# Patient Record
Sex: Male | Born: 1945
Health system: Southern US, Community
[De-identification: ages and names within clinical notes are randomized; demographics above are authoritative.]

## PROBLEM LIST (undated history)

## (undated) DIAGNOSIS — Z8619 Personal history of other infectious and parasitic diseases: Secondary | ICD-10-CM

## (undated) DIAGNOSIS — F429 Obsessive-compulsive disorder, unspecified: Secondary | ICD-10-CM

## (undated) DIAGNOSIS — Z972 Presence of dental prosthetic device (complete) (partial): Secondary | ICD-10-CM

## (undated) DIAGNOSIS — R410 Disorientation, unspecified: Secondary | ICD-10-CM

## (undated) DIAGNOSIS — F329 Major depressive disorder, single episode, unspecified: Secondary | ICD-10-CM

## (undated) DIAGNOSIS — G2581 Restless legs syndrome: Secondary | ICD-10-CM

## (undated) DIAGNOSIS — M1990 Unspecified osteoarthritis, unspecified site: Secondary | ICD-10-CM

## (undated) DIAGNOSIS — F32A Depression, unspecified: Secondary | ICD-10-CM

## (undated) DIAGNOSIS — Z8719 Personal history of other diseases of the digestive system: Secondary | ICD-10-CM

## (undated) DIAGNOSIS — R413 Other amnesia: Secondary | ICD-10-CM

## (undated) DIAGNOSIS — M5126 Other intervertebral disc displacement, lumbar region: Secondary | ICD-10-CM

## (undated) DIAGNOSIS — K219 Gastro-esophageal reflux disease without esophagitis: Secondary | ICD-10-CM

## (undated) DIAGNOSIS — G629 Polyneuropathy, unspecified: Secondary | ICD-10-CM

## (undated) DIAGNOSIS — Z974 Presence of external hearing-aid: Secondary | ICD-10-CM

## (undated) DIAGNOSIS — E785 Hyperlipidemia, unspecified: Secondary | ICD-10-CM

## (undated) DIAGNOSIS — F319 Bipolar disorder, unspecified: Secondary | ICD-10-CM

## (undated) DIAGNOSIS — F29 Unspecified psychosis not due to a substance or known physiological condition: Secondary | ICD-10-CM

## (undated) DIAGNOSIS — J439 Emphysema, unspecified: Secondary | ICD-10-CM

## (undated) HISTORY — PX: ESOPHAGOGASTRODUODENOSCOPY: SHX1529

## (undated) HISTORY — DX: Personal history of other infectious and parasitic diseases: Z86.19

## (undated) HISTORY — PX: OTHER SURGICAL HISTORY: SHX169

## (undated) HISTORY — DX: Gastro-esophageal reflux disease without esophagitis: K21.9

## (undated) HISTORY — DX: Obsessive-compulsive disorder, unspecified: F42.9

## (undated) HISTORY — PX: BACK SURGERY: SHX140

## (undated) HISTORY — DX: Disorientation, unspecified: R41.0

## (undated) HISTORY — DX: Restless legs syndrome: G25.81

## (undated) HISTORY — DX: Unspecified psychosis not due to a substance or known physiological condition: F29

## (undated) HISTORY — DX: Other amnesia: R41.3

## (undated) HISTORY — DX: Polyneuropathy, unspecified: G62.9

## (undated) HISTORY — PX: COLONOSCOPY: SHX174

## (undated) HISTORY — DX: Emphysema, unspecified: J43.9

---
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
## 2000-01-10 DIAGNOSIS — A601 Herpesviral infection of perianal skin and rectum: Secondary | ICD-10-CM | POA: Insufficient documentation

## 2001-08-01 ENCOUNTER — Encounter: Payer: Self-pay | Admitting: Family Medicine

## 2001-08-01 ENCOUNTER — Encounter: Admission: RE | Admit: 2001-08-01 | Discharge: 2001-08-01 | Payer: Self-pay | Admitting: Family Medicine

## 2001-09-13 ENCOUNTER — Ambulatory Visit (HOSPITAL_BASED_OUTPATIENT_CLINIC_OR_DEPARTMENT_OTHER): Admission: RE | Admit: 2001-09-13 | Discharge: 2001-09-13 | Payer: Self-pay | Admitting: Family Medicine

## 2003-01-10 DIAGNOSIS — E785 Hyperlipidemia, unspecified: Secondary | ICD-10-CM | POA: Insufficient documentation

## 2003-01-10 DIAGNOSIS — G2581 Restless legs syndrome: Secondary | ICD-10-CM

## 2003-01-10 HISTORY — DX: Restless legs syndrome: G25.81

## 2003-01-10 HISTORY — PX: LUNG SURGERY: SHX703

## 2003-01-26 ENCOUNTER — Encounter: Admission: RE | Admit: 2003-01-26 | Discharge: 2003-01-26 | Payer: Self-pay | Admitting: Family Medicine

## 2003-01-29 ENCOUNTER — Encounter: Admission: RE | Admit: 2003-01-29 | Discharge: 2003-01-29 | Payer: Self-pay | Admitting: Family Medicine

## 2003-02-06 ENCOUNTER — Ambulatory Visit (HOSPITAL_COMMUNITY): Admission: RE | Admit: 2003-02-06 | Discharge: 2003-02-06 | Payer: Self-pay | Admitting: Internal Medicine

## 2003-02-18 ENCOUNTER — Inpatient Hospital Stay (HOSPITAL_COMMUNITY): Admission: RE | Admit: 2003-02-18 | Discharge: 2003-02-22 | Payer: Self-pay | Admitting: Thoracic Surgery

## 2003-02-18 ENCOUNTER — Encounter (INDEPENDENT_AMBULATORY_CARE_PROVIDER_SITE_OTHER): Payer: Self-pay | Admitting: Specialist

## 2003-03-03 ENCOUNTER — Encounter: Admission: RE | Admit: 2003-03-03 | Discharge: 2003-03-03 | Payer: Self-pay | Admitting: Thoracic Surgery

## 2003-03-24 ENCOUNTER — Encounter: Admission: RE | Admit: 2003-03-24 | Discharge: 2003-03-24 | Payer: Self-pay | Admitting: Thoracic Surgery

## 2003-07-23 ENCOUNTER — Encounter: Admission: RE | Admit: 2003-07-23 | Discharge: 2003-07-23 | Payer: Self-pay | Admitting: Thoracic Surgery

## 2005-09-23 ENCOUNTER — Emergency Department: Payer: Self-pay | Admitting: Unknown Physician Specialty

## 2006-06-14 ENCOUNTER — Inpatient Hospital Stay (HOSPITAL_COMMUNITY): Admission: EM | Admit: 2006-06-14 | Discharge: 2006-06-16 | Payer: Self-pay | Admitting: Emergency Medicine

## 2006-06-15 ENCOUNTER — Encounter (INDEPENDENT_AMBULATORY_CARE_PROVIDER_SITE_OTHER): Payer: Self-pay | Admitting: Neurology

## 2006-06-15 ENCOUNTER — Ambulatory Visit: Payer: Self-pay | Admitting: Vascular Surgery

## 2006-07-05 ENCOUNTER — Ambulatory Visit: Payer: Self-pay | Admitting: Psychiatry

## 2006-07-16 DIAGNOSIS — Z8249 Family history of ischemic heart disease and other diseases of the circulatory system: Secondary | ICD-10-CM | POA: Insufficient documentation

## 2007-12-11 ENCOUNTER — Inpatient Hospital Stay: Payer: Self-pay | Admitting: Psychiatry

## 2008-05-16 ENCOUNTER — Emergency Department (HOSPITAL_COMMUNITY): Admission: EM | Admit: 2008-05-16 | Discharge: 2008-05-17 | Payer: Self-pay | Admitting: Emergency Medicine

## 2008-05-26 ENCOUNTER — Ambulatory Visit: Payer: Self-pay | Admitting: Family Medicine

## 2008-06-24 ENCOUNTER — Encounter (INDEPENDENT_AMBULATORY_CARE_PROVIDER_SITE_OTHER): Payer: Self-pay | Admitting: Internal Medicine

## 2008-06-24 ENCOUNTER — Ambulatory Visit: Payer: Self-pay | Admitting: Internal Medicine

## 2008-06-24 ENCOUNTER — Inpatient Hospital Stay (HOSPITAL_COMMUNITY): Admission: EM | Admit: 2008-06-24 | Discharge: 2008-07-01 | Payer: Self-pay | Admitting: Emergency Medicine

## 2008-07-01 ENCOUNTER — Encounter (INDEPENDENT_AMBULATORY_CARE_PROVIDER_SITE_OTHER): Payer: Self-pay | Admitting: Cardiovascular Disease

## 2008-07-14 DIAGNOSIS — F3012 Manic episode without psychotic symptoms, moderate: Secondary | ICD-10-CM | POA: Insufficient documentation

## 2008-07-19 DIAGNOSIS — I6990 Unspecified sequelae of unspecified cerebrovascular disease: Secondary | ICD-10-CM | POA: Insufficient documentation

## 2009-06-08 DIAGNOSIS — N400 Enlarged prostate without lower urinary tract symptoms: Secondary | ICD-10-CM

## 2009-06-08 HISTORY — DX: Benign prostatic hyperplasia without lower urinary tract symptoms: N40.0

## 2010-04-18 LAB — LIPID PANEL
Cholesterol: 191 mg/dL (ref 0–200)
HDL: 57 mg/dL (ref >39)
LDL Cholesterol: 125 mg/dL — ABNORMAL HIGH (ref 0–99)
Total CHOL/HDL Ratio: 3.4 RATIO
Triglycerides: 45 mg/dL (ref <150)
VLDL: 9 mg/dL (ref 0–40)

## 2010-04-18 LAB — DIFFERENTIAL
Basophils Absolute: 0 10*3/uL (ref 0.0–0.1)
Basophils Relative: 1 % (ref 0–1)
Eosinophils Absolute: 0.1 10*3/uL (ref 0.0–0.7)
Eosinophils Relative: 1 % (ref 0–5)
Lymphocytes Relative: 13 % (ref 12–46)
Lymphs Abs: 1.4 10*3/uL (ref 0.7–4.0)
Monocytes Absolute: 0.8 10*3/uL (ref 0.1–1.0)
Monocytes Relative: 8 % (ref 3–12)
Neutro Abs: 8 10*3/uL — ABNORMAL HIGH (ref 1.7–7.7)
Neutrophils Relative %: 78 % — ABNORMAL HIGH (ref 43–77)

## 2010-04-18 LAB — URINALYSIS, ROUTINE W REFLEX MICROSCOPIC
Glucose, UA: 100 mg/dL — AB
Hgb urine dipstick: NEGATIVE
Ketones, ur: 15 mg/dL — AB
Leukocytes, UA: NEGATIVE
Nitrite: NEGATIVE
Protein, ur: 30 mg/dL — AB
Specific Gravity, Urine: 1.027 (ref 1.005–1.030)
Urobilinogen, UA: 1 mg/dL (ref 0.0–1.0)
pH: 5.5 (ref 5.0–8.0)

## 2010-04-18 LAB — CK TOTAL AND CKMB (NOT AT ARMC)
CK, MB: 14.1 ng/mL — ABNORMAL HIGH (ref 0.3–4.0)
CK, MB: 3 ng/mL (ref 0.3–4.0)
Relative Index: 1.2 (ref 0.0–2.5)
Relative Index: 1.5 (ref 0.0–2.5)
Total CK: 1140 U/L — ABNORMAL HIGH (ref 7–232)
Total CK: 198 U/L (ref 7–232)

## 2010-04-18 LAB — HOMOCYSTEINE: Homocysteine: 12.9 umol/L (ref 4.0–15.4)

## 2010-04-18 LAB — CBC
HCT: 36.9 % — ABNORMAL LOW (ref 39.0–52.0)
HCT: 40.4 % (ref 39.0–52.0)
HCT: 41.1 % (ref 39.0–52.0)
HCT: 41.3 % (ref 39.0–52.0)
HCT: 41.6 % (ref 39.0–52.0)
Hemoglobin: 12.8 g/dL — ABNORMAL LOW (ref 13.0–17.0)
Hemoglobin: 13.9 g/dL (ref 13.0–17.0)
Hemoglobin: 14 g/dL (ref 13.0–17.0)
Hemoglobin: 14.2 g/dL (ref 13.0–17.0)
Hemoglobin: 14.1 g/dL (ref 13.0–17.0)
MCHC: 34.1 g/dL (ref 30.0–36.0)
MCHC: 34.3 g/dL (ref 30.0–36.0)
MCHC: 34.5 g/dL (ref 30.0–36.0)
MCHC: 34.6 g/dL (ref 30.0–36.0)
MCHC: 34 g/dL (ref 30.0–36.0)
MCV: 95.9 fL (ref 78.0–100.0)
MCV: 96.4 fL (ref 78.0–100.0)
MCV: 96.4 fL (ref 78.0–100.0)
MCV: 96.8 fL (ref 78.0–100.0)
MCV: 96.2 fL (ref 78.0–100.0)
Platelets: 122 10*3/uL — ABNORMAL LOW (ref 150–400)
Platelets: 155 10*3/uL (ref 150–400)
Platelets: 167 10*3/uL (ref 150–400)
Platelets: 173 10*3/uL (ref 150–400)
Platelets: 187 10*3/uL (ref 150–400)
RBC: 3.83 MIL/uL — ABNORMAL LOW (ref 4.22–5.81)
RBC: 4.17 MIL/uL — ABNORMAL LOW (ref 4.22–5.81)
RBC: 4.26 MIL/uL (ref 4.22–5.81)
RBC: 4.3 MIL/uL (ref 4.22–5.81)
RBC: 4.32 MIL/uL (ref 4.22–5.81)
RDW: 13.2 % (ref 11.5–15.5)
RDW: 13.3 % (ref 11.5–15.5)
RDW: 13.3 % (ref 11.5–15.5)
RDW: 13.5 % (ref 11.5–15.5)
RDW: 13.3 % (ref 11.5–15.5)
WBC: 10.6 10*3/uL — ABNORMAL HIGH (ref 4.0–10.5)
WBC: 7.2 10*3/uL (ref 4.0–10.5)
WBC: 8.2 10*3/uL (ref 4.0–10.5)
WBC: 9.4 10*3/uL (ref 4.0–10.5)
WBC: 10.3 10*3/uL (ref 4.0–10.5)

## 2010-04-18 LAB — TROPONIN I: Troponin I: 0.01 ng/mL (ref 0.00–0.06)

## 2010-04-18 LAB — BLOOD GAS, ARTERIAL
Acid-Base Excess: 0 mmol/L (ref 0.0–2.0)
Bicarbonate: 25.1 mEq/L — ABNORMAL HIGH (ref 20.0–24.0)
O2 Content: 2 L/min
O2 Saturation: 97.9 %
Patient temperature: 98.6
TCO2: 26.6 mmol/L (ref 0–100)
pCO2 arterial: 48.2 mmHg — ABNORMAL HIGH (ref 35.0–45.0)
pH, Arterial: 7.337 — ABNORMAL LOW (ref 7.350–7.450)
pO2, Arterial: 110 mmHg — ABNORMAL HIGH (ref 80.0–100.0)

## 2010-04-18 LAB — COMPREHENSIVE METABOLIC PANEL
ALT: 15 U/L (ref 0–53)
ALT: 17 U/L (ref 0–53)
ALT: 24 U/L (ref 0–53)
AST: 20 U/L (ref 0–37)
AST: 25 U/L (ref 0–37)
AST: 47 U/L — ABNORMAL HIGH (ref 0–37)
Albumin: 3.4 g/dL — ABNORMAL LOW (ref 3.5–5.2)
Albumin: 3.5 g/dL (ref 3.5–5.2)
Albumin: 3.8 g/dL (ref 3.5–5.2)
Alkaline Phosphatase: 68 U/L (ref 39–117)
Alkaline Phosphatase: 74 U/L (ref 39–117)
Alkaline Phosphatase: 85 U/L (ref 39–117)
BUN: 12 mg/dL (ref 6–23)
BUN: 18 mg/dL (ref 6–23)
BUN: 13 mg/dL (ref 6–23)
CO2: 23 mEq/L (ref 19–32)
CO2: 25 mEq/L (ref 19–32)
CO2: 28 mEq/L (ref 19–32)
Calcium: 9 mg/dL (ref 8.4–10.5)
Calcium: 9 mg/dL (ref 8.4–10.5)
Calcium: 9.2 mg/dL (ref 8.4–10.5)
Chloride: 111 mEq/L (ref 96–112)
Chloride: 112 mEq/L (ref 96–112)
Chloride: 106 mEq/L (ref 96–112)
Creatinine, Ser: 1.12 mg/dL (ref 0.4–1.5)
Creatinine, Ser: 1.17 mg/dL (ref 0.4–1.5)
Creatinine, Ser: 1.14 mg/dL (ref 0.4–1.5)
GFR calc Af Amer: >60 mL/min (ref >60)
GFR calc Af Amer: >60 mL/min (ref >60)
GFR calc Af Amer: >60 mL/min (ref >60)
GFR calc non Af Amer: >60 mL/min (ref >60)
GFR calc non Af Amer: >60 mL/min (ref >60)
GFR calc non Af Amer: >60 mL/min (ref >60)
Glucose, Bld: 76 mg/dL (ref 70–99)
Glucose, Bld: 85 mg/dL (ref 70–99)
Glucose, Bld: 105 mg/dL — ABNORMAL HIGH (ref 70–99)
Potassium: 3.8 mEq/L (ref 3.5–5.1)
Potassium: 4 mEq/L (ref 3.5–5.1)
Potassium: 3.6 mEq/L (ref 3.5–5.1)
Sodium: 146 mEq/L — ABNORMAL HIGH (ref 135–145)
Sodium: 146 mEq/L — ABNORMAL HIGH (ref 135–145)
Sodium: 141 mEq/L (ref 135–145)
Total Bilirubin: 0.8 mg/dL (ref 0.3–1.2)
Total Bilirubin: 1.3 mg/dL — ABNORMAL HIGH (ref 0.3–1.2)
Total Bilirubin: 0.5 mg/dL (ref 0.3–1.2)
Total Protein: 5.9 g/dL — ABNORMAL LOW (ref 6.0–8.3)
Total Protein: 6.3 g/dL (ref 6.0–8.3)
Total Protein: 6.8 g/dL (ref 6.0–8.3)

## 2010-04-18 LAB — BASIC METABOLIC PANEL
BUN: 12 mg/dL (ref 6–23)
BUN: 13 mg/dL (ref 6–23)
CO2: 27 mEq/L (ref 19–32)
CO2: 30 mEq/L (ref 19–32)
Calcium: 8.7 mg/dL (ref 8.4–10.5)
Calcium: 9.2 mg/dL (ref 8.4–10.5)
Chloride: 109 mEq/L (ref 96–112)
Chloride: 109 mEq/L (ref 96–112)
Creatinine, Ser: 0.83 mg/dL (ref 0.4–1.5)
Creatinine, Ser: 0.96 mg/dL (ref 0.4–1.5)
GFR calc Af Amer: >60 mL/min (ref >60)
GFR calc Af Amer: >60 mL/min (ref >60)
GFR calc non Af Amer: >60 mL/min (ref >60)
GFR calc non Af Amer: >60 mL/min (ref >60)
Glucose, Bld: 112 mg/dL — ABNORMAL HIGH (ref 70–99)
Glucose, Bld: 87 mg/dL (ref 70–99)
Potassium: 3.6 mEq/L (ref 3.5–5.1)
Potassium: 3.7 mEq/L (ref 3.5–5.1)
Sodium: 143 mEq/L (ref 135–145)
Sodium: 144 mEq/L (ref 135–145)

## 2010-04-18 LAB — C-REACTIVE PROTEIN
CRP: 0.6 mg/dL — ABNORMAL HIGH (ref <0.6)
CRP: 0.7 mg/dL — ABNORMAL HIGH (ref <0.6)

## 2010-04-18 LAB — DRUG SCREEN PANEL (SERUM)
Amphetamine Scrn: NEGATIVE
Barbiturate Scrn: NEGATIVE
Benzodiazepine Scrn: NEGATIVE
Cannabinoid, Blood: NEGATIVE
Cocaine (Metabolite): NEGATIVE
Methadone (Dolophine), Serum: NEGATIVE
Opiates, Blood: NEGATIVE
Phencyclidine, Serum: NEGATIVE
Propoxyphene,Serum: NEGATIVE

## 2010-04-18 LAB — CARDIAC PANEL(CRET KIN+CKTOT+MB+TROPI)
CK, MB: 4.8 ng/mL — ABNORMAL HIGH (ref 0.3–4.0)
CK, MB: 8.2 ng/mL — ABNORMAL HIGH (ref 0.3–4.0)
Relative Index: 1 (ref 0.0–2.5)
Relative Index: 1.2 (ref 0.0–2.5)
Total CK: 484 U/L — ABNORMAL HIGH (ref 7–232)
Total CK: 692 U/L — ABNORMAL HIGH (ref 7–232)
Troponin I: 0.01 ng/mL (ref 0.00–0.06)
Troponin I: 0.03 ng/mL (ref 0.00–0.06)

## 2010-04-18 LAB — TSH: TSH: 2.033 u[IU]/mL (ref 0.350–4.500)

## 2010-04-18 LAB — VITAMIN B12: Vitamin B-12: 309 pg/mL (ref 211–911)

## 2010-04-18 LAB — ANA: Anti Nuclear Antibody(ANA): NEGATIVE

## 2010-04-18 LAB — RPR: RPR Ser Ql: NONREACTIVE

## 2010-04-18 LAB — MYOGLOBIN, SERUM: Myoglobin: 85 ng/mL (ref <111)

## 2010-04-18 LAB — METHYLMALONIC ACID, SERUM: Methylmalonic Acid, Quantitative: 133

## 2010-04-18 LAB — URINE MICROSCOPIC-ADD ON

## 2010-04-18 LAB — FOLATE: Folate: 14.4 ng/mL

## 2010-04-18 LAB — SEDIMENTATION RATE: Sed Rate: 16 mm/hr (ref 0–16)

## 2010-04-18 LAB — AMMONIA: Ammonia: 13 umol/L (ref 11–35)

## 2010-04-19 LAB — GLUCOSE, CAPILLARY: Glucose-Capillary: 105 mg/dL — ABNORMAL HIGH (ref 70–99)

## 2010-04-19 LAB — URINALYSIS, ROUTINE W REFLEX MICROSCOPIC
Bilirubin Urine: NEGATIVE
Glucose, UA: NEGATIVE mg/dL
Hgb urine dipstick: NEGATIVE
Ketones, ur: NEGATIVE mg/dL
Nitrite: NEGATIVE
Protein, ur: NEGATIVE mg/dL
Specific Gravity, Urine: 1.024 (ref 1.005–1.030)
Urobilinogen, UA: 1 mg/dL (ref 0.0–1.0)
pH: 5.5 (ref 5.0–8.0)

## 2010-04-19 LAB — COMPREHENSIVE METABOLIC PANEL
ALT: 19 U/L (ref 0–53)
AST: 24 U/L (ref 0–37)
Albumin: 4 g/dL (ref 3.5–5.2)
Alkaline Phosphatase: 88 U/L (ref 39–117)
BUN: 13 mg/dL (ref 6–23)
CO2: 25 mEq/L (ref 19–32)
Calcium: 9 mg/dL (ref 8.4–10.5)
Chloride: 108 mEq/L (ref 96–112)
Creatinine, Ser: 1.01 mg/dL (ref 0.4–1.5)
GFR calc Af Amer: >60 mL/min (ref >60)
GFR calc non Af Amer: >60 mL/min (ref >60)
Glucose, Bld: 108 mg/dL — ABNORMAL HIGH (ref 70–99)
Potassium: 4.1 mEq/L (ref 3.5–5.1)
Sodium: 139 mEq/L (ref 135–145)
Total Bilirubin: 0.5 mg/dL (ref 0.3–1.2)
Total Protein: 7.2 g/dL (ref 6.0–8.3)

## 2010-04-19 LAB — DIFFERENTIAL
Basophils Absolute: 0.1 10*3/uL (ref 0.0–0.1)
Basophils Relative: 1 % (ref 0–1)
Eosinophils Absolute: 0.1 10*3/uL (ref 0.0–0.7)
Eosinophils Relative: 1 % (ref 0–5)
Lymphocytes Relative: 16 % (ref 12–46)
Lymphs Abs: 1.6 10*3/uL (ref 0.7–4.0)
Monocytes Absolute: 0.6 10*3/uL (ref 0.1–1.0)
Monocytes Relative: 6 % (ref 3–12)
Neutro Abs: 7.5 10*3/uL (ref 1.7–7.7)
Neutrophils Relative %: 76 % (ref 43–77)

## 2010-04-19 LAB — CBC
HCT: 40.4 % (ref 39.0–52.0)
Hemoglobin: 14.1 g/dL (ref 13.0–17.0)
MCHC: 34.9 g/dL (ref 30.0–36.0)
MCV: 94.1 fL (ref 78.0–100.0)
Platelets: 168 10*3/uL (ref 150–400)
RBC: 4.29 MIL/uL (ref 4.22–5.81)
RDW: 13 % (ref 11.5–15.5)
WBC: 9.9 10*3/uL (ref 4.0–10.5)

## 2010-04-19 LAB — POCT CARDIAC MARKERS
CKMB, poc: 2.6 ng/mL (ref 1.0–8.0)
Myoglobin, poc: 113 ng/mL (ref 12–200)
Troponin i, poc: <0.05 ng/mL (ref 0.00–0.09)

## 2010-05-24 NOTE — Consult Note (Signed)
NAMECRISPIN, [name] NO.:  [id number]   MEDICAL RECORD NO.:  [id number]          PATIENT TYPE:  INP   LOCATION:  1330                         FACILITY:  [facility]   PHYSICIAN:  Marlan Palau, M.D.  DATE OF BIRTH:  24-May-1945   DATE OF CONSULTATION:  DATE OF DISCHARGE:                                 CONSULTATION   HISTORY OF PRESENT ILLNESS:  [name] is a 65 year old left-handed  white male, born [birth year], with a history of restless leg syndrome,  mild peripheral neuropathy.  This patient has been on Requip, of unknown  dose.  The patient apparently was off the medication for several weeks  and just recently re-started it in generic form.  This patient claims he  only took 1 tablet, again milligram strength is unknown.  The patient  apparently began having some problems with agitation, confusion,  apparently was yelling at people, difficult to calm down, wandering.  The patient was admitted to was sent to the emergency room for  clearance, and the patient was admitted for further evaluation.  CT scan  of the head was relatively unremarkable, and MRI scan of the brain was  done showing some mild-to-moderate small-vessel ischemic changes.  No  acute changes were seen.  Neurology was asked see this patient for  further evaluation.  Psychiatry has been called.  The patient has been  treated Haldol with good improvement and taken off Requip.   PAST MEDICAL HISTORY:  1. History of recent psychosis, confusion.  2. History of emphysema.  3. Hyperlipidemia.  4. Restless leg syndrome.  5. Questionable mild peripheral neuropathy.   MEDICATIONS:  At this time include:  1. Ativan if needed.  2. Haldol 5 mg IV q.6 h. if needed.   ALLERGIES:  THE PATIENT HAS NO KNOWN ALLERGIES.   SOCIAL HISTORY:  Smokes a pack of cigarettes a day.  Does not drink  alcohol.   SOCIAL HISTORY:  This patient is divorced, has no children, works as a  [employer].  The patient  lives alone, currently.  The patient lives in  the [place] area.   FAMILY MEDICAL HISTORY:  Mother died with an MI.  Father died with  cancer and pneumonia.  The patient has three sisters who are alive well.  No family history of diabetes is noted.   REVIEW OF SYSTEMS:  Notable for no recent fevers, chills.  The patient  denies headache, neck pain, shortness of breath, chest pain, abdominal  pain, troubles controlling the bowels or bladder.  The patient reports  no new focal numbness or weakness on the face, arms or legs, gait  disturbance, dizziness, blackout episodes.   PHYSICAL EXAMINATION:  VITALS:  Blood pressure is 110/66, heart rate 89,  respiratory rate 16, temperature afebrile.  GENERAL:  This patient is a fairly well-developed white male who is  alert, cooperative at time of the examination.  HEENT:  Head is atraumatic.  Eyes:  Pupils are equal, round and react to  light.  Disks soft, flat bilaterally.  NECK:  Supple.  No carotid bruits noted.  RESPIRATORY:  Examination is clear.  CARDIOVASCULAR:  Examination reveals a regular rate and rhythm.  No  obvious murmurs or rubs noted.  EXTREMITIES:  Without significant edema.  NEUROLOGIC:  Cranial nerves as above.  Facial symmetry is present.  Patient has good sensation of face to pinprick, soft touch bilaterally.  Has good strength, facial muscle, muscles of the head turning, shoulder  shrug bilaterally.  Speech is well enunciated and not aphasic.  Motor  testing shows 5/5 strength in all fours.  Good symmetric motor tone is  noted throughout.  Sensory testing is intact to pinprick, soft touch,  vibratory sensation throughout.  The patient has good finger-nose-  finger, heel-to-shin.  GAIT:  Normal tandem, gait normal.  Romberg negative.  No drift is seen.  Deep tendon reflexes are symmetric, normal toes, neutral to downgoing  bilaterally.  The patient recalls 1 to 3 words at 5 minutes, is fully  oriented to  person, place, date.  The patient is able to form serial  sevens and he can spell the word world backwards.   LABORATORY VALUES:  Notable for a white count of 11.2, hemoglobin of  15.5, hematocrit 44.9, MCV of 91.9, platelets of 227, sodium 142,  potassium 4.3, chloride of 107, CO2 29, glucose of 93, BUN of 17,  creatinine 1.06, total bili 0.7, direct bili of 0.1, indirect 0.6,  phosphatase of 81, SGOT of 20, SGPT of 19, total protein 7.2, albumin of  4.1, calcium 10.3, TSH of 2.1.  Urine drug screen was unremarkable.  Urinalysis revealed a specific gravity of 1.024, pH of 5.5.  RPR was  nonreactive.  B12 level again was normal.  Alcohol level less than five.   MRI scan and the CT scan of the head is as above.   IMPRESSION:  1. History of restless leg syndrome.  2. Psychosis.  3. Small vessel disease by MRI scan of the brain.   The patient has what appears to be chronic non-specific small vessel  changes by MRI scan of the brain.  I have no indication at this is any  way related to his current behavior.  Certainly, the patient had been  off Requip for a period of time and restarted at a relatively high dose,  acute confusional state could have ensued.  Need to pursue a bit further  workup to rule out seizure-type events, any evidence of vasculitis.   PLAN:  1. Check blood work for sed rate and a rheumatoid factor.  2. EEG study.  3. Carotid Doppler study.  The patient should probably be on aspirin      therapy.  Will follow the patient's clinical course while in-house.      Thank you very much.      Marlan Palau, M.D.  Electronically Signed     CKW/MEDQ  D:  06/15/2006  T:  06/15/2006  Job:  [id number]   cc:   L. Lupe Carney, M.D.  Fax: [phone]   [facility]  [address] [place]  [address]

## 2010-05-24 NOTE — H&P (Signed)
NAMEIZEKIEL, FLEGEL NO.:  [id number]   MEDICAL RECORD NO.:  [id number]           PATIENT TYPE:   LOCATION:                                 FACILITY:   PHYSICIAN:  Hollice Espy, M.D.DATE OF BIRTH:  October 26, 1945   DATE OF ADMISSION:  06/14/2006  DATE OF DISCHARGE:                              HISTORY & PHYSICAL   PCP:  Dr. Asencion Gowda of [facility].   CHIEF COMPLAINT:  Confusion.   HISTORY OF PRESENT ILLNESS:  The patient is a 65 year old white male  with a past medical history which includes hyperlipidemia and emphysema,  who also has a history of peripheral neuropathy, and was started several  months ago on if not longer on Requip.  Please note that the history is  obtained by the family, the patient's sisters, and the patient is unable  to give me any kind of history.  Reportedly he was started on Requip.  He has been noncompliant with medication after running out from his  prescriptions, but his legs can cause such severe pain that apparently  at some point he had started to retake it in the last 1-2 days.  The  family was concerned that with such severe leg pain he may have taken  possibly more than the prescribed dosing.  The patient started acting  quite hostile and aggressive, which they said was very different for  him.  Although there is no reported history of psychiatry issues, the  family tells me that he has in the past been quite hyper.  I do not know  if this translates into him being manic or not; however, in the last 2  days he has had a difference, a change in his behavior where he is much  more aggressive and hostile, yelling at people and acting out.  They  became concerned, and wanted to get help for the patient.  They were  unable to contact his PCP today, and they contacted his pharmacist to  ask if restarting this medication of Requip may be a contributing  factor, may be a cause for hallucinations, which the pharmacist  said  that there is a possibility of this.  The patient at some point then  walked away from his family's house, and was later found on the street  by the police.  The family then at that point signed commitment papers.  He was brought in to the emergency room for medical clearance, and the  ER attending was concerned about the possibility that this was maybe  more of a medical cause rather than a psychiatry cause, and asked [facility]  hospitalists to further evaluate the patient.  The patient received  Ativan in the emergency room, and is currently sleeping comfortably.  He  is stable, but he is unable to give me any kind of history.  Again this  history is all obtained from his family.   The patient's past medical history includes:  1. A history of emphysema.  2. Hyperlipidemia.  3. Restless leg syndrome.   MEDICATIONS:  He is on Requip.  I  do not know if he is on anything else.   HE HAS NO KNOWN DRUG ALLERGIES.   SOCIAL HISTORY:  No reported drug or heavy alcohol use.  He does  reportedly at times smoke cigarettes.   Family history is noncontributory.   PHYSICAL EXAMINATION:  PATIENT VITALS:  On admission, temperature 98.5,  heart rate initially 117, since then it has settled down to 68, blood  pressure initially 139/104, since then it has settled down to 100/72,  respirations 24, O2 saturation 96% on room air.  GENERAL:  The patient is sleeping.  I am unable to get any kind of  orientation status on him.  HEENT:  Normocephalic and atraumatic.  His mucous membranes are slightly  dry.  He has no carotid bruits.  HEART:  Regular rate and rhythm.  S1 and S2.  A 2/6 systolic ejection  murmur.  LUNGS:  Decreased breath sounds throughout.  ABDOMEN:  Soft, nontender and nondistended.  Positive bowel sounds.  EXTREMITIES:  Show no clubbing, cyanosis or edema.  NEUROLOGICAL/MUSCULOSKELETAL:  I am unable to do much of a neurological  or musculoskeletal examination on him secondary to his  sedation and  compliance.   LABORATORIES:  Sodium 142, potassium 5, chloride 112, bicarbonate 29,  BUN 19, creatinine 1.4, glucose 131.  White count 11.2, H&H 15.5 and 45,  MCV is 92, platelet count 227, no shift.  Urine drug shows negative.  Alcohol level is less than 5.  UA is clear.  LFTs are unremarkable.  I  have ordered an RPR, B12, folate and TSH, all of which are pending.  A  CT scan of the head is unremarkable as well.   ASSESSMENT AND PLAN:  1. Altered mental status.  Given the acuity of the symptoms and no      signs of infection, drug use, it is a strong possibility that this      may be medication related, especially when reviewing the literature      Requip can cause hallucinations.  We plan to observe the patient      with a sitter, as-needed Haldol.  I have asked psychiatry for a      formal evaluation on their part.  I am also waiting for outstanding      laboratories, including TSH, RPR and B12 and folate to return back.      If these are all negative, then possibly again this could be      medicine related.  2. A history of emphysema.  This is stable.      Hollice Espy, M.D.  Electronically Signed     SKK/MEDQ  D:  06/14/2006  T:  06/14/2006  Job:  [id number]   cc:   Antonietta Breach, M.D.   Elsworth Soho, M.D.  Fax: [phone]

## 2010-05-24 NOTE — Group Therapy Note (Signed)
[name], [name] NO.:  [id number]   MEDICAL RECORD NO.:  [id number]          PATIENT TYPE:  INP   LOCATION:  3019                         FACILITY:  [facility]   PHYSICIAN:  Lonia Blood, M.D.DATE OF BIRTH:  Mar 13, 1945                                 PROGRESS NOTE   ACTIVE DIAGNOSES:  1. Delirium/fluctuating mental status.      a.     No clear anatomical or physiologic etiology.      b.     Metabolic evaluation unrevealing with the exception to       borderline B12 - MMA pending.      c.     MRI and MRA unrevealing for acute lesions that would explain       the patient's symptoms.      d.     Psychiatry following.      e.     Appears to have responded well to schedule low dose Ativan       and Seroquel at bedtime.  2. Small vessel type punctate brainstem cerebrovascular accidents x2.      a.     Incidental finding on MRI/MRA.      b.     No clinical symptoms associated with.      c.     Aspirin plus Zocor therapy initiated.  3. Right internal carotid artery origin possible ruptured plaques.      a.     No evidence of stenosis via carotid Dopplers.      b.     Not deemed to be an appropriate candidate for       anticoagulation due to significant risk of bleeding.  4. Hyperlipidemia - medical treatment started with normal liver      function tests.  5. Anxiety disorder - scheduled Ativan initiated.  6. Tobacco abuse - I counseled to discontinue.  7. Hypertension - presently controlled.  8. Borderline B12 - MMA pending.   Discharge medications - to be determined at time of discharge.   PROCEDURES:  1. EEG June 26, 2008 - normal EEG.  2. MRI and MRA of the brain in June 24, 2008 - two punctate foci of      acute or subacute infarction in the brainstem.  Chronic small      vessel type changes within hemispheric white matter progressive      since 2008.  Normal intracranial MR angiography.  3. CT scan of the head June 23, 2008 - no acute intracranial  abnormality.  Chronic small vessel white matter disease.  4. Modified barium swallow June 26 1008 - cleared for regular diet.   CONSULTATIONS:  1. Dr. Rico Junker with psychiatry.  2. [facility].   HOSPITAL COURSE:  [name] is a very pleasant 65 year old gentleman who was  admitted to the acute unit on June 24, 2008 with acute aphasia.  Of  note, the patient has had previous episodes in the past of significant  altered mental status.  At this time, however, the patient was alert and  oriented but was displaying classic significant aphasia.  CT scan of the  head was accomplished and failed to reveal any evidence of an acute CVA.  There was concern, however, that the patient's symptoms were consistent  with an acute CVA.  As a result, a full CVA evaluation was carried out  to include MRI/MRA, carotid Dopplers, and transthoracic echocardiogram.  The transthoracic echocardiogram was unrevealing.  It did confirm normal  systolic function.  There were no appreciable wall motion abnormalities.  MRI and MRA revealed punctate small vessel type acute versus subacute  brainstem CVAs but these were not within an area which clinically could  explain the patient's symptoms.  As a result, there was no evidence that  a stroke had caused the patient's symptoms of.  At the present time, we  are currently investigating further potential etiologies.  Psychiatry  has been involved.  There is concern of a possible underlying  psychiatric disorder.  Metabolic evaluation has been unrevealing with  exception of borderline B12 level.  Pending MMA assessment, B12 has been  provided empirically.  We will continue to follow the patient during his  hospital stay and anticipate that he will be cleared for discharge when  his mental status has improved and he is proven to be safe to ambulate.      Lonia Blood, M.D.  Electronically Signed     JTM/MEDQ  D:  06/29/2008  T:   06/29/2008  Job:  [id number]

## 2010-05-24 NOTE — Procedures (Signed)
EEG NUMBER:  [id number].   HISTORY:  This is a 65 year old patient with a history of gait ataxia,  aphasia, altered mental status.  The patient is being evaluated for the  above events.  This is a portable EEG recording.  No skull defects are  noted.   EEG CLASSIFICATION:  Normal weight.   DESCRIPTION OF THE RECORDING:  Background rhythm of this recording  consists of a fairly well-modulated medium amplitude alpha rhythm of 10  Hz that is reactive to eye open and closure.  As the record progresses,  the patient appears to remain in the waking state.  Photic stimulation  and hyperventilation were not performed.  At no time during the  recording, there appear to be evidence of spike, spike wave discharges,  or evidence of focal slowing.  EKG monitor shows no evidence of cardiac  rhythm abnormalities with a heart rate of 60.   IMPRESSION:  This is a normal EEG recording in the waking state.  No  evidence of ictal or interictal discharges were seen.      Marlan Palau, M.D.  Electronically Signed     EAV:WUJW  D:  06/26/2008 15:36:41  T:  06/27/2008 07:46:12  Job #:  [id number]

## 2010-05-24 NOTE — H&P (Signed)
NAMEKMARION, RAWL NO.:  [id number]   MEDICAL RECORD NO.:  [id number]          PATIENT TYPE:  INP   LOCATION:  6742                         FACILITY:  [facility]   PHYSICIAN:  Della Goo, M.D. DATE OF BIRTH:  04/24/1945   DATE OF ADMISSION:  06/24/2008  DATE OF DISCHARGE:                              HISTORY & PHYSICAL   PRIMARY CARE PHYSICIAN:  Unassigned.   CHIEF COMPLAINT:  Difficulty speaking.   HISTORY OF PRESENT ILLNESS:  This is a 65 year old male who was brought  to the emergency department by his family secondary to complaints of  difficulty speaking over the past 3 days.  The patient is unable to give  a history secondary to symptoms that have been described as hesitancy  and nonsensical speech.  The patient's sister is at the bedside and  gives the history and reports that 3 days ago the patient was noticed to  have difficulty speaking and to be speaking in a very nonsensical  pattern.  She also reports that he had almost stopped speaking  completely.  She reports these symptoms worsened over the past 24 hours,  and the patient has also become more lethargic.  The patient nods and  denies having any headache, chest pain or shortness of breath.  He is  able to follow simple commands.   The patient had had two previous episodes of altered mental status over  the past 2 years and these episodes resolved.  The patient also  reportedly has had a recent MRI within the past 2 weeks and they were to  see a neurologist with the [facility] on Monday,  June 21.  EKG performed and this result revealed a normal sinus rhythm  with multiple PVCs.   PAST MEDICAL HISTORY:  1. Hyperlipidemia.  2. Restless leg syndrome.  3. Anxiety.   PAST SURGICAL HISTORY:  History of a right lung resection with benign  findings in 2005.   MEDICATIONS:  Xanax 1 mg tablets.  The patient takes one half tablet  p.o. b.i.d. and one whole tablet q.h.s.   ALLERGIES:  NO KNOWN DRUG ALLERGIES.   SOCIAL HISTORY:  The patient is a smoker, smokes a half-a-pack of  cigarettes daily for many years.  He is an occasional drinker, and he  denies any history of illicit drug usage.   FAMILY HISTORY:  Positive for coronary artery disease in his mother,  positive for hypertension in his mother and one sister, positive for  diabetes in his sister and in two maternal aunts.  Positive for cancer  in his father who had lung cancer and was a smoker.   REVIEW OF SYSTEMS:  Pertinents are mentioned above.  All other organ  systems are negative.  The patient's family denied that he has had any  difficulty in ambulating or any evidence of weakness in any of his  limbs.  However, they do report that he has had some difficulty walking  with an unsteady gait.   PHYSICAL EXAMINATION FINDINGS:  GENERAL:  This is a 65 year old thin,  well-developed male in discomfort but no acute  distress.  VITAL SIGNS: Temperature 97.4, blood pressure 167/94, heart rate 91,  respirations 20, O2 sats 95-97%.  HEENT:  Examination normocephalic, atraumatic.  There is no scleral  icterus.  Pupils are equally round reactive to light.  Extraocular  movements are intact.  Funduscopic benign.  Nares are patent  bilaterally.  Oropharynx is clear.  There is no tongue deviation on  examination.  NECK:  Supple full range of motion.  No thyromegaly, adenopathy, jugular  venous distention.  CARDIOVASCULAR:  Regular rate and rhythm with occasional ectopic beats  heard.  LUNGS:  Clear to auscultation bilaterally.  ABDOMEN:  Positive bowel sounds, soft, nontender, nondistended.  EXTREMITIES:  Without cyanosis, clubbing or edema.  NEUROLOGIC:  The patient is alert and oriented times one, unable to  assess fully his orientation to place or time secondary to his  difficulty with his speech.  His speech is nonsensical and mildly  dysarthric.  He also has hesitancy.  It is apparent the patient is   highly frustrated with his speech.  There is no pronator drifting of his  upper extremities.  The patient is able to move all fours extremities.  Babinski sign is equivocal in both feet.   LABORATORY STUDIES:  White blood cell count 10.3, hemoglobin 14.1,  hematocrit 41.6, platelets 187, neutrophils 78% lymphocytes 13%.  Sodium  141, potassium 2.6, chloride 106, carbon dioxide 28, BUN 13, creatinine  1.14, glucose 105, albumin 3.8, AST 47, ALT 24.  Urinalysis was a urine  glucose of 100.  CT scan of the head performed, negative for any acute  intracranial abnormality, chronic small vessel white matter disease  changes are seen.   ASSESSMENT:  A 65 year old male being admitted with:  1. Expressive aphasia.  2. Ataxia.  3. Altered mental status.  4. Hypertension.   PLAN:  The patient will be admitted to telemetry area and a CVA workup  will be started.  The patient will be sent for an MRI/MRA study of the  brain, also a carotid ultrasound study will be ordered and 2-D echo with  Doppler study.  The patient will be placed on neurologic checks, and a  neurologic consultation will be requested.  The patient is n.p.o. for  now, and a speech swallowing evaluation will be ordered.  Further workup  will ensue pending results of the patient's clinical course and results  of his studies.  The patient will also be placed on DVT and GI  prophylaxis.      Della Goo, M.D.  Electronically Signed     HJ/MEDQ  D:  06/24/2008  T:  06/24/2008  Job:  [id number]

## 2010-05-24 NOTE — Discharge Summary (Signed)
[name], [name]              ACCOUNT NO.:  [id number]   MEDICAL RECORD NO.:  [id number]          PATIENT TYPE:  INP   LOCATION:  3019                         FACILITY:  [facility]   PHYSICIAN:  Beckey Rutter, MD  DATE OF BIRTH:  [birth year]   DATE OF ADMISSION:  06/23/2008  DATE OF DISCHARGE:  07/01/2008                               DISCHARGE SUMMARY   ADDENDUM   PRIMARY CARE PHYSICIAN:  Unassigned.   Please amend this discharge summary to the previously dictated discharge  summary as a progress note on June 29, 2008, by Dr. Jetty Duhamel.   ACTIVE DIAGNOSIS AND DISCHARGE DIAGNOSIS:  Please refer to the  previously dictated discharge summary for discharge diagnosis which is  dictated as active diagnosis.   DISCHARGE MEDICATIONS:  1. Aspirin 325 mg.  2. Ativan 0.5 mg p.o. q.8 h.  3. Lopressor 100 mg p.o. b.i.d.  4. Seroquel 50 mg p.o. at bedtime.  5. Zocor 40 mg p.o. at night.   The patient had TEE today with the result negative.  He is stable for  discharge today.  Thank you very much.      Beckey Rutter, MD  Electronically Signed     EME/MEDQ  D:  07/01/2008  T:  07/02/2008  Job:  [id number]

## 2010-05-24 NOTE — Consult Note (Signed)
NAMEARISTOTELIS, VILARDI NO.:  [id number]   MEDICAL RECORD NO.:  [id number]          PATIENT TYPE:  INP   LOCATION:  1330                         FACILITY:  [facility]   PHYSICIAN:  Antonietta Breach, M.D.  DATE OF BIRTH:  Jul 03, 1945   DATE OF CONSULTATION:  06/14/2006  DATE OF DISCHARGE:                                 CONSULTATION   REQUESTING PHYSICIAN:  Corinna L. Lendell Caprice, M.D.   REASON FOR CONSULTATION:  Psychosis.   HISTORY OF PRESENT ILLNESS:  Mr. Desjuan Stearns is a 65 year old male  admitted to the [facility] on June 14, 2006 due to confusion  and agitation.   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  While on the trip, he did not sleep much.  He drank a  significant amount of caffeine.  He also states that he was quite hot  and likely had some dehydration.  He also was taking Requip for his  restless legs syndrome.   The family was concerned that he might have taken more Requip than is  prescribed.  The patient does not recall taking more than just one  tablet.  The family called and stated that of the bottle that was  recently filled, there was only one Requip tablet left.   After returning from his truck-driving trip, he was behaving in an  uncharacteristic manner.  He was highly angry and hostile.  He did not  hurt anyone, but he was verbally very insulting, which was quite out of  his character.  He also trashed his TV and was yelling at people, which  again is not in his character.   At one point, he walked away from his house.  The family contacted the  police due to the change in his mental status.  They took out commitment  papers.  The patient was brought into the emergency room.   Since arriving to [facility], the patient was sedated in the  emergency room.  After arriving to the general medical ward, he has  recovered his orientation as well as his ability to store memory.  He  has no thoughts of harming others or himself.  He does not display any  delusions or hallucinations; however, he did become very angry and  verbally offensive when his family came to visit him.  He told them that  he did not want them in his life trying to control him.  At least one of  them left in tears.   The patient has described to the undersigned how he has been concerned  for some time that his family does not approve of him being attached to  a certain woman.  He has been annoyed for some time that his family tell  him what to do and do not approve of his social activity.  He has felt a  certain obligation about taking care of his niece, who is disabled.  He  had promised his parents that he would help take care of her.  He  describes a  conflict in his mind that has been going on for some time  regarding feeling guilty about not being able to provide care for his  niece to the level that is required if he becomes involved with a  girlfriend.   As mentioned, when the family has not been present today, the patient  has normal behavior and interests.  He enjoys discussing his horses and  constructive goals for the future.   PAST PSYCHIATRIC HISTORY:  The patient denies any history of mood  symptoms.  He denies major depression.  He denies any history of  hallucinations or delusions.  He also denies having been treated with  psychotropic medication other than the Requip for restless legs  syndrome.   FAMILY PSYCHIATRIC HISTORY:  None known.   SOCIAL HISTORY:  Mr. Trapani is divorced.  His occupation, truck Hospital doctor.  Education:  Post high school.  He denies any alcohol use.  He also  denies any illegal drug use.  He does state that both his smoking and  caffeine intake do increase when he goes on truck driving trips.   The patient has one sibling, a sister, who lives in the area.  He has no  children.  He normally smokes about a pack and a half of cigarettes a  day.   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  COPD.  Hyperlipidemia.  Delirium.  Restless legs syndrome.   MEDICATIONS:  The MAR is reviewed.  The patient is on Haldol 5 mg IV  q.6h. p.r.n.  He did receive 5 mg of Haldol this morning around 8 a.m.   Folic acid within normal limits.  B12 within normal limits.  TSH within  normal limits.  RPR nonreactive.  Hepatic function panel completely  normal.  Urine drug screen, no abnormalities.  Urinalysis is  unremarkable.  Basic metabolic panel was unremarkable except for a  mildly elevated glucose at 131.  Alcohol was negative.  WBC was slightly  elevated at 11.2, hemoglobin 15.5, platelet count 227.   Head CT without contrast showed nonspecific white matter lucencies in  the frontal lobes, most likely small vessel ischemic disease.   REVIEW OF SYSTEMS:  CONSTITUTIONAL:  Afebrile.  HEAD:  No trauma.  EYES:  No visual changes.  EARS:  No hearing impairment.  NOSE:  No rhinorrhea.  MOUTH/THROAT:  No sore throat.  NEUROLOGIC:  As above.  PSYCHIATRIC:  As  above.  CARDIOVASCULAR:  No chest pain, palpitations, or edema.  RESPIRATORY:  No coughing or wheezing.  GASTROINTESTINAL:  No nausea,  vomiting, diarrhea.  GENITOURINARY:  No dysuria.  SKIN:  Unremarkable.  MUSCULOSKELETAL:  No deformities.  ENDOCRINE/METABOLIC:  Unremarkable.  HEMATOLOGIC/LYMPHATIC:  Unremarkable.   PHYSICAL EXAMINATION:  VITAL SIGNS:  Temperature afebrile.  Pulse 78,  respirations 20, blood pressure 105/80, O2 saturation on room air 99%.  MENTAL STATUS:  Mr. Pinzon is an alert, middle-aged male sitting up in  his hospital bed in no apparent distress.  He is oriented to the year,  the month, the day of the month, day of the week, place, and person.  Memory is intact to immediate, recent, and remote.  He states that he did not have a blackout for any of the abnormal behavioral periods.  His  fund of knowledge and  intelligence are within normal limits.  His speech  involves normal rate and prosody.  Concentration is within normal  limits.  Thought process is logical, coherent, goal-directed.  No  looseness of association.  Thought content:  No thoughts of harming  himself.  No thoughts of harming others. No delusions.  No  hallucinations.  Insight is partial in that he recognizes the  possibility of the various factors and circumstances of Requip,  insomnia, caffeine, and possibly some element of over-heating and  dehydration could have contributed to his mental status change.  His  judgment is grossly intact at this time.  He is cooperative with the  interview.   ASSESSMENT:   AXIS I:  293.00.  Delirium, not otherwise specified, improved.   AXIS II:  Deferred.   AXIS III:  See general medical problems.   AXIS IV:  General medical primary support group.   AXIS V:  1.   Mr. Toro is showing great improvement.   Given the patient's acute change in behavior and the above acute factors  that are correlated with his change, it does appear that the CT scan  findings are unlikely to be involved etiologically.  However, the fact  that the findings are identified in the frontal lobes along with the  fact that the patient has shown periodic disinhibition, it is possible  that there is a frontal lobe process that has been slowly developing and  could be a predisposition for the patient losing his inhibitions.   Therefore, one might consider a cranial MRI for a better soft tissue  exam of his frontal lobes.   RECOMMENDATIONS:  1. See the above discussion.  2. If Mr. Schnitzer continues to display normal behavior, cognition, and      lack of destructive thoughts, threats, as well as lack of      hallucinationss, delusions, memory dysfunction, or disorientation,      he will not require any psychiatric followup and will not be      committable.  He has agreed to a further day of observation,  given      that delirium can be cyclic in nature.  3. Given that his psychosis has resolved, concur with not using a      standing antipsychotic unless the delirium symptoms return, then      would start a standing dose of Haldol 1 mg t.i.d., checking the QTC      to make sure that it is less than 500 ms.      Antonietta Breach, M.D.  Electronically Signed     JW/MEDQ  D:  06/14/2006  T:  06/14/2006  Job:  [id number]

## 2010-05-24 NOTE — Consult Note (Signed)
NAMETYQUEZ, HOLLIBAUGH NO.:  [id number]   MEDICAL RECORD NO.:  [id number]          PATIENT TYPE:  INP   LOCATION:  3019                         FACILITY:  [facility]   PHYSICIAN:  Melvyn Novas, M.D.  DATE OF BIRTH:  09/28/1945   DATE OF CONSULTATION:  06/25/2008  DATE OF DISCHARGE:                                 CONSULTATION   CONSULT IS REQUESTED BY:  Linus Orn, RN, BSN, CNRN   This 65 year old male patient was admitted yesterday in the early  morning hours on June 16 with acute mental status changes, which have  turned out to be at least 4 days present on further questioning.  Yesterday, he was still able to communicate with the ER physician,  followed simple commands, and also he perseverated he had less speech  difficulties.  This morning, he is no longer able to follow commands, and he is aphasic  or so dysarthric that there is literally no verbal output present any  more.  The patient's sisters were contacting Cumby Sink Mintmeier on the floor as she  rounded, and requested a Neurology consultation.   They have not contacted the attending physician upon admission, Dr.  Glade Lloyd.  Rita contacted Dr. Glade Lloyd, who then gave the green light to go  ahead with a what she called stroke consultation.  So, this gentleman presents now without any verbal output with eye-  opening difficulties.  Initially, he had complained about headaches, but  he can now verbalize no longer any review of systems and symptoms.  He  has been seen by his PCP in [place] twice before for mental status  changes that supposingly resolved by themselves.  These were described by the his sisters as episodes of spending money  that he did not have, at one time he trashed or brougth his house into  severe disarray, and he had sleep and appetite changes.  His primary  care physician had arranged for this patient to see a psychiatrist in  consultation in [name].  It is unclear to me if he  actually saw the  psychiatrist, and the patient's next of kin are 2 sisters, who were very  adamant that the psychiatry is not the place their brother needs care  from.    It appears that a followup appointment was not made with the  psychiatrist and that the Neurology appointment for Monday with Dr.  Anne Hahn had yesterday been cancelled and they informed that [facility] office of  his admission.  I told them that since it is still 5 days to his appointment, we should  keep the appointment.  He can still cancel it tomorrow afternoon if  necessary, but I would like for this patient to have a followup  appointment with Neurology within the next 14 days.   The patient presents with a past medical history of mental status  changes x2 that appeared to be manic episodes, GERD, anxiety, has a  history of restless legs syndrome.  He is a smoker and has  hyperlipidemia.   The only medication he was supposingly on was Xanax.  There was no  aspirin and no antidepressant or antipsychotic on the list of  medications, but this may not be a complete list.   Again, the patient's sisters are rather agitated and angry, but they are  not very helpful in obtaining information.  They did explain to Kenton Sink  that the behavior changes concerning were happening at the first time,  almost a year ago and then a second time around Christmas.  These were  times when he spent extraordinary amount of money and trashed his house.  He had pressured speech or spoke in a weird way during these episodes.  Again, this may be a symptom of psychosis.  This may be a manic  exacerbation.  Here, the patient is resting in bed.   His eyes tightly closed.  His jaw is clenched.  He keeps his arms flexed  at the elbow.  He is in an embryonal position.  His overall muscle tone is increased  and I think woefully so.  He does not open his mouth initially when requested, but slowly during  the exam, we were able to go a little step further,  and he finally kept  his eyes open after they were passively opened for him.   VITAL SIGNS:  97.9 degrees Fahrenheit, respiratory rate 20 a minute,  blood pressure 118/67, he is 98% on room air saturated.  LUNGS:  Clear to auscultation.  HEART:  I hear a regular heartbeat  ABDOMEN:  Soft.  EXTREMITIES:  He has no peripheral edema, clubbing, cyanosis, or  bruising.  He has brisk deep tendon reflexes, but no clonus and he has downgoing  toes to plantar stimulation.  The carotid arteries do not show a bruit.  He is very sluggish.  NEUROLOGIC:  Closes spontaneously, his eyes not, but follows simple  motor commands such as providing grip strength when I place to fingers  in his palms and these are equal grip strength.  He spreads his fingers  to command, always closed eyes.  He was able to stick out the tongue  finally, which appeared to be tremulous, but not fasciculating, and he  was not able to open his jaw very much.  Overall, he seems to have trouble to tolerate the ophthalmoscopic exam.  He turned his eyes upwards, closed his eyelids.  His pupils were of  equal size and seemed to be sluggishly reacting to light.  His left eye  deviated upwards when I tried to perform an ophthalmoscopic exam and he  kept his right eye open after this, but his left eyes closed again.  Again, there is no verbal output.  He did not appropriately when asked  about his place of residence and given several options and also when  asked about his name given several options.  Cranial nerve examination  shows no facial asymmetry.  The nasolabial fold seems to be symmetric.  There is no spontaneous eyelid opening and it seems that the patient  clams his left eye shut.  Again, there is an increased tone over the  jaw.  Motor examination shows overall an elevated tone without  fasciculations.  No tremor.  Deep tendon reflexes were brisk, but no  clonus is noted, downgoing toes to plantar stimulation.  He seems  to  respond to primary modalities as much as we can tell in an nonverbal  patient.  He does not nod to questions about left and right and may not  be able to differentiate this.  Gait has to be deferred.  The patient is currently receiving IV fluids, but there is no medication  hanging.  I looked through E-chart and found that he had an MRI 14 days ago in  [place] arranged by primary care physician that remained unnamed in  his chart.    There is no copy available.    The sisters believes that this was a negative study.  Then, here yesterday, another MRI was performed showing two, very small,  small recent brainstem infarctions.  On the carotid Doppler study, the  right ICA has a high-grade plaque - but the degree of flow restriction  needs to be specified.  I also looked through his laboratory results.  His metabolic panel shows  borderline hypernatremia 146 mEq, potassium of 3.8, chloride 111, CO2 is  25, glucose 85, BUN is 12, and creatinine is 1.1.  GOT is 25, GPT is 17,  total albumin is low at 3.4, calcium is 9 within normal range, and GFR  is 60 or higher.  CBC without differential shows a white blood cell  count of 9.4, H and H of 13.9/14.4 and platelet count of 167.  Ammonia  was 13 in normal range.  Blood gases show a CO2 retention at the time of  last night.  I do not know when the blood gas and for what reason it was  obtained, pO2 was 110 and elevated.  Bicarbonate was 25 elevated, CO2  was 48 mmHg elevated, and blood gas was 7.3.  Cardiac panel; creatinine  kinase was 484 and is high, CK-MB is 4.8, but troponin is negative.  This could be a result of injuries or falls at home.  Also, I cannot  find evidence of peripheral bruising.   I have also entertained the possibility that this patient may have  responded to a neuroleptic medication.  The initial creatinine kinase performed at [facility] on June  16 was on 692, CK-MB 8.2, relative index was still 1.2  and normal, and  TSH is 2.0.   I would like to add that this patient presented to the [facility] in May 2010 to the ER and was evaluated by cardiac markers for  possible chest pain.  He presented to the [facility] ER on June 15 at 8  p.m. and was transferred at midnight in the night from 15-16 to [facility].  Again, a Neurology consult had never been called.  It  was [employer], who recruited me today.   I will add for this gentleman that he should indeed have his psychiatric  records find out and that the need to follow up with Psychiatry if this  is a manifestation of a psychosis.  In spite of the abnormalities found  on the MRI, I do think that the 2 very small brain stem strokes are not  related to his mainly cortical manifestations and mental status changes,  so I do believe that there is a second disorder present.  Since his 2-D echo was normal, but he have brainstem small strokes, and  a possible mobile clot in the right carotid artery, I will still ask for  a TEE, which would be performed tomorrow if the patient is n.p.o. after  midnight.  I will order a hypercoagulability panel and vasculitis panel today.  I urged the patient's family to keep the Monday appointment with Dr.  Anne Hahn.  Should he be still in the hospital over the weekend, we will  have our colleague, Dr.  Roseanne Reno inform the office and cancel it.   The patient who did in the past not have a high fall risk, may benefit  from being changed to Coumadin to bridge over for the resolution of the  possible mobile plaque.This is based on preliminary reports and needs to  be clarified as to flow degree and stenosis degree.  Again, the TEE is  ordered for tomorrow.       Melvyn Novas, M.D.  Electronically Signed     CD/MEDQ  D:  06/25/2008  T:  06/26/2008  Job:  [id number]   cc:   Marlan Palau, M.D.

## 2010-05-24 NOTE — Consult Note (Signed)
[name], [name] NO.:  [id number]   MEDICAL RECORD NO.:  [id number]          PATIENT TYPE:  INP   LOCATION:  3019                         FACILITY:  [facility]   PHYSICIAN:  Antonietta Breach, M.D.  DATE OF BIRTH:  March 19, 1945   DATE OF CONSULTATION:  06/26/2008  DATE OF DISCHARGE:                                 CONSULTATION   REASON FOR CONSULTATION:  Psychosis, agitation.   REQUESTING PHYSICIAN:  [facility].   HISTORY OF PRESENT ILLNESS:  Mr. [name] is a 65 year old male  admitted to the [facility] on June 23, 2008 for ataxia and aphasia.   [name] began to experience abnormal mental status symptoms  approximately 4 days prior to admission.  He has had perseveration as  well as confusion.  He was not able to follow commands and was  stuttering.  He also was having difficulty with memory.   By the day of the undersigned's examination Mr. Rise has intact  reasoning.  Also his memory function has returned.  He is oriented to  all spheres and can easily recognize his sister.  He describes  constructive future goals and interests.  He is also able to write and  follow normal commands.  He is not having any hallucinations or  delusions.  He has no thoughts of harming himself or others.  He still  does have occasional word-finding difficulty and occasional stuttering.   PAST PSYCHIATRIC HISTORY:  [name] has exhibited periods in the past  that have lasted a number of days involving increased energy, decreased  sleep, spending excessive money, driving excessively as well as violence  to inanimate objects.   The neurologist noted that in his history he has been seen by his  primary care physician with mental status change episodes that resolved  on their own.  She also obtained history from the sisters describing  episodes of spending money that he did not have and an episode of  trashing his house into disarray.  His sister  emphasizes that this  current episode was very different from previous episodes.   The past medical record is reviewed and in 2008, [name] experienced  some agitation with confusion in June of 2008.  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  He noted he had been  drinking a lot of caffeine and had been taking Requip for restless legs.  He had been very angry and hostile.  He had destroyed his TV and was  uncharacteristically yelling at people.  His family had to take out  commitment papers for him at that time in 2008.  While in the hospital  [name] mental status abnormalities resolved.   FAMILY PSYCHIATRIC HISTORY:  None known.   SOCIAL HISTORY:  Naval architect.  Education:  Some college.  He does not  use alcohol or illegal drugs.  His sister Danella Sensing is looking in on him in  the hospital.  Marital status:  Divorced.  He lives by himself.   PAST MEDICAL HISTORY:  1. Chronic obstructive pulmonary  disease.  2. Restless legs syndrome.  3. History of benign lung tumor removed.   ALLERGIES:  No known drug allergies.   MEDICATIONS:  His MAR is reviewed.   LABORATORY DATA:  B12, ESR, ANA, ammonia, SGOT, SGPT, RPR, folic acid,  TSH:  All unremarkable.  His MRI with and without contrast did show two punctate acute or  subacute infarcts in the brainstem.  Neurology is consulting.  Sodium 146, BUN 18, creatinine 1.17, WBC 8.2, hemoglobin 14, platelet  count 155.  EKG:  QTC 510 milliseconds.   REVIEW OF SYSTEMS:  CONSTITUTIONAL, HEAD, EYES, EARS, NOSE, THROAT,  MOUTH, NEUROLOGIC, PSYCHIATRIC, CARDIOVASCULAR, RESPIRATORY,  GASTROINTESTINAL, GENITOURINARY, SKIN, MUSCULOSKELETAL, HEMATOLOGIC,  LYMPHATIC, ENDOCRINE, METABOLIC:  All unremarkable.   EXAMINATION:  VITAL SIGNS:  Temperature 98.9, pulse 54, respiratory rate  16, blood pressure 138/70, O2 saturation on 2 liters 92%.  GENERAL APPEARANCE:  [name] is a middle-aged male sitting up in his   hospital bed with no abnormal involuntary movements.   MENTAL STATUS EXAM:  [name] is alert.  His eye contact is good.  His attention span is mildly decreased.  His affect is mildly flat at  baseline but with a broad and appropriate range.  His mood is within  normal limits.  His concentration is mildly decreased.  He is oriented  completely to the year, month, day of the month, day of the week, place  and person.  Memory testing:  3/3 words immediate, 1/3 words on recall.  His fund of knowledge and intelligence are within normal limits.  His  speech involves the slight stuttering mentioned above.  Thought process  is logical, coherent, goal-directed.  No looseness of associations.  He  does occasionally have difficulty with word finding.  Thought content:  No thoughts of harming himself or others.  No delusions or  hallucinations.  He is able to write his name fully.  His insight is  intact.  He does realize that he has been having some mental status  abnormalities.  His judgment is intact.   ASSESSMENT:  293.00  Delirium not otherwise specified, resolving  (although by definition delirium can wax and wane).  Rule out 293.83 Mood disorder not otherwise specified.  Axis II:  Deferred.  Axis III:  See past medical history.  Axis IV:  General medical.  Axis V:  55.   [name] is not at risk to harm himself or others.  He agrees to call  emergency services immediately for any thoughts of harming himself,  thoughts of harming others or distress.   The undersigned provided ego supportive psychotherapy and education.   [name] wanted to involve members of his family in the education  process in order to facilitate support, additional history gathering and  facilitate education.  He signed a release of information for Brigit  Cheryll Cockayne and [place].   RECOMMENDATIONS:  1. At this point would not prescribe psychotropic medication other      than Ativan 1-3 mg  p.o. intramuscular or intravenous every 6 hours      p.r.n. agitation with caution regarding sedation or ataxia.  2. If he does develop hallucinations, delusions or thought      disorganization, would recheck an electrocardiogram QTC.  If      between 450 and 500 milliseconds would utilize Zyprexa 5 mg p.o. or      intramuscular daily.  3. Concur with thorough organic workup.  Mr. Memmott history and  recent presentation are not typical for classic bipolar disorder of      either the one or two type.  Also it is noted that this episode      appears to be an organic delirium whereas previous episodes have      not involved this degree of memory dysfunction and disorientation.  4. Recommendation regarding discharge planning:  Would have the social      worker set Mr. Kari up with a psychiatrist during the first week      of discharge.  Would also have him followed closely by Neurology.  He may require a preventive mood stabilizer such as Depakote if his  organic workup is negative, given that he has presented with multiple  episodes of elevated mood in the past.      Antonietta Breach, M.D.  Electronically Signed     JW/MEDQ  D:  06/29/2008  T:  06/29/2008  Job:  [id number]

## 2010-05-24 NOTE — Procedures (Signed)
EEG NUMBER:  [id number]   HISTORY:  This is a 65 year old patient with a history of changes in  behavior and mental status.  The patient is being evaluated for the  altered mental status.  This is a portable EEG study.  No skull defects  are noted.  Medications include Haldol.   EEG CLASSIFICATION:  Normal awake and asleep.   DESCRIPTION OF RECORDING:  The background rhythm of this recording  consists of a fairly well-modulated, medium-amplitude alpha rhythm of 9  Hz that is reactive to eye opening and closure.  As record progresses,  the patient appears to be in the waking state initially, but enters  stage II sleep with rudimentary sleep spindles seen and vertex sharp  wave activity.  The patient spends much of the recording the sleeping  state.  Photic stimulation and hyperventilation were not performed.  At  no time during the recording does there appear to be evidence of spikes  or spike wave discharges or evidence of focal slowing.  EKG monitor  shows no evidence of cardiac rhythm abnormalities with a heart rate of  66.   IMPRESSION:  This is a normal EEG recording in the awake and sleeping  state.  No evidence of ictal or interictal discharges were seen.      Marlan Palau, M.D.  Electronically Signed     OZH:YQMV  D:  06/15/2006 16:53:45  T:  06/16/2006 05:37:44  Job #:  [id number]

## 2010-05-27 NOTE — Op Note (Signed)
NAMEKADARRIUS, YANKE                        ACCOUNT NO.:  [id number]   MEDICAL RECORD NO.:  [id number]                   PATIENT TYPE:  INP   LOCATION:  2899                                 FACILITY:  [facility]   PHYSICIAN:  Ines Bloomer, M.D.              DATE OF BIRTH:  14-Jul-1945   DATE OF PROCEDURE:  02/18/2003  DATE OF DISCHARGE:                                 OPERATIVE REPORT   PREOPERATIVE DIAGNOSIS:  Left upper lobe lesion.   POSTOPERATIVE DIAGNOSIS:  Benign tumor, left upper lobe.   OPERATION PERFORMED:  Left VATS, mini thoracotomy, wedge resection of left  upper lobe lesion with node dissection.   SURGEON:  Ines Bloomer, M.D.   ASSISTANT:  Rowe Clack, P.A.-C.   ANESTHESIA:  General.   INDICATIONS FOR PROCEDURE:  This 65 year old smoker developed a new lesion  of the left upper lobe that was spiculated in nature that was not present a  year ago and was thought to be a possible cancer.  He was brought to the  operating room for resection of this.  He had emphysema, so a needle biopsy  has not been recommended by radiology.   DESCRIPTION OF PROCEDURE:  After general anesthesia, percutaneous insertion  of all monitoring lines, he was turned to the left lateral thoracotomy  position and was prepped and draped in the usual sterile manner.  Two trocar  sites were made in the anterior and posterior axillary line at the 7th  intercostal space.  Two trocars were inserted.  There were multiple  adhesions that were seen of the lower lobe as well as the upper lobe.  Because of this I could not really do a good wedge resection of the lesion  without doing a small thoracotomy.  A posterolateral thoracotomy was made  and carried down with electrocautery through the subcutaneous tissue.  The  latissimus was only partially divided.  The serratus was reflected anterior.  The fifth intercostal space was entered.  Two Tuffiers were placed at right  angles.  The adhesions of  the left lower lobe to the chest wall were taken  down with electrocautery and there were so dense adhesions that a piece of  the left lower lobe lung had to be wedge resected with an EZ-45 stapler.  After this had been removed, there was still bleeding from the chest wall  where the adhesions were and that had to be oversewn with 0 Vicryl in a  figure-of-eight fashion.  Then Tuffier retractors were placed at right  angles and the lesion was stuck to the chest wall and that was taken down  with electrocautery in order to free up the lung.  We also had to take down  multiple adhesions from the apex.  After the lung was freed up, the lesion  was grasped with a __________ lung clamp and resected with three  applications of the EZ-45 stapler and  sent for frozen section.  While they  were doing frozen section, it was decided to do a node dissection.  The  inferior pulmonary ligament was taken down and dissected out and several 9L  nodes were sent for pathologic examination. There were several large nodes  around the bronchus and both 11R, 11L and 10L nodes were dissected free from  the bronchus and up around the pulmonary artery and dissecting superiorly in  the aortopulmonary window, several 5 nodes were dissected free and in the  anterior mediastinal dissection around the superior pulmonary veins and  several more 10L nodes were dissected free.  Frozen section came back that  this was benign lesion, some type of abscess.  It was cultured and the chest  was then closed with two chest tubes placed to the trocar sites and tied in  place with 0 silk.  The chest was  closed with three pericostals.  The On-Q catheters were placed underneath  the pericostals.  The muscle layer was closed with #1 Vicryl.  Subcutaneous  tissue with 2-0 Vicryl and 3-0 Vicryl as a subcuticular stitch.  The patient  was returned to recovery room in stable condition.                                               Ines Bloomer, M.D.    DPB/MEDQ  D:  02/18/2003  T:  02/18/2003  Job:  [id number]   cc:   Joni Fears D. Young, M.D.  [address] [place]  [place] [address]  Fax: [phone]

## 2010-05-27 NOTE — Discharge Summary (Signed)
[name], [name]                        ACCOUNT NO.:  [id number]   MEDICAL RECORD NO.:  [id number]                   PATIENT TYPE:  INP   LOCATION:  3312                                 FACILITY:  [facility]   PHYSICIAN:  Ines Bloomer, M.D.              DATE OF BIRTH:  Apr 29, 1945   DATE OF ADMISSION:  02/18/2003  DATE OF DISCHARGE:  02/22/2003                                 DISCHARGE SUMMARY   ADMISSION DIAGNOSIS:  Left lung mass.   PAST MEDICAL HISTORY:  1. Hyperlipidemia.  2. Emphysema.   PAST SURGICAL HISTORY:  Whole mouth dental extraction in the past.   ALLERGIES:  No known drug allergies.   DISCHARGE DIAGNOSES:  Left upper lobe lesion status post left video-assisted  thoracoscopic surgery, mini thoracotomy, and wedge resection of left upper  lobe lesion with node dissection.   BRIEF HISTORY:  The patient is a 65 year old white male who has been  followed by Dr. Sherrie Mustache for history of emphysema for the past two years.  The  patient recently presented in followup and was complaining of shortness of  breath with exertion.  A chest x-ray showed a left apical nodule.  A CT scan  was currently obtained and confirmed a 14 x 12 mm spiculated lesion in the  left upper lobe without significant adenopathy.  The patient was then  referred to Dr. Edwyna Shell for further evaluation.  He states he has continued  to have some dyspnea on exertion but denies cough, fever, chills, weight  loss, or hemoptysis.  The patient's PFTs showed an FVC of 4.55 and FEV1 of  3.26.  It was Dr. Scheryl Darter opinion that the patient should undergo a left  video-assisted thoracoscopic surgery with resection of left upper lobe  lesion.   HOSPITAL COURSE:  The patient was admitted and taken to the OR on February 18, 2003, for a left video-assisted thoracoscopic surgery, mini thoracotomy,  wedge resection of left upper lobe lesion, and node dissection.  The patient  tolerated the procedure well and was  hemodynamically stable immediately  postoperatively.  The patient was extubated without problem and woke up from  anesthesia neurologically intact.  Frozen sample sent during procedure  revealed no evidence of malignancy.  The patient was transferred to the PACU  in stable condition.   On postoperative day #1, the patient was doing well and was alert.  The  patient was ambulating,and the anterior chest tube was discontinued without  problem.  Pathology has been benign.  The patient's posterior chest tube was  discontinued on postoperative day #2 without complication.   The patient's postoperative course has been as expected.  The patient is  stable condition at this time. As long as the patient remains so, should be  ready for discharge in the morning of February 22, 2003, postoperative day  #4.   LABORATORY DATA:  CBC on February 20, 2003: White count 8.7,  hemoglobin  12.6, hematocrit 36.3, platelets 194.  BMP on February 20, 2003: Sodium 133,  potassium 4.4, BUN 8, creatinine 1.0, glucose 116.   CONDITION ON DISCHARGE:  Improved.   DISCHARGE MEDICATIONS:  1. The patient is to resume his home medication fo Pravachol.  The dosage     was never documented; therefore, he will resume the home dose taken prior     to admission.  2. Wellbutrin 150 mg p.o. daily.  3. Tylox 1 to 2 p.o. q.4-6h. p.r.n. pain.   ACTIVITY:  No driving, no strenuous activity.  The patient is to continue  daily breathing and walking exercises.   DIET:  Low-salt, low-fat, low-cholesterol.   WOUND CARE:  The patient may shower daily and clean incisions with soap and  water.  It the incision becomes red, swollen, drain, or if patient develops  fever greater than 101 degrees F, he is to call CVTS office.   FOLLOW UP:  Appointment with Dr. Edwyna Shell in office; call and schedule that  appointment which should be approximately one week after discharge.  The  patient will need to go to [facility] one  hour before  appointment with Dr. Edwyna Shell to have chest x-ray taken which he will bring  with him to the appointment with Dr. Edwyna Shell.      Pecola Leisure, [place]                      Ines Bloomer, M.D.    AY/MEDQ  D:  02/21/2003  T:  02/21/2003  Job:  [id number]   cc:   Joni Fears D. Young, M.D.  [address] [place]  [place] [address]  Fax: [phone]

## 2010-06-14 ENCOUNTER — Ambulatory Visit: Payer: Self-pay | Admitting: Family Medicine

## 2010-06-14 DIAGNOSIS — J449 Chronic obstructive pulmonary disease, unspecified: Secondary | ICD-10-CM | POA: Insufficient documentation

## 2010-08-09 ENCOUNTER — Ambulatory Visit: Payer: Self-pay | Admitting: Gastroenterology

## 2010-08-09 LAB — HM COLONOSCOPY

## 2010-08-10 LAB — PATHOLOGY REPORT

## 2010-10-27 LAB — RENAL FUNCTION PANEL
Albumin: 3.5
BUN: 9
CO2: 25
Calcium: 8.7
Chloride: 104
Creatinine, Ser: 0.89
GFR calc Af Amer: >60
GFR calc non Af Amer: >60
Glucose, Bld: 93
Phosphorus: 4.2
Potassium: 4
Sodium: 138

## 2010-10-27 LAB — CBC
HCT: 44.9
Hemoglobin: 15.5
MCHC: 34.5
MCV: 91.9
Platelets: 227
RBC: 4.88
RDW: 12.9
WBC: 11.2 — ABNORMAL HIGH

## 2010-10-27 LAB — URINALYSIS, ROUTINE W REFLEX MICROSCOPIC
Bilirubin Urine: NEGATIVE
Glucose, UA: NEGATIVE
Hgb urine dipstick: NEGATIVE
Ketones, ur: NEGATIVE
Nitrite: NEGATIVE
Protein, ur: NEGATIVE
Specific Gravity, Urine: 1.024
Urobilinogen, UA: 0.2
pH: 5.5

## 2010-10-27 LAB — SEDIMENTATION RATE: Sed Rate: 11

## 2010-10-27 LAB — BASIC METABOLIC PANEL
BUN: 17
BUN: 19
CO2: 29
CO2: 29
Calcium: 10.3
Calcium: 8.9
Chloride: 107
Chloride: 112
Creatinine, Ser: 1.06
Creatinine, Ser: 1.39
GFR calc Af Amer: >60
GFR calc Af Amer: >60
GFR calc non Af Amer: 52 — ABNORMAL LOW
GFR calc non Af Amer: >60
Glucose, Bld: 131 — ABNORMAL HIGH
Glucose, Bld: 93
Potassium: 4.3
Potassium: 5
Sodium: 142
Sodium: 143

## 2010-10-27 LAB — RAPID URINE DRUG SCREEN, HOSP PERFORMED
Amphetamines: NOT DETECTED
Barbiturates: NOT DETECTED
Benzodiazepines: NOT DETECTED
Cocaine: NOT DETECTED
Opiates: NOT DETECTED
Tetrahydrocannabinol: NOT DETECTED

## 2010-10-27 LAB — DIFFERENTIAL
Basophils Absolute: 0
Basophils Relative: 0
Eosinophils Absolute: 0.2
Eosinophils Relative: 2
Lymphocytes Relative: 18
Lymphs Abs: 2
Monocytes Absolute: 0.8 — ABNORMAL HIGH
Monocytes Relative: 7
Neutro Abs: 8.2 — ABNORMAL HIGH
Neutrophils Relative %: 74

## 2010-10-27 LAB — HEPATIC FUNCTION PANEL
ALT: 19
AST: 20
Albumin: 4.1
Alkaline Phosphatase: 81
Bilirubin, Direct: 0.1
Indirect Bilirubin: 0.6
Total Bilirubin: 0.7
Total Protein: 7.2

## 2010-10-27 LAB — TSH: TSH: 2.113

## 2010-10-27 LAB — ANA: Anti Nuclear Antibody(ANA): NEGATIVE

## 2010-10-27 LAB — ETHANOL: Alcohol, Ethyl (B): <5

## 2010-10-27 LAB — RPR: RPR Ser Ql: NONREACTIVE

## 2010-10-27 LAB — VITAMIN B12: Vitamin B-12: 433 (ref 211–911)

## 2010-10-27 LAB — FOLATE RBC: RBC Folate: 497

## 2012-03-01 DIAGNOSIS — F0280 Dementia in other diseases classified elsewhere without behavioral disturbance: Secondary | ICD-10-CM | POA: Insufficient documentation

## 2012-03-01 DIAGNOSIS — G309 Alzheimer's disease, unspecified: Secondary | ICD-10-CM | POA: Insufficient documentation

## 2012-03-01 DIAGNOSIS — G2581 Restless legs syndrome: Secondary | ICD-10-CM | POA: Insufficient documentation

## 2012-03-01 DIAGNOSIS — I679 Cerebrovascular disease, unspecified: Secondary | ICD-10-CM | POA: Insufficient documentation

## 2012-03-01 DIAGNOSIS — Z5181 Encounter for therapeutic drug level monitoring: Secondary | ICD-10-CM | POA: Insufficient documentation

## 2012-03-01 DIAGNOSIS — G9341 Metabolic encephalopathy: Secondary | ICD-10-CM | POA: Insufficient documentation

## 2012-03-01 DIAGNOSIS — G609 Hereditary and idiopathic neuropathy, unspecified: Secondary | ICD-10-CM | POA: Insufficient documentation

## 2012-03-01 DIAGNOSIS — F29 Unspecified psychosis not due to a substance or known physiological condition: Secondary | ICD-10-CM

## 2012-03-01 HISTORY — DX: Unspecified psychosis not due to a substance or known physiological condition: F29

## 2012-07-10 ENCOUNTER — Other Ambulatory Visit: Payer: Self-pay

## 2012-07-10 MED ORDER — DIVALPROEX SODIUM ER 500 MG PO TB24: 1000.0000 mg | ORAL_TABLET | Freq: Every evening | ORAL | Status: DC

## 2012-07-20 ENCOUNTER — Emergency Department: Payer: Self-pay | Admitting: Emergency Medicine

## 2012-07-20 LAB — COMPREHENSIVE METABOLIC PANEL
Albumin: 4.5 g/dL (ref 3.4–5.0)
Alkaline Phosphatase: 87 U/L (ref 50–136)
Anion Gap: 10 (ref 7–16)
BUN: 24 mg/dL — ABNORMAL HIGH (ref 7–18)
Bilirubin,Total: 0.4 mg/dL (ref 0.2–1.0)
Calcium, Total: 10.1 mg/dL (ref 8.5–10.1)
Chloride: 109 mmol/L — ABNORMAL HIGH (ref 98–107)
Co2: 23 mmol/L (ref 21–32)
Creatinine: 1.45 mg/dL — ABNORMAL HIGH (ref 0.60–1.30)
EGFR (African American): 58 — ABNORMAL LOW
EGFR (Non-African Amer.): 50 — ABNORMAL LOW
Glucose: 118 mg/dL — ABNORMAL HIGH (ref 65–99)
Osmolality: 288 (ref 275–301)
Potassium: 3.8 mmol/L (ref 3.5–5.1)
SGOT(AST): 29 U/L (ref 15–37)
SGPT (ALT): 25 U/L (ref 12–78)
Sodium: 142 mmol/L (ref 136–145)
Total Protein: 8.4 g/dL — ABNORMAL HIGH (ref 6.4–8.2)

## 2012-07-20 LAB — URINALYSIS, COMPLETE
Bilirubin,UR: NEGATIVE
Glucose,UR: NEGATIVE mg/dL (ref 0–75)
Hyaline Cast: 14
Ketone: NEGATIVE
Nitrite: NEGATIVE
Ph: 5 (ref 4.5–8.0)
Protein: 30
RBC,UR: 4 /HPF (ref 0–5)
Specific Gravity: 1.024 (ref 1.003–1.030)
Squamous Epithelial: <1
WBC UR: 11 /HPF (ref 0–5)

## 2012-07-20 LAB — CBC
HCT: 40.8 % (ref 40.0–52.0)
HGB: 14.2 g/dL (ref 13.0–18.0)
MCH: 33.5 pg (ref 26.0–34.0)
MCHC: 34.7 g/dL (ref 32.0–36.0)
MCV: 97 fL (ref 80–100)
Platelet: 209 10*3/uL (ref 150–440)
RBC: 4.23 10*6/uL — ABNORMAL LOW (ref 4.40–5.90)
RDW: 13.8 % (ref 11.5–14.5)
WBC: 9.3 10*3/uL (ref 3.8–10.6)

## 2012-07-20 LAB — DRUG SCREEN, URINE

## 2012-07-20 LAB — ETHANOL
Ethanol %: <0.003 % (ref 0.000–0.080)
Ethanol: <3 mg/dL

## 2012-07-20 LAB — TSH: Thyroid Stimulating Horm: 5.07 u[IU]/mL — ABNORMAL HIGH

## 2012-07-31 ENCOUNTER — Encounter: Payer: Self-pay | Admitting: Neurology

## 2012-07-31 ENCOUNTER — Ambulatory Visit (INDEPENDENT_AMBULATORY_CARE_PROVIDER_SITE_OTHER): Payer: Medicare Other | Admitting: Neurology

## 2012-07-31 ENCOUNTER — Other Ambulatory Visit: Payer: Self-pay | Admitting: Neurology

## 2012-07-31 VITALS — BP 107/65 | HR 65 | Ht 70.0 in | Wt 170.0 lb

## 2012-07-31 DIAGNOSIS — F29 Unspecified psychosis not due to a substance or known physiological condition: Secondary | ICD-10-CM

## 2012-07-31 DIAGNOSIS — F319 Bipolar disorder, unspecified: Secondary | ICD-10-CM

## 2012-07-31 DIAGNOSIS — Z5181 Encounter for therapeutic drug level monitoring: Secondary | ICD-10-CM

## 2012-07-31 DIAGNOSIS — E785 Hyperlipidemia, unspecified: Secondary | ICD-10-CM

## 2012-07-31 DIAGNOSIS — G9341 Metabolic encephalopathy: Secondary | ICD-10-CM

## 2012-07-31 DIAGNOSIS — I679 Cerebrovascular disease, unspecified: Secondary | ICD-10-CM

## 2012-07-31 DIAGNOSIS — G609 Hereditary and idiopathic neuropathy, unspecified: Secondary | ICD-10-CM

## 2012-07-31 DIAGNOSIS — R413 Other amnesia: Secondary | ICD-10-CM

## 2012-07-31 DIAGNOSIS — J438 Other emphysema: Secondary | ICD-10-CM

## 2012-07-31 DIAGNOSIS — F0280 Dementia in other diseases classified elsewhere without behavioral disturbance: Secondary | ICD-10-CM

## 2012-07-31 DIAGNOSIS — G2581 Restless legs syndrome: Secondary | ICD-10-CM

## 2012-07-31 HISTORY — DX: Other amnesia: R41.3

## 2012-07-31 NOTE — Progress Notes (Signed)
Reason for visit: Memory disturbance  [name] is an 67 y.o. male  History of present illness:  [name] is a 67 year old left-handed white male with a history of a bipolar disorder and mild memory issues. The patient recently was in the hospital at [facility] with an episode of mania. The patient began spending money unusually, and he was not sleeping well. The patient indicates that he felt the problem coming on over one to 2 weeks prior to the hospitalization. During the hospitalization, his medications were not altered. The patient is still on Depakote taking 1000 mg daily. The patient takes Zoloft for features of OCD. The patient has lorazepam at nighttime and he will take alprazolam if needed. The patient otherwise has not had any other new medical problems since last seen. The patient is back to or near his baseline.  Past Medical History  Diagnosis Date  . Episodic confusion     Possible bipolar disorder  . GERD (gastroesophageal reflux disease)   . Peripheral neuropathy   . RLS (restless legs syndrome)   . Dyslipidemia   . Cerebrovascular disease   . Emphysema   . Patent foramen ovale   . History of anxiety   . OCD (obsessive compulsive disorder)   . Memory deficit 07/31/2012  . Bipolar disorder with severe mania     Past Surgical History  Procedure Laterality Date  . None      Family History  Problem Relation Age of Onset  . Heart disease Mother   . Cancer Father   . Heart disease Father   . Diabetes Sister   . Seizures Paternal Grandfather   . Heart disease Sister     Social history:  reports that he has been smoking.  He does not have any smokeless tobacco history on file. He reports that he does not drink alcohol or use illicit drugs.  Allergies: No Known Allergies  Medications:  Current Outpatient Prescriptions on File Prior to Visit  Medication Sig Dispense Refill  . divalproex (DEPAKOTE ER) 500 MG 24 hr tablet Take 2 tablets (1,000 mg total) by  mouth every evening.  180 tablet  1   No current facility-administered medications on file prior to visit.    ROS:  Out of a complete 14 system review of symptoms, the patient complains only of the following symptoms, and all other reviewed systems are negative.  Hearing loss Shortness of breath, snoring Achy muscles Confusion, headache Anxiety, insomnia, change in appetite Restless legs  Blood pressure 107/65, pulse 65, height 5\' 10"  (1.778 m), weight 170 lb (77.111 kg).  Physical Exam  General: The patient is alert and cooperative at the time of the examination.  Skin: No significant peripheral edema is noted.   Neurologic Exam  Cranial nerves: Facial symmetry is present. Speech is normal, no aphasia or dysarthria is noted. Extraocular movements are full. Visual fields are full.  Motor: The patient has good strength in all 4 extremities.  Coordination: The patient has good finger-nose-finger and heel-to-shin bilaterally.  Gait and station: The patient has a normal gait. Tandem gait is slightly unsteady. Romberg is negative. No drift is seen.  Reflexes: Deep tendon reflexes are symmetric.   Assessment/Plan:  1. Mild memory disturbance  2. Bipolar disorder  3. Obsessive-compulsive disorder  The patient is not currently being followed through psychiatry. Given the recent episode, I will make a referral at this time to ensure that his treatment regimen is adequate for him. The patient has had  several episodes of mania in the past, this most recent event was most severe. The patient never had severe depressive episodes. The memory disturbance will be followed over time, the patient will be seen again in 6 months. Blood work will be done today.  Kevin Palau MD 07/31/2012 7:45 PM  [facility] [address] [place], [place] [address]  Phone [phone] Fax [phone]

## 2012-08-01 ENCOUNTER — Telehealth: Payer: Self-pay | Admitting: Neurology

## 2012-08-01 LAB — TSH: TSH: 3.25 u[IU]/mL (ref 0.450–4.500)

## 2012-08-01 LAB — COMPREHENSIVE METABOLIC PANEL
ALT: 10 IU/L (ref 0–44)
AST: 14 IU/L (ref 0–40)
Albumin/Globulin Ratio: 2 (ref 1.1–2.5)
Albumin: 4.3 g/dL (ref 3.6–4.8)
Alkaline Phosphatase: 55 IU/L (ref 39–117)
BUN/Creatinine Ratio: 16 (ref 10–22)
BUN: 15 mg/dL (ref 8–27)
CO2: 23 mmol/L (ref 18–29)
Calcium: 9 mg/dL (ref 8.6–10.2)
Chloride: 102 mmol/L (ref 97–108)
Creatinine, Ser: 0.96 mg/dL (ref 0.76–1.27)
GFR calc Af Amer: 94 mL/min/{1.73_m2} (ref >59)
GFR calc non Af Amer: 81 mL/min/{1.73_m2} (ref >59)
Globulin, Total: 2.1 g/dL (ref 1.5–4.5)
Glucose: 79 mg/dL (ref 65–99)
Potassium: 4.4 mmol/L (ref 3.5–5.2)
Sodium: 142 mmol/L (ref 134–144)
Total Bilirubin: 0.3 mg/dL (ref 0.0–1.2)
Total Protein: 6.4 g/dL (ref 6.0–8.5)

## 2012-08-01 LAB — CBC WITH DIFFERENTIAL
Basophils Absolute: 0 10*3/uL (ref 0.0–0.2)
Basos: 1 % (ref 0–3)
Eos: 4 % (ref 0–5)
Eosinophils Absolute: 0.2 10*3/uL (ref 0.0–0.4)
HCT: 36.4 % — ABNORMAL LOW (ref 37.5–51.0)
Hemoglobin: 12.5 g/dL — ABNORMAL LOW (ref 12.6–17.7)
Immature Grans (Abs): 0 10*3/uL (ref 0.0–0.1)
Immature Granulocytes: 0 % (ref 0–2)
Lymphocytes Absolute: 1.6 10*3/uL (ref 0.7–3.1)
Lymphs: 29 % (ref 14–46)
MCH: 32.7 pg (ref 26.6–33.0)
MCHC: 34.3 g/dL (ref 31.5–35.7)
MCV: 95 fL (ref 79–97)
Monocytes Absolute: 0.4 10*3/uL (ref 0.1–0.9)
Monocytes: 8 % (ref 4–12)
Neutrophils Absolute: 3.1 10*3/uL (ref 1.4–7.0)
Neutrophils Relative %: 58 % (ref 40–74)
Platelets: 183 10*3/uL (ref 150–379)
RBC: 3.82 x10E6/uL — ABNORMAL LOW (ref 4.14–5.80)
RDW: 14.1 % (ref 12.3–15.4)
WBC: 5.3 10*3/uL (ref 3.4–10.8)

## 2012-08-01 LAB — VALPROIC ACID LEVEL: Valproic Acid Lvl: 65 ug/mL (ref 50–100)

## 2012-08-01 NOTE — Telephone Encounter (Signed)
I called patient. The blood work was unremarkable, Depakote level was 65. We have room to increase the medication, but the patient indicates that he likely stopped the medication before the mania episode developed.

## 2012-08-05 ENCOUNTER — Other Ambulatory Visit: Payer: Self-pay

## 2012-08-05 MED ORDER — SERTRALINE HCL 100 MG PO TABS: 100.0000 mg | ORAL_TABLET | Freq: Every day | ORAL | Status: DC

## 2012-08-05 NOTE — Telephone Encounter (Signed)
We have been prescribing this med per records in [facility].

## 2012-08-06 ENCOUNTER — Telehealth: Payer: Self-pay

## 2012-08-06 NOTE — Telephone Encounter (Signed)
I called patient and I left a message. We have not given a prescription for lorazepam before. They are to contact the doctor on the bottle of the prescription for a refill.

## 2012-08-06 NOTE — Telephone Encounter (Signed)
[facility] sent a fax requesting a refill on Ativan.  By viewing chart in [facility] and [facility], it does not appear we have prescribed this medication since 2012.  Would you like to refill?  Please advise.  Thank you.

## 2012-12-09 ENCOUNTER — Encounter (INDEPENDENT_AMBULATORY_CARE_PROVIDER_SITE_OTHER): Payer: Self-pay

## 2012-12-09 ENCOUNTER — Encounter: Payer: Self-pay | Admitting: Nurse Practitioner

## 2012-12-09 ENCOUNTER — Ambulatory Visit (INDEPENDENT_AMBULATORY_CARE_PROVIDER_SITE_OTHER): Payer: Medicare Other | Admitting: Nurse Practitioner

## 2012-12-09 VITALS — BP 124/72 | HR 64 | Ht 70.0 in | Wt 181.0 lb

## 2012-12-09 DIAGNOSIS — F319 Bipolar disorder, unspecified: Secondary | ICD-10-CM

## 2012-12-09 DIAGNOSIS — R413 Other amnesia: Secondary | ICD-10-CM

## 2012-12-09 MED ORDER — DIVALPROEX SODIUM ER 500 MG PO TB24: 1000.0000 mg | ORAL_TABLET | Freq: Every evening | ORAL | Status: DC

## 2012-12-09 MED ORDER — SERTRALINE HCL 100 MG PO TABS: 100.0000 mg | ORAL_TABLET | Freq: Every day | ORAL | Status: DC

## 2012-12-09 NOTE — Patient Instructions (Signed)
Will continue Depakote, refills for 3 months with a refill F/U in 6 months

## 2012-12-09 NOTE — Progress Notes (Signed)
[facility]  PATIENT: [name] DOB: 02-Nov-1945   REASON FOR VISIT: mild memory issues and bipolar disorder    HISTORY OF PRESENT ILLNESS: Mr. Devera, 67 year old white male returns for followup with his 2 sisters. He was last seen by Dr. Anne Hahn 07/31/2012 after a recent hospital admissions to [facility]  for an episode of mania. He also has a history of bipolar disorder and obsessive-compulsive disorder. He is currently on Depakote and Zoloft tolerating the medications without side effects. Depakote level in July was 67. He gets his routine labs done with Dr. Jamse Belfast. He has not had further episodes of mania he feels like his condition is stable, the sisters agree. Dr. Anne Hahn progress note from last visit that he will be set up with psychiatry but the sisters are adamant that he does not need psychiatry.MRI of the brain in the past shows small vessel white matter ischemic disease.  HISTORY: white male with a history of a bipolar disorder and mild memory issues. The patient recently was in the hospital at [facility] with an episode of mania. The patient began spending money unusually, and he was not sleeping well. The patient indicates that he felt the problem coming on over one to 2 weeks prior to the hospitalization. During the hospitalization, his medications were not altered. The patient is still on Depakote taking 1000 mg daily. The patient takes Zoloft for features of OCD. The patient has lorazepam at nighttime and he will take alprazolam if needed. The patient otherwise has not had any other new medical problems since last seen.   REVIEW OF SYSTEMS: Full 14 system review of systems performed and notable only for: all negative Constitutional: [name]  Cardiovascular: [name]  Ear/Nose/Throat: [name]  Skin: [name]  Eyes: [name]  Respiratory: [name]  Gastroitestinal: [name]  Hematology/Lymphatic: [name]  Endocrine: [name] Musculoskeletal:[name]  Allergy/Immunology: [name]  Neurological:  [name] Psychiatric: [name]   ALLERGIES: No Known Allergies  HOME MEDICATIONS: Outpatient Prescriptions Prior to Visit  Medication Sig Dispense Refill  . ALPRAZolam (XANAX) 0.25 MG tablet Take 0.25 mg by mouth 3 (three) times daily as needed.       [name] aspirin 325 MG tablet Take 325 mg by mouth daily.      . Coenzyme Q10 (CO Q-10) 300 MG CAPS Take 300 mg by mouth daily.      . divalproex (DEPAKOTE ER) 500 MG 24 hr tablet Take 2 tablets (1,000 mg total) by mouth every evening.  180 tablet  1  . LORazepam (ATIVAN) 0.5 MG tablet Take 0.5 mg by mouth daily.      . Omega-3 Fatty Acids (OMEGA-3 FISH OIL) 1200 MG CAPS Take 1,200 mg by mouth daily.      . sertraline (ZOLOFT) 100 MG tablet Take 1 tablet (100 mg total) by mouth daily.  90 tablet  1  . simvastatin (ZOCOR) 40 MG tablet Take 40 mg by mouth daily.       No facility-administered medications prior to visit.    PAST MEDICAL HISTORY: Past Medical History  Diagnosis Date  . Episodic confusion     Possible bipolar disorder  . GERD (gastroesophageal reflux disease)   . Peripheral neuropathy   . RLS (restless legs syndrome)   . Dyslipidemia   . Cerebrovascular disease   . Emphysema   . Patent foramen ovale   . History of anxiety   . OCD (obsessive compulsive disorder)   . Memory deficit 07/31/2012  . Bipolar disorder with severe mania  PAST SURGICAL HISTORY: Past Surgical History  Procedure Laterality Date  . None      FAMILY HISTORY: Family History  Problem Relation Age of Onset  . Heart disease Mother   . Cancer Father   . Heart disease Father   . Diabetes Sister   . Seizures Paternal Grandfather   . Heart disease Sister     SOCIAL HISTORY: History   Social History  . Marital Status: Single    Spouse Name: [name]    Number of Children: 0  . Years of Education: 11   Occupational History  . Retired    Social History Main Topics  . Smoking status: Current Every Day Smoker -- 1.00 packs/day  . Smokeless tobacco:  Never Used  . Alcohol Use: No  . Drug Use: No  . Sexual Activity: Not on file   Other Topics Concern  . Not on file   Social History Narrative   Patient lives at home with wife.    Patient is retired.    Patient has no children.    Patient has 11th grade education.      PHYSICAL EXAM  Filed Vitals:   12/09/12 1049  BP: 124/72  Pulse: 64  Height: 5\' 10"  (1.778 m)  Weight: 181 lb (82.101 kg)   Body mass index is 25.97 kg/(m^2).  Generalized: Well developed, in no acute distress  Head: normocephalic and atraumatic,. Oropharynx benign  Neck: Supple, no carotid bruits  Cardiac: Regular rate rhythm, no murmur  Musculoskeletal: No deformity   Neurological examination   Mentation: Alert oriented to time, place, history taking. Follows all commands speech and language fluent  Cranial nerve II-XII: Pupils were equal round reactive to light extraocular movements were full, visual field were full on confrontational test. Facial sensation and strength were normal. hearing was intact to finger rubbing bilaterally. Uvula tongue midline. head turning and shoulder shrug and were normal and symmetric.Tongue protrusion into cheek strength was normal. Motor: normal bulk and tone, full strength in the BUE, BLE, fine finger movements normal, no pronator drift. No focal weakness Coordination: finger-nose-finger, heel-to-shin bilaterally, no dysmetria Reflexes: Brachioradialis 2/2, biceps 2/2, triceps 2/2, patellar 2/2, Achilles 2/2, plantar responses were flexor bilaterally. Gait and Station: Rising up from seated position without assistance, normal stance, moderate stride, good arm swing, smooth turning, able to perform tiptoe, and heel walking without difficulty. Tandem gait is steady  DIAGNOSTIC DATA (LABS, IMAGING, TESTING) - I reviewed patient records, labs, notes, testing and imaging myself where available.  Lab Results  Component Value Date   WBC 5.3 07/31/2012   HGB 12.5* 07/31/2012     HCT 36.4* 07/31/2012   MCV 95 07/31/2012   PLT 183 07/31/2012      Component Value Date/Time   NA 142 07/31/2012 1047   NA 144 06/29/2008 0503   K 4.4 07/31/2012 1047   CL 102 07/31/2012 1047   CO2 23 07/31/2012 1047   GLUCOSE 79 07/31/2012 1047   GLUCOSE 87 06/29/2008 0503   BUN 15 07/31/2012 1047   BUN 12 06/29/2008 0503   CREATININE 0.96 07/31/2012 1047   CALCIUM 9.0 07/31/2012 1047   PROT 6.4 07/31/2012 1047   PROT 6.3 06/26/2008 0605   ALBUMIN 3.5 06/26/2008 0605   AST 14 07/31/2012 1047   ALT 10 07/31/2012 1047   ALKPHOS 55 07/31/2012 1047   BILITOT 0.3 07/31/2012 1047   GFRNONAA 81 07/31/2012 1047   GFRAA 94 07/31/2012 1047    Lab Results  Component Value  Date   TSH 3.250 07/31/2012    ASSESSMENT AND PLAN  67 y.o. year old male  has a past medical history of Episodic confusion;  Dyslipidemia; Cerebrovascular disease; Emphysema; History of anxiety; OCD (obsessive compulsive disorder); Memory deficit (07/31/2012); and Bipolar disorder with severe mania. here in followup. He is currently on Zoloft and Depakote without further episodes of mania, memory is stable.  Will continue Depakote, and Zoloft refills for 3 months with a refill F/U in 6 months Nilda Riggs, [facility], [facility], APRN  [facility] [address] [place], [place] [address] [phone]

## 2012-12-09 NOTE — Progress Notes (Signed)
I have read the note, and I agree with the clinical assessment and plan.  Uri Turnbough KEITH   

## 2013-03-11 ENCOUNTER — Ambulatory Visit: Payer: Self-pay | Admitting: Family Medicine

## 2013-06-09 ENCOUNTER — Ambulatory Visit: Payer: Medicare Other | Admitting: Nurse Practitioner

## 2013-06-09 ENCOUNTER — Telehealth: Payer: Self-pay | Admitting: Nurse Practitioner

## 2013-06-09 NOTE — Telephone Encounter (Signed)
No show for scheduled appointment

## 2013-07-03 ENCOUNTER — Encounter (INDEPENDENT_AMBULATORY_CARE_PROVIDER_SITE_OTHER): Payer: Self-pay

## 2013-07-03 ENCOUNTER — Encounter: Payer: Self-pay | Admitting: Nurse Practitioner

## 2013-07-03 ENCOUNTER — Ambulatory Visit (INDEPENDENT_AMBULATORY_CARE_PROVIDER_SITE_OTHER): Payer: Medicare Other | Admitting: Nurse Practitioner

## 2013-07-03 VITALS — BP 107/76 | HR 75 | Ht 70.0 in | Wt 171.0 lb

## 2013-07-03 DIAGNOSIS — R413 Other amnesia: Secondary | ICD-10-CM

## 2013-07-03 DIAGNOSIS — F319 Bipolar disorder, unspecified: Secondary | ICD-10-CM

## 2013-07-03 MED ORDER — SERTRALINE HCL 100 MG PO TABS: 150.0000 mg | ORAL_TABLET | Freq: Every day | ORAL | Status: DC

## 2013-07-03 MED ORDER — DIVALPROEX SODIUM ER 500 MG PO TB24: 1000.0000 mg | ORAL_TABLET | Freq: Every evening | ORAL | Status: DC

## 2013-07-03 NOTE — Progress Notes (Signed)
[facility]  PATIENT: [name] DOB: 05/13/1945   REASON FOR VISIT: Followup for memory issues and bipolar disorder   HISTORY OF PRESENT ILLNESS:Mr. Waas, 68 year old white male returns for followup with his 2 sisters. He was last seen 12/09/12.  He was evaluated by Dr. Willis07/23/2014 after a hospital admissions to [facility] for an episode of mania. He also has a history of bipolar disorder and obsessive-compulsive disorder. He is currently on Depakote and Zoloft tolerating the medications without side effects. He gets his routine labs done with Dr. Juanetta Beets. He has not had further episodes of mania he feels like his condition is stable, his sisters think there have been two episodes since last seen that lasted a few days.  Dr. Jannifer Franklin wanted him  set up with psychiatry but the sisters are adamant that he does not need psychiatry.MRI of the brain in the past shows small vessel white matter ischemic disease.  HISTORY: white male with a history of a bipolar disorder and mild memory issues. The patient recently was in the hospital at [facility] with an episode of mania. The patient began spending money unusually, and he was not sleeping well. The patient indicates that he felt the problem coming on over one to 2 weeks prior to the hospitalization. During the hospitalization, his medications were not altered. The patient is still on Depakote taking 1000 mg daily. The patient takes Zoloft for features of OCD. The patient has lorazepam at nighttime and he will take alprazolam if needed. The patient otherwise has not had any other new medical problems since last seen.    REVIEW OF SYSTEMS: Full 14 system review of systems performed and notable only for those listed, all others are neg:  Constitutional: [name]  Cardiovascular: [name]  Ear/Nose/Throat: [name]  Skin: [name]  Eyes: [name]  Respiratory: [name]  Gastroitestinal: [name]  Hematology/Lymphatic: [name]  Endocrine:  [name] Musculoskeletal:[name]  Allergy/Immunology: [name]  Neurological: [name] Psychiatric: [name] Sleep : Restless leg  ALLERGIES: No Known Allergies  HOME MEDICATIONS: Outpatient Prescriptions Prior to Visit  Medication Sig Dispense Refill  . ALPRAZolam (XANAX) 0.25 MG tablet Take 0.25 mg by mouth 2 (two) times daily. Dr. Caryn Section      . aspirin 325 MG tablet Take 325 mg by mouth daily.      . Coenzyme Q10 (CO Q-10) 300 MG CAPS Take 300 mg by mouth daily.      . divalproex (DEPAKOTE ER) 500 MG 24 hr tablet Take 2 tablets (1,000 mg total) by mouth every evening.  180 tablet  1  . LORazepam (ATIVAN) 0.5 MG tablet Take 0.5 mg by mouth daily. Dr. Caryn Section      . Omega-3 Fatty Acids (OMEGA-3 FISH OIL) 1200 MG CAPS Take 1,200 mg by mouth daily.      . sertraline (ZOLOFT) 100 MG tablet Take 1 tablet (100 mg total) by mouth daily.  90 tablet  1  . simvastatin (ZOCOR) 40 MG tablet Take 40 mg by mouth daily.       No facility-administered medications prior to visit.    PAST MEDICAL HISTORY: Past Medical History  Diagnosis Date  . Episodic confusion     Possible bipolar disorder  . GERD (gastroesophageal reflux disease)   . Peripheral neuropathy   . RLS (restless legs syndrome)   . Dyslipidemia   . Cerebrovascular disease   . Emphysema   . Patent foramen ovale   . History of anxiety   . OCD (obsessive compulsive disorder)   .  Memory deficit 07/31/2012  . Bipolar disorder with severe mania     PAST SURGICAL HISTORY: Past Surgical History  Procedure Laterality Date  . None      FAMILY HISTORY: Family History  Problem Relation Age of Onset  . Heart disease Mother   . Cancer Father   . Heart disease Father   . Diabetes Sister   . Seizures Paternal Grandfather   . Heart disease Sister     SOCIAL HISTORY: History   Social History  . Marital Status: Single    Spouse Name: [name]    Number of Children: 0  . Years of Education: 11   Occupational History  . Retired    Social History  Main Topics  . Smoking status: Current Every Day Smoker -- 1.00 packs/day  . Smokeless tobacco: Never Used  . Alcohol Use: No  . Drug Use: No  . Sexual Activity: Not on file   Other Topics Concern  . Not on file   Social History Narrative   Patient lives at home with wife.    Patient is retired.    Patient has no children.    Patient has 11th grade education.      PHYSICAL EXAM  Filed Vitals:   07/03/13 1522  BP: 107/76  Pulse: 75  Height: 5\' 10"  (1.778 m)  Weight: 171 lb (77.565 kg)   Body mass index is 24.54 kg/(m^2). Generalized: Well developed, in no acute distress  Head: normocephalic and atraumatic,. Oropharynx benign  Neck: Supple, no carotid bruits  Cardiac: Regular rate rhythm, no murmur  Musculoskeletal: No deformity  Neurological examination  Mentation: Alert oriented to time, place, history taking. MMSE 29/30. AFT 16. Follows all commands speech and language fluent  Cranial nerve II-XII: Pupils were equal round reactive to light extraocular movements were full, visual field were full on confrontational test. Facial sensation and strength were normal. hearing was intact to finger rubbing bilaterally. Uvula tongue midline. head turning and shoulder shrug and were normal and symmetric.Tongue protrusion into cheek strength was normal.  Motor: normal bulk and tone, full strength in the BUE, BLE, fine finger movements normal, no pronator drift. No focal weakness  Coordination: finger-nose-finger, heel-to-shin bilaterally, no dysmetria  Reflexes: Brachioradialis 2/2, biceps 2/2, triceps 2/2, patellar 2/2, Achilles 2/2, plantar responses were flexor bilaterally.  Gait and Station: Rising up from seated position without assistance, normal stance, moderate stride, good arm swing, smooth turning, able to perform tiptoe, and heel walking without difficulty. Tandem gait is steady  DIAGNOSTIC DATA (LABS, IMAGING, TESTING) - I reviewed patient records, labs, notes, testing and  imaging myself where available.  Lab Results  Component Value Date   WBC 5.3 07/31/2012   HGB 12.5* 07/31/2012   HCT 36.4* 07/31/2012   MCV 95 07/31/2012   PLT 183 07/31/2012      Component Value Date/Time   NA 142 07/31/2012 1047   NA 144 06/29/2008 0503   K 4.4 07/31/2012 1047   CL 102 07/31/2012 1047   CO2 23 07/31/2012 1047   GLUCOSE 79 07/31/2012 1047   GLUCOSE 87 06/29/2008 0503   BUN 15 07/31/2012 1047   BUN 12 06/29/2008 0503   CREATININE 0.96 07/31/2012 1047   CALCIUM 9.0 07/31/2012 1047   PROT 6.4 07/31/2012 1047   PROT 6.3 06/26/2008 0605   ALBUMIN 3.5 06/26/2008 0605   AST 14 07/31/2012 1047   ALT 10 07/31/2012 1047   ALKPHOS 55 07/31/2012 1047   BILITOT 0.3 07/31/2012 1047  GFRNONAA 81 07/31/2012 1047   GFRAA 94 07/31/2012 1047     Lab Results  Component Value Date   TSH 3.250 07/31/2012      ASSESSMENT AND PLAN  68 y.o. year old male  has a past medical history of Episodic confusion;  RLS (restless legs syndrome); Dyslipidemia; Cerebrovascular disease; Emphysema; Patent foramen ovale; History of anxiety; OCD (obsessive compulsive disorder); Memory deficit (07/31/2012); and Bipolar disorder with severe mania. here to followup.  Increase Zoloft 150 mg daily, 1 and 1/2 tabs, will refill Memory score is stable Continue Depakote at current dose, will refill Followup in 6 months, next visit  with Dr. Jerline Pain, [facility], [facility], [facility] [address] [place], [place] [address] [phone]

## 2013-07-03 NOTE — Progress Notes (Signed)
I have read the note, and I agree with the clinical assessment and plan.  WILLIS,CHARLES KEITH   

## 2013-07-03 NOTE — Patient Instructions (Signed)
Increase Zoloft 150 mg daily, 1 and 1/2 tabs, will refill Memory score is stable Continue Depakote at current dose, will refill Followup in 6 months, next visit  with Dr. Jannifer Franklin

## 2013-07-14 LAB — CBC AND DIFFERENTIAL
HCT: 40 % — AB (ref 41–53)
Hemoglobin: 14.2 g/dL (ref 13.5–17.5)
Platelets: 218 10*3/uL (ref 150–399)
WBC: 9.5 10^3/mL

## 2013-07-14 LAB — BASIC METABOLIC PANEL
BUN: 18 mg/dL (ref 4–21)
Creatinine: 1.2 mg/dL (ref <=1.3)
Glucose: 102 mg/dL
Potassium: 4.7 mmol/L (ref 3.4–5.3)
Sodium: 143 mmol/L (ref 137–147)

## 2013-07-14 LAB — HEPATIC FUNCTION PANEL
ALT: 22 U/L (ref 10–40)
AST: 30 U/L (ref 14–40)

## 2013-07-14 LAB — PSA: PSA: 2.4

## 2013-07-14 LAB — LIPID PANEL
Cholesterol: 154 mg/dL (ref 0–200)
HDL: 78 mg/dL — AB (ref 35–70)
LDL Cholesterol: 60 mg/dL
Triglycerides: 78 mg/dL (ref 40–160)

## 2013-07-14 LAB — TSH: TSH: 3.5 u[IU]/mL (ref <=5.90)

## 2013-08-25 ENCOUNTER — Ambulatory Visit: Payer: Self-pay | Admitting: Neurology

## 2013-08-28 ENCOUNTER — Telehealth: Payer: Self-pay | Admitting: Nurse Practitioner

## 2013-08-28 MED ORDER — SERTRALINE HCL 100 MG PO TABS: 150.0000 mg | ORAL_TABLET | Freq: Every day | ORAL | Status: DC

## 2013-08-28 NOTE — Telephone Encounter (Signed)
Rx has been sent.  I called the patient back.  He is aware.

## 2013-08-28 NOTE — Telephone Encounter (Signed)
Patient requesting at least 10 to 12 pill of sertraline (ZOLOFT) 100 MG tablet, until his come through mail order.  Please call Rx in to [place] [facility], [place], [place].  Please call and advise.

## 2013-11-26 ENCOUNTER — Encounter: Payer: Self-pay | Admitting: Neurology

## 2013-12-02 ENCOUNTER — Encounter: Payer: Self-pay | Admitting: Neurology

## 2014-01-06 ENCOUNTER — Ambulatory Visit: Payer: Medicare Other | Admitting: Neurology

## 2014-01-13 ENCOUNTER — Other Ambulatory Visit: Payer: Self-pay

## 2014-01-13 MED ORDER — DIVALPROEX SODIUM ER 500 MG PO TB24: 1000.0000 mg | ORAL_TABLET | Freq: Every evening | ORAL | Status: DC

## 2014-01-13 MED ORDER — SERTRALINE HCL 100 MG PO TABS: 150.0000 mg | ORAL_TABLET | Freq: Every day | ORAL | Status: DC

## 2014-01-26 ENCOUNTER — Other Ambulatory Visit: Payer: Self-pay | Admitting: Neurology

## 2014-05-01 NOTE — Consult Note (Signed)
PATIENT NAME:  [name], [name] MR#:  [id number] DATE OF BIRTH:  [birth year]  DATE OF CONSULTATION:  07/22/2012  REFERRING PHYSICIAN:   CONSULTING PHYSICIAN:  Idalia Needle. Chauncy Passy, MD  HISTORY OF PRESENT ILLNESS:  [name] was seen in the ED on 07/21/2012 by Dr. Franchot Mimes. She recommended that he stay in the ED and be evaluated on 07/23/2002 for a discharge back home.  I have seen [name] and he reports that is the reason he is here "too many people trying to get in my business." He says that he has been here for 3 days and the people here have "been great", but that he is now ready to go back home.   His family is also ready for him to go back home. His sister reports that she lives right across the street from him and helps him with his medicines and other things and her husband has had surgery was in the hospital for several days, so she did not have as much interaction with [name] and that he likely "miss some meds."  She reports that he depends on her and that it "sort of spooked him when I was not there." She goes on to report that Dr. Jannifer Franklin is his neurologist in [place] and sees them regularly and prescribes his medications, which he usually takes religiously with her help.   On interview, he is, pleasant and cooperative and appropriate. There is no agitation or irritability. He is a thoughtful and easy to sit with and talk with. He does not endorse any depression, psychosis, AH or VH or delusions or psychosis. He says that he is ready to go home and his sister is ready to come pick him up.   PLAN:   1.  [name] has been taking his medicines while here and he will continue the same at home  2.   [name] will be discharged on to his sister.  ____________________________ Idalia Needle. Chauncy Passy, MD fcg:cc D: 07/22/2012 16:09:00 ET T: 07/22/2012 16:48:39 ET JOB#: [id number]  cc: Idalia Needle. Chauncy Passy, MD, <Dictator> Ladoris Gene MD ELECTRONICALLY SIGNED 07/24/2012 14:35

## 2014-07-08 DIAGNOSIS — F419 Anxiety disorder, unspecified: Secondary | ICD-10-CM | POA: Insufficient documentation

## 2014-07-08 DIAGNOSIS — R0609 Other forms of dyspnea: Secondary | ICD-10-CM | POA: Insufficient documentation

## 2014-07-08 DIAGNOSIS — R5383 Other fatigue: Secondary | ICD-10-CM | POA: Insufficient documentation

## 2014-07-08 DIAGNOSIS — R0600 Dyspnea, unspecified: Secondary | ICD-10-CM | POA: Insufficient documentation

## 2014-07-16 ENCOUNTER — Encounter: Payer: Self-pay | Admitting: Family Medicine

## 2014-07-16 ENCOUNTER — Ambulatory Visit (INDEPENDENT_AMBULATORY_CARE_PROVIDER_SITE_OTHER): Payer: PPO | Admitting: Family Medicine

## 2014-07-16 VITALS — BP 120/68 | HR 68 | Temp 98.0°F | Resp 16 | Ht 70.0 in | Wt 189.0 lb

## 2014-07-16 DIAGNOSIS — F419 Anxiety disorder, unspecified: Secondary | ICD-10-CM | POA: Diagnosis not present

## 2014-07-16 DIAGNOSIS — E7800 Pure hypercholesterolemia, unspecified: Secondary | ICD-10-CM

## 2014-07-16 DIAGNOSIS — J438 Other emphysema: Secondary | ICD-10-CM

## 2014-07-16 DIAGNOSIS — Z0000 Encounter for general adult medical examination without abnormal findings: Secondary | ICD-10-CM | POA: Diagnosis not present

## 2014-07-16 DIAGNOSIS — Z125 Encounter for screening for malignant neoplasm of prostate: Secondary | ICD-10-CM | POA: Diagnosis not present

## 2014-07-16 MED ORDER — SIMVASTATIN 40 MG PO TABS: 40.0000 mg | ORAL_TABLET | Freq: Every day | ORAL | Status: DC

## 2014-07-16 NOTE — Progress Notes (Signed)
Patient: [name] Male    DOB: February 01, 1945   69 y.o.   MRN: [id number] Visit Date: 07/16/2014  Today's Provider: Lelon Huh, MD   Chief Complaint  Patient presents with  . Annual Exam  . Hyperlipidemia  . Anxiety   Subjective:    HPI    Annual wellness visit [name] is a 69 y.o. male who presents today for his Subsequent Annual Wellness Visit. He feels well. He reports exercising rarely. He reports he is sleeping fairly well.    Follow-up for COPD  Last seen for this 07/04/2012; patient advised smoking cessation. Follow-up for Anxiety 07/14/2013; no changes were made.    Lipid/Cholesterol, Follow-up:   Last seen for this1 years ago.  Management changes since that visit include none. . Last Lipid Panel:    Component Value Date/Time   CHOL 154 07/14/2013   TRIG 78 07/14/2013   HDL 78* 07/14/2013   CHOLHDL 3.4 06/24/2008 0457   VLDL 9 06/24/2008 0457   LDLCALC 60 07/14/2013    Risk factors for vascular disease include cerebral vascular disease  He reports good compliance with treatment. He is not having side effects.  Current symptoms include none and have been unchanged. Weight trend: stable Current exercise: yard work  [employer] from Last 3 Encounters:  07/16/14 189 lb (85.73 kg)  07/14/13 161 lb (73.029 kg)  07/03/13 171 lb (77.565 kg)    -------------------------------------------------------------------  History   Social History  . Marital Status: Single    Spouse Name: [name]  . Number of Children: 0  . Years of Education: 11   Occupational History  . Retired    Social History Main Topics  . Smoking status: Current Every Day Smoker -- 1.00 packs/day  . Smokeless tobacco: Never Used  . Alcohol Use: No  . Drug Use: No  . Sexual Activity: Not on file   Other Topics Concern  . Not on file   Social History Narrative   Patient lives at home with wife.    Patient is retired.    Patient has no children.    Patient  has 11th grade education.     Patient Active Problem List   Diagnosis Date Noted  . Anxiety 07/08/2014  . Breathlessness on exertion 07/08/2014  . Fatigue 07/08/2014  . Memory deficit 07/31/2012  . Alzheimer's disease 03/01/2012  . Bipolar disorder, unspecified 03/01/2012  . Cerebrovascular disease, unspecified 03/01/2012  . Metabolic encephalopathy 40/98/1191  . Unspecified psychosis 03/01/2012  . Other emphysema 03/01/2012  . Other and unspecified hyperlipidemia 03/01/2012  . Restless legs syndrome (RLS) 03/01/2012  . Unspecified hereditary and idiopathic peripheral neuropathy 03/01/2012  . CAFL (chronic airflow limitation) 06/14/2010  . Benign fibroma of prostate 06/08/2009  . Ostium secundum type atrial septal defect 11/17/2008  . Late effects of cerebrovascular disease 07/19/2008  . Bipolar I disorder, single manic episode, moderate 07/14/2008  . 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 Fam hx-ischem heart disease 07/16/2006  . Hypercholesterolemia without hypertriglyceridemia 01/10/2003  . Restless leg 01/10/2003  . Herpesviral infection of perianal skin and rectum 01/10/2000     Past Surgical History  Procedure Laterality Date  . Liver excision      for non-cancerous mass    His family history includes Cancer in his father; Diabetes in his sister; Heart disease in his father, mother, and sister; Seizures in his paternal grandfather.     Previous Medications   ALPRAZOLAM (XANAX) 0.25  MG TABLET    Take 0.25 mg by mouth 2 (two) times daily. Dr. Caryn Section   ASPIRIN 325 MG TABLET    Take 325 mg by mouth daily.   COENZYME Q10 (CO Q-10) 300 MG CAPS    Take 300 mg by mouth daily.   DIVALPROEX (DEPAKOTE ER) 500 MG 24 HR TABLET    Take 2 tablets (1,000 mg total) by mouth every evening.   LORAZEPAM (ATIVAN) 0.5 MG TABLET    Take 0.5 mg by mouth daily. Dr. Katrine Coho FATTY ACIDS (OMEGA-3 FISH OIL) 1200 MG CAPS    Take 1,200 mg by mouth daily.   SERTRALINE  (ZOLOFT) 100 MG TABLET    TAKE 1 AND 1/2 BY MOUTH DAILY -N. MARTIN     Review of Systems  Constitutional: Negative.   HENT: Positive for congestion and hearing loss. Negative for dental problem, drooling, ear discharge, ear pain, facial swelling, mouth sores, nosebleeds, postnasal drip, rhinorrhea, sinus pressure, sneezing, sore throat, tinnitus, trouble swallowing and voice change.   Eyes: Negative.   Cardiovascular: Negative for chest pain, palpitations and leg swelling.  Gastrointestinal: Negative.   Endocrine: Negative.   Genitourinary: Positive for difficulty urinating.  Musculoskeletal: Negative.   Skin: Negative.   Allergic/Immunologic: Negative.   Neurological: Negative.   Hematological: Negative.   Psychiatric/Behavioral: Negative.     History  Substance Use Topics  . Smoking status: Current Every Day Smoker -- 1.00 packs/day  . Smokeless tobacco: Never Used  . Alcohol Use: No    Patient Care Team: Birdie Sons, MD as PCP - General (Family Medicine) Objective:   BP 120/68 mmHg  Pulse 68  Temp(Src) 98 F (36.7 C) (Oral)  Resp 16  Ht '5\' 10"'$  (1.778 m)  Wt 189 lb (85.73 kg)  BMI 27.12 kg/m2  SpO2 97%  Physical Exam   General Appearance:    Alert, cooperative, no distress, appears stated age  Head:    Normocephalic, without obvious abnormality, atraumatic  Eyes:    PERRL, conjunctiva/corneas clear, EOM's intact, fundi    benign, both eyes       Ears:    Normal TM's and external ear canals, both ears  Nose:   Nares normal, septum midline, mucosa normal, no drainage   or sinus tenderness  Throat:   Lips, mucosa, and tongue normal; teeth and gums normal  Neck:   Supple, symmetrical, trachea midline, no adenopathy;       thyroid:  No enlargement/tenderness/nodules; no carotid   bruit or JVD  Back:     Symmetric, no curvature, ROM normal, no CVA tenderness  Lungs:     Clear to auscultation bilaterally, respirations unlabored  Chest wall:    No tenderness or  deformity  Heart:    Regular rate and rhythm, S1 and S2 normal, no murmur, rub   or gallop  Abdomen:     Soft, non-tender, bowel sounds active all four quadrants,    no masses, no organomegaly  Genitalia:    deferred  Rectal:    deferred  Extremities:   Extremities normal, atraumatic, no cyanosis or edema  Pulses:   2+ and symmetric all extremities  Skin:   Skin color, texture, turgor normal, no rashes or lesions  Lymph nodes:   Cervical, supraclavicular, and axillary nodes normal  Neurologic:   CNII-XII intact. Normal strength, sensation and reflexes      throughout      Activities of Daily Living In your present state of health, do  you have any difficulty performing the following activities: 07/16/2014  Hearing? Y  Vision? N  Difficulty concentrating or making decisions? N  Walking or climbing stairs? Y  Dressing or bathing? N  Doing errands, shopping? N    Fall Risk Assessment Fall Risk  07/16/2014  Falls in the past year? No     Depression Screen PHQ 2/9 Scores 07/16/2014  PHQ - 2 Score 1   Cognitive Testing - 6-CIT  Correct? Score   What year is it? yes 0 0 or 4  What month is it? yes 0 0 or 3  Memorize:    [name],  42,  [place],      What time is it? (within 1 hour) yes 0 0 or 3  Count backwards from 20 yes 0 0, 2, or 4  Name the months of the year yes 0 0, 2, or 4  Repeat name & address above yes 4 0, 2, 4, 6, 8, or 10       TOTAL SCORE  4/28   Interpretation:  Normal  Normal (0-7) Abnormal (8-28)   Audit-C Alcohol Use Screening  Question Answer Points  How often do you have alcoholic drink? never 0  How many drinks do you typically consume in a day? never 0  How oftey will you drink 6 or more in a total? never 0  Total Score:  0   A score of 3 or more in women, and 4 or more in men indicates increased risk for alcohol abuse, EXCEPT if all of the points are from question 1.     Assessment & Plan:        Annual Wellness  Visit  Reviewed patient's Family Medical History Reviewed and updated list of patient's medical providers Assessment of cognitive impairment was done Assessed patient's functional ability Established a written schedule for health screening [place] Completed and Reviewed  Exercise Activities and Dietary recommendations Goals    None      Immunization History  Administered Date(s) Administered  . Pneumococcal Conjugate-13 07/14/2013  . Pneumococcal Polysaccharide-23 07/05/2011  . Td 01/09/1993  . Tdap 06/04/2008  . Zoster 06/08/2009    Health Maintenance  Topic Date Due  . INFLUENZA VACCINE  08/10/2014  . TETANUS/TDAP  06/05/2018  . COLONOSCOPY  08/08/2020  . ZOSTAVAX  Completed  . PNA vac Low Risk Adult  Completed        Discussed health benefits of physical activity, and encouraged him to engage in regular exercise appropriate for his age and condition.    ------------------------------------------------------------------------------------------------------------   1. Annual physical exam   2. Prostate cancer screening  - PSA  3. Other emphysema Stable.  - Spirometry with Graph  4. Anxiety Continue current medications.    5. Hypercholesterolemia  - Lipid panel - Comprehensive metabolic panel - TSH

## 2014-07-17 ENCOUNTER — Encounter: Payer: Self-pay | Admitting: Family Medicine

## 2014-07-17 DIAGNOSIS — E038 Other specified hypothyroidism: Secondary | ICD-10-CM | POA: Insufficient documentation

## 2014-07-17 DIAGNOSIS — E039 Hypothyroidism, unspecified: Secondary | ICD-10-CM | POA: Insufficient documentation

## 2014-07-17 LAB — LIPID PANEL
Chol/HDL Ratio: 2.5 ratio units (ref 0.0–5.0)
Cholesterol, Total: 161 mg/dL (ref 100–199)
HDL: 64 mg/dL (ref >39)
LDL Calculated: 77 mg/dL (ref 0–99)
Triglycerides: 98 mg/dL (ref 0–149)
VLDL Cholesterol Cal: 20 mg/dL (ref 5–40)

## 2014-07-17 LAB — COMPREHENSIVE METABOLIC PANEL
ALT: 21 IU/L (ref 0–44)
AST: 25 IU/L (ref 0–40)
Albumin/Globulin Ratio: 1.7 (ref 1.1–2.5)
Albumin: 4.3 g/dL (ref 3.6–4.8)
Alkaline Phosphatase: 60 IU/L (ref 39–117)
BUN/Creatinine Ratio: 12 (ref 10–22)
BUN: 12 mg/dL (ref 8–27)
Bilirubin Total: 0.3 mg/dL (ref 0.0–1.2)
CO2: 24 mmol/L (ref 18–29)
Calcium: 9.6 mg/dL (ref 8.6–10.2)
Chloride: 101 mmol/L (ref 97–108)
Creatinine, Ser: 0.98 mg/dL (ref 0.76–1.27)
GFR calc Af Amer: 91 mL/min/{1.73_m2} (ref >59)
GFR calc non Af Amer: 79 mL/min/{1.73_m2} (ref >59)
Globulin, Total: 2.5 g/dL (ref 1.5–4.5)
Glucose: 88 mg/dL (ref 65–99)
Potassium: 4.6 mmol/L (ref 3.5–5.2)
Sodium: 142 mmol/L (ref 134–144)
Total Protein: 6.8 g/dL (ref 6.0–8.5)

## 2014-07-17 LAB — PSA: Prostate Specific Ag, Serum: 1.7 ng/mL (ref 0.0–4.0)

## 2014-07-17 LAB — TSH: TSH: 7.23 u[IU]/mL — ABNORMAL HIGH (ref 0.450–4.500)

## 2015-03-22 DIAGNOSIS — F310 Bipolar disorder, current episode hypomanic: Secondary | ICD-10-CM | POA: Diagnosis not present

## 2015-05-10 ENCOUNTER — Other Ambulatory Visit: Payer: Self-pay | Admitting: Family Medicine

## 2015-06-11 DIAGNOSIS — F310 Bipolar disorder, current episode hypomanic: Secondary | ICD-10-CM | POA: Diagnosis not present

## 2015-07-19 ENCOUNTER — Encounter: Payer: Self-pay | Admitting: Family Medicine

## 2015-07-19 ENCOUNTER — Ambulatory Visit (INDEPENDENT_AMBULATORY_CARE_PROVIDER_SITE_OTHER): Payer: PPO | Admitting: Family Medicine

## 2015-07-19 ENCOUNTER — Other Ambulatory Visit: Payer: Self-pay | Admitting: Family Medicine

## 2015-07-19 VITALS — BP 116/68 | HR 64 | Temp 97.9°F | Resp 16 | Ht 70.0 in | Wt 178.0 lb

## 2015-07-19 DIAGNOSIS — F17210 Nicotine dependence, cigarettes, uncomplicated: Secondary | ICD-10-CM | POA: Diagnosis not present

## 2015-07-19 DIAGNOSIS — Z125 Encounter for screening for malignant neoplasm of prostate: Secondary | ICD-10-CM | POA: Diagnosis not present

## 2015-07-19 DIAGNOSIS — E7800 Pure hypercholesterolemia, unspecified: Secondary | ICD-10-CM | POA: Diagnosis not present

## 2015-07-19 DIAGNOSIS — Z0000 Encounter for general adult medical examination without abnormal findings: Secondary | ICD-10-CM

## 2015-07-19 DIAGNOSIS — I6990 Unspecified sequelae of unspecified cerebrovascular disease: Secondary | ICD-10-CM

## 2015-07-19 DIAGNOSIS — F3012 Manic episode without psychotic symptoms, moderate: Secondary | ICD-10-CM | POA: Diagnosis not present

## 2015-07-19 DIAGNOSIS — E039 Hypothyroidism, unspecified: Secondary | ICD-10-CM

## 2015-07-19 DIAGNOSIS — J449 Chronic obstructive pulmonary disease, unspecified: Secondary | ICD-10-CM

## 2015-07-19 DIAGNOSIS — E038 Other specified hypothyroidism: Secondary | ICD-10-CM | POA: Diagnosis not present

## 2015-07-19 NOTE — Progress Notes (Signed)
Patient: [name], Male    DOB: July 10, 1945, 70 y.o.   MRN: [id number] Visit Date: 07/19/2015  Today's Provider: Lelon Huh, MD   Chief Complaint  Patient presents with  . Annual Exam   Subjective:    Annual physical   [name] is a 70 y.o. male. He feels well. He reports exercising some. He reports he is sleeping well. Continues to smoke now a little under 1/2 ppd. Had previously smoked over a ppd since he was teenager.   ----------------------------------------------------------- Wt Readings from Last 3 Encounters:  07/19/15 178 lb (80.74 kg)  07/16/14 189 lb (85.73 kg)  07/14/13 161 lb (73.029 kg)   Follow up Hyperlipidemia Lab Results  Component Value Date   CHOL 161 07/16/2014   HDL 64 07/16/2014   LDLCALC 77 07/16/2014   TRIG 98 07/16/2014   CHOLHDL 2.5 07/16/2014   Is taking simvastatin regularly and tolerating without adverse effects.    Follow up BPAP. Is doing well on current psychotropics managed by Dr. Nicolasa Ducking. No adverse effects of medications.    Review of Systems  Constitutional: Positive for appetite change (Pt reports a loss in appetite for about 3 months.  ).  HENT: Negative.   Eyes: Negative.   Respiratory: Negative.   Cardiovascular: Negative.   Gastrointestinal: Negative.   Endocrine: Negative.   Genitourinary: Negative.   Musculoskeletal: Negative.   Skin: Negative.   Allergic/Immunologic: Negative.   Neurological: Negative.   Hematological: Negative.   Psychiatric/Behavioral: Negative.     Social History   Social History  . Marital Status: Single    Spouse Name: [name]  . Number of Children: 0  . Years of Education: 11   Occupational History  . Retired    Social History Main Topics  . Smoking status: Current Every Day Smoker -- 1.00 packs/day  . Smokeless tobacco: Never Used  . Alcohol Use: No  . Drug Use: No  . Sexual Activity: Not on file   Other Topics Concern  . Not on file   Social History  Narrative   Patient lives at home with wife.    Patient is retired.    Patient has no children.    Patient has 11th grade education.     Past Medical History  Diagnosis Date  . Episodic confusion     Possible bipolar disorder  . GERD (gastroesophageal reflux disease)   . Peripheral neuropathy ([place])   . RLS (restless legs syndrome)   . Dyslipidemia   . Cerebrovascular disease   . Emphysema   . Patent foramen ovale   . History of anxiety   . OCD (obsessive compulsive disorder)   . Memory deficit 07/31/2012  . Bipolar disorder with severe mania ([place])   . Pure hypercholesterolemia 01/10/2003  . Anxiety   . Restless leg syndrome 01/10/2003  . BPH (benign prostatic hyperplasia) 06/08/2009  . Dyspnea on exertion   . Fatigue   . History of chicken pox      Patient Active Problem List   Diagnosis Date Noted  . Subclinical hypothyroidism 07/17/2014  . Anxiety 07/08/2014  . Dyspnea on exertion 07/08/2014  . Memory deficit 07/31/2012  . Bipolar disorder, unspecified ([place]) 03/01/2012  . Cerebrovascular disease, unspecified 03/01/2012  . Unspecified psychosis 03/01/2012  . Restless legs syndrome (RLS) 03/01/2012  . Unspecified hereditary and idiopathic peripheral neuropathy 03/01/2012  . COPD (chronic obstructive pulmonary disease) ([place]) 06/14/2010  . BPH (benign prostatic hyperplasia) 06/08/2009  . Ostium  secundum type atrial septal defect 11/17/2008  . Late effects of cerebrovascular disease 07/19/2008  . Bipolar I disorder, single manic episode, moderate ([place]) 07/14/2008  . Fam hx-ischem heart disease 07/16/2006  . Hypercholesterolemia 01/10/2003  . Herpesviral infection of perianal skin and rectum 01/10/2000    Past Surgical History  Procedure Laterality Date  . Liver excision      for non-cancerous mass    His family history includes Cancer in his father; Diabetes in his sister; Heart disease in his father, mother, and sister; Seizures in his paternal grandfather.     Current Meds  Medication Sig  . aspirin 325 MG tablet Take 325 mg by mouth daily.  . Coenzyme Q10 (CO Q-10) 300 MG CAPS Take 300 mg by mouth daily.  . divalproex (DEPAKOTE ER) 500 MG 24 hr tablet Take 2 tablets (1,000 mg total) by mouth every evening.  . sertraline (ZOLOFT) 100 MG tablet TAKE 1 AND 1/2 BY MOUTH DAILY -N. MARTIN  . simvastatin (ZOCOR) 40 MG tablet TAKE 1 TABLET (40 MG TOTAL) BY MOUTH DAILY.  . ziprasidone (GEODON) 40 MG capsule TK 2 CS PO D WITH DINNER    Patient Care Team: Birdie Sons, MD as PCP - General (Family Medicine) Kathrynn Ducking, MD as Consulting Physician (Neurology) Chauncey Mann, MD as Referring Physician (Psychiatry) Vladimir Crofts, MD (Neurology)    Objective:   Vitals: BP 116/68 mmHg  Pulse 64  Temp(Src) 97.9 F (36.6 C) (Oral)  Resp 16  Ht '5\' 10"'$  (1.778 m)  Wt 178 lb (80.74 kg)  BMI 25.54 kg/m2  Physical Exam  Activities of Daily Living In your present state of health, do you have any difficulty performing the following activities: 07/19/2015  Hearing? Y  Vision? N  Difficulty concentrating or making decisions? N  Walking or climbing stairs? Y  Dressing or bathing? N  Doing errands, shopping? N    Fall Risk Assessment Fall Risk  07/19/2015 07/16/2014  Falls in the past year? No No     Depression Screen PHQ 2/9 Scores 07/19/2015 07/16/2014  PHQ - 2 Score 0 1    Cognitive Testing - 6-CIT  Correct? Score   What year is it? Yes 0 0 or 4  What month is it? yes 0 0 or 3  Memorize:    [name],  42,  [address]  [place],      What time is it? (within 1 hour) yes 0 0 or 3  Count backwards from 20 yes 0 0, 2, or 4  Name the months of the year yes 0 0, 2, or 4  Repeat name & address above yes 0 0, 2, 4, 6, 8, or 10       TOTAL SCORE  0/28   Interpretation:  Normal  Normal (0-7) Abnormal (8-28)       Assessment & Plan:     Annual physical Reviewed patient's Family Medical History Reviewed and updated list of patient's  medical providers Assessment of cognitive impairment was done Assessed patient's functional ability Established a written schedule for health screening [place] Completed and Reviewed  Exercise Activities and Dietary recommendations Goals    None      Immunization History  Administered Date(s) Administered  . Pneumococcal Conjugate-13 07/14/2013  . Pneumococcal Polysaccharide-23 07/05/2011  . Tdap 06/04/2008  . Zoster 06/08/2009    Health Maintenance  Topic Date Due  . Hepatitis C Screening  1945-09-12  . INFLUENZA VACCINE  08/10/2015  . TETANUS/TDAP  06/05/2018  . COLONOSCOPY  08/08/2020  . ZOSTAVAX  Completed  . PNA vac Low Risk Adult  Completed      Discussed health benefits of physical activity, and encouraged him to engage in regular exercise appropriate for his age and condition.    ------------------------------------------------------------------------------------------------------------  1. Annual physical exam Generally doing well.   2. Chronic obstructive pulmonary disease, unspecified COPD type ([place]) Currently asymptomatic - EKG 12-Lead  3. Hypercholesterolemia He is tolerating simvastatin well with no adverse effects.   - Lipid panel - Hepatic function panel - CBC - EKG 12-Lead  4. Late effects of cerebrovascular disease  - EKG 12-Lead  5. Subclinical hypothyroidism  - T4 AND TSH  6. Prostate cancer screening  - PSA  7. Bipolar I disorder, single manic episode, moderate ([facility]) Well controlled managed by Dr. Nicolasa Ducking.  - Renal function panel - EKG 12-Lead  8. Smoking greater than 30 pack years Counseled on benefits of smoking cessation. Consider underlying psychiatric conditions He is not candidate for Chantix, and probably not good candidate for Zyban.  He is agreeable to starting annual LDCT screening.  - CT CHEST LUNG CANCER SCREENING LOW DOSE WO CONTRAST; Future   Lelon Huh, MD  [facility]

## 2015-07-20 LAB — CBC
Hematocrit: 40.4 % (ref 37.5–51.0)
Hemoglobin: 13.7 g/dL (ref 12.6–17.7)
MCH: 32.6 pg (ref 26.6–33.0)
MCHC: 33.9 g/dL (ref 31.5–35.7)
MCV: 96 fL (ref 79–97)
Platelets: 177 10*3/uL (ref 150–379)
RBC: 4.2 x10E6/uL (ref 4.14–5.80)
RDW: 14.3 % (ref 12.3–15.4)
WBC: 6 10*3/uL (ref 3.4–10.8)

## 2015-07-20 LAB — HEPATIC FUNCTION PANEL
ALT: 11 IU/L (ref 0–44)
AST: 17 IU/L (ref 0–40)
Alkaline Phosphatase: 58 IU/L (ref 39–117)
Bilirubin Total: 0.3 mg/dL (ref 0.0–1.2)
Bilirubin, Direct: 0.1 mg/dL (ref 0.00–0.40)
Total Protein: 6.7 g/dL (ref 6.0–8.5)

## 2015-07-20 LAB — RENAL FUNCTION PANEL
Albumin: 4.1 g/dL (ref 3.6–4.8)
BUN/Creatinine Ratio: 11 (ref 10–24)
BUN: 11 mg/dL (ref 8–27)
CO2: 25 mmol/L (ref 18–29)
Calcium: 9.4 mg/dL (ref 8.6–10.2)
Chloride: 100 mmol/L (ref 96–106)
Creatinine, Ser: 1.03 mg/dL (ref 0.76–1.27)
GFR calc Af Amer: 85 mL/min/{1.73_m2} (ref >59)
GFR calc non Af Amer: 74 mL/min/{1.73_m2} (ref >59)
Glucose: 81 mg/dL (ref 65–99)
Phosphorus: 3.9 mg/dL (ref 2.5–4.5)
Potassium: 5.2 mmol/L (ref 3.5–5.2)
Sodium: 141 mmol/L (ref 134–144)

## 2015-07-20 LAB — T4 AND TSH
T4, Total: 5.5 ug/dL (ref 4.5–12.0)
TSH: 3.92 u[IU]/mL (ref 0.450–4.500)

## 2015-07-20 LAB — LIPID PANEL
Chol/HDL Ratio: 2.2 ratio units (ref 0.0–5.0)
Cholesterol, Total: 146 mg/dL (ref 100–199)
HDL: 66 mg/dL (ref >39)
LDL Calculated: 63 mg/dL (ref 0–99)
Triglycerides: 86 mg/dL (ref 0–149)
VLDL Cholesterol Cal: 17 mg/dL (ref 5–40)

## 2015-07-20 LAB — PSA: Prostate Specific Ag, Serum: 1.7 ng/mL (ref 0.0–4.0)

## 2015-07-21 DIAGNOSIS — F310 Bipolar disorder, current episode hypomanic: Secondary | ICD-10-CM | POA: Diagnosis not present

## 2015-07-23 ENCOUNTER — Telehealth: Payer: Self-pay

## 2015-07-23 NOTE — Telephone Encounter (Signed)
-----   Message from Birdie Sons, MD sent at 07/23/2015 11:00 AM EDT ----- Labs are all very good. Continue current medications.  Check labs yearly.

## 2015-07-23 NOTE — Telephone Encounter (Signed)
Pt advised.   Thanks,   -Cecilia Nishikawa  

## 2015-08-02 ENCOUNTER — Telehealth: Payer: Self-pay | Admitting: *Deleted

## 2015-08-02 NOTE — Telephone Encounter (Signed)
Received referral for initial lung cancer screening scan. Contacted patient and obtained smoking history,(current, 50 pack year) as well as answering questions related to screening process. Patient denies signs of lung cancer such as weight loss or hemoptysis. Patient denies comorbidity that would prevent curative treatment if lung cancer were found. Patient is tentatively scheduled for shared decision making visit and CT scan on Aug. 1st at 1:30pm, pending insurance approval from business office.

## 2015-08-10 ENCOUNTER — Telehealth: Payer: Self-pay

## 2015-08-10 ENCOUNTER — Ambulatory Visit
Admission: RE | Admit: 2015-08-10 | Discharge: 2015-08-10 | Disposition: A | Discharge status: Home / Self Care | Payer: PPO | Source: Ambulatory Visit | Attending: Oncology | Admitting: Oncology

## 2015-08-10 ENCOUNTER — Other Ambulatory Visit: Payer: Self-pay | Admitting: *Deleted

## 2015-08-10 ENCOUNTER — Inpatient Hospital Stay: Payer: PPO | Attending: Oncology | Admitting: Oncology

## 2015-08-10 ENCOUNTER — Encounter: Payer: Self-pay | Admitting: Oncology

## 2015-08-10 DIAGNOSIS — J439 Emphysema, unspecified: Secondary | ICD-10-CM | POA: Diagnosis not present

## 2015-08-10 DIAGNOSIS — Z87891 Personal history of nicotine dependence: Secondary | ICD-10-CM | POA: Diagnosis not present

## 2015-08-10 DIAGNOSIS — Z122 Encounter for screening for malignant neoplasm of respiratory organs: Secondary | ICD-10-CM | POA: Diagnosis not present

## 2015-08-10 DIAGNOSIS — I2510 Atherosclerotic heart disease of native coronary artery without angina pectoris: Secondary | ICD-10-CM | POA: Diagnosis not present

## 2015-08-10 DIAGNOSIS — K449 Diaphragmatic hernia without obstruction or gangrene: Secondary | ICD-10-CM | POA: Diagnosis not present

## 2015-08-10 NOTE — Telephone Encounter (Signed)
Erroneous telephone encounter.

## 2015-08-11 ENCOUNTER — Encounter: Payer: Self-pay | Admitting: *Deleted

## 2015-08-11 NOTE — Progress Notes (Unsigned)
Patient had lung cancer screening scan 08/10/15 with suspicious lung rads 4B finding. Message sent to PCP with plan to present at conference tomorrow and proceed with recommendation from thoracic group if agreeable with PCP and patient. Will follow.

## 2015-08-13 ENCOUNTER — Telehealth: Payer: Self-pay | Admitting: *Deleted

## 2015-08-13 ENCOUNTER — Other Ambulatory Visit: Payer: Self-pay | Admitting: *Deleted

## 2015-08-13 DIAGNOSIS — R918 Other nonspecific abnormal finding of lung field: Secondary | ICD-10-CM

## 2015-08-13 NOTE — Telephone Encounter (Signed)
Notified patient of LDCT lung cancer screening results with recommendation from multidisiplinary thoracic conference for PET scan and PFT's. Also notified of incidental finding noted below. Patient verbalizes understanding.   IMPRESSION: 1. Lung-RADS Category 4B, suspicious. Additional imaging evaluation such as PET-CT or consultation with pulmonary medicine or thoracic surgery recommended. 2. Emphysema. 3. Coronary artery atherosclerosis. 4. Small to moderate hiatal hernia. These results will be called to the ordering clinician or representative by the Radiologist Assistant, and communication documented in the PACS or zVision Dashboard.

## 2015-08-13 NOTE — Progress Notes (Addendum)
In accordance with CMS guidelines, patient has met eligibility criteria including age, absence of signs or symptoms of lung cancer.  Social History  Substance Use Topics  . Smoking status: Current Every Day Smoker    Packs/day: 1.00    Years: 50.00    Types: Cigarettes  . Smokeless tobacco: Never Used  . Alcohol use Yes     Comment: Occasional     A shared decision-making session was conducted prior to the performance of CT scan. This includes one or more decision aids, includes benefits and harms of screening, follow-up diagnostic testing, over-diagnosis, false positive rate, and total radiation exposure.  Counseling on the importance of adherence to annual lung cancer LDCT screening, impact of co-morbidities, and ability or willingness to undergo diagnosis and treatment is imperative for compliance of the program.  Counseling on the importance of continued smoking cessation for former smokers; the importance of smoking cessation for current smokers, and information about tobacco cessation interventions have been given to patient including [place] Quit [facility] and 1800 quit [place] programs.  Written order for lung cancer screening with LDCT has been given to the patient and any and all questions have been answered to the best of my abilities.   Yearly follow up will be coordinated by Shawn Perkins, Thoracic Navigator.     

## 2015-08-17 ENCOUNTER — Ambulatory Visit: Payer: PPO | Attending: Oncology

## 2015-08-17 DIAGNOSIS — J449 Chronic obstructive pulmonary disease, unspecified: Secondary | ICD-10-CM | POA: Diagnosis not present

## 2015-08-17 DIAGNOSIS — R918 Other nonspecific abnormal finding of lung field: Secondary | ICD-10-CM

## 2015-08-18 ENCOUNTER — Ambulatory Visit
Admission: RE | Admit: 2015-08-18 | Discharge: 2015-08-18 | Disposition: A | Discharge status: Home / Self Care | Payer: PPO | Source: Ambulatory Visit | Attending: Oncology | Admitting: Oncology

## 2015-08-18 DIAGNOSIS — I2510 Atherosclerotic heart disease of native coronary artery without angina pectoris: Secondary | ICD-10-CM | POA: Diagnosis not present

## 2015-08-18 DIAGNOSIS — R918 Other nonspecific abnormal finding of lung field: Secondary | ICD-10-CM | POA: Diagnosis not present

## 2015-08-18 DIAGNOSIS — R911 Solitary pulmonary nodule: Secondary | ICD-10-CM | POA: Diagnosis not present

## 2015-08-18 DIAGNOSIS — K449 Diaphragmatic hernia without obstruction or gangrene: Secondary | ICD-10-CM | POA: Diagnosis not present

## 2015-08-18 DIAGNOSIS — I771 Stricture of artery: Secondary | ICD-10-CM | POA: Insufficient documentation

## 2015-08-18 DIAGNOSIS — I700 Atherosclerosis of aorta: Secondary | ICD-10-CM | POA: Insufficient documentation

## 2015-08-18 LAB — GLUCOSE, CAPILLARY: Glucose-Capillary: 88 mg/dL (ref 65–99)

## 2015-08-18 MED ORDER — FLUDEOXYGLUCOSE F - 18 (FDG) INJECTION
12.6000 | Freq: Once | INTRAVENOUS | Status: AC | PRN
  Administered 2015-08-18: 12.6 via INTRAVENOUS

## 2015-08-19 ENCOUNTER — Other Ambulatory Visit: Payer: Self-pay | Admitting: *Deleted

## 2015-08-20 ENCOUNTER — Telehealth: Payer: Self-pay | Admitting: *Deleted

## 2015-08-20 LAB — BLOOD GAS, ARTERIAL
Acid-Base Excess: 1.1 mmol/L (ref 0.0–3.0)
Allens test (pass/fail): POSITIVE — AB
Bicarbonate: 25.1 mEq/L (ref 21.0–28.0)
FIO2: 21
O2 Saturation: 97.3 %
Patient temperature: 37
pCO2 arterial: 37 mmHg (ref 32.0–48.0)
pH, Arterial: 7.44 (ref 7.350–7.450)
pO2, Arterial: 90 mmHg (ref 83.0–108.0)

## 2015-08-20 NOTE — Telephone Encounter (Signed)
Reviewed PET and PFT results with patient. Reviewed recent discussions with Dr. [name] and Thoracic Conference recommendations. Gave appointment to patient to see Dr. [name] on Tuesday 08/24/15 at 10:15am with 9:45am arrival. Patient verbalizes understanding and agreement with plan of care.

## 2015-08-24 ENCOUNTER — Ambulatory Visit
Admission: RE | Admit: 2015-08-24 | Discharge: 2015-08-24 | Disposition: A | Discharge status: Home / Self Care | Payer: PPO | Source: Ambulatory Visit | Attending: Cardiothoracic Surgery | Admitting: Cardiothoracic Surgery

## 2015-08-24 ENCOUNTER — Encounter: Payer: Self-pay | Admitting: Cardiothoracic Surgery

## 2015-08-24 ENCOUNTER — Ambulatory Visit (INDEPENDENT_AMBULATORY_CARE_PROVIDER_SITE_OTHER): Payer: PPO | Admitting: Cardiothoracic Surgery

## 2015-08-24 VITALS — BP 116/76 | HR 66 | Temp 97.8°F | Resp 20 | Ht 71.0 in | Wt 176.0 lb

## 2015-08-24 DIAGNOSIS — R918 Other nonspecific abnormal finding of lung field: Secondary | ICD-10-CM | POA: Diagnosis not present

## 2015-08-24 DIAGNOSIS — R911 Solitary pulmonary nodule: Secondary | ICD-10-CM

## 2015-08-24 NOTE — Progress Notes (Signed)
Patient ID: [name], male   DOB: Dec 15, 1945, 70 y.o.   MRN: [id number]  Chief Complaint  Patient presents with  . New Patient (Initial Visit)    Left Upper Lobe Nodule    Referred By Dr. Grayland Ormond Reason for Referral left upper lobe mass  HPI Location, Quality, Duration, Severity, Timing, Context, Modifying Factors, Associated Signs and Symptoms.  [name] is a 70 y.o. male.  He was in his usual state of health when his family physician obtained a low-dose CT scan for lung cancer screening. This revealed a possible mass in the left upper lobe and he was subsequently referred to Dr. Grayland Ormond. Dr. Grayland Ormond reviewed the patient's history and obtained a PET scan and pulmonary function studies. PET scan showed uptake in the left upper lobe area. This was thought to be consistent with a stage I carcinoma the lung. The patient is a lifelong smoker having smoked at least one pack cigarettes per day for the last 50 years. He states he does not get short of breath unless he has exerted himself to the extreme. However he is relatively sedentary in his lifestyle. He is a retired Administrator. He denied any fevers, chills, weight loss, hemoptysis, wheezing. Of note is that in 2005 he underwent a left thoracotomy by Dr. Jearld Fenton at [facility]. The patient believes that the pathology showed a fatty tumor. The current abnormality is in the central portion of the prior surgical resection site.   Past Medical History:  Diagnosis Date  . Anxiety   . Bipolar disorder with severe mania ([place])   . BPH (benign prostatic hyperplasia) 06/08/2009  . Cerebrovascular disease   . Dyslipidemia   . Dyspnea on exertion   . Emphysema   . Episodic confusion    Possible bipolar disorder  . Fatigue   . GERD (gastroesophageal reflux disease)   . History of anxiety   . History of chicken pox   . Memory deficit 07/31/2012  . OCD (obsessive compulsive disorder)   . Patent foramen ovale   . Peripheral neuropathy  ([place])   . Pure hypercholesterolemia 01/10/2003  . Restless leg syndrome 01/10/2003  . RLS (restless legs syndrome)     Past Surgical History:  Procedure Laterality Date  . liver excision     for non-cancerous mass  . LUNG SURGERY  2005   Lipoma Removal    Family History  Problem Relation Age of Onset  . Heart disease Mother   . Cancer Father   . Heart disease Father   . Diabetes Sister   . Heart disease Sister   . Seizures Paternal Grandfather     Social History Social History  Substance Use Topics  . Smoking status: Current Every Day Smoker    Packs/day: 1.00    Years: 50.00    Types: Cigarettes  . Smokeless tobacco: Never Used  . Alcohol use Yes     Comment: Occasional    Allergies  Allergen Reactions  . Ropinirole Hcl Other (See Comments)    Confusion, agiitation, disorientation, Hallucinations    Current Outpatient Prescriptions  Medication Sig Dispense Refill  . aspirin 325 MG tablet Take 325 mg by mouth daily.    . Coenzyme Q10 (CO Q-10) 300 MG CAPS Take 300 mg by mouth daily.    . divalproex (DEPAKOTE ER) 500 MG 24 hr tablet Take 2 tablets (1,000 mg total) by mouth every evening. 180 tablet 0  . Omega-3 Fatty Acids (OMEGA-3 FISH OIL) 1200 MG CAPS  Take 1,200 mg by mouth daily. Reported on 07/19/2015    . sertraline (ZOLOFT) 100 MG tablet TAKE 1 AND 1/2 BY MOUTH DAILY -N. MARTIN 135 tablet 0  . simvastatin (ZOCOR) 40 MG tablet TAKE 1 TABLET BY MOUTH EVERY DAY 90 tablet 4  . ziprasidone (GEODON) 40 MG capsule TK 2 CS PO D WITH DINNER  1   No current facility-administered medications for this visit.       Review of Systems A complete review of systems was asked and was negative except for the following positive findingsFrequent cough  Blood pressure 116/76, pulse 66, temperature 97.8 F (36.6 C), temperature source Oral, resp. rate 20, height '5\' 11"'$  (1.803 m), weight 176 lb (79.8 kg), SpO2 98 %.  Physical Exam CONSTITUTIONAL:  Pleasant,  well-developed, well-nourished, and in no acute distress. EYES: Pupils equal and reactive to light, Sclera non-icteric EARS, NOSE, MOUTH AND THROAT:  The oropharynx was clear.  Dentures present.  Oral mucosa pink and moist. LYMPH NODES:  Lymph nodes in the neck and axillae were normal RESPIRATORY:  Lungs were clear.  Normal respiratory effort without pathologic use of accessory muscles of respiration CARDIOVASCULAR: Heart was regular without murmurs.  There were no carotid bruits. GI: The abdomen was soft, nontender, and nondistended. There were no palpable masses. There was no hepatosplenomegaly. There were normal bowel sounds in all quadrants. GU:  Rectal deferred.   MUSCULOSKELETAL:  Normal muscle strength and tone.  No clubbing or cyanosis.   SKIN:  There were no pathologic skin lesions.  There were no nodules on palpation. NEUROLOGIC:  Sensation is normal.  Cranial nerves are grossly intact. PSYCH:  Oriented to person, place and time.  Mood and affect are normal.  Data Reviewed I have reviewed the CT scans and PET scans  I have personally reviewed the patient's imaging, laboratory findings and medical records.    Assessment    I have independently reviewed the patient's CT scan and PET scan. Of also reviewed the CT scan from 2005. There was a left upper lobe mass present which on subsequent plain films shows that it has been resected. There are no intervening chest x-rays or CT scans of the chest.    Plan    There is a left upper lobe mass which is suspicious for a bronchogenic carcinoma. However this is in the resection bed of a prior benign tumor of the lung. Although possible, it appears unlikely that a tumor would occur at the staple line given it's been almost 12 years since a benign surgical resection. Therefore I do believe that would be helpful to obtain a CT-guided needle biopsy. I've asked him to stop taking his aspirin and fish oil. We will obtain a CT-guided needle biopsy.  We will obtain the records from [facility]. I will discuss his care once we have the results of the biopsy and prior operative reports.       [name], MD 08/24/2015, 11:15 AM

## 2015-08-24 NOTE — Patient Instructions (Addendum)
Please stop your Aspirin and Fish Oil today. We will speak about resuming this after surgery.  We will obtain the records from your previous surgery.  We will send you to the [facility] for a Chest X-ray today. You will check-in at the Registration desk.  Directions to Medical Mall: When leaving our office, go right. Go all of the way down to the very end of the hallway. You will have a purple wall in front of you. You will now have a tunnel to the hospital on your left hand side. Go through this tunnel and the elevators will be on your left. Go down to the 1st floor and take a slight left. The very first desk on the right hand side is the registration desk.  We will also set-up your CT guided biopsy. I will call you with the details of this appointment.  We recommend that you suggest QUIT SMOKING! I have enclosed a list of tips to help with this. If you are in need of gum, patches, etc. Please call your Primary Care Physician.    Smoking Cessation, Tips for Success If you are ready to quit smoking, congratulations! You have chosen to help yourself be healthier. Cigarettes bring nicotine, tar, carbon monoxide, and other irritants into your body. Your lungs, heart, and blood vessels will be able to work better without these poisons. There are many different ways to quit smoking. Nicotine gum, nicotine patches, a nicotine inhaler, or nicotine nasal spray can help with physical craving. Hypnosis, support groups, and medicines help break the habit of smoking. WHAT THINGS CAN I DO TO MAKE QUITTING EASIER?  Here are some tips to help you quit for good:  Pick a date when you will quit smoking completely. Tell all of your friends and family about your plan to quit on that date.  Do not try to slowly cut down on the number of cigarettes you are smoking. Pick a quit date and quit smoking completely starting on that day.  Throw away all cigarettes.   Clean and remove all ashtrays from your home,  work, and car.  On a card, write down your reasons for quitting. Carry the card with you and read it when you get the urge to smoke.  Cleanse your body of nicotine. Drink enough water and fluids to keep your urine clear or pale yellow. Do this after quitting to flush the nicotine from your body.  Learn to predict your moods. Do not let a bad situation be your excuse to have a cigarette. Some situations in your life might tempt you into wanting a cigarette.  Never have "just one" cigarette. It leads to wanting another and another. Remind yourself of your decision to quit.  Change habits associated with smoking. If you smoked while driving or when feeling stressed, try other activities to replace smoking. Stand up when drinking your coffee. Brush your teeth after eating. Sit in a different chair when you read the paper. Avoid alcohol while trying to quit, and try to drink fewer caffeinated beverages. Alcohol and caffeine may urge you to smoke.  Avoid foods and drinks that can trigger a desire to smoke, such as sugary or spicy foods and alcohol.  Ask people who smoke not to smoke around you.  Have something planned to do right after eating or having a cup of coffee. For example, plan to take a walk or exercise.  Try a relaxation exercise to calm you down and decrease your stress. Remember, you  may be tense and nervous for the first 2 weeks after you quit, but this will pass.  Find new activities to keep your hands busy. Play with a pen, coin, or rubber band. Doodle or draw things on paper.  Brush your teeth right after eating. This will help cut down on the craving for the taste of tobacco after meals. You can also try mouthwash.   Use oral substitutes in place of cigarettes. Try using lemon drops, carrots, cinnamon sticks, or chewing gum. Keep them handy so they are available when you have the urge to smoke.  When you have the urge to smoke, try deep breathing.  Designate your home as a  nonsmoking area.  If you are a heavy smoker, ask your health care provider about a prescription for nicotine chewing gum. It can ease your withdrawal from nicotine.  Reward yourself. Set aside the cigarette money you save and buy yourself something nice.  Look for support from others. Join a support group or smoking cessation program. Ask someone at home or at work to help you with your plan to quit smoking.  Always ask yourself, "Do I need this cigarette or is this just a reflex?" Tell yourself, "Today, I choose not to smoke," or "I do not want to smoke." You are reminding yourself of your decision to quit.  Do not replace cigarette smoking with electronic cigarettes (commonly called e-cigarettes). The safety of e-cigarettes is unknown, and some may contain harmful chemicals.  If you relapse, do not give up! Plan ahead and think about what you will do the next time you get the urge to smoke. HOW WILL I FEEL WHEN I QUIT SMOKING? You may have symptoms of withdrawal because your body is used to nicotine (the addictive substance in cigarettes). You may crave cigarettes, be irritable, feel very hungry, cough often, get headaches, or have difficulty concentrating. The withdrawal symptoms are only temporary. They are strongest when you first quit but will go away within 10-14 days. When withdrawal symptoms occur, stay in control. Think about your reasons for quitting. Remind yourself that these are signs that your body is healing and getting used to being without cigarettes. Remember that withdrawal symptoms are easier to treat than the major diseases that smoking can cause.  Even after the withdrawal is over, expect periodic urges to smoke. However, these cravings are generally short lived and will go away whether you smoke or not. Do not smoke! WHAT RESOURCES ARE AVAILABLE TO HELP ME QUIT SMOKING? Your health care provider can direct you to community resources or hospitals for support, which may  include:  Group support.  Education.  Hypnosis.  Therapy.   This information is not intended to replace advice given to you by your health care provider. Make sure you discuss any questions you have with your health care provider.   Document Released: 09/24/2003 Document Revised: 01/16/2014 Document Reviewed: 06/13/2012 Elsevier Interactive Patient Education [employer].

## 2015-08-26 DIAGNOSIS — R918 Other nonspecific abnormal finding of lung field: Secondary | ICD-10-CM

## 2015-08-30 ENCOUNTER — Telehealth: Payer: Self-pay

## 2015-08-30 NOTE — Telephone Encounter (Signed)
Call made to Specialty scheduling to schedule patient for CT guided lung biopsy. No answer. Awaiting phone call to schedule.  Patient has stopped taking his ASA and Fish Oil on 08/25/15.

## 2015-08-30 NOTE — Telephone Encounter (Signed)
[name] returned my phone call at this time. She states that this is under review by a radiologist at this time and as soon as he is agreeable to do biopsy, they will contact patient for scheduling.

## 2015-09-01 NOTE — Telephone Encounter (Signed)
Contacted [facility] [employer]) in the AM and asked if patient's CT Guided Lung Biopsy was scheduled. She stated that Dr. Kathlene Cote (Radiologist) was still reviewing patient's chart. She stated to please give her some time and hopefully this would be scheduled later on in the day. She stated that she would contact patient as soon as she gets the approval from Dr. Kathlene Cote. I just looked into patient's chart and saw that this was finally scheduled for this Friday 09/03/2015.  I then contacted patient to schedule a follow up appointment for him to come in and see Dr. Genevive Bi on Friday 09/10/2015 at 9:00 AM so they could go over results.

## 2015-09-02 ENCOUNTER — Other Ambulatory Visit: Payer: Self-pay | Admitting: Radiology

## 2015-09-03 ENCOUNTER — Ambulatory Visit (HOSPITAL_COMMUNITY)
Admission: RE | Admit: 2015-09-03 | Discharge: 2015-09-03 | Disposition: A | Discharge status: Home / Self Care | Payer: PPO | Source: Ambulatory Visit | Attending: Cardiothoracic Surgery | Admitting: Cardiothoracic Surgery

## 2015-09-03 ENCOUNTER — Ambulatory Visit (HOSPITAL_COMMUNITY)
Admission: RE | Admit: 2015-09-03 | Discharge: 2015-09-03 | Disposition: A | Discharge status: Home / Self Care | Payer: PPO | Source: Ambulatory Visit | Attending: Interventional Radiology | Admitting: Interventional Radiology

## 2015-09-03 ENCOUNTER — Encounter (HOSPITAL_COMMUNITY): Payer: Self-pay

## 2015-09-03 DIAGNOSIS — J850 Gangrene and necrosis of lung: Secondary | ICD-10-CM | POA: Diagnosis not present

## 2015-09-03 DIAGNOSIS — J939 Pneumothorax, unspecified: Secondary | ICD-10-CM | POA: Insufficient documentation

## 2015-09-03 DIAGNOSIS — Z9889 Other specified postprocedural states: Secondary | ICD-10-CM | POA: Insufficient documentation

## 2015-09-03 DIAGNOSIS — J95811 Postprocedural pneumothorax: Secondary | ICD-10-CM

## 2015-09-03 DIAGNOSIS — R911 Solitary pulmonary nodule: Secondary | ICD-10-CM | POA: Diagnosis not present

## 2015-09-03 DIAGNOSIS — R918 Other nonspecific abnormal finding of lung field: Secondary | ICD-10-CM | POA: Diagnosis not present

## 2015-09-03 LAB — CBC
HCT: 40.8 % (ref 39.0–52.0)
Hemoglobin: 13.4 g/dL (ref 13.0–17.0)
MCH: 32.6 pg (ref 26.0–34.0)
MCHC: 32.8 g/dL (ref 30.0–36.0)
MCV: 99.3 fL (ref 78.0–100.0)
Platelets: 144 10*3/uL — ABNORMAL LOW (ref 150–400)
RBC: 4.11 MIL/uL — ABNORMAL LOW (ref 4.22–5.81)
RDW: 13.2 % (ref 11.5–15.5)
WBC: 7.7 10*3/uL (ref 4.0–10.5)

## 2015-09-03 LAB — PROTIME-INR
INR: 1.06
Prothrombin Time: 13.8 seconds (ref 11.4–15.2)

## 2015-09-03 LAB — APTT: aPTT: 28 seconds (ref 24–36)

## 2015-09-03 MED ORDER — FENTANYL CITRATE (PF) 100 MCG/2ML IJ SOLN
INTRAMUSCULAR | Status: AC
  Filled 2015-09-03: qty 2

## 2015-09-03 MED ORDER — SODIUM CHLORIDE 0.9 % IV SOLN: INTRAVENOUS | Status: DC

## 2015-09-03 MED ORDER — FENTANYL CITRATE (PF) 100 MCG/2ML IJ SOLN
INTRAMUSCULAR | Status: AC | PRN
  Administered 2015-09-03: 50 ug via INTRAVENOUS

## 2015-09-03 MED ORDER — MIDAZOLAM HCL 2 MG/2ML IJ SOLN
INTRAMUSCULAR | Status: AC | PRN
  Administered 2015-09-03: 1 mg via INTRAVENOUS

## 2015-09-03 MED ORDER — LIDOCAINE HCL 1 % IJ SOLN
INTRAMUSCULAR | Status: AC
  Filled 2015-09-03: qty 20

## 2015-09-03 MED ORDER — SODIUM CHLORIDE 0.9 % IV SOLN
INTRAVENOUS | Status: AC | PRN
  Administered 2015-09-03: 10 mL/h via INTRAVENOUS

## 2015-09-03 MED ORDER — MIDAZOLAM HCL 2 MG/2ML IJ SOLN
INTRAMUSCULAR | Status: AC
  Filled 2015-09-03: qty 2

## 2015-09-03 NOTE — Sedation Documentation (Signed)
Patient is resting comfortably. 

## 2015-09-03 NOTE — Progress Notes (Signed)
Patient ID: [name], male   DOB: 07/02/1945, 70 y.o.   MRN: [id number]   Pt doing well post procedure CXR: "Possible small pneumothorax at the left costophrenic angle" Will get followup erect PA chest in 1 hr to assess.  If stable, may still be ok for d/c today.

## 2015-09-03 NOTE — Procedures (Signed)
CT LEFT lung core bx 18g x3 to surg path Sm ptx, asx.   No blood loss. See complete dictation in [facility].

## 2015-09-03 NOTE — Discharge Instructions (Signed)

## 2015-09-03 NOTE — Sedation Documentation (Addendum)
Patient denies pain and is resting comfortably. Dr Vernard Gambles spoke to family and they are now at his side. On stretcher with left side down as per Dr Vernard Gambles

## 2015-09-03 NOTE — H&P (Signed)
Chief Complaint: Patient was seen in consultation today for left lung mass biopsy at the request of [name],Kevin  Referring Physician(s): [name],Kevin  Supervising Physician: Arne Cleveland  Patient Status: Outpatient  History of Present Illness: [name] is a 70 y.o. male   ++smoker Pt was seen for annual PE - MD suggested CT screening for Ca Revealed Left lung mass PET 08/18/15: IMPRESSION: 1. Hypermetabolic spiculated left upper lobe lung nodule, most consistent with primary bronchogenic carcinoma. No thoracic nodal or extrathoracic hypermetabolic metastasis. Presuming non-small-cell histology, this is most consistent with T1bN0M0 or stage IA. 2. Coronary artery atherosclerosis. Aortic atherosclerosis. Significant left carotid atherosclerosis with tortuosity and possible ectasia. 3. Moderate hiatal hernia.  Hx Left thorocotomy 2005 - Dr Arlyce Dice-  FINAL DIAGNOSIS MICROSCOPIC EXAMINATION AND DIAGNOSIS 1. LEFT LUNG, UPPER LOBE, WEDGE BIOPSY: - ABSCESS. - NO TUMOR IDENTIFIED. CHRONIC INFLAMMATION AND FIBROSIS  New lesion in same area Request for biopsy per MD   Past Medical History:  Diagnosis Date  . Anxiety   . Bipolar disorder with severe mania ([place])   . BPH (benign prostatic hyperplasia) 06/08/2009  . Cerebrovascular disease   . Dyslipidemia   . Dyspnea on exertion   . Emphysema   . Episodic confusion    Possible bipolar disorder  . Fatigue   . GERD (gastroesophageal reflux disease)   . History of anxiety   . History of chicken pox   . Memory deficit 07/31/2012  . OCD (obsessive compulsive disorder)   . Patent foramen ovale   . Peripheral neuropathy ([place])   . Pure hypercholesterolemia 01/10/2003  . Restless leg syndrome 01/10/2003  . RLS (restless legs syndrome)     Past Surgical History:  Procedure Laterality Date  . liver excision     for non-cancerous mass  . LUNG SURGERY  2005   Lipoma Removal    Allergies: Ropinirole  hcl  Medications: Prior to Admission medications   Medication Sig Start Date End Date Taking? Authorizing Provider  aspirin 325 MG tablet Take 325 mg by mouth daily.   Yes Historical Provider, MD  Coenzyme Q10 (CO Q-10) 300 MG CAPS Take 300 mg by mouth daily.   Yes Historical Provider, MD  divalproex (DEPAKOTE ER) 500 MG 24 hr tablet Take 2 tablets (1,000 mg total) by mouth every evening. 01/13/14  Yes Kathrynn Ducking, MD  simvastatin (ZOCOR) 40 MG tablet TAKE 1 TABLET BY MOUTH EVERY DAY 07/19/15  Yes Birdie Sons, MD  ziprasidone (GEODON) 40 MG capsule Take 40 mg by mouth at bedtime.   Yes Historical Provider, MD     Family History  Problem Relation Age of Onset  . Heart disease Mother   . Cancer Father   . Heart disease Father   . Diabetes Sister   . Heart disease Sister   . Seizures Paternal Grandfather     Social History   Social History  . Marital status: Single    Spouse name: [name]  . Number of children: 0  . Years of education: 23   Occupational History  . Retired    Social History Main Topics  . Smoking status: Current Every Day Smoker    Packs/day: 1.00    Years: 50.00    Types: Cigarettes  . Smokeless tobacco: Never Used  . Alcohol use Yes     Comment: Occasional  . Drug use: No  . Sexual activity: Not Asked   Other Topics Concern  . None   Social History Narrative  Patient lives at home with wife.    Patient is retired.    Patient has no children.    Patient has 11th grade education.      Review of Systems: A 12 point ROS discussed and pertinent positives are indicated in the HPI above.  All other systems are negative.  Review of Systems  Constitutional: Negative for activity change, appetite change, fatigue and fever.  Respiratory: Positive for cough. Negative for shortness of breath.   Cardiovascular: Negative for chest pain.  Neurological: Negative for weakness.  Psychiatric/Behavioral: Negative for behavioral problems and confusion.     Vital Signs: BP 137/79   Pulse (!) 59   Temp 97.9 F (36.6 C) (Oral)   Resp 16   Ht '5\' 11"'$  (1.803 m)   Wt 175 lb (79.4 kg)   SpO2 100%   BMI 24.41 kg/m   Physical Exam  Constitutional: He is oriented to person, place, and time. He appears well-nourished.  Cardiovascular: Normal rate and regular rhythm.   Pulmonary/Chest: Effort normal. He has wheezes.  Abdominal: Soft. Bowel sounds are normal.  Musculoskeletal: Normal range of motion.  Neurological: He is alert and oriented to person, place, and time.  Skin: Skin is warm and dry.  Psychiatric: He has a normal mood and affect. His behavior is normal. Judgment and thought content normal.  Nursing note and vitals reviewed.   Mallampati Score:  MD Evaluation Airway: WNL Heart: WNL Abdomen: WNL Chest/ Lungs: WNL ASA  Classification: 2 Mallampati/Airway Score: Two  Imaging: Dg Chest 2 View  Result Date: 08/24/2015 CLINICAL DATA:  History of left lung nodule, followup EXAM: CHEST  2 VIEW COMPARISON:  PET-CT of 08/18/2015, CT chest of 08/10/2015, and chest x-ray of 06/14/2010 FINDINGS: The irregular nodular opacity noted by CT chest in the anterior left upper lobe is poorly seen by chest x-ray with vague opacity noted in the mid left upper mid lung field at the prior surgical site. No enlarging lung nodule is seen with certainty by chest x-ray. Left hilar surgical clips are present. The right lung is clear. No pleural effusion is noted. Mediastinal and hilar contours are within normal limits in the heart is normal in size. No bony abnormality is seen. IMPRESSION: Vague opacity remains in the left upper lobe which could represent the nodular opacity noted by CT chest at the prior surgical site. No definite enlarging lung lesion is seen. Recommend continued followup CT chest to assess for interval change in size of this hypermetabolic lesion by PET-CT. Electronically Signed   By: Ivar Drape M.D.   On: 08/24/2015 12:16   Nm Pet  Image Initial (pi) Skull Base To Thigh  Result Date: 08/18/2015 CLINICAL DATA:  Initial treatment strategy for left upper lobe pulmonary nodule on screening CT. EXAM: NUCLEAR MEDICINE PET SKULL BASE TO THIGH TECHNIQUE: 12.6 mCi F-18 FDG was injected intravenously. Full-ring PET imaging was performed from the skull base to thigh after the radiotracer. CT data was obtained and used for attenuation correction and anatomic localization. FASTING BLOOD GLUCOSE:  Value: 88 mg/dl COMPARISON:  Chest CT 08/10/2015. Abdominal pelvic stone study of 09/24/2005. FINDINGS: NECK No areas of abnormal hypermetabolism. CHEST Spiculated left upper lobe pulmonary nodule is hypermetabolic. This measures 1.9 x 2.0 cm and a S.U.V. max of 6.8 on image 93/ series 3. No thoracic nodal hypermetabolism. ABDOMEN/PELVIS Left adrenal hypermetabolism is without CT correlate and favored be physiologic. This measures a S.U.V. max of 3.4. SKELETON No abnormal marrow activity. CT IMAGES PERFORMED  FOR ATTENUATION CORRECTION Marked left carotid atherosclerosis with tortuosity and possible ectasia. Example image 39/series 3. Moderate right carotid atherosclerosis. No cervical adenopathy. Chest findings deferred to recent diagnostic CT. Multivessel coronary artery atherosclerosis. Moderate hiatal hernia. Centrilobular emphysema. Abdominal aortic and branch vessel atherosclerosis. No adrenal mass. Mild prostatomegaly. IMPRESSION: 1. Hypermetabolic spiculated left upper lobe lung nodule, most consistent with primary bronchogenic carcinoma. No thoracic nodal or extrathoracic hypermetabolic metastasis. Presuming non-small-cell histology, this is most consistent with T1bN0M0 or stage IA. 2. Coronary artery atherosclerosis. Aortic atherosclerosis. Significant left carotid atherosclerosis with tortuosity and possible ectasia. 3. Moderate hiatal hernia. Electronically Signed   By: Abigail Miyamoto M.D.   On: 08/18/2015 13:40   Ct Chest Lung Cancer Screening Low  Dose Wo Contrast  Result Date: 08/10/2015 CLINICAL DATA:  70 year old male with 50 pack-year history of smoking. Lung cancer screening. EXAM: CT CHEST WITHOUT CONTRAST LOW-DOSE FOR LUNG CANCER SCREENING TECHNIQUE: Multidetector CT imaging of the chest was performed following the standard protocol without IV contrast. COMPARISON:  Standard CT scan from 01/29/2003. FINDINGS: Cardiovascular: Heart size normal. Coronary artery calcification is noted. No pericardial effusion. No thoracic aortic aneurysm. Mediastinum/Nodes: No mediastinal lymphadenopathy. No evidence for gross hilar lymphadenopathy although assessment is limited by the lack of intravenous contrast on today's study. Surgical clips are noted in the left hilar region. Small to moderate hiatal hernia. Esophagus otherwise unremarkable. Lungs/Pleura: Lung windows show emphysema with bronchial wall thickening. Volume loss in the left upper lobe is compatible with previous surgery. On today's exam, there is irregular left upper lobe nodule with central dystrophic calcification in spiculation. Volume derived mean diameter is 1.7 cm in there is evidence of tethering to the lateral pleura. Other scattered smaller bilateral pulmonary nodules are evident. Upper abdomen: Unremarkable. Musculoskeletal: Bone windows reveal no worrisome lytic or sclerotic osseous lesions. IMPRESSION: 1. Lung-RADS Category 4B, suspicious. Additional imaging evaluation such as PET-CT or consultation with pulmonary medicine or thoracic surgery recommended. 2. Emphysema. 3. Coronary artery atherosclerosis. 4. Small to moderate hiatal hernia. These results will be called to the ordering clinician or representative by the Radiologist Assistant, and communication documented in the PACS or zVision Dashboard. Electronically Signed   By: Misty Stanley M.D.   On: 08/10/2015 15:32    Labs:  CBC:  Recent Labs  07/19/15 1019 09/03/15 0615  WBC 6.0 7.7  HGB  --  13.4  HCT 40.4 40.8  PLT  177 144*    COAGS:  Recent Labs  09/03/15 0615  INR 1.06  APTT 28    BMP:  Recent Labs  07/19/15 1019  NA 141  K 5.2  CL 100  CO2 25  GLUCOSE 81  BUN 11  CALCIUM 9.4  CREATININE 1.03  GFRNONAA 74  GFRAA 85    LIVER FUNCTION TESTS:  Recent Labs  07/19/15 1019  BILITOT 0.3  AST 17  ALT 11  ALKPHOS 58  PROT 6.7  ALBUMIN 4.1    TUMOR MARKERS: No results for input(s): AFPTM, CEA, CA199, CHROMGRNA in the last 8760 hours.  Assessment and Plan:  + smoker CT as per MD for screening revealed LUL mass +PET Now for biopsy per Dr [name] Risks and Benefits discussed with the patient including, but not limited to bleeding, hemoptysis, respiratory failure requiring intubation, infection, pneumothorax requiring chest tube placement, stroke from air embolism or even death. All of the patient's questions were answered, patient is agreeable to proceed. Consent signed and in chart.  Thank you for this interesting consult.  I greatly enjoyed meeting GURPREET MIKHAIL and look forward to participating in their care.  A copy of this report was sent to the requesting provider on this date.  Electronically Signed: Monia Sabal A 09/03/2015, 7:31 AM   I spent a total of  30 Minutes   in face to face in clinical consultation, greater than 50% of which was counseling/coordinating care for Left lung mass bx

## 2015-09-07 ENCOUNTER — Telehealth: Payer: Self-pay

## 2015-09-07 NOTE — Telephone Encounter (Signed)
Colletta Maryland from [facility] called stating that the patient was recommended to go to the ED on Friday 09/03/2015 and the patient declined since his catheter is malfunctioning. Colletta Maryland stated that a nurse was sent to see visit the patient on Saturday 09/04/2015 and was not able to drain his catheter. Today Colletta Maryland tried to drain his catheter and was not able to. She then contacted his Pulmonologist and was told that patient needed to be seen by Dr. Genevive Bi since it's a catheter malfunction. I went ahead and told Colletta Maryland that I agreed on seeing the patient. She then stated that she would tell his son to call us and schedule his appointment. Patient's son called and we will be seeing him on Friday 09/10/2015.

## 2015-09-07 NOTE — Telephone Encounter (Signed)
Error

## 2015-09-08 ENCOUNTER — Telehealth: Payer: Self-pay | Admitting: Cardiothoracic Surgery

## 2015-09-08 NOTE — Telephone Encounter (Signed)
Appointment has been made for patient with Pulmonology for 09/14/15 @ 10:45am with Dr Stevenson Clinch at the [facility] in the [place]. Patient will need to be advised at his appointment  With Dr Genevive Bi on 09/10/15 of this appointment after patient has been advised of Bx results.

## 2015-09-10 ENCOUNTER — Ambulatory Visit (INDEPENDENT_AMBULATORY_CARE_PROVIDER_SITE_OTHER): Payer: PPO | Admitting: Cardiothoracic Surgery

## 2015-09-10 ENCOUNTER — Encounter: Payer: Self-pay | Admitting: Cardiothoracic Surgery

## 2015-09-10 VITALS — BP 116/80 | HR 54 | Temp 98.7°F | Wt 174.0 lb

## 2015-09-10 DIAGNOSIS — R918 Other nonspecific abnormal finding of lung field: Secondary | ICD-10-CM

## 2015-09-10 NOTE — Progress Notes (Signed)
[name] Inpatient Post-Op Note  Patient ID: [name], male   DOB: 1945/07/14, 70 y.o.   MRN: [id number]  HISTORY: This gentleman underwent a left upper lobe biopsy last week. He's done well since that time. He denies any fevers or chills. Denies any shortness of breath. He tolerated the biopsy very well without any complicating features.   Vitals:   09/10/15 0908  BP: 116/80  Pulse: (!) 54  Temp: 98.7 F (37.1 C)     EXAM:    Resp: Lungs are clear bilaterally.  No respiratory distress, normal effort. Heart:  Regular without murmurs Abd:  Abdomen is soft, non distended and non tender. No masses are palpable.  There is no rebound and no guarding.  Neurological: Alert and oriented to person, place, and time. Coordination normal.  Skin: Skin is warm and dry. No rash noted. No diaphoretic. No erythema. No pallor.  Psychiatric: Normal mood and affect. Normal behavior. Judgment and thought content normal.    ASSESSMENT: I have independently reviewed the CT scan from the biopsy. The lesion was successfully biopsied and the diagnosis was consistent with a abscess. There is no evidence of tumor.   PLAN:   I have discussed with the patient the results of the biopsy. We also obtain the results of the previous thoracotomy specimen and this also revealed an infectious etiology without evidence of tumor in that location. I explained to the patient and his family that he will be followed up by Dr. Edwin Dada in our pulmonary medicine office. I did not see any indication for surgical intervention at this time. We did not make a follow-up appointment for him but would be happy to see him should the need arise.    [name], MD

## 2015-09-14 ENCOUNTER — Ambulatory Visit (INDEPENDENT_AMBULATORY_CARE_PROVIDER_SITE_OTHER): Payer: PPO | Admitting: Internal Medicine

## 2015-09-14 ENCOUNTER — Encounter: Payer: Self-pay | Admitting: Internal Medicine

## 2015-09-14 VITALS — BP 110/60 | HR 79 | Ht 71.0 in | Wt 174.0 lb

## 2015-09-14 DIAGNOSIS — Z720 Tobacco use: Secondary | ICD-10-CM | POA: Diagnosis not present

## 2015-09-14 DIAGNOSIS — J852 Abscess of lung without pneumonia: Secondary | ICD-10-CM | POA: Diagnosis not present

## 2015-09-14 DIAGNOSIS — J449 Chronic obstructive pulmonary disease, unspecified: Secondary | ICD-10-CM

## 2015-09-14 DIAGNOSIS — F17210 Nicotine dependence, cigarettes, uncomplicated: Secondary | ICD-10-CM

## 2015-09-14 DIAGNOSIS — J984 Other disorders of lung: Secondary | ICD-10-CM | POA: Diagnosis not present

## 2015-09-14 MED ORDER — AMOXICILLIN-POT CLAVULANATE 500-125 MG PO TABS: 1.0000 | ORAL_TABLET | Freq: Two times a day (BID) | ORAL | 0 refills | Status: DC

## 2015-09-14 MED ORDER — NICOTINE 21-14-7 MG/24HR TD KIT: PACK | TRANSDERMAL | 0 refills | Status: DC

## 2015-09-14 NOTE — Progress Notes (Signed)
[facility] Consultation    Date: 09/14/2015  MRN# [id number] [name] 1945/03/02  Referring Physician: Dr. Marta Lamas PMD - Dr. Werner Lean [name] is a 70 y.o. old male seen in consultation for LUL lung abscess  CC:  Chief Complaint  Patient presents with  . Advice Only    ref by Genevive Bi: cough prod at times;     HPI:  Patient is a pleasant 70 year old male past medical history of CVD, HLD, dyspnea on exertion, emphysema, smoking, COPD, seen in consultation for left upper lobe lesion, status post biopsy consistent with lung abscess. Review of chart shows the patient had his annual physical with his PMD in early July, giving his smoking habits and history of COPD, a low dose CT scan was done that showed a possible left upper lobe lesion. This was followed by PET/CT, and a subsequent CT-guided biopsy of the lesion on August 20 50,017. After CT-guided biopsy follow-up with surgery, who stated that the results of the surgical biopsy showed findings consistent with a lung abscess and no oncologic process. Patient was sent to pulmonary for further management. Had a previous lung abscess in 2005, LUL, had a partially LUL resection Smoke 1ppd x 55 years, cut back to 0.5ppd 1 weeks ago. Willing to try nicotine patches Has a morning cough, productive white, smokers cough No significant sob, doe Had pfts done already    PMHX:   Past Medical History:  Diagnosis Date  . Anxiety   . Bipolar disorder with severe mania ([place])   . BPH (benign prostatic hyperplasia) 06/08/2009  . Cerebrovascular disease   . Dyslipidemia   . Dyspnea on exertion   . Emphysema   . Episodic confusion    Possible bipolar disorder  . Fatigue   . GERD (gastroesophageal reflux disease)   . History of anxiety   . History of chicken pox   . Memory deficit 07/31/2012  . OCD (obsessive compulsive disorder)   . Patent foramen ovale   . Peripheral neuropathy ([place])   . Pure  hypercholesterolemia 01/10/2003  . Restless leg syndrome 01/10/2003  . RLS (restless legs syndrome)    Surgical Hx:  Past Surgical History:  Procedure Laterality Date  . liver excision     for non-cancerous mass  . LUNG SURGERY  2005   Lipoma Removal   Family Hx:  Family History  Problem Relation Age of Onset  . Heart disease Mother   . Cancer Father   . Heart disease Father   . Diabetes Sister   . Heart disease Sister   . Seizures Paternal Grandfather    Social Hx:   Social History  Substance Use Topics  . Smoking status: Current Every Day Smoker    Packs/day: 0.25    Years: 50.00    Types: Cigarettes  . Smokeless tobacco: Never Used  . Alcohol use Yes     Comment: Occasional    Meds:  Current Outpatient Prescriptions:  .  aspirin 325 MG tablet, Take 325 mg by mouth daily., Disp: , Rfl:  .  Coenzyme Q10 (CO Q-10) 300 MG CAPS, Take 300 mg by mouth daily., Disp: , Rfl:  .  divalproex (DEPAKOTE ER) 500 MG 24 hr tablet, Take 2 tablets (1,000 mg total) by mouth every evening., Disp: 180 tablet, Rfl: 0 .  simvastatin (ZOCOR) 40 MG tablet, TAKE 1 TABLET BY MOUTH EVERY DAY, Disp: 90 tablet, Rfl: 4 .  ziprasidone (GEODON) 40 MG capsule, Take 40 mg by  mouth at bedtime., Disp: , Rfl:    Allergies:  Ropinirole hcl  Review of Systems  Constitutional: Negative for chills, fever and weight loss.  Eyes: Negative for blurred vision and double vision.  Respiratory: Positive for cough and sputum production. Negative for hemoptysis, shortness of breath and wheezing.   Cardiovascular: Negative for chest pain, palpitations and orthopnea.  Gastrointestinal: Negative for heartburn, nausea and vomiting.  Genitourinary: Negative for dysuria.  Musculoskeletal: Negative for myalgias.  Skin: Negative for rash.  Neurological: Negative for dizziness and headaches.  Endo/Heme/Allergies: Does not bruise/bleed easily.  Psychiatric/Behavioral: Negative for depression.     Physical  Examination:   VS: BP 110/60 (BP Location: Left Arm, Cuff Size: Normal)   Pulse 79   Ht '5\' 11"'$  (1.803 m)   Wt 174 lb (78.9 kg)   SpO2 98%   BMI 24.27 kg/m   General Appearance: No distress  Neuro:without focal findings, mental status, speech normal, alert and oriented, cranial nerves 2-12 intact, reflexes normal and symmetric, sensation grossly normal  HEENT: PERRLA, EOM intact, no ptosis, no other lesions noticed; Mallampati 2 Pulmonary: normal breath sounds., diaphragmatic excursion normal.No wheezing, No rales;   Sputum Production:   CardiovascularNormal S1,S2.  No m/r/g.  Abdominal aorta pulsation normal.    Abdomen: Benign, Soft, non-tender, No masses, hepatosplenomegaly, No lymphadenopathy Renal:  No costovertebral tenderness  GU:  No performed at this time. Endoc: No evident thyromegaly, no signs of acromegaly or Cushing features Skin:   warm, no rashes, no ecchymosis  Extremities: normal, no cyanosis, clubbing, no edema, warm with normal capillary refill. Other findings:   Labs results:   Rad results: (The following images and results were reviewed by Dr. Stevenson Clinch on 09/14/2015). PET CT 08/18/15 EXAM: NUCLEAR MEDICINE PET SKULL BASE TO THIGH  TECHNIQUE: 12.6 mCi F-18 FDG was injected intravenously. Full-ring PET imaging was performed from the skull base to thigh after the radiotracer. CT data was obtained and used for attenuation correction and anatomic localization.  FASTING BLOOD GLUCOSE:  Value: 88 mg/dl  COMPARISON:  Chest CT 08/10/2015. Abdominal pelvic stone study of 09/24/2005.  FINDINGS: NECK  No areas of abnormal hypermetabolism.  CHEST  Spiculated left upper lobe pulmonary nodule is hypermetabolic. This measures 1.9 x 2.0 cm and a S.U.V. max of 6.8 on image 93/ series 3. No thoracic nodal hypermetabolism.  ABDOMEN/PELVIS  Left adrenal hypermetabolism is without CT correlate and favored be physiologic. This measures a S.U.V. max of  3.4.  SKELETON  No abnormal marrow activity.  CT IMAGES PERFORMED FOR ATTENUATION CORRECTION  Marked left carotid atherosclerosis with tortuosity and possible ectasia. Example image 39/series 3. Moderate right carotid atherosclerosis. No cervical adenopathy.  Chest findings deferred to recent diagnostic CT. Multivessel coronary artery atherosclerosis. Moderate hiatal hernia. Centrilobular emphysema. Abdominal aortic and branch vessel atherosclerosis. No adrenal mass. Mild prostatomegaly.  IMPRESSION: 1. Hypermetabolic spiculated left upper lobe lung nodule, most consistent with primary bronchogenic carcinoma. No thoracic nodal or extrathoracic hypermetabolic metastasis. Presuming non-small-cell histology, this is most consistent with T1bN0M0 or stage IA. 2. Coronary artery atherosclerosis. Aortic atherosclerosis. Significant left carotid atherosclerosis with tortuosity and possible ectasia. 3. Moderate hiatal hernia.    Other:  CT Guided lung Biopsy on 09/03/15 - Prelim> lung abscess.    Assessment and Plan:70 yo smoker with LUL lung abscess, COPD, seen in consultation for lung abscess Cigarette smoker Tobacco Cessation - Counseling regarding benefits of smoking cessation strategies was provided for more than 3 min. - Educated that at this time smoking-  cessation represents the single most important step that patient can take to enhance the length and quality of live. - Educated patient regarding alternatives of behavior interventions, pharmacotherapy including NRT and non-nicotine therapy such, and combinations of both. - Patient at this time will try nicotine - Nicotine Patch Kit Rx given: 27mg x 2 weeks, 167m x 2 weeks, 7 mcg x 4 weeks.  Please alternate site of patch daily, ie. Right shoulder today, left should tomorrow, etc.    Abscess of upper lobe of [place] lung without pneumonia [place] biopsy proven This is his second lung abscess in his  lifetime. No recent infections or pneumonia.   CT guided biopsy performed, however, no micro was sent on this specimen. Will treat with Augmentin 500/125 1 tab BID x 2 weeks  Plan: - smoking cessation - Augment BID x 2 wks - repeat CT chest w/o contrast in 3 months.   COPD (chronic obstructive pulmonary disease) Clinically with COPD. [facility] - 57m24mprior to follow up visit - tobacco cessation  Pulmonary lesion, left 1.5cm pulmonary lesion in the LUL. Patient is still at risk for lung cancer, especially given the smoking history.  Treat lung abscess  Plan - augmentin for lung abscess - tobacco cessation - repeat CT Chest w/o contrast in 3 months   Updated Medication List Outpatient Encounter Prescriptions as of 09/14/2015  Medication Sig  . aspirin 325 MG tablet Take 325 mg by mouth daily.  . Coenzyme Q10 (CO Q-10) 300 MG CAPS Take 300 mg by mouth daily.  . divalproex (DEPAKOTE ER) 500 MG 24 hr tablet Take 2 tablets (1,000 mg total) by mouth every evening.  . simvastatin (ZOCOR) 40 MG tablet TAKE 1 TABLET BY MOUTH EVERY DAY  . ziprasidone (GEODON) 40 MG capsule Take 40 mg by mouth at bedtime.   No facility-administered encounter medications on file as of 09/14/2015.     Orders for this visit: No orders of the defined types were placed in this encounter.    Thank  you for the consultation and for allowing [facility] to assist in the care of your patient. Our recommendations are noted above.  Please contact us [place] we can be of further service.   VisVilinda BoehringerD  Pulmonary and Critical Care Office Number: [phone]: This note was prepared with Dragon dictation along with smaller phrase technology. Any transcriptional errors that result from this process are unintentional.

## 2015-09-14 NOTE — Assessment & Plan Note (Signed)
Clinically with COPD. Obtain PFT results from [facility] - follow up PFTs - 56mt prior to follow up visit - tobacco cessation

## 2015-09-14 NOTE — Assessment & Plan Note (Signed)
Tobacco Cessation - Counseling regarding benefits of smoking cessation strategies was provided for more than 3 min. - Educated that at this time smoking- cessation represents the single most important step that patient can take to enhance the length and quality of live. - Educated patient regarding alternatives of behavior interventions, pharmacotherapy including NRT and non-nicotine therapy such, and combinations of both. - Patient at this time will try nicotine - Nicotine Patch Kit Rx given: 108mg x 2 weeks, 157m x 2 weeks, 7 mcg x 4 weeks.  Please alternate site of patch daily, ie. Right shoulder today, left should tomorrow, etc.

## 2015-09-14 NOTE — Assessment & Plan Note (Addendum)
1.5cm pulmonary lesion in the LUL. Patient is still at risk for lung cancer, especially given the smoking history.  Treat lung abscess  Plan - augmentin for lung abscess - tobacco cessation - repeat CT Chest w/o contrast in 3 months

## 2015-09-14 NOTE — Assessment & Plan Note (Addendum)
LUL lung abscess - biopsy proven This is his second lung abscess in his lifetime. No recent infections or pneumonia.   CT guided biopsy performed, however, no micro was sent on this specimen. Will treat with Augmentin 500/125 1 tab BID x 2 weeks  Plan: - smoking cessation - Augment BID x 2 wks - repeat CT chest w/o contrast in 3 months.

## 2015-09-14 NOTE — Patient Instructions (Addendum)
Follow up with Dr. Stevenson Clinch in:4 weeks - we will get your PFTs from the Hospital  - 6MWT prior to follow up visit.  - cut back and eventually stop smoking - CXR 2 view prior to follow up visit.  - CT chest w/o contrast in 67month - augmentin 500/125 -1 tab PO BID x 2 weeks - Nicotine Patch Kit Rx : 279m x 2 weeks, 1438mx 2 weeks, 7 mcg x 4 weeks.  Please alternate site of patch daily, ie. Right shoulder today, left should tomorrow, etc.

## 2015-09-16 ENCOUNTER — Ambulatory Visit (INDEPENDENT_AMBULATORY_CARE_PROVIDER_SITE_OTHER): Payer: PPO | Admitting: *Deleted

## 2015-09-16 DIAGNOSIS — J449 Chronic obstructive pulmonary disease, unspecified: Secondary | ICD-10-CM | POA: Diagnosis not present

## 2015-09-16 NOTE — Progress Notes (Signed)
SMW performed today. 

## 2015-10-11 ENCOUNTER — Encounter: Payer: Self-pay | Admitting: Internal Medicine

## 2015-10-11 ENCOUNTER — Ambulatory Visit
Admission: RE | Admit: 2015-10-11 | Discharge: 2015-10-11 | Disposition: A | Discharge status: Home / Self Care | Payer: PPO | Source: Ambulatory Visit | Attending: Internal Medicine | Admitting: Internal Medicine

## 2015-10-11 ENCOUNTER — Ambulatory Visit (INDEPENDENT_AMBULATORY_CARE_PROVIDER_SITE_OTHER): Payer: PPO | Admitting: Internal Medicine

## 2015-10-11 VITALS — BP 130/74 | HR 63 | Ht 70.0 in | Wt 178.0 lb

## 2015-10-11 DIAGNOSIS — J449 Chronic obstructive pulmonary disease, unspecified: Secondary | ICD-10-CM

## 2015-10-11 DIAGNOSIS — F17210 Nicotine dependence, cigarettes, uncomplicated: Secondary | ICD-10-CM

## 2015-10-11 DIAGNOSIS — J852 Abscess of lung without pneumonia: Secondary | ICD-10-CM

## 2015-10-11 DIAGNOSIS — J984 Other disorders of lung: Secondary | ICD-10-CM

## 2015-10-11 DIAGNOSIS — K449 Diaphragmatic hernia without obstruction or gangrene: Secondary | ICD-10-CM | POA: Insufficient documentation

## 2015-10-11 DIAGNOSIS — R911 Solitary pulmonary nodule: Secondary | ICD-10-CM | POA: Diagnosis not present

## 2015-10-11 DIAGNOSIS — R918 Other nonspecific abnormal finding of lung field: Secondary | ICD-10-CM | POA: Diagnosis not present

## 2015-10-11 MED ORDER — FLUTICASONE FUROATE-VILANTEROL 100-25 MCG/INH IN AEPB: 1.0000 | INHALATION_SPRAY | Freq: Every day | RESPIRATORY_TRACT | 0 refills | Status: AC

## 2015-10-11 MED ORDER — FLUTICASONE FUROATE-VILANTEROL 100-25 MCG/INH IN AEPB: 1.0000 | INHALATION_SPRAY | Freq: Every day | RESPIRATORY_TRACT | 0 refills | Status: DC

## 2015-10-11 NOTE — Assessment & Plan Note (Signed)
Tobacco Cessation - Counseling regarding benefits of smoking cessation strategies was provided for more than 3 min. - Educated that at this time smoking- cessation represents the single most important step that patient can take to enhance the length and quality of live. - Educated patient regarding alternatives of behavior interventions, pharmacotherapy including NRT and non-nicotine therapy such, and combinations of both. - Patient at this time will try nicotine, he has cut down his cigarette use down to 4 cigarettes per day  - Nicotine Patch Kit Rx given: 67mg x 2 weeks, 190m x 2 weeks, 7 mcg x 4 weeks.  Please alternate site of patch daily, ie. Right shoulder today, left should tomorrow, etc.

## 2015-10-11 NOTE — Assessment & Plan Note (Signed)
Now with improvement in cough, it's nonproductive at this time. Treated with 2 weeks of Augmentin, started on Breo for mild COPD. Abscess proven by a CT-guided biopsy, however, no micro-available during that biopsy.  Given his smoking history and appearance of left upper lobe spiculated lesion, I believe that he should continue with surveillance for which he has a PET CT scheduled in December 2017   Plan: -Chest x-ray with no acute process or masses -Patient does have a PET CT scheduled in December 2017 which he will keep

## 2015-10-11 NOTE — Progress Notes (Signed)
[facility] Consultation      MRN# [id number] [name] 19-Sep-1945   CC: Chief Complaint  Patient presents with  . Follow-up    4wk rov. SMW/CXR results. pt states breathing is doing well. pt c/o non prod cough.       Brief History: 70 yo with Tobacco hx noted to have LUL spiculated lesion referred from CTS after CT biopsy, CT biopsy consistent with abscess, but no micro, treated with augmentin x 2 weeks. PFTs with mild obstruction, on Breo 100/25, has PET Scan in Dec 2017   Events since last clinic visit: Patient presents today for follow-up visit of this left upper lobes. Lesion, CT guided biopsy of this lesion consistent with lung abscess. Since his last visit he's been treated with 2 weeks of Augmentin. Today he states he has a mild nonproductive cough. Review of his pulmonary function test done at [facility] showed that he has a mild obstruction, FEV1/FVC 68%, FEV1 81%, DLCO 90%. His 6 minute walk test today showed a total distance of 1014 feet, no desaturations, highest heart rate 74. Today he also stated that he is down to 4 cigarettes per day and currently using his nicotine patches.     Current Outpatient Prescriptions:  .  aspirin 325 MG tablet, Take 325 mg by mouth daily., Disp: , Rfl:  .  Coenzyme Q10 (CO Q-10) 300 MG CAPS, Take 300 mg by mouth daily., Disp: , Rfl:  .  divalproex (DEPAKOTE ER) 500 MG 24 hr tablet, Take 2 tablets (1,000 mg total) by mouth every evening., Disp: 180 tablet, Rfl: 0 .  Nicotine 21-14-7 MG/24HR KIT, 21 mcg patch x 2 weeks, 14 mcg patch x2 weeks and 7 mcg patch x 4 weeks, Disp: 56 each, Rfl: 0 .  simvastatin (ZOCOR) 40 MG tablet, TAKE 1 TABLET BY MOUTH EVERY DAY, Disp: 90 tablet, Rfl: 4 .  ziprasidone (GEODON) 40 MG capsule, Take 40 mg by mouth at bedtime., Disp: , Rfl:    Review of Systems  Constitutional: Negative for chills and fever.  HENT: Negative for hearing loss.   Eyes: Negative for blurred vision and double  vision.  Respiratory: Positive for cough. Negative for hemoptysis and sputum production.   Cardiovascular: Negative for chest pain and palpitations.  Gastrointestinal: Negative for heartburn and nausea.  Neurological: Negative for headaches.  Endo/Heme/Allergies: Does not bruise/bleed easily.  Psychiatric/Behavioral: Negative for depression.      Allergies:  Ropinirole hcl  Physical Examination:  VS: BP 130/74 (BP Location: Left Arm, Cuff Size: Normal)   Pulse 63   Ht '5\' 10"'$  (1.778 m)   Wt 178 lb (80.7 kg)   SpO2 97%   BMI 25.54 kg/m   General Appearance: No distress  HEENT: PERRLA, no ptosis, no other lesions noticed Pulmonary:normal breath sounds., diaphragmatic excursion normal.No wheezing, No rales   Cardiovascular:  Normal S1,S2.  No m/r/g.     Abdomen:Exam: Benign, Soft, non-tender, No masses  Skin:   warm, no rashes, no ecchymosis  Extremities: normal, no cyanosis, clubbing, warm with normal capillary refill.      Rad results: (The following images and results were reviewed by Dr. Stevenson Clinch on 10/11/2015). CXR 10/11/15 FINDINGS: No evident pneumothorax. There is postoperative change on the left. There is scarring in the left base. No mass is evident by radiography. No edema or consolidation is evident. Heart size and pulmonary vascularity are normal. No adenopathy is evident. There is a hiatal hernia. There are no blastic or lytic  bone lesions.  IMPRESSION: Postoperative change on the left with scarring left base. No mass or adenopathy evident. No edema or consolidation. Hiatal hernia present. Stable cardiac silhouette.      Assessment and Plan:70 yo with left upper lobe lung lesion, speculated, CT-guided biopsy consistent with abscess, no micro-available. Treated with Augmentin 2 weeks, noted to have mild obstruction on pulmonary function testing, treating as such with COPD given history of smoking and active smoker. COPD (chronic obstructive pulmonary  disease) Review of his pulmonary function test done at [facility] showed that he has a mild obstruction, FEV1/FVC 68%, FEV1 81%, DLCO 90%. His 6 minute walk test today showed a total distance of 1014 feet, no desaturations, highest heart rate 74.  Today we discussed the initiation of a combination drugs such as Breo, for which patient is willing to give a trial. Breo 100/25, 1 puff daily 2 weeks, if patient has improvement will call us back for a prescription  Cigarette smoker Tobacco Cessation - Counseling regarding benefits of smoking cessation strategies was provided for more than 3 min. - Educated that at this time smoking- cessation represents the single most important step that patient can take to enhance the length and quality of live. - Educated patient regarding alternatives of behavior interventions, pharmacotherapy including NRT and non-nicotine therapy such, and combinations of both. - Patient at this time will try nicotine, he has cut down his cigarette use down to 4 cigarettes per day  - Nicotine Patch Kit Rx given: 39mg x 2 weeks, 146m x 2 weeks, 7 mcg x 4 weeks.  Please alternate site of patch daily, ie. Right shoulder today, left should tomorrow, etc.    [place] lung [place] [place] in cough, it's nonproductive at this time. Treated with 2 weeks of Augmentin, started on Breo for mild COPD. Abscess proven by a CT-guided biopsy, however, no micro-available during that biopsy.  Given his smoking history and appearance of left upper lobe spiculated lesion, I believe that he should continue with surveillance for which he has a PET CT scheduled in December 2017   Plan: -Chest x-ray with no acute process or masses -Patient does have a PET CT scheduled in December 2017 which he will keep   Updated Medication List Outpatient Encounter Prescriptions as of 10/11/2015  Medication Sig  . aspirin 325 MG tablet Take 325 mg by mouth  daily.  . Coenzyme Q10 (CO Q-10) 300 MG CAPS Take 300 mg by mouth daily.  . divalproex (DEPAKOTE ER) 500 MG 24 hr tablet Take 2 tablets (1,000 mg total) by mouth every evening.  . Nicotine 21-14-7 MG/24HR KIT 21 mcg patch x 2 weeks, 14 mcg patch x2 weeks and 7 mcg patch x 4 weeks  . simvastatin (ZOCOR) 40 MG tablet TAKE 1 TABLET BY MOUTH EVERY DAY  . ziprasidone (GEODON) 40 MG capsule Take 40 mg by mouth at bedtime.  . [DISCONTINUED] amoxicillin-clavulanate (AUGMENTIN) 500-125 MG tablet Take 1 tablet (500 mg total) by mouth 2 (two) times daily.   No facility-administered encounter medications on file as of 10/11/2015.     Orders for this visit: No orders of the defined types were placed in this encounter.   Thank  you for the visitation and for allowing  [facility] to assist in the care of your patient. Our recommendations are noted above.  Please contact usKoreaf we can be of further service.  ViVilinda BoehringerMD [facility] Office Number: [phone]  Note: This note was prepared with Dragon dictation along with smaller phrase technology. Any transcriptional errors that result from this process are unintentional.

## 2015-10-11 NOTE — Patient Instructions (Signed)
Follow up in 3 months after PET scan - you have mild COPD and will be on an inhaler called BreoEllipta - Breo (100/25) 1 month trial - 1 puff daily. -gargle and rinse after each use. Call us back for a prescription if you see improvement with this inhaler.

## 2015-10-11 NOTE — Progress Notes (Signed)
Patient ID: MAT STUARD, male   DOB: 1946-01-06, 70 y.o.   MRN: [id number] Patient seen in the office today and instructed on use of Breo Ellipta.  Patient expressed understanding and demonstrated technique.

## 2015-10-11 NOTE — Assessment & Plan Note (Signed)
Review of his pulmonary function test done at [facility] showed that he has a mild obstruction, FEV1/FVC 68%, FEV1 81%, DLCO 90%. His 6 minute walk test today showed a total distance of 1014 feet, no desaturations, highest heart rate 74.  Today we discussed the initiation of a combination drugs such as Breo, for which patient is willing to give a trial. Breo 100/25, 1 puff daily 2 weeks, if patient has improvement will call us back for a prescription

## 2015-10-15 DIAGNOSIS — F310 Bipolar disorder, current episode hypomanic: Secondary | ICD-10-CM | POA: Diagnosis not present

## 2015-12-15 ENCOUNTER — Ambulatory Visit
Admission: RE | Admit: 2015-12-15 | Discharge: 2015-12-15 | Disposition: A | Discharge status: Home / Self Care | Payer: PPO | Source: Ambulatory Visit | Attending: Internal Medicine | Admitting: Internal Medicine

## 2015-12-15 DIAGNOSIS — J852 Abscess of lung without pneumonia: Secondary | ICD-10-CM | POA: Diagnosis not present

## 2015-12-15 DIAGNOSIS — R911 Solitary pulmonary nodule: Secondary | ICD-10-CM | POA: Diagnosis not present

## 2015-12-15 DIAGNOSIS — K449 Diaphragmatic hernia without obstruction or gangrene: Secondary | ICD-10-CM | POA: Insufficient documentation

## 2015-12-15 DIAGNOSIS — J984 Other disorders of lung: Secondary | ICD-10-CM

## 2015-12-15 DIAGNOSIS — J449 Chronic obstructive pulmonary disease, unspecified: Secondary | ICD-10-CM | POA: Diagnosis not present

## 2015-12-31 ENCOUNTER — Other Ambulatory Visit: Payer: Self-pay

## 2015-12-31 DIAGNOSIS — J984 Other disorders of lung: Secondary | ICD-10-CM

## 2016-01-11 ENCOUNTER — Telehealth: Payer: Self-pay | Admitting: Internal Medicine

## 2016-01-11 NOTE — Telephone Encounter (Signed)
Pt sister calling asking if we can call her back for patient doesn't really understand appointments and doctor appointments She would like a call back on the CT he did 12/15/15 and also a reason why patient is doing the PET scan Please advise

## 2016-01-11 NOTE — Telephone Encounter (Signed)
Spoke with pt's sister, who states pt is scheduled for PET on 01/13/16. Pt's sister would like CT results and would like to know why this PET scan was ordered. Pt's sister states these two test were performed previously, and only showed an infection. Pt's sister is curious if this nodule will keep coming back. Pt is scheduled to f/u with DR on 01/31/16. DR please advise. Thanks.

## 2016-01-12 NOTE — Telephone Encounter (Signed)
I ATC pt's sister [phone], I was unable to hear her.  I will hold in triage and try back

## 2016-01-12 NOTE — Telephone Encounter (Signed)
Ct scan showed slight increase in size of the nodule, therefore PET scan was recommended.

## 2016-01-13 ENCOUNTER — Encounter
Admission: RE | Admit: 2016-01-13 | Discharge: 2016-01-13 | Disposition: A | Discharge status: Home / Self Care | Payer: PPO | Source: Ambulatory Visit | Attending: Internal Medicine | Admitting: Internal Medicine

## 2016-01-13 DIAGNOSIS — R911 Solitary pulmonary nodule: Secondary | ICD-10-CM | POA: Insufficient documentation

## 2016-01-13 DIAGNOSIS — R918 Other nonspecific abnormal finding of lung field: Secondary | ICD-10-CM | POA: Diagnosis not present

## 2016-01-13 DIAGNOSIS — J984 Other disorders of lung: Secondary | ICD-10-CM

## 2016-01-13 LAB — GLUCOSE, CAPILLARY: Glucose-Capillary: 85 mg/dL (ref 65–99)

## 2016-01-13 MED ORDER — FLUDEOXYGLUCOSE F - 18 (FDG) INJECTION
12.6000 | Freq: Once | INTRAVENOUS | Status: AC | PRN
  Administered 2016-01-13: 12.6 via INTRAVENOUS

## 2016-01-18 NOTE — Telephone Encounter (Signed)
Received VM from sister and tried to call back but had to LM.

## 2016-01-18 NOTE — Telephone Encounter (Signed)
Sister states they have been through this before and pt has had a bx before and they were told it was just an infection. She is asking is the process going to be the same thing again since they have been through this before. She is asking if pt can be placed on a medication to help control the infection. States pt has done CTs multiple times and has had a PET and was treated and now this is happening again. Please advise on next steps.

## 2016-01-18 NOTE — Telephone Encounter (Signed)
Will assess further when I see him at scheduled visit.

## 2016-01-18 NOTE — Telephone Encounter (Signed)
LMOM for pt's sister to return call.

## 2016-01-19 DIAGNOSIS — F3172 Bipolar disorder, in full remission, most recent episode hypomanic: Secondary | ICD-10-CM | POA: Diagnosis not present

## 2016-01-19 NOTE — Telephone Encounter (Signed)
Informed sister of response. Nothing further needed.

## 2016-01-20 DIAGNOSIS — F310 Bipolar disorder, current episode hypomanic: Secondary | ICD-10-CM | POA: Diagnosis not present

## 2016-01-20 DIAGNOSIS — Z79899 Other long term (current) drug therapy: Secondary | ICD-10-CM | POA: Diagnosis not present

## 2016-01-30 NOTE — Progress Notes (Signed)
* [facility]     Assessment and Plan:  Lung Abscess/Lung Nodule.  -Status post CT guided biopsy on 09/03/15, per report, there is purulent material, results were consistent with abscess. No micro-is available. -Given above. Would treat with extended course of antibiotics. -This can likely be followed by chest radiography rather than continued CT or PET scans. If the patient's symptoms deteriorate could consider repeat CT scanning.  Hiatal Hernia/Intrathoracic stomach.  -This may be contributory to the patient's lung abscess. -Would therefore ensure the patient has anti-reflex measures such as PPI and dietary control, such as not eating 4 hours before bedtime.  COPD --Minimally symptomatic at this time, his main treatment would be smoking cessation.   Nicotine abuse. -Discussed importance of smoking cessation in regards to his overall health as well as improvement in his lung abscess. Greater than 3 minutes spent in discussion.  Date: 01/30/2016  MRN# [id number] [name] 1945/08/13   [name] is a 71 y.o. old male seen in follow up for chief complaint of  Chief Complaint  Patient presents with  . Advice Only    prev VM pt:      HPI:  71 yo with Tobacco hx noted to have LUL spiculated lesion referred from CTS after CT biopsy, CT biopsy was performed on 09/03/15, this was consistent with abscess, but no micro, was available. He was treated with augmentin x 2 weeks. PFTs with mild obstruction, on Breo 100/25. He was sent for repeat CT scan the following month and then sent for  PET Scan by Dr. Stevenson Clinch.   Review of PET scan images from 01/13/16; There is a minimally changed LUL lesion with calcification. Per radiology report this is minimally changed from previous scans on 12/15/2015 and 08/18/2015.  He is smoking about a quarter pack per day, he is currently in no inhalers. He notes that his breathing has been "pretty good" he only notes trouble with  heavy exertion.   Review of his pulmonary function test done at [facility] showed that he has a mild obstruction, FEV1/FVC 68%, FEV1 81%, DLCO 90%. His 6 minute walk test showed a total distance of 1014 feet, no desaturations, highest heart rate 74.    Medication:   Outpatient Encounter Prescriptions as of 01/31/2016  Medication Sig  . aspirin 325 MG tablet Take 325 mg by mouth daily.  . Coenzyme Q10 (CO Q-10) 300 MG CAPS Take 300 mg by mouth daily.  . divalproex (DEPAKOTE ER) 500 MG 24 hr tablet Take 2 tablets (1,000 mg total) by mouth every evening.  . Nicotine 21-14-7 MG/24HR KIT 21 mcg patch x 2 weeks, 14 mcg patch x2 weeks and 7 mcg patch x 4 weeks  . simvastatin (ZOCOR) 40 MG tablet TAKE 1 TABLET BY MOUTH EVERY DAY  . ziprasidone (GEODON) 40 MG capsule Take 40 mg by mouth at bedtime.   No facility-administered encounter medications on file as of 01/31/2016.      Allergies:  Ropinirole hcl  Review of Systems: Gen:  Denies  fever, sweats. HEENT: Denies blurred vision. Cvc:  No dizziness, chest pain or heaviness Resp:   Denies cough or sputum porduction. Gi: Denies swallowing difficulty, stomach pain. constipation, bowel incontinence Gu:  Denies bladder incontinence, burning urine Ext:   No Joint pain, stiffness. Skin: No skin rash, easy bruising. Endoc:  No polyuria, polydipsia. Psych: No depression, insomnia. Other:  All other systems were reviewed and found to be negative other than what is mentioned in  the HPI.   Physical Examination:   VS: BP 122/70 (BP Location: Right Arm, Cuff Size: Normal)   Pulse 66   Wt 178 lb (80.7 kg)   SpO2 99%   BMI 25.54 kg/m   General Appearance: No distress  Neuro:without focal findings,  speech normal,  HEENT: PERRLA, EOM intact. Pulmonary: normal breath sounds, No wheezing.   CardiovascularNormal S1,S2.  No m/r/g.   Abdomen: Benign, Soft, non-tender. Renal:  No costovertebral tenderness  GU:  Not performed at this time. Endoc: No  evident thyromegaly, no signs of acromegaly. Skin:   warm, no rash. Extremities: normal, no cyanosis, clubbing.   LABORATORY PANEL:   CBC No results for input(s): WBC, HGB, HCT, PLT in the last 168 hours. ------------------------------------------------------------------------------------------------------------------  Chemistries  No results for input(s): NA, K, CL, CO2, GLUCOSE, BUN, CREATININE, CALCIUM, MG, AST, ALT, ALKPHOS, BILITOT in the last 168 hours.  Invalid input(s): GFRCGP ------------------------------------------------------------------------------------------------------------------  Cardiac Enzymes No results for input(s): TROPONINI in the last 168 hours. ------------------------------------------------------------  RADIOLOGY:   No results found for this or any previous visit. Results for orders placed during the hospital encounter of 10/11/15  DG Chest 2 View   Narrative CLINICAL DATA:  Recent lung biopsy.  EXAM: CHEST  2 VIEW  COMPARISON:  Chest radiograph September 03, 2015 ; PET-CT August 18, 2015  FINDINGS: No evident pneumothorax. There is postoperative change on the left. There is scarring in the left base. No mass is evident by radiography. No edema or consolidation is evident. Heart size and pulmonary vascularity are normal. No adenopathy is evident. There is a hiatal hernia. There are no blastic or lytic bone lesions.  IMPRESSION: Postoperative change on the left with scarring left base. No mass or adenopathy evident. No edema or consolidation. Hiatal hernia present. Stable cardiac silhouette.   Electronically Signed   By: Lowella Grip III M.D.   On: 10/11/2015 09:12    ------------------------------------------------------------------------------------------------------------------  Thank  you for allowing [facility] to assist in the care of your patient. Our recommendations are noted above.  Please contact  us if we can be of further service.   Marda Stalker, MD.  [facility] Office Number: [phone]  Patricia Pesa, M.D.  Vilinda Boehringer, M.D.  Merton Border, M.D  01/30/2016

## 2016-01-31 ENCOUNTER — Ambulatory Visit (INDEPENDENT_AMBULATORY_CARE_PROVIDER_SITE_OTHER): Payer: PPO | Admitting: Internal Medicine

## 2016-01-31 ENCOUNTER — Encounter: Payer: Self-pay | Admitting: Internal Medicine

## 2016-01-31 ENCOUNTER — Encounter (INDEPENDENT_AMBULATORY_CARE_PROVIDER_SITE_OTHER): Payer: Self-pay

## 2016-01-31 VITALS — BP 122/70 | HR 66 | Wt 178.0 lb

## 2016-01-31 DIAGNOSIS — R911 Solitary pulmonary nodule: Secondary | ICD-10-CM

## 2016-01-31 DIAGNOSIS — K449 Diaphragmatic hernia without obstruction or gangrene: Secondary | ICD-10-CM

## 2016-01-31 DIAGNOSIS — J449 Chronic obstructive pulmonary disease, unspecified: Secondary | ICD-10-CM

## 2016-01-31 DIAGNOSIS — K219 Gastro-esophageal reflux disease without esophagitis: Secondary | ICD-10-CM

## 2016-01-31 DIAGNOSIS — F17210 Nicotine dependence, cigarettes, uncomplicated: Secondary | ICD-10-CM | POA: Diagnosis not present

## 2016-01-31 DIAGNOSIS — J984 Other disorders of lung: Secondary | ICD-10-CM

## 2016-01-31 DIAGNOSIS — J851 Abscess of lung with pneumonia: Secondary | ICD-10-CM

## 2016-01-31 MED ORDER — OMEPRAZOLE 40 MG PO CPDR
40.0000 mg | DELAYED_RELEASE_CAPSULE | Freq: Every day | ORAL | 5 refills | Status: DC
Start: 2016-01-31 — End: 2016-08-07

## 2016-01-31 MED ORDER — OMEPRAZOLE 20 MG PO CPDR: 20.0000 mg | DELAYED_RELEASE_CAPSULE | Freq: Every day | ORAL | 5 refills | Status: DC

## 2016-01-31 MED ORDER — AMOXICILLIN-POT CLAVULANATE 500-125 MG PO TABS: 1.0000 | ORAL_TABLET | Freq: Two times a day (BID) | ORAL | 0 refills | Status: DC

## 2016-01-31 NOTE — Patient Instructions (Addendum)
-  Will treat with Augmentin 500 mg twice daily for 6 weeks. Your infection may related to a large hiatal hernia which leads to reflux at night and can lead to lung infections.   --Do not eat for 4 hours before bedtime.   -start omeprazole 40 mg daily.   -repeat chest ray in 3 months.  --Continued smoking will slow the healing of your lung infection.   --Quitting smoking is the most important thing that you can do for your health.  --Quitting smoking will have greater affect on your health than any medicine that we can give you.

## 2016-03-20 ENCOUNTER — Other Ambulatory Visit: Payer: Self-pay | Admitting: Internal Medicine

## 2016-04-20 DIAGNOSIS — F3172 Bipolar disorder, in full remission, most recent episode hypomanic: Secondary | ICD-10-CM | POA: Diagnosis not present

## 2016-06-30 ENCOUNTER — Other Ambulatory Visit: Payer: Self-pay | Admitting: Family Medicine

## 2016-07-19 ENCOUNTER — Encounter: Payer: Self-pay | Admitting: Family Medicine

## 2016-07-19 ENCOUNTER — Ambulatory Visit (INDEPENDENT_AMBULATORY_CARE_PROVIDER_SITE_OTHER): Payer: PPO | Admitting: Family Medicine

## 2016-07-19 ENCOUNTER — Ambulatory Visit
Admission: RE | Admit: 2016-07-19 | Discharge: 2016-07-19 | Disposition: A | Discharge status: Home / Self Care | Payer: PPO | Source: Ambulatory Visit | Attending: Family Medicine | Admitting: Family Medicine

## 2016-07-19 VITALS — BP 100/60 | HR 67 | Temp 97.7°F | Resp 18 | Ht 70.0 in | Wt 177.0 lb

## 2016-07-19 DIAGNOSIS — Q2112 Patent foramen ovale: Secondary | ICD-10-CM

## 2016-07-19 DIAGNOSIS — Z125 Encounter for screening for malignant neoplasm of prostate: Secondary | ICD-10-CM | POA: Diagnosis not present

## 2016-07-19 DIAGNOSIS — I679 Cerebrovascular disease, unspecified: Secondary | ICD-10-CM

## 2016-07-19 DIAGNOSIS — Q211 Atrial septal defect: Secondary | ICD-10-CM | POA: Diagnosis not present

## 2016-07-19 DIAGNOSIS — J449 Chronic obstructive pulmonary disease, unspecified: Secondary | ICD-10-CM | POA: Diagnosis not present

## 2016-07-19 DIAGNOSIS — Z6825 Body mass index (BMI) 25.0-25.9, adult: Secondary | ICD-10-CM

## 2016-07-19 DIAGNOSIS — E7800 Pure hypercholesterolemia, unspecified: Secondary | ICD-10-CM

## 2016-07-19 DIAGNOSIS — E039 Hypothyroidism, unspecified: Secondary | ICD-10-CM | POA: Diagnosis not present

## 2016-07-19 DIAGNOSIS — J852 Abscess of lung without pneumonia: Secondary | ICD-10-CM

## 2016-07-19 DIAGNOSIS — F17210 Nicotine dependence, cigarettes, uncomplicated: Secondary | ICD-10-CM

## 2016-07-19 DIAGNOSIS — J984 Other disorders of lung: Secondary | ICD-10-CM | POA: Insufficient documentation

## 2016-07-19 DIAGNOSIS — E038 Other specified hypothyroidism: Secondary | ICD-10-CM

## 2016-07-19 DIAGNOSIS — R918 Other nonspecific abnormal finding of lung field: Secondary | ICD-10-CM | POA: Diagnosis not present

## 2016-07-19 DIAGNOSIS — Z0000 Encounter for general adult medical examination without abnormal findings: Secondary | ICD-10-CM

## 2016-07-19 NOTE — Patient Instructions (Signed)
   Go to the [facility] on [address] for chest Xray   Please contact your eyecare professional to schedule a routine eye exam

## 2016-07-19 NOTE — Progress Notes (Signed)
Patient: [name], Male    DOB: 08-09-1945, 71 y.o.   MRN: [id number] Visit Date: 07/19/2016  Today's Provider: Lelon Huh, MD   Chief Complaint  Patient presents with  . Annual Exam  . COPD    follow up  . Hypothyroidism    follow up  . Hyperlipidemia    follow up  . Manic Behavior    follow up   Subjective:    Annual physical [name] is a 71 y.o. male. He feels fairly well. He reports exercising 2-3 times a week. He reports he is sleeping fairly well.  ----------------------------------------------------------- Follow up of COPD:  Patient was last seen for this problem 1 year ago and no changes were made. Patient reports this condition is stable.   Follow up of Hypothyroidism:  Patient was last seen for this problem 1 year ago and no changes were made.    Lab Results  Component Value Date   TSH 3.920 07/19/2015     Follow up of Bipolar Disorder:  Patient was last seen for this problem 1 year ago and no changes were made. This problem is being managed by Nicolasa Ducking. Patient reports this condition is stable. He has an appointment follow up with Dr. Nicolasa Ducking in 1 week.    Lipid/Cholesterol, Follow-up:   Last seen for this1 years ago.  Management changes since that visit include none. . Last Lipid Panel:    Component Value Date/Time   CHOL 146 07/19/2015 1019   TRIG 86 07/19/2015 1019   HDL 66 07/19/2015 1019   CHOLHDL 2.2 07/19/2015 1019   CHOLHDL 3.4 06/24/2008 0457   VLDL 9 06/24/2008 0457   LDLCALC 63 07/19/2015 1019    Risk factors for vascular disease include hypercholesterolemia  He reports good compliance with treatment. He is not having side effects.  Current symptoms include none and have been stable. Weight trend: stable Prior visit with dietician: no Current diet: well balanced Current exercise: yard work  [employer] from Last 3 Encounters:  01/31/16 178 lb (80.7 kg)  10/11/15 178 lb (80.7 kg)  09/14/15 174 lb (78.9  kg)    -------------------------------------------------------------------  He was found to have pulmonary nodule on LDCT screening last year which was biopsied and thought to be abscess. He was treated by pulmonary with antibiotics and Dr. Ashby Dawes to have follow up chest Xray in March, which patient has not yet done.   Review of Systems  Constitutional: Negative for appetite change, chills, fatigue and fever.  HENT: Negative for congestion, ear pain, hearing loss, nosebleeds and trouble swallowing.   Eyes: Negative for pain and visual disturbance.  Respiratory: Positive for shortness of breath. Negative for cough and chest tightness.   Cardiovascular: Negative for chest pain, palpitations and leg swelling.  Gastrointestinal: Negative for abdominal pain, blood in stool, constipation, diarrhea, nausea and vomiting.  Endocrine: Negative for polydipsia, polyphagia and polyuria.  Genitourinary: Negative for dysuria and flank pain.  Musculoskeletal: Negative for arthralgias, back pain, joint swelling, myalgias and neck stiffness.  Skin: Negative for color change, rash and wound.  Neurological: Negative for dizziness, tremors, seizures, speech difficulty, weakness, light-headedness and headaches.  Psychiatric/Behavioral: Negative for behavioral problems, confusion, decreased concentration, dysphoric mood and sleep disturbance. The patient is not nervous/anxious.   All other systems reviewed and are negative.   Social History   Social History  . Marital status: Single    Spouse name: [name]  . Number of children: 0  .  Years of education: 79   Occupational History  . Retired    Social History Main Topics  . Smoking status: Current Every Day Smoker    Packs/day: 0.25    Years: 50.00    Types: Cigarettes  . Smokeless tobacco: Never Used  . Alcohol use Yes     Comment: Occasional  . Drug use: No  . Sexual activity: Not on file   Other Topics Concern  . Not on file   Social  History Narrative   Patient lives at home with wife.    Patient is retired.    Patient has no children.    Patient has 11th grade education.     Past Medical History:  Diagnosis Date  . Emphysema   . Episodic confusion    Possible bipolar disorder  . GERD (gastroesophageal reflux disease)   . History of chicken pox   . Memory deficit 07/31/2012  . OCD (obsessive compulsive disorder)   . Peripheral neuropathy   . Restless leg syndrome 01/10/2003  . Unspecified psychosis 03/01/2012     Patient Active Problem List   Diagnosis Date Noted  . Patent foramen ovale   . Abscess of upper lobe of left lung without pneumonia ([place]) 09/14/2015  . Smoking greater than 30 pack years 07/19/2015  . Subclinical hypothyroidism 07/17/2014  . Anxiety 07/08/2014  . Dyspnea on exertion 07/08/2014  . Memory deficit 07/31/2012  . Cerebrovascular disease, unspecified 03/01/2012  . Restless legs syndrome (RLS) 03/01/2012  . Unspecified hereditary and idiopathic peripheral neuropathy 03/01/2012  . COPD (chronic obstructive pulmonary disease) ([place]) 06/14/2010  . BPH (benign prostatic hyperplasia) 06/08/2009  . Late effects of cerebrovascular disease 07/19/2008  . Bipolar I disorder, single manic episode, moderate ([place]) 07/14/2008  . Fam hx-ischem heart disease 07/16/2006  . Hypercholesterolemia 01/10/2003  . Herpesviral infection of perianal skin and rectum 01/10/2000    Past Surgical History:  Procedure Laterality Date  . liver excision     for non-cancerous mass  . LUNG SURGERY  2005   Lipoma Removal    His family history includes Cancer in his father; Diabetes in his sister; Heart disease in his father, mother, and sister; Seizures in his paternal grandfather.      Current Outpatient Prescriptions:  .  aspirin 325 MG tablet, Take 325 mg by mouth daily., Disp: , Rfl:  .  Coenzyme Q10 (CO Q-10) 300 MG CAPS, Take 300 mg by mouth daily., Disp: , Rfl:  .  divalproex (DEPAKOTE ER) 500 MG 24 hr  tablet, Take 1,500 mg by mouth at bedtime., Disp: , Rfl:  .  Nicotine 21-14-7 MG/24HR KIT, 21 mcg patch x 2 weeks, 14 mcg patch x2 weeks and 7 mcg patch x 4 weeks, Disp: 56 each, Rfl: 0 .  omeprazole (PRILOSEC) 40 MG capsule, Take 1 capsule (40 mg total) by mouth daily., Disp: 30 capsule, Rfl: 5 .  simvastatin (ZOCOR) 40 MG tablet, TAKE 1 TABLET (40 MG TOTAL) BY MOUTH DAILY., Disp: 90 tablet, Rfl: 3 .  ziprasidone (GEODON) 40 MG capsule, Take 40 mg by mouth at bedtime., Disp: , Rfl:   Patient Care Team: Birdie Sons, MD as PCP - General (Family Medicine) Kathrynn Ducking, MD as Consulting Physician (Neurology) Chauncey Mann, MD as Referring Physician (Psychiatry) Vladimir Crofts, MD (Neurology)     Objective:   Vitals: BP 100/60 (BP Location: Left Arm, Patient Position: Sitting, Cuff Size: Normal)   Pulse 67   Temp 97.7 F (36.5 C) (  Oral)   Resp 18   Ht '5\' 10"'$  (1.778 m)   Wt 177 lb (80.3 kg)   SpO2 98% Comment: room air  BMI 25.40 kg/m   Physical Exam   General Appearance:    Alert, cooperative, no distress, appears stated age  Head:    Normocephalic, without obvious abnormality, atraumatic  Eyes:    PERRL, conjunctiva/corneas clear, EOM's intact, fundi    benign, both eyes       Ears:    Normal TM's and external ear canals, both ears  Nose:   Nares normal, septum midline, mucosa normal, no drainage   or sinus tenderness  Throat:   Lips, mucosa, and tongue normal; teeth and gums normal  Neck:   Supple, symmetrical, trachea midline, no adenopathy;       thyroid:  No enlargement/tenderness/nodules; no carotid   bruit or JVD  Back:     Symmetric, no curvature, ROM normal, no CVA tenderness  Lungs:     Clear to auscultation bilaterally, respirations unlabored  Chest wall:    No tenderness or deformity  Heart:    Regular rate and rhythm, S1 and S2 normal, no murmur, rub   or gallop  Abdomen:     Soft, non-tender, bowel sounds active all four quadrants,    no masses, no  organomegaly  Genitalia:    deferred  Rectal:    deferred  Extremities:   Extremities normal, atraumatic, no cyanosis or edema  Pulses:   2+ and symmetric all extremities  Skin:   Skin color, texture, turgor normal, no rashes or lesions  Lymph nodes:   Cervical, supraclavicular, and axillary nodes normal  Neurologic:   CNII-XII intact. Normal strength, sensation and reflexes      throughout      Activities of Daily Living In your present state of health, do you have any difficulty performing the following activities: 07/19/2016 09/03/2015  Hearing? Y N  Vision? N N  Difficulty concentrating or making decisions? N N  Walking or climbing stairs? N N  Dressing or bathing? N N  Doing errands, shopping? N -  Some recent data might be hidden    Fall Risk Assessment Fall Risk  07/19/2016 07/19/2015 07/16/2014  Falls in the past year? No No No     Depression Screen PHQ 2/9 Scores 07/19/2016 07/19/2015 07/16/2014  PHQ - 2 Score 0 0 1  PHQ- 9 Score 0 - -    Cognitive Testing - 6-CIT  Correct? Score   What year is it? yes 0 0 or 4  What month is it? yes 0 0 or 3  Memorize:    [name],  42,  [address]  [place],      What time is it? (within 1 hour) yes 0 0 or 3  Count backwards from 20 yes 0 0, 2, or 4  Name the months of the year yes 0 0, 2, or 4  Repeat name & address above yes 0 0, 2, 4, 6, 8, or 10       TOTAL SCORE  0/28   Interpretation:  Normal  Normal (0-7) Abnormal (8-28)    Current Exercise Habits: Home exercise routine, Time (Minutes): 60, Frequency (Times/Week): 3, Weekly Exercise (Minutes/Week): 180, Intensity: Mild Exercise limited by: None identified   Audit-C Alcohol Use Screening  Question Answer Points  How often do you have alcoholic drink? never 0  On days you do drink alcohol, how many drinks do you typically consume? n/a  0  How oftey will you drink 6 or more in a total? never 0  Total Score:  0   A score of 3 or more in women, and 4 or more in men  indicates increased risk for alcohol abuse, EXCEPT if all of the points are from question 1.     Assessment & Plan:    Annual Physical Reviewed patient's Family Medical History Reviewed and updated list of patient's medical providers Assessment of cognitive impairment was done Assessed patient's functional ability Established a written schedule for health screening [place] Completed and Reviewed  Exercise Activities and Dietary recommendations Goals    None      Immunization History  Administered Date(s) Administered  . Influenza, High Dose Seasonal PF 10/08/2015  . Pneumococcal Conjugate-13 07/14/2013  . Pneumococcal Polysaccharide-23 07/05/2011  . Tdap 06/04/2008  . Zoster 06/08/2009    Health Maintenance  Topic Date Due  . INFLUENZA VACCINE  08/09/2016  . TETANUS/TDAP  06/05/2018  . COLONOSCOPY  08/08/2020  . Hepatitis C Screening  Completed  . PNA vac Low Risk Adult  Completed     Discussed health benefits of physical activity, and encouraged him to engage in regular exercise appropriate for his age and condition.    ------------------------------------------------------------------------------------------------------------  1. Annual physical exam Generally doing well.   2. BMI 25.0-25.9,adult Patient has mildly elevated BMI, but body habitus is relatively muscular and not consistent with obesity. Encouraged regular exercise and healthy eating habits.   3. Cerebrovascular disease, unspecified Asymptomatic. Compliant with medication.  Continue aggressive risk factor modification.    4. Chronic obstructive pulmonary disease, unspecified COPD type ([place]) Encouraged smoking cessation. Last spirometry in 2016 stable compared to 2014. Minimal symptoms.   5. Hypercholesterolemia He is tolerating simvastatin well with no adverse effects.   - Comprehensive metabolic panel - Lipid panel  6. Subclinical hypothyroidism  - T4 AND TSH  7.  Smoking greater than 30 pack years Advised will be due for lung CT in December.   8. Abscess of upper lobe of left lung without pneumonia ([place]) If chest XR is stable will return to annual CTs - DG Chest 2 View; Future  9. Prostate cancer screening  - PSA  The entirety of the information documented in the History of Present Illness, Review of Systems and Physical Exam were personally obtained by me. Portions of this information were initially documented by Meyer Cory, CMA and reviewed by me for thoroughness and accuracy.    Lelon Huh, MD  [facility]

## 2016-07-20 LAB — COMPREHENSIVE METABOLIC PANEL
ALT: 21 IU/L (ref 0–44)
AST: 30 IU/L (ref 0–40)
Albumin/Globulin Ratio: 1.9 (ref 1.2–2.2)
Albumin: 4.4 g/dL (ref 3.5–4.8)
Alkaline Phosphatase: 67 IU/L (ref 39–117)
BUN/Creatinine Ratio: 15 (ref 10–24)
BUN: 18 mg/dL (ref 8–27)
Bilirubin Total: 0.3 mg/dL (ref 0.0–1.2)
CO2: 25 mmol/L (ref 20–29)
Calcium: 9.7 mg/dL (ref 8.6–10.2)
Chloride: 104 mmol/L (ref 96–106)
Creatinine, Ser: 1.24 mg/dL (ref 0.76–1.27)
GFR calc Af Amer: 68 mL/min/{1.73_m2} (ref >59)
GFR calc non Af Amer: 59 mL/min/{1.73_m2} — ABNORMAL LOW (ref >59)
Globulin, Total: 2.3 g/dL (ref 1.5–4.5)
Glucose: 85 mg/dL (ref 65–99)
Potassium: 5.4 mmol/L — ABNORMAL HIGH (ref 3.5–5.2)
Sodium: 142 mmol/L (ref 134–144)
Total Protein: 6.7 g/dL (ref 6.0–8.5)

## 2016-07-20 LAB — LIPID PANEL
Chol/HDL Ratio: 2.6 ratio (ref 0.0–5.0)
Cholesterol, Total: 161 mg/dL (ref 100–199)
HDL: 62 mg/dL (ref >39)
LDL Calculated: 79 mg/dL (ref 0–99)
Triglycerides: 102 mg/dL (ref 0–149)
VLDL Cholesterol Cal: 20 mg/dL (ref 5–40)

## 2016-07-20 LAB — PSA: Prostate Specific Ag, Serum: 2 ng/mL (ref 0.0–4.0)

## 2016-07-20 LAB — T4 AND TSH
T4, Total: 6.3 ug/dL (ref 4.5–12.0)
TSH: 5.71 u[IU]/mL — ABNORMAL HIGH (ref 0.450–4.500)

## 2016-07-21 ENCOUNTER — Telehealth: Payer: Self-pay | Admitting: Family Medicine

## 2016-07-21 NOTE — Telephone Encounter (Signed)
pt called to get lab results from Wednesday visit  Pt's call back [phone]  Thanks teri

## 2016-07-24 DIAGNOSIS — F3172 Bipolar disorder, in full remission, most recent episode hypomanic: Secondary | ICD-10-CM | POA: Diagnosis not present

## 2016-08-07 ENCOUNTER — Other Ambulatory Visit: Payer: Self-pay | Admitting: Family Medicine

## 2016-08-07 MED ORDER — OMEPRAZOLE 40 MG PO CPDR: 40.0000 mg | DELAYED_RELEASE_CAPSULE | Freq: Every day | ORAL | 5 refills | Status: DC

## 2016-08-07 NOTE — Telephone Encounter (Signed)
Pt needs refill on his    Omeprazole 40mg   [facility]

## 2016-10-27 DIAGNOSIS — H353231 Exudative age-related macular degeneration, bilateral, with active choroidal neovascularization: Secondary | ICD-10-CM | POA: Diagnosis not present

## 2016-10-31 DIAGNOSIS — H359 Unspecified retinal disorder: Secondary | ICD-10-CM | POA: Diagnosis not present

## 2016-11-13 DIAGNOSIS — F3172 Bipolar disorder, in full remission, most recent episode hypomanic: Secondary | ICD-10-CM | POA: Diagnosis not present

## 2016-11-22 ENCOUNTER — Ambulatory Visit: Payer: PPO | Admitting: Internal Medicine

## 2016-11-22 NOTE — Progress Notes (Signed)
* [facility]     Assessment and Plan:   COPD --Minimally symptomatic at this time, his main treatment would be smoking cessation.  -No need for inhalers at this time.  Nicotine abuse. -Discussed importance of smoking cessation. Greater than 3 minutes spent in discussion.  Lung Abscess/Lung Nodule.  -Status post CT guided biopsy on 09/03/15, consistent with infection, treated with antibiotics, no further radiological follow-up deemed necessary  Hiatal Hernia/Intrathoracic stomach.  -This may be contributory to the patient's lung abscess. -Would therefore ensure the patient has anti-reflex measures such as PPI and dietary control,  not eating 4 hours before bedtime.  Date: 11/22/2016  MRN# [id number] [name] 1945/10/12   [name] is a 71 y.o. old male seen in follow up for chief complaint of  Chief Complaint  Patient presents with  . Follow-up    SOB at times: NP cough:      Synopsis: Patient had a left upper lobe spiculated lesion, status post CT biopsy on 09/03/15, consistent with abscess, no micro available.  Treated with Augmentin for 2 weeks.  Subsequent PET scan 01/13/16 minimally changed left upper lobe lesion with calcification, minimally changed from 12/15/15, 08/18/15.[name]  No further CT follow-up was deemed necessary.  COPD with continued smoking.  Subjective: He feels that his breathing has been doing well, he only gets winded with heavy exertion. He is currently on no inhalers. He has an inhaler but he does not use it.  He continues to smoke half to ppd, he has quit in the past for about a year. He has prilosec on his medication list, but does not think that he is taking it.   Imaging personally reviewed, chest x-ray 07/19/16; hyperinflation consistent with emphysema, otherwise lungs are unremarkable.  Review of his pulmonary function test done at [facility] showed that he has a mild obstruction, FEV1/FVC 68%, FEV1 81%, DLCO 90%. His 6 minute  walk test showed a total distance of 1014 feet, no desaturations, highest heart rate 74.    Medication:   Outpatient Encounter Medications as of 11/23/2016  Medication Sig  . aspirin 325 MG tablet Take 325 mg by mouth daily.  . Coenzyme Q10 (CO Q-10) 300 MG CAPS Take 300 mg by mouth daily.  . divalproex (DEPAKOTE ER) 500 MG 24 hr tablet Take 1,500 mg by mouth at bedtime.  . Nicotine 21-14-7 MG/24HR KIT 21 mcg patch x 2 weeks, 14 mcg patch x2 weeks and 7 mcg patch x 4 weeks  . omeprazole (PRILOSEC) 40 MG capsule Take 1 capsule (40 mg total) by mouth daily.  . simvastatin (ZOCOR) 40 MG tablet TAKE 1 TABLET (40 MG TOTAL) BY MOUTH DAILY.  . ziprasidone (GEODON) 40 MG capsule Take 40 mg by mouth at bedtime.   No facility-administered encounter medications on file as of 11/23/2016.      Allergies:  Ropinirole hcl  Review of Systems: Gen:  Denies  fever, sweats. HEENT: Denies blurred vision. Cvc:  No dizziness, chest pain or heaviness Resp:   Denies cough or sputum porduction. Gi: Denies swallowing difficulty, stomach pain. constipation, bowel incontinence Gu:  Denies bladder incontinence, burning urine Ext:   No Joint pain, stiffness. Skin: No skin rash, easy bruising. Endoc:  No polyuria, polydipsia. Psych: No depression, insomnia. Other:  All other systems were reviewed and found to be negative other than what is mentioned in the HPI.   Physical Examination:   VS: There were no vitals taken for this visit.  General  Appearance: No distress  Neuro:without focal findings,  speech normal,  HEENT: PERRLA, EOM intact. Pulmonary: normal breath sounds, No wheezing.   CardiovascularNormal S1,S2.  No m/r/g.   Abdomen: Benign, Soft, non-tender. Renal:  No costovertebral tenderness  GU:  Not performed at this time. Endoc: No evident thyromegaly, no signs of acromegaly. Skin:   warm, no rash. Extremities: normal, no cyanosis, clubbing.   LABORATORY PANEL:   CBC No results for  input(s): WBC, HGB, HCT, PLT in the last 168 hours. ------------------------------------------------------------------------------------------------------------------  Chemistries  No results for input(s): NA, K, CL, CO2, GLUCOSE, BUN, CREATININE, CALCIUM, MG, AST, ALT, ALKPHOS, BILITOT in the last 168 hours.  Invalid input(s): GFRCGP ------------------------------------------------------------------------------------------------------------------  Cardiac Enzymes No results for input(s): TROPONINI in the last 168 hours. ------------------------------------------------------------  RADIOLOGY:   No results found for this or any previous visit. Results for orders placed during the hospital encounter of 10/11/15  DG Chest 2 View   Narrative CLINICAL DATA:  Recent lung biopsy.  EXAM: CHEST  2 VIEW  COMPARISON:  Chest radiograph September 03, 2015 ; PET-CT August 18, 2015  FINDINGS: No evident pneumothorax. There is postoperative change on the left. There is scarring in the left base. No mass is evident by radiography. No edema or consolidation is evident. Heart size and pulmonary vascularity are normal. No adenopathy is evident. There is a hiatal hernia. There are no blastic or lytic bone lesions.  IMPRESSION: Postoperative change on the left with scarring left base. No mass or adenopathy evident. No edema or consolidation. Hiatal hernia present. Stable cardiac silhouette.   Electronically Signed   By: Lowella Grip III M.D.   On: 10/11/2015 09:12    ------------------------------------------------------------------------------------------------------------------  Thank  you for allowing [facility] to assist in the care of your patient. Our recommendations are noted above.  Please contact us if we can be of further service.   Marda Stalker, MD.  [facility] Office Number: [phone]  Patricia Pesa, M.D.  Merton Border, M.D  11/22/2016

## 2016-11-23 ENCOUNTER — Ambulatory Visit: Payer: PPO | Admitting: Internal Medicine

## 2016-11-23 ENCOUNTER — Encounter: Payer: Self-pay | Admitting: Internal Medicine

## 2016-11-23 VITALS — BP 132/80 | HR 69 | Ht 70.0 in | Wt 177.0 lb

## 2016-11-23 DIAGNOSIS — K449 Diaphragmatic hernia without obstruction or gangrene: Secondary | ICD-10-CM

## 2016-11-23 DIAGNOSIS — K219 Gastro-esophageal reflux disease without esophagitis: Secondary | ICD-10-CM

## 2016-11-23 DIAGNOSIS — J449 Chronic obstructive pulmonary disease, unspecified: Secondary | ICD-10-CM | POA: Diagnosis not present

## 2016-11-23 DIAGNOSIS — J984 Other disorders of lung: Secondary | ICD-10-CM

## 2016-11-23 DIAGNOSIS — F17210 Nicotine dependence, cigarettes, uncomplicated: Secondary | ICD-10-CM

## 2016-11-23 DIAGNOSIS — R911 Solitary pulmonary nodule: Secondary | ICD-10-CM

## 2016-11-23 NOTE — Patient Instructions (Addendum)
--  Use prilosec every day.  --Do not eat 4 hours before bedtime.   --Quitting smoking is the most important thing that you can do for your health.  --Quitting smoking will have greater affect on your health than any medicine that we can give you.

## 2016-12-04 DIAGNOSIS — H359 Unspecified retinal disorder: Secondary | ICD-10-CM | POA: Diagnosis not present

## 2016-12-18 ENCOUNTER — Other Ambulatory Visit: Payer: Self-pay | Admitting: Family Medicine

## 2016-12-22 ENCOUNTER — Other Ambulatory Visit: Payer: Self-pay | Admitting: Family Medicine

## 2016-12-22 MED ORDER — SIMVASTATIN 40 MG PO TABS: 40.0000 mg | ORAL_TABLET | Freq: Every day | ORAL | 3 refills | Status: DC

## 2016-12-22 NOTE — Telephone Encounter (Signed)
Please review. Thanks!  

## 2016-12-22 NOTE — Telephone Encounter (Signed)
[facility] following medications:  simvastatin (ZOCOR) 40 MG tablet.  90 day supply   [facility]

## 2016-12-28 DIAGNOSIS — H35383 Toxic maculopathy, bilateral: Secondary | ICD-10-CM | POA: Diagnosis not present

## 2016-12-28 DIAGNOSIS — H2513 Age-related nuclear cataract, bilateral: Secondary | ICD-10-CM | POA: Diagnosis not present

## 2016-12-28 DIAGNOSIS — H359 Unspecified retinal disorder: Secondary | ICD-10-CM | POA: Insufficient documentation

## 2017-01-11 ENCOUNTER — Telehealth: Payer: Self-pay | Admitting: *Deleted

## 2017-01-11 DIAGNOSIS — Z87891 Personal history of nicotine dependence: Secondary | ICD-10-CM

## 2017-01-11 DIAGNOSIS — Z122 Encounter for screening for malignant neoplasm of respiratory organs: Secondary | ICD-10-CM

## 2017-01-11 NOTE — Telephone Encounter (Signed)
Notified patient that annual lung cancer screening low dose CT scan is due currently or will be in near future. Confirmed that patient is within the age range of 55-77, and asymptomatic, (no signs or symptoms of lung cancer). Patient denies illness that would prevent curative treatment for lung cancer if found. Verified smoking history, (current, 51 pack year). The shared decision making visit was done 08/10/15. Patient is agreeable for CT scan being scheduled.

## 2017-01-25 ENCOUNTER — Ambulatory Visit
Admission: RE | Admit: 2017-01-25 | Discharge: 2017-01-25 | Disposition: A | Discharge status: Home / Self Care | Payer: PPO | Source: Ambulatory Visit | Attending: Nurse Practitioner | Admitting: Nurse Practitioner

## 2017-01-25 DIAGNOSIS — K449 Diaphragmatic hernia without obstruction or gangrene: Secondary | ICD-10-CM | POA: Insufficient documentation

## 2017-01-25 DIAGNOSIS — F17210 Nicotine dependence, cigarettes, uncomplicated: Secondary | ICD-10-CM | POA: Diagnosis not present

## 2017-01-25 DIAGNOSIS — Z87891 Personal history of nicotine dependence: Secondary | ICD-10-CM | POA: Insufficient documentation

## 2017-01-25 DIAGNOSIS — I2510 Atherosclerotic heart disease of native coronary artery without angina pectoris: Secondary | ICD-10-CM | POA: Insufficient documentation

## 2017-01-25 DIAGNOSIS — J438 Other emphysema: Secondary | ICD-10-CM | POA: Diagnosis not present

## 2017-01-25 DIAGNOSIS — R911 Solitary pulmonary nodule: Secondary | ICD-10-CM | POA: Diagnosis not present

## 2017-01-25 DIAGNOSIS — Z122 Encounter for screening for malignant neoplasm of respiratory organs: Secondary | ICD-10-CM | POA: Diagnosis not present

## 2017-01-25 DIAGNOSIS — I700 Atherosclerosis of aorta: Secondary | ICD-10-CM | POA: Insufficient documentation

## 2017-01-29 ENCOUNTER — Encounter: Payer: Self-pay | Admitting: Family Medicine

## 2017-01-29 ENCOUNTER — Telehealth: Payer: Self-pay | Admitting: *Deleted

## 2017-01-29 DIAGNOSIS — I2510 Atherosclerotic heart disease of native coronary artery without angina pectoris: Secondary | ICD-10-CM | POA: Insufficient documentation

## 2017-01-29 DIAGNOSIS — R911 Solitary pulmonary nodule: Secondary | ICD-10-CM | POA: Insufficient documentation

## 2017-01-29 NOTE — Telephone Encounter (Signed)
Notified patient of LDCT lung cancer screening program results with recommendation for 3 month follow up imaging. Also notified of incidental findings noted below and is encouraged to discuss further with PCP who will receive a copy of this note and/or the CT report. Patient verbalizes understanding.   IMPRESSION: 1. Lung-RADS 4A, suspicious. Follow up low-dose chest CT without contrast in 3 months (please use the following order, "CT CHEST LCS NODULE FOLLOW-UP W/O CM") is recommended. Alternatively, PET may be considered when there is a solid component 16mm or larger. New cavitary right lower lobe pulmonary nodule of volume derived equivalent diameter 7.5 mm. A left upper lobe partially calcified pulmonary nodule has enlarged compared to the 08/10/2015 screening CT. Not felt to be significantly changed compared to the diagnostic CT of 12/15/2015. Recommend attention on follow-up. 2.  Emphysema (ICD10-J43.9). 3. Coronary artery atherosclerosis. Aortic Atherosclerosis (ICD10-I70.0). 4. Hiatal hernia.

## 2017-02-05 DIAGNOSIS — H3554 Dystrophies primarily involving the retinal pigment epithelium: Secondary | ICD-10-CM | POA: Diagnosis not present

## 2017-02-05 DIAGNOSIS — H2513 Age-related nuclear cataract, bilateral: Secondary | ICD-10-CM | POA: Diagnosis not present

## 2017-02-05 DIAGNOSIS — H35723 Serous detachment of retinal pigment epithelium, bilateral: Secondary | ICD-10-CM | POA: Diagnosis not present

## 2017-02-05 DIAGNOSIS — H35363 Drusen (degenerative) of macula, bilateral: Secondary | ICD-10-CM | POA: Diagnosis not present

## 2017-02-22 DIAGNOSIS — F3172 Bipolar disorder, in full remission, most recent episode hypomanic: Secondary | ICD-10-CM | POA: Diagnosis not present

## 2017-03-22 DIAGNOSIS — H35451 Secondary pigmentary degeneration, right eye: Secondary | ICD-10-CM | POA: Diagnosis not present

## 2017-03-22 DIAGNOSIS — H35362 Drusen (degenerative) of macula, left eye: Secondary | ICD-10-CM | POA: Diagnosis not present

## 2017-03-22 DIAGNOSIS — H35723 Serous detachment of retinal pigment epithelium, bilateral: Secondary | ICD-10-CM | POA: Diagnosis not present

## 2017-03-22 DIAGNOSIS — H3554 Dystrophies primarily involving the retinal pigment epithelium: Secondary | ICD-10-CM | POA: Diagnosis not present

## 2017-04-09 DIAGNOSIS — M4716 Other spondylosis with myelopathy, lumbar region: Secondary | ICD-10-CM | POA: Diagnosis not present

## 2017-04-16 ENCOUNTER — Other Ambulatory Visit: Payer: Self-pay | Admitting: Physical Medicine and Rehabilitation

## 2017-04-16 DIAGNOSIS — M5126 Other intervertebral disc displacement, lumbar region: Secondary | ICD-10-CM | POA: Diagnosis not present

## 2017-04-16 DIAGNOSIS — M5416 Radiculopathy, lumbar region: Secondary | ICD-10-CM

## 2017-04-16 DIAGNOSIS — M62830 Muscle spasm of back: Secondary | ICD-10-CM | POA: Diagnosis not present

## 2017-04-19 ENCOUNTER — Telehealth: Payer: Self-pay | Admitting: *Deleted

## 2017-04-19 ENCOUNTER — Ambulatory Visit
Admission: RE | Admit: 2017-04-19 | Discharge: 2017-04-19 | Disposition: A | Discharge status: Home / Self Care | Payer: PPO | Source: Ambulatory Visit | Attending: Physical Medicine and Rehabilitation | Admitting: Physical Medicine and Rehabilitation

## 2017-04-19 DIAGNOSIS — M48061 Spinal stenosis, lumbar region without neurogenic claudication: Secondary | ICD-10-CM | POA: Insufficient documentation

## 2017-04-19 DIAGNOSIS — M5416 Radiculopathy, lumbar region: Secondary | ICD-10-CM

## 2017-04-19 DIAGNOSIS — M5126 Other intervertebral disc displacement, lumbar region: Secondary | ICD-10-CM | POA: Insufficient documentation

## 2017-04-19 DIAGNOSIS — M545 Low back pain: Secondary | ICD-10-CM | POA: Diagnosis not present

## 2017-04-19 NOTE — Telephone Encounter (Signed)
Left message for patient to notify them that it is time to schedule low dose lung cancer screening CT scan. Instructed patient to call back to verify information prior to the scan being scheduled.

## 2017-04-20 ENCOUNTER — Telehealth: Payer: Self-pay | Admitting: *Deleted

## 2017-04-20 ENCOUNTER — Telehealth: Payer: Self-pay | Admitting: Family Medicine

## 2017-04-20 DIAGNOSIS — R918 Other nonspecific abnormal finding of lung field: Secondary | ICD-10-CM

## 2017-04-20 NOTE — Telephone Encounter (Signed)
Notified patient that lung cancer screening low dose CT scan follow up imaging is due currently or will be in near future. Attempted to schedule CT but patient reports he is in severe pain with hip problems that are being addressed. Patient reports he cannot go for scan at this time. He understands the importance of the lung rads 4A follow up. He is in agreement with plan for me to follow up with him next week.

## 2017-04-20 NOTE — Telephone Encounter (Signed)
Please advise patient it is time for follow up CT of lungs due to lung nodule that seen on on CT in January. Have already sent order to sarah and he should get call for her to schedule.

## 2017-04-20 NOTE — Telephone Encounter (Signed)
Advised patient as below.  

## 2017-04-20 NOTE — Telephone Encounter (Signed)
Please review. Thanks!  

## 2017-04-24 DIAGNOSIS — M5126 Other intervertebral disc displacement, lumbar region: Secondary | ICD-10-CM | POA: Diagnosis not present

## 2017-04-24 DIAGNOSIS — M5416 Radiculopathy, lumbar region: Secondary | ICD-10-CM | POA: Diagnosis not present

## 2017-04-24 IMAGING — CT CT BIOPSY
1 of 2 series · 13 of 32 positions shown, 19 images · non-contrast
Comparison: none

CLINICAL DATA: Hypermetabolic left upper lobe pulmonary nodule

[Series 2: i-spiral 5.0 b40f · axial · 0.84mm/px · z∈[+1276,+1367]mm · 13 of 30 slices shown, 19 images]
[im 2/30  soft-tissue]
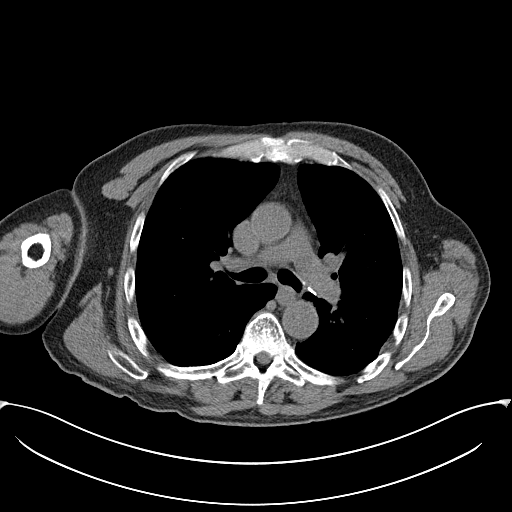
[im 2/30  bone]
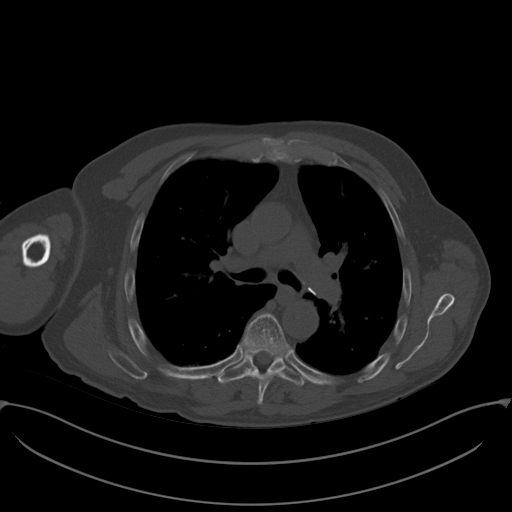
[im 4/30  soft-tissue]
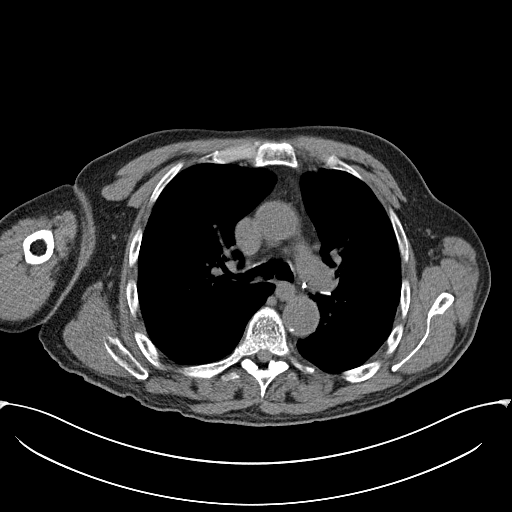
[im 6/30  soft-tissue]
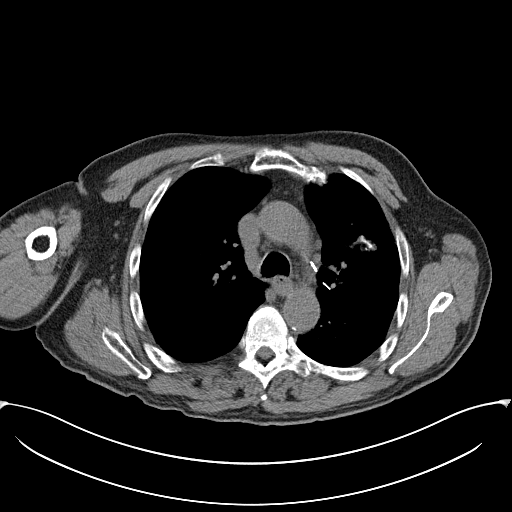
[im 8/30  soft-tissue]
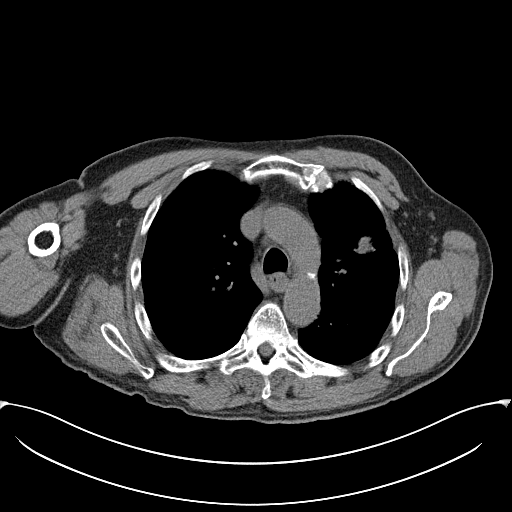
[im 10/30  soft-tissue]
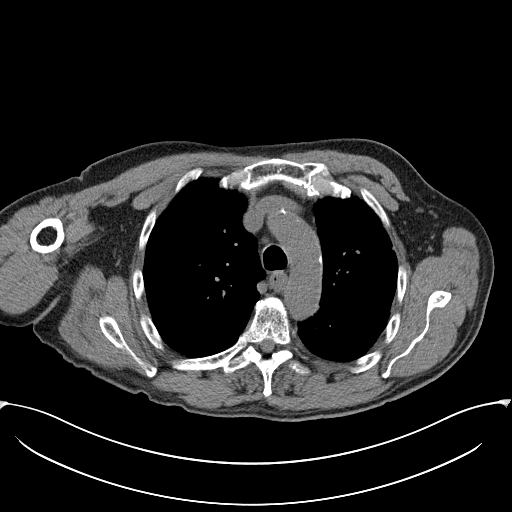
[im 12/30  soft-tissue]
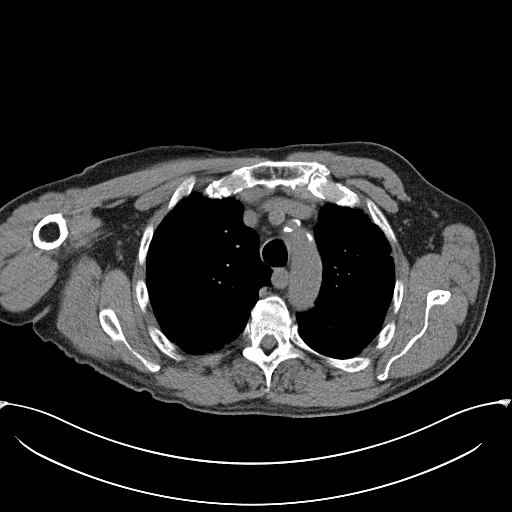
[im 16/30  soft-tissue]
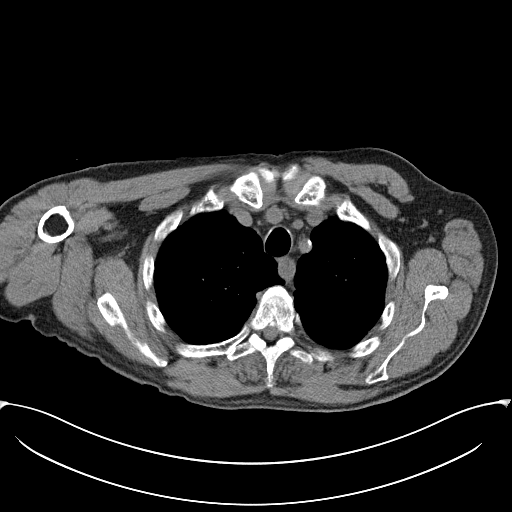
[im 18/30  soft-tissue]
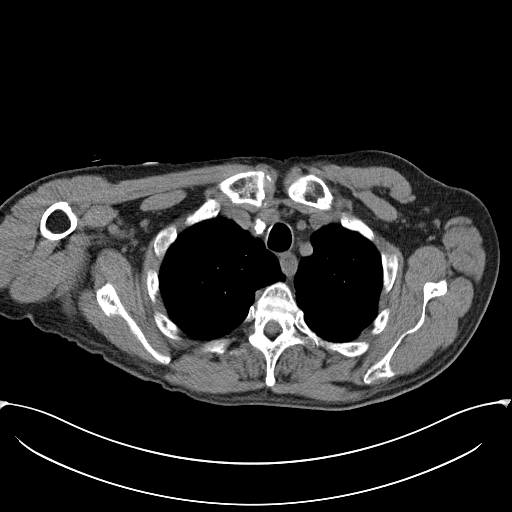
[im 20/30  soft-tissue]
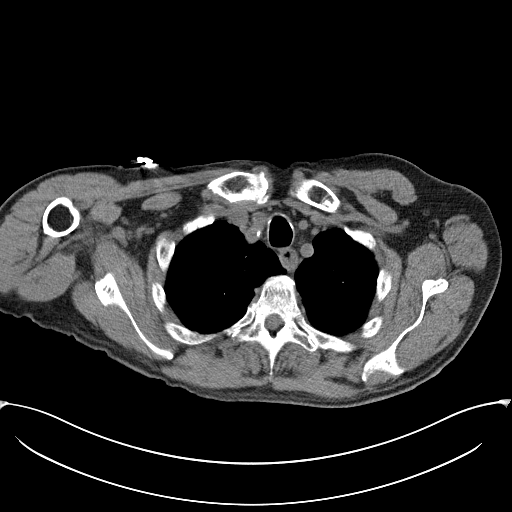
[im 20/30  bone]
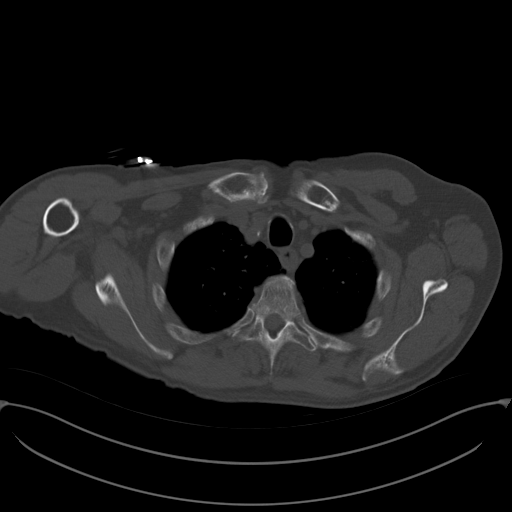
[im 22/30  soft-tissue]
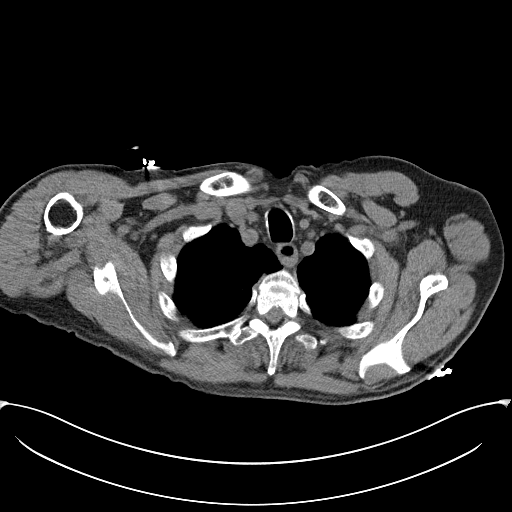
[im 22/30  lung]
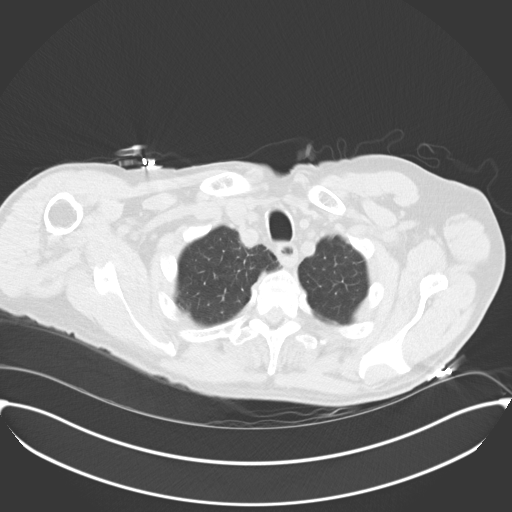
[im 24/30  soft-tissue]
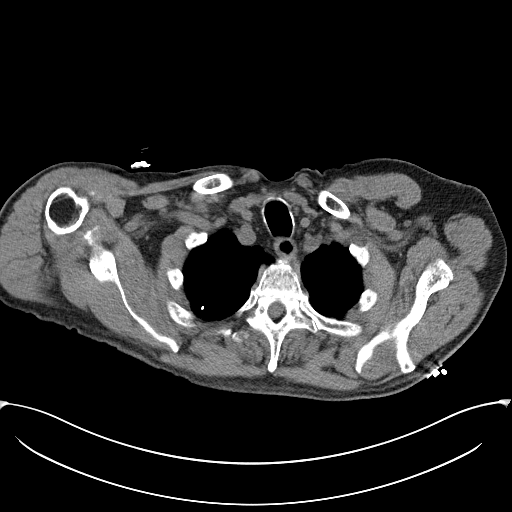
[im 24/30  lung]
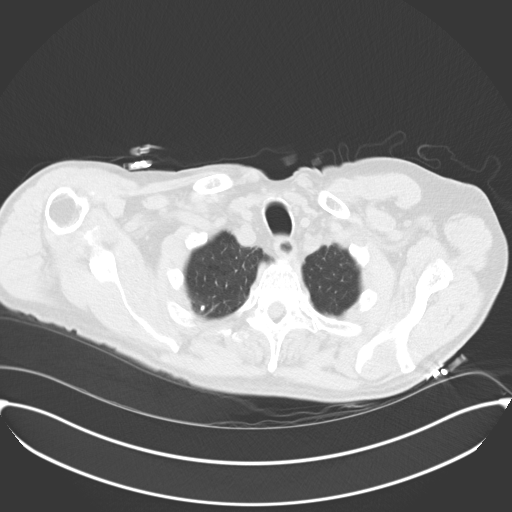
[im 26/30  soft-tissue]
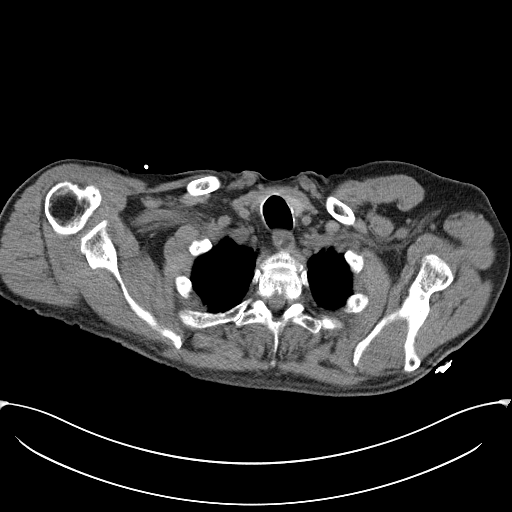
[im 26/30  lung]
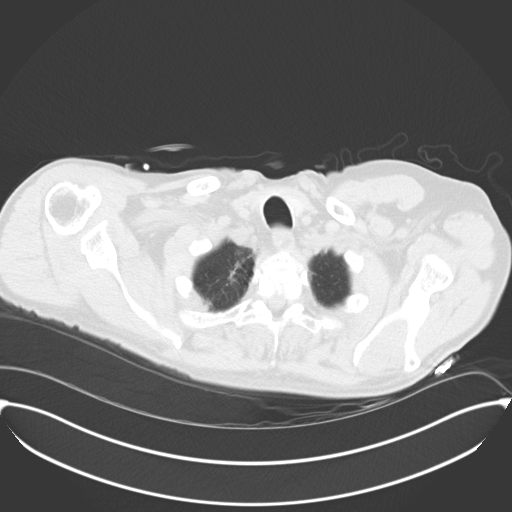
[im 28/30  soft-tissue]
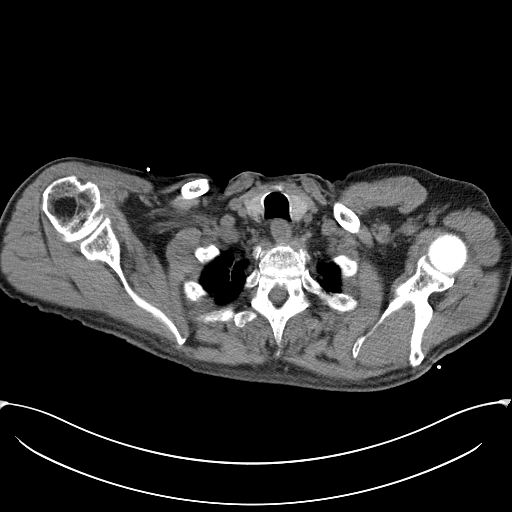
[im 28/30  lung]
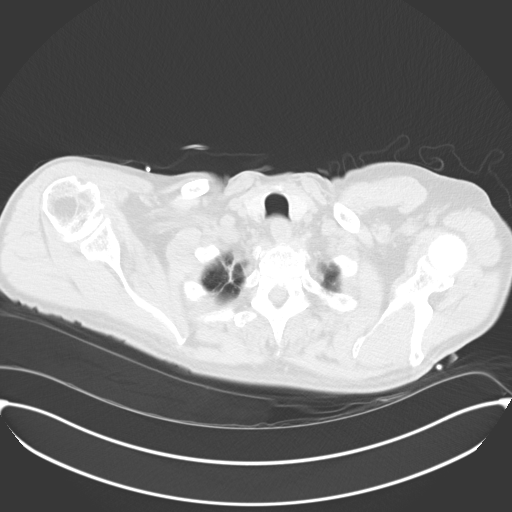

[13 of 32 positions shown; findings below may reference images not displayed]

EXAM:
CT GUIDED CORE BIOPSY OF LEFT UPPER LOBE LUNG NODULE

ANESTHESIA/SEDATION:
Intravenous Fentanyl and Versed were administered as conscious
sedation during continuous monitoring of the patient's level of
consciousness and physiological / cardiorespiratory status by the
radiology RN, with a total moderate sedation time of 11 minutes.

PROCEDURE:
The procedure risks, benefits, and alternatives were explained to
the patient. Questions regarding the procedure were encouraged and
answered. The patient understands and consents to the procedure.

Select axial scans through the thorax were obtained. The lesion was
localized and an appropriate skin entry site was determined and
marked.

The operative field was prepped with chlorhexidinein a sterile
fashion, and a sterile drape was applied covering the operative
field. A sterile gown and sterile gloves were used for the
procedure. Local anesthesia was provided with 1% Lidocaine.

Under CT fluoroscopic guidance, a 17 gauge trocar needle was
advanced to the margin of the lesion. Once needle tip position was
confirmed, coaxial 18-gauge core biopsy samples were obtained,
submitted in formalin to surgical pathology. The guide needle was
removed with deployment of the BioSentryT tract sealant system.

Postprocedure images demonstrate minimal regional alveolar
hemorrhage and a tiny lateral loculated pneumothorax at the needle
entry site, stable on repeat scanning. Patient remained
asymptomatic.

COMPLICATIONS:
None immediate
FINDINGS: The spiculated left upper lobe nodule was again localized. Core
biopsy samples obtained without immediate complication.
IMPRESSION: 1. Technically successful CT-guided left upper lobe lung lesion core
biopsy.
Observation and follow-up chest radiography is scheduled.

## 2017-04-24 IMAGING — CR DG CHEST 1V
1 series · 1 of 1 positions shown · non-contrast
Comparison: Chest x-ray dated 06/14/2010 and CT-guided biopsy
performed earlier today.

CLINICAL DATA: Status post lung biopsy this morning.

EXAM:
CHEST 1 VIEW

[x chest ap]
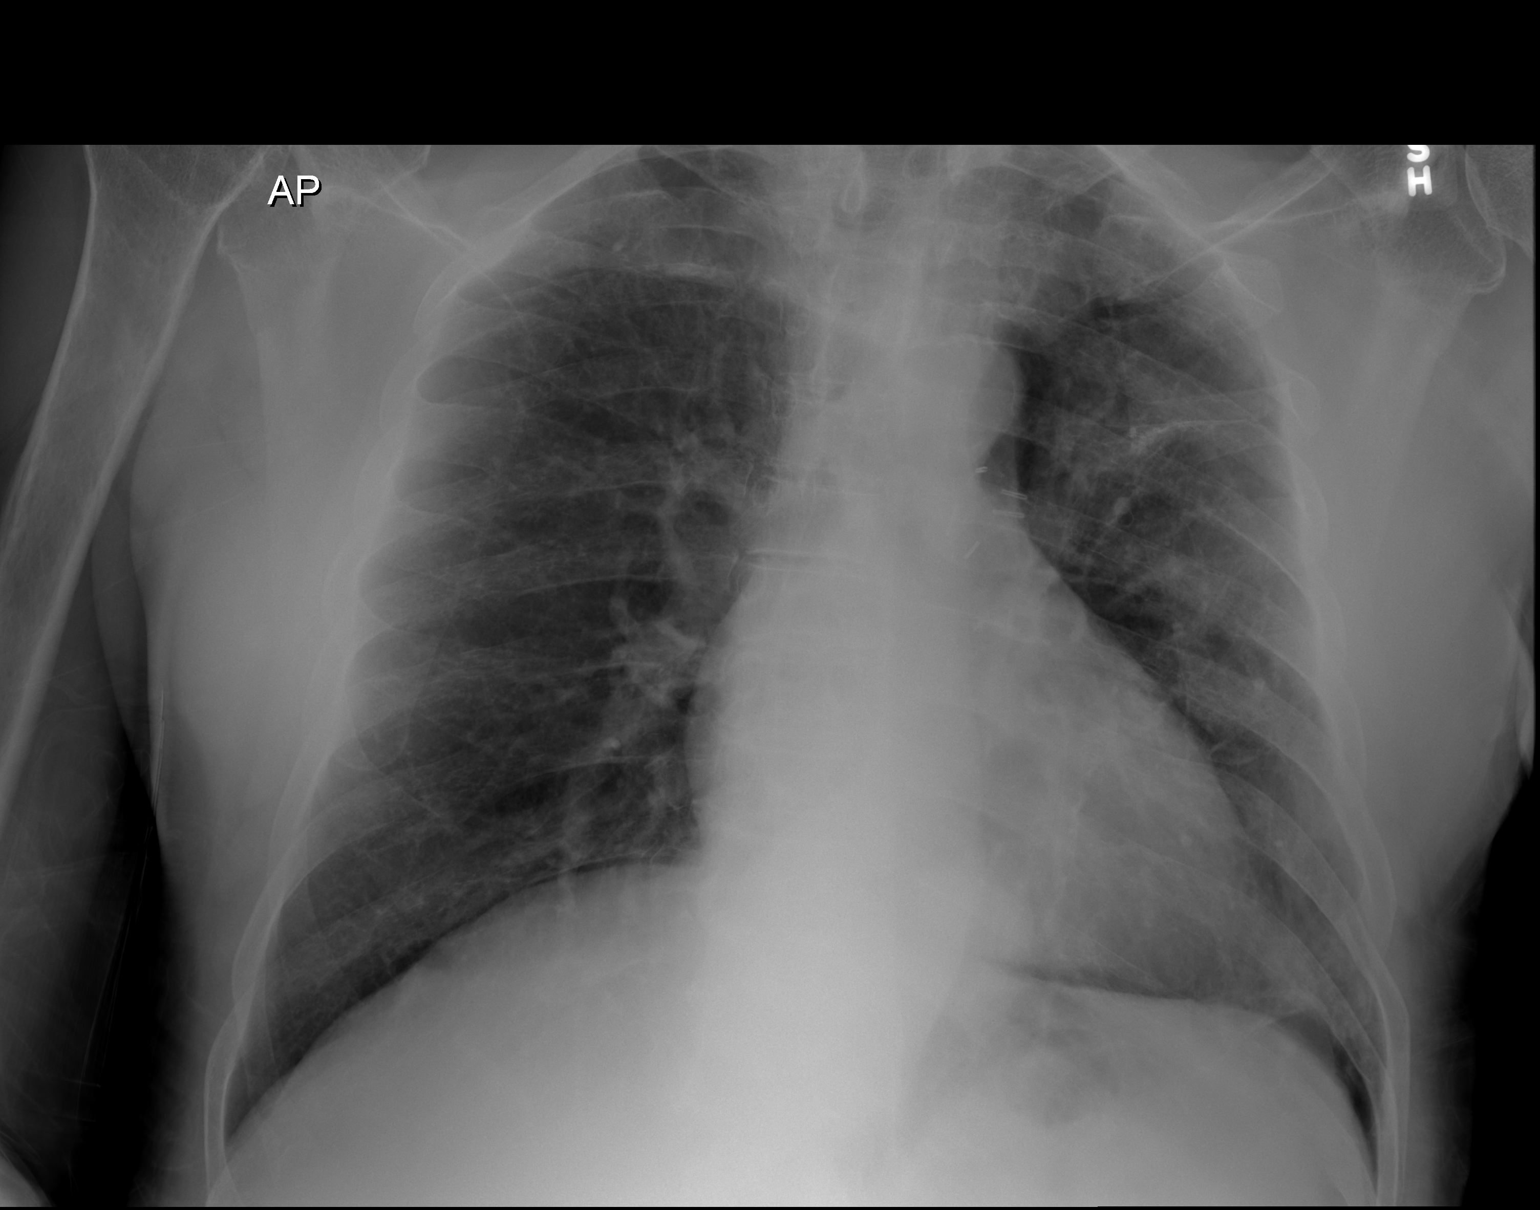

[1 of 1 positions shown; findings below may reference images not displayed]

FINDINGS: Expected mild edema within the left lung status post CT-guided
biopsy. Possible small pneumothorax at the left lung base. Right
lung remains clear. Cardiomediastinal silhouette is stable.
IMPRESSION: Possible small pneumothorax at the left costophrenic angle.
Interventional radiologist is aware.

## 2017-04-30 NOTE — Telephone Encounter (Signed)
Confirmed that patient is within the age range of 55-77, and asymptomatic, (no signs or symptoms of lung cancer). Patient denies illness that would prevent curative treatment for lung cancer if found. Verified smoking history, (current, 51 pack year). The shared decision making visit was done 08/10/15.

## 2017-05-02 ENCOUNTER — Ambulatory Visit
Admission: RE | Admit: 2017-05-02 | Discharge: 2017-05-02 | Disposition: A | Discharge status: Home / Self Care | Payer: PPO | Source: Ambulatory Visit | Attending: Family Medicine | Admitting: Family Medicine

## 2017-05-02 ENCOUNTER — Encounter: Payer: Self-pay | Admitting: Family Medicine

## 2017-05-02 DIAGNOSIS — K449 Diaphragmatic hernia without obstruction or gangrene: Secondary | ICD-10-CM | POA: Diagnosis not present

## 2017-05-02 DIAGNOSIS — J439 Emphysema, unspecified: Secondary | ICD-10-CM | POA: Diagnosis not present

## 2017-05-02 DIAGNOSIS — I700 Atherosclerosis of aorta: Secondary | ICD-10-CM | POA: Insufficient documentation

## 2017-05-02 DIAGNOSIS — Z87891 Personal history of nicotine dependence: Secondary | ICD-10-CM | POA: Insufficient documentation

## 2017-05-02 DIAGNOSIS — R918 Other nonspecific abnormal finding of lung field: Secondary | ICD-10-CM | POA: Insufficient documentation

## 2017-05-02 DIAGNOSIS — R911 Solitary pulmonary nodule: Secondary | ICD-10-CM | POA: Diagnosis not present

## 2017-05-07 ENCOUNTER — Encounter: Payer: Self-pay | Admitting: *Deleted

## 2017-05-09 DIAGNOSIS — M9903 Segmental and somatic dysfunction of lumbar region: Secondary | ICD-10-CM | POA: Diagnosis not present

## 2017-05-09 DIAGNOSIS — M62830 Muscle spasm of back: Secondary | ICD-10-CM | POA: Diagnosis not present

## 2017-05-09 DIAGNOSIS — M5432 Sciatica, left side: Secondary | ICD-10-CM | POA: Diagnosis not present

## 2017-05-09 DIAGNOSIS — M5136 Other intervertebral disc degeneration, lumbar region: Secondary | ICD-10-CM | POA: Diagnosis not present

## 2017-05-10 DIAGNOSIS — M62830 Muscle spasm of back: Secondary | ICD-10-CM | POA: Diagnosis not present

## 2017-05-10 DIAGNOSIS — M5136 Other intervertebral disc degeneration, lumbar region: Secondary | ICD-10-CM | POA: Diagnosis not present

## 2017-05-10 DIAGNOSIS — M9903 Segmental and somatic dysfunction of lumbar region: Secondary | ICD-10-CM | POA: Diagnosis not present

## 2017-05-10 DIAGNOSIS — M5432 Sciatica, left side: Secondary | ICD-10-CM | POA: Diagnosis not present

## 2017-05-11 DIAGNOSIS — M5136 Other intervertebral disc degeneration, lumbar region: Secondary | ICD-10-CM | POA: Diagnosis not present

## 2017-05-11 DIAGNOSIS — M62830 Muscle spasm of back: Secondary | ICD-10-CM | POA: Diagnosis not present

## 2017-05-11 DIAGNOSIS — M5432 Sciatica, left side: Secondary | ICD-10-CM | POA: Diagnosis not present

## 2017-05-11 DIAGNOSIS — M9903 Segmental and somatic dysfunction of lumbar region: Secondary | ICD-10-CM | POA: Diagnosis not present

## 2017-05-14 DIAGNOSIS — M9903 Segmental and somatic dysfunction of lumbar region: Secondary | ICD-10-CM | POA: Diagnosis not present

## 2017-05-14 DIAGNOSIS — M5136 Other intervertebral disc degeneration, lumbar region: Secondary | ICD-10-CM | POA: Diagnosis not present

## 2017-05-14 DIAGNOSIS — M5432 Sciatica, left side: Secondary | ICD-10-CM | POA: Diagnosis not present

## 2017-05-14 DIAGNOSIS — M62830 Muscle spasm of back: Secondary | ICD-10-CM | POA: Diagnosis not present

## 2017-05-16 DIAGNOSIS — M9903 Segmental and somatic dysfunction of lumbar region: Secondary | ICD-10-CM | POA: Diagnosis not present

## 2017-05-16 DIAGNOSIS — M5136 Other intervertebral disc degeneration, lumbar region: Secondary | ICD-10-CM | POA: Diagnosis not present

## 2017-05-16 DIAGNOSIS — M5432 Sciatica, left side: Secondary | ICD-10-CM | POA: Diagnosis not present

## 2017-05-16 DIAGNOSIS — M62830 Muscle spasm of back: Secondary | ICD-10-CM | POA: Diagnosis not present

## 2017-05-17 DIAGNOSIS — M62830 Muscle spasm of back: Secondary | ICD-10-CM | POA: Diagnosis not present

## 2017-05-17 DIAGNOSIS — M5432 Sciatica, left side: Secondary | ICD-10-CM | POA: Diagnosis not present

## 2017-05-17 DIAGNOSIS — M5136 Other intervertebral disc degeneration, lumbar region: Secondary | ICD-10-CM | POA: Diagnosis not present

## 2017-05-17 DIAGNOSIS — M9903 Segmental and somatic dysfunction of lumbar region: Secondary | ICD-10-CM | POA: Diagnosis not present

## 2017-05-21 DIAGNOSIS — M9903 Segmental and somatic dysfunction of lumbar region: Secondary | ICD-10-CM | POA: Diagnosis not present

## 2017-05-21 DIAGNOSIS — M5136 Other intervertebral disc degeneration, lumbar region: Secondary | ICD-10-CM | POA: Diagnosis not present

## 2017-05-21 DIAGNOSIS — M62830 Muscle spasm of back: Secondary | ICD-10-CM | POA: Diagnosis not present

## 2017-05-21 DIAGNOSIS — M5432 Sciatica, left side: Secondary | ICD-10-CM | POA: Diagnosis not present

## 2017-05-23 DIAGNOSIS — M5432 Sciatica, left side: Secondary | ICD-10-CM | POA: Diagnosis not present

## 2017-05-23 DIAGNOSIS — M62830 Muscle spasm of back: Secondary | ICD-10-CM | POA: Diagnosis not present

## 2017-05-23 DIAGNOSIS — M9903 Segmental and somatic dysfunction of lumbar region: Secondary | ICD-10-CM | POA: Diagnosis not present

## 2017-05-23 DIAGNOSIS — M5136 Other intervertebral disc degeneration, lumbar region: Secondary | ICD-10-CM | POA: Diagnosis not present

## 2017-05-24 DIAGNOSIS — M9903 Segmental and somatic dysfunction of lumbar region: Secondary | ICD-10-CM | POA: Diagnosis not present

## 2017-05-24 DIAGNOSIS — M5136 Other intervertebral disc degeneration, lumbar region: Secondary | ICD-10-CM | POA: Diagnosis not present

## 2017-05-24 DIAGNOSIS — M62830 Muscle spasm of back: Secondary | ICD-10-CM | POA: Diagnosis not present

## 2017-05-24 DIAGNOSIS — M5432 Sciatica, left side: Secondary | ICD-10-CM | POA: Diagnosis not present

## 2017-05-28 DIAGNOSIS — M9903 Segmental and somatic dysfunction of lumbar region: Secondary | ICD-10-CM | POA: Diagnosis not present

## 2017-05-28 DIAGNOSIS — M5432 Sciatica, left side: Secondary | ICD-10-CM | POA: Diagnosis not present

## 2017-05-28 DIAGNOSIS — M62830 Muscle spasm of back: Secondary | ICD-10-CM | POA: Diagnosis not present

## 2017-05-28 DIAGNOSIS — M5136 Other intervertebral disc degeneration, lumbar region: Secondary | ICD-10-CM | POA: Diagnosis not present

## 2017-05-30 DIAGNOSIS — M5126 Other intervertebral disc displacement, lumbar region: Secondary | ICD-10-CM | POA: Diagnosis not present

## 2017-05-30 DIAGNOSIS — M62830 Muscle spasm of back: Secondary | ICD-10-CM | POA: Diagnosis not present

## 2017-05-30 DIAGNOSIS — M9903 Segmental and somatic dysfunction of lumbar region: Secondary | ICD-10-CM | POA: Diagnosis not present

## 2017-05-30 DIAGNOSIS — M5136 Other intervertebral disc degeneration, lumbar region: Secondary | ICD-10-CM | POA: Diagnosis not present

## 2017-05-30 DIAGNOSIS — M5432 Sciatica, left side: Secondary | ICD-10-CM | POA: Diagnosis not present

## 2017-05-30 DIAGNOSIS — M5416 Radiculopathy, lumbar region: Secondary | ICD-10-CM | POA: Diagnosis not present

## 2017-05-31 DIAGNOSIS — M5136 Other intervertebral disc degeneration, lumbar region: Secondary | ICD-10-CM | POA: Diagnosis not present

## 2017-05-31 DIAGNOSIS — M9903 Segmental and somatic dysfunction of lumbar region: Secondary | ICD-10-CM | POA: Diagnosis not present

## 2017-05-31 DIAGNOSIS — M62830 Muscle spasm of back: Secondary | ICD-10-CM | POA: Diagnosis not present

## 2017-05-31 DIAGNOSIS — M5432 Sciatica, left side: Secondary | ICD-10-CM | POA: Diagnosis not present

## 2017-06-01 DIAGNOSIS — H2513 Age-related nuclear cataract, bilateral: Secondary | ICD-10-CM | POA: Diagnosis not present

## 2017-06-01 DIAGNOSIS — H353231 Exudative age-related macular degeneration, bilateral, with active choroidal neovascularization: Secondary | ICD-10-CM | POA: Diagnosis not present

## 2017-06-01 DIAGNOSIS — H5203 Hypermetropia, bilateral: Secondary | ICD-10-CM | POA: Diagnosis not present

## 2017-06-01 DIAGNOSIS — H524 Presbyopia: Secondary | ICD-10-CM | POA: Diagnosis not present

## 2017-06-05 DIAGNOSIS — M9903 Segmental and somatic dysfunction of lumbar region: Secondary | ICD-10-CM | POA: Diagnosis not present

## 2017-06-05 DIAGNOSIS — M62830 Muscle spasm of back: Secondary | ICD-10-CM | POA: Diagnosis not present

## 2017-06-05 DIAGNOSIS — M5136 Other intervertebral disc degeneration, lumbar region: Secondary | ICD-10-CM | POA: Diagnosis not present

## 2017-06-05 DIAGNOSIS — M5432 Sciatica, left side: Secondary | ICD-10-CM | POA: Diagnosis not present

## 2017-06-06 DIAGNOSIS — F3172 Bipolar disorder, in full remission, most recent episode hypomanic: Secondary | ICD-10-CM | POA: Diagnosis not present

## 2017-06-07 DIAGNOSIS — M5432 Sciatica, left side: Secondary | ICD-10-CM | POA: Diagnosis not present

## 2017-06-07 DIAGNOSIS — M9903 Segmental and somatic dysfunction of lumbar region: Secondary | ICD-10-CM | POA: Diagnosis not present

## 2017-06-07 DIAGNOSIS — Z79899 Other long term (current) drug therapy: Secondary | ICD-10-CM | POA: Diagnosis not present

## 2017-06-07 DIAGNOSIS — F3172 Bipolar disorder, in full remission, most recent episode hypomanic: Secondary | ICD-10-CM | POA: Diagnosis not present

## 2017-06-07 DIAGNOSIS — M5136 Other intervertebral disc degeneration, lumbar region: Secondary | ICD-10-CM | POA: Diagnosis not present

## 2017-06-07 DIAGNOSIS — M62830 Muscle spasm of back: Secondary | ICD-10-CM | POA: Diagnosis not present

## 2017-06-12 ENCOUNTER — Other Ambulatory Visit: Payer: Self-pay | Admitting: Neurological Surgery

## 2017-06-12 DIAGNOSIS — M5126 Other intervertebral disc displacement, lumbar region: Secondary | ICD-10-CM | POA: Diagnosis not present

## 2017-06-14 DIAGNOSIS — H3554 Dystrophies primarily involving the retinal pigment epithelium: Secondary | ICD-10-CM | POA: Diagnosis not present

## 2017-06-14 DIAGNOSIS — H35362 Drusen (degenerative) of macula, left eye: Secondary | ICD-10-CM | POA: Diagnosis not present

## 2017-06-14 DIAGNOSIS — H35451 Secondary pigmentary degeneration, right eye: Secondary | ICD-10-CM | POA: Diagnosis not present

## 2017-06-14 DIAGNOSIS — H2513 Age-related nuclear cataract, bilateral: Secondary | ICD-10-CM | POA: Diagnosis not present

## 2017-06-14 DIAGNOSIS — H35723 Serous detachment of retinal pigment epithelium, bilateral: Secondary | ICD-10-CM | POA: Diagnosis not present

## 2017-06-18 NOTE — Pre-Procedure Instructions (Signed)
[name]  06/18/2017      [id number] - [place], [place] - [address] [place] [address] Phone: [phone] Fax: [phone]    Your procedure is scheduled on Fri., June 22, 2017 from 3:17PM-4:47PM  Report to [facility] Admitting Entrance "A" at 1:15PM  Call this number if you have problems the morning of surgery:  [phone]   Remember:  No food or drinks after midnight on June 13th    Take these medicines the morning of surgery with A SIP OF WATER: If needed HYDROcodone-acetaminophen (NORCO/VICODIN)  Follow your doctors instructions regarding your Aspirin.  If no instructions were given by your doctor, then you will need to call the prescribing office office to get instructions.    As of today, stop taking all Other Aspirin Products, Vitamins, Fish oils, and Herbal medications. Also stop all NSAIDS i.e. Advil, Ibuprofen, Motrin, Aleve, Anaprox, Naproxen, BC, Goody Powders, and all Supplements.    Do not wear jewelry.  Do not wear lotions, powders, colognes, or deodorant.  Do not shave 48 hours prior to surgery.  Men may shave face.  Do not bring valuables to the hospital.  [facility] is not responsible for any belongings or valuables.  Contacts, dentures or bridgework may not be worn into surgery.  Leave your suitcase in the car.  After surgery it may be brought to your room.  For patients admitted to the hospital, discharge time will be determined by your treatment team.  Patients discharged the day of surgery will not be allowed to drive home.   Special instructions:   [place]- Preparing For Surgery  Before surgery, you can play an important role. Because skin is not sterile, your skin needs to be as free of germs as possible. You can reduce the number of germs on your skin by washing with CHG (chlorahexidine gluconate) Soap before surgery.  CHG is an antiseptic cleaner which kills germs and bonds with the  skin to continue killing germs even after washing.    Oral Hygiene is also important to reduce your risk of infection.  Remember - BRUSH YOUR TEETH THE MORNING OF SURGERY WITH YOUR REGULAR TOOTHPASTE  Please do not use if you have an allergy to CHG or antibacterial soaps. If your skin becomes reddened/irritated stop using the CHG.  Do not shave (including legs and underarms) for at least 48 hours prior to first CHG shower. It is OK to shave your face.  Please follow these instructions carefully.   1. Shower the NIGHT BEFORE SURGERY and the MORNING OF SURGERY with CHG.   2. If you chose to wash your hair, wash your hair first as usual with your normal shampoo.  3. After you shampoo, rinse your hair and body thoroughly to remove the shampoo.  4. Use CHG as you would any other liquid soap. You can apply CHG directly to the skin and wash gently with a scrungie or a clean washcloth.   5. Apply the CHG Soap to your body ONLY FROM THE NECK DOWN.  Do not use on open wounds or open sores. Avoid contact with your eyes, ears, mouth and genitals (private parts). Wash Face and genitals (private parts)  with your normal soap.  6. Wash thoroughly, paying special attention to the area where your surgery will be performed.  7. Thoroughly rinse your body with warm water from the neck down.  8. DO NOT shower/wash with your normal soap after  using and rinsing off the CHG Soap.  9. Pat yourself dry with a CLEAN TOWEL.  10. Wear CLEAN PAJAMAS to bed the night before surgery, wear comfortable clothes the morning of surgery  11. Place CLEAN SHEETS on your bed the night of your first shower and DO NOT SLEEP WITH PETS.  Day of Surgery:  Do not apply any deodorants/lotions.  Please wear clean clothes to the hospital/surgery center.   Remember to brush your teeth WITH YOUR REGULAR TOOTHPASTE.  Please read over the following fact sheets that you were given. Pain Booklet, Coughing and Deep Breathing, MRSA  Information and Surgical Site Infection Prevention

## 2017-06-19 ENCOUNTER — Encounter (HOSPITAL_COMMUNITY): Payer: Self-pay

## 2017-06-19 ENCOUNTER — Encounter (HOSPITAL_COMMUNITY)
Admission: RE | Admit: 2017-06-19 | Discharge: 2017-06-19 | Disposition: A | Discharge status: Home / Self Care | Payer: PPO | Source: Ambulatory Visit | Attending: Neurological Surgery | Admitting: Neurological Surgery

## 2017-06-19 ENCOUNTER — Other Ambulatory Visit: Payer: Self-pay

## 2017-06-19 DIAGNOSIS — F209 Schizophrenia, unspecified: Secondary | ICD-10-CM | POA: Diagnosis not present

## 2017-06-19 DIAGNOSIS — F17210 Nicotine dependence, cigarettes, uncomplicated: Secondary | ICD-10-CM | POA: Diagnosis not present

## 2017-06-19 DIAGNOSIS — M5116 Intervertebral disc disorders with radiculopathy, lumbar region: Secondary | ICD-10-CM | POA: Diagnosis not present

## 2017-06-19 DIAGNOSIS — Z7982 Long term (current) use of aspirin: Secondary | ICD-10-CM | POA: Diagnosis not present

## 2017-06-19 DIAGNOSIS — F429 Obsessive-compulsive disorder, unspecified: Secondary | ICD-10-CM | POA: Diagnosis not present

## 2017-06-19 DIAGNOSIS — K219 Gastro-esophageal reflux disease without esophagitis: Secondary | ICD-10-CM | POA: Diagnosis not present

## 2017-06-19 DIAGNOSIS — Z888 Allergy status to other drugs, medicaments and biological substances status: Secondary | ICD-10-CM | POA: Diagnosis not present

## 2017-06-19 DIAGNOSIS — Z8249 Family history of ischemic heart disease and other diseases of the circulatory system: Secondary | ICD-10-CM | POA: Diagnosis not present

## 2017-06-19 DIAGNOSIS — G629 Polyneuropathy, unspecified: Secondary | ICD-10-CM | POA: Diagnosis not present

## 2017-06-19 DIAGNOSIS — J439 Emphysema, unspecified: Secondary | ICD-10-CM | POA: Diagnosis not present

## 2017-06-19 DIAGNOSIS — Z01812 Encounter for preprocedural laboratory examination: Secondary | ICD-10-CM | POA: Insufficient documentation

## 2017-06-19 DIAGNOSIS — F419 Anxiety disorder, unspecified: Secondary | ICD-10-CM | POA: Diagnosis not present

## 2017-06-19 DIAGNOSIS — G2581 Restless legs syndrome: Secondary | ICD-10-CM | POA: Diagnosis not present

## 2017-06-19 DIAGNOSIS — Z79899 Other long term (current) drug therapy: Secondary | ICD-10-CM | POA: Diagnosis not present

## 2017-06-19 HISTORY — DX: Other intervertebral disc displacement, lumbar region: M51.26

## 2017-06-19 LAB — BASIC METABOLIC PANEL
Anion gap: 10 (ref 5–15)
BUN: 11 mg/dL (ref 6–20)
CO2: 26 mmol/L (ref 22–32)
Calcium: 9.5 mg/dL (ref 8.9–10.3)
Chloride: 102 mmol/L (ref 101–111)
Creatinine, Ser: 1.1 mg/dL (ref 0.61–1.24)
GFR calc Af Amer: >60 mL/min (ref >60)
GFR calc non Af Amer: >60 mL/min (ref >60)
Glucose, Bld: 151 mg/dL — ABNORMAL HIGH (ref 65–99)
Potassium: 4.4 mmol/L (ref 3.5–5.1)
Sodium: 138 mmol/L (ref 135–145)

## 2017-06-19 LAB — CBC
HCT: 43.8 % (ref 39.0–52.0)
Hemoglobin: 14.8 g/dL (ref 13.0–17.0)
MCH: 33.4 pg (ref 26.0–34.0)
MCHC: 33.8 g/dL (ref 30.0–36.0)
MCV: 98.9 fL (ref 78.0–100.0)
Platelets: 171 10*3/uL (ref 150–400)
RBC: 4.43 MIL/uL (ref 4.22–5.81)
RDW: 13.3 % (ref 11.5–15.5)
WBC: 5.9 10*3/uL (ref 4.0–10.5)

## 2017-06-19 LAB — SURGICAL PCR SCREEN
MRSA, PCR: NEGATIVE
Staphylococcus aureus: NEGATIVE

## 2017-06-19 NOTE — Progress Notes (Signed)
PCP - Dr. Lelon Huh  Cardiologist - Denies  Chest x-ray - Denies  EKG - Denies  Stress Test - Denies  ECHO - Denies  Cardiac Cath - Denies  Sleep Study - Denies CPAP - None  LABS- 06/19/17: CBC, BMP  ASA- LD-6/7   Anesthesia- No  Pt denies having chest pain, sob, or fever at this time. All instructions explained to the pt, with a verbal understanding of the material. Pt agrees to go over the instructions while at home for a better understanding. The opportunity to ask questions was provided.

## 2017-06-22 ENCOUNTER — Other Ambulatory Visit: Payer: Self-pay

## 2017-06-22 ENCOUNTER — Ambulatory Visit (HOSPITAL_COMMUNITY): Payer: PPO | Admitting: Certified Registered"

## 2017-06-22 ENCOUNTER — Ambulatory Visit (HOSPITAL_COMMUNITY): Payer: PPO

## 2017-06-22 ENCOUNTER — Encounter (HOSPITAL_COMMUNITY)
Admission: RE | Disposition: A | Discharge status: Home / Self Care | Payer: Self-pay | Source: Ambulatory Visit | Attending: Neurological Surgery

## 2017-06-22 ENCOUNTER — Encounter (HOSPITAL_COMMUNITY): Payer: Self-pay | Admitting: Surgery

## 2017-06-22 ENCOUNTER — Observation Stay (HOSPITAL_COMMUNITY)
Admission: RE | Admit: 2017-06-22 | Discharge: 2017-06-23 | Disposition: A | Discharge status: Home / Self Care | Payer: PPO | Source: Ambulatory Visit | Attending: Neurological Surgery | Admitting: Neurological Surgery

## 2017-06-22 DIAGNOSIS — F429 Obsessive-compulsive disorder, unspecified: Secondary | ICD-10-CM | POA: Insufficient documentation

## 2017-06-22 DIAGNOSIS — Z888 Allergy status to other drugs, medicaments and biological substances status: Secondary | ICD-10-CM | POA: Insufficient documentation

## 2017-06-22 DIAGNOSIS — F209 Schizophrenia, unspecified: Secondary | ICD-10-CM | POA: Diagnosis not present

## 2017-06-22 DIAGNOSIS — G629 Polyneuropathy, unspecified: Secondary | ICD-10-CM | POA: Insufficient documentation

## 2017-06-22 DIAGNOSIS — J439 Emphysema, unspecified: Secondary | ICD-10-CM | POA: Insufficient documentation

## 2017-06-22 DIAGNOSIS — Z79899 Other long term (current) drug therapy: Secondary | ICD-10-CM | POA: Insufficient documentation

## 2017-06-22 DIAGNOSIS — K219 Gastro-esophageal reflux disease without esophagitis: Secondary | ICD-10-CM | POA: Insufficient documentation

## 2017-06-22 DIAGNOSIS — M5116 Intervertebral disc disorders with radiculopathy, lumbar region: Principal | ICD-10-CM | POA: Insufficient documentation

## 2017-06-22 DIAGNOSIS — Z9889 Other specified postprocedural states: Secondary | ICD-10-CM

## 2017-06-22 DIAGNOSIS — Z8249 Family history of ischemic heart disease and other diseases of the circulatory system: Secondary | ICD-10-CM | POA: Diagnosis not present

## 2017-06-22 DIAGNOSIS — F17210 Nicotine dependence, cigarettes, uncomplicated: Secondary | ICD-10-CM | POA: Diagnosis not present

## 2017-06-22 DIAGNOSIS — Z7982 Long term (current) use of aspirin: Secondary | ICD-10-CM | POA: Insufficient documentation

## 2017-06-22 DIAGNOSIS — J449 Chronic obstructive pulmonary disease, unspecified: Secondary | ICD-10-CM | POA: Diagnosis not present

## 2017-06-22 DIAGNOSIS — M5126 Other intervertebral disc displacement, lumbar region: Secondary | ICD-10-CM | POA: Diagnosis not present

## 2017-06-22 DIAGNOSIS — G2581 Restless legs syndrome: Secondary | ICD-10-CM | POA: Insufficient documentation

## 2017-06-22 DIAGNOSIS — F419 Anxiety disorder, unspecified: Secondary | ICD-10-CM | POA: Diagnosis not present

## 2017-06-22 DIAGNOSIS — I2510 Atherosclerotic heart disease of native coronary artery without angina pectoris: Secondary | ICD-10-CM | POA: Diagnosis not present

## 2017-06-22 DIAGNOSIS — Z981 Arthrodesis status: Secondary | ICD-10-CM | POA: Diagnosis not present

## 2017-06-22 HISTORY — PX: LUMBAR LAMINECTOMY/DECOMPRESSION MICRODISCECTOMY: SHX5026

## 2017-06-22 SURGERY — LUMBAR LAMINECTOMY/DECOMPRESSION MICRODISCECTOMY 1 LEVEL
Anesthesia: General | Site: Back | Laterality: Left

## 2017-06-22 MED ORDER — FENTANYL CITRATE (PF) 250 MCG/5ML IJ SOLN
INTRAMUSCULAR | Status: DC | PRN
  Administered 2017-06-22: 50 ug via INTRAVENOUS
  Administered 2017-06-22: 100 ug via INTRAVENOUS
  Administered 2017-06-22: 50 ug via INTRAVENOUS
  Administered 2017-06-22: 100 ug via INTRAVENOUS

## 2017-06-22 MED ORDER — THROMBIN 5000 UNITS EX SOLR
OROMUCOSAL | Status: DC | PRN
  Administered 2017-06-22: 12:00:00 via TOPICAL

## 2017-06-22 MED ORDER — CELECOXIB 200 MG PO CAPS
200.0000 mg | ORAL_CAPSULE | Freq: Two times a day (BID) | ORAL | Status: DC
  Administered 2017-06-22 – 2017-06-23 (×2): 200 mg via ORAL
  Filled 2017-06-22 (×2): qty 1

## 2017-06-22 MED ORDER — METHOCARBAMOL 500 MG PO TABS: 500.0000 mg | ORAL_TABLET | Freq: Four times a day (QID) | ORAL | Status: DC | PRN

## 2017-06-22 MED ORDER — METHYLPREDNISOLONE ACETATE 80 MG/ML IJ SUSP
INTRAMUSCULAR | Status: AC
  Filled 2017-06-22: qty 1

## 2017-06-22 MED ORDER — HYDROMORPHONE HCL 2 MG/ML IJ SOLN: 0.3000 mg | INTRAMUSCULAR | Status: DC | PRN

## 2017-06-22 MED ORDER — SUGAMMADEX SODIUM 200 MG/2ML IV SOLN
INTRAVENOUS | Status: AC
  Filled 2017-06-22: qty 2

## 2017-06-22 MED ORDER — LACTATED RINGERS IV SOLN
INTRAVENOUS | Status: DC
Start: 2017-06-22 — End: 2017-06-22
  Administered 2017-06-22: 12:00:00 via INTRAVENOUS

## 2017-06-22 MED ORDER — METHYLPREDNISOLONE ACETATE 80 MG/ML IJ SUSP
INTRAMUSCULAR | Status: DC | PRN
  Administered 2017-06-22: 80 mg

## 2017-06-22 MED ORDER — SODIUM CHLORIDE 0.9 % IV SOLN
INTRAVENOUS | Status: DC | PRN
  Administered 2017-06-22: 12:00:00

## 2017-06-22 MED ORDER — ONDANSETRON HCL 4 MG/2ML IJ SOLN: 4.0000 mg | Freq: Four times a day (QID) | INTRAMUSCULAR | Status: DC | PRN

## 2017-06-22 MED ORDER — PROMETHAZINE HCL 25 MG/ML IJ SOLN: 6.2500 mg | INTRAMUSCULAR | Status: DC | PRN

## 2017-06-22 MED ORDER — SUGAMMADEX SODIUM 200 MG/2ML IV SOLN
INTRAVENOUS | Status: DC | PRN
  Administered 2017-06-22: 200 mg via INTRAVENOUS

## 2017-06-22 MED ORDER — CEFAZOLIN SODIUM-DEXTROSE 2-4 GM/100ML-% IV SOLN
2.0000 g | Freq: Three times a day (TID) | INTRAVENOUS | Status: AC
  Administered 2017-06-22 – 2017-06-23 (×2): 2 g via INTRAVENOUS
  Filled 2017-06-22 (×2): qty 100

## 2017-06-22 MED ORDER — DIVALPROEX SODIUM ER 500 MG PO TB24
1000.0000 mg | ORAL_TABLET | Freq: Every day | ORAL | Status: DC
  Administered 2017-06-22: 1000 mg via ORAL
  Filled 2017-06-22: qty 2

## 2017-06-22 MED ORDER — KETOROLAC TROMETHAMINE 15 MG/ML IJ SOLN
15.0000 mg | Freq: Once | INTRAMUSCULAR | Status: AC
  Administered 2017-06-22: 15 mg via INTRAVENOUS
  Filled 2017-06-22: qty 1

## 2017-06-22 MED ORDER — ONDANSETRON HCL 4 MG/2ML IJ SOLN
INTRAMUSCULAR | Status: AC
  Filled 2017-06-22: qty 2

## 2017-06-22 MED ORDER — LIDOCAINE 2% (20 MG/ML) 5 ML SYRINGE
INTRAMUSCULAR | Status: DC | PRN
Start: 2017-06-22 — End: 2017-06-22
  Administered 2017-06-22: 100 mg via INTRAVENOUS

## 2017-06-22 MED ORDER — PROPOFOL 10 MG/ML IV BOLUS
INTRAVENOUS | Status: AC
  Filled 2017-06-22: qty 20

## 2017-06-22 MED ORDER — ACETAMINOPHEN 650 MG RE SUPP: 650.0000 mg | RECTAL | Status: DC | PRN

## 2017-06-22 MED ORDER — DEXAMETHASONE SODIUM PHOSPHATE 10 MG/ML IJ SOLN
INTRAMUSCULAR | Status: AC
  Filled 2017-06-22: qty 1

## 2017-06-22 MED ORDER — CEFAZOLIN SODIUM-DEXTROSE 2-3 GM-%(50ML) IV SOLR
INTRAVENOUS | Status: DC | PRN
  Administered 2017-06-22: 2 g via INTRAVENOUS

## 2017-06-22 MED ORDER — CEFAZOLIN SODIUM-DEXTROSE 2-4 GM/100ML-% IV SOLN
INTRAVENOUS | Status: AC
  Filled 2017-06-22: qty 100

## 2017-06-22 MED ORDER — FENTANYL CITRATE (PF) 250 MCG/5ML IJ SOLN
INTRAMUSCULAR | Status: AC
  Filled 2017-06-22: qty 5

## 2017-06-22 MED ORDER — ACETAMINOPHEN 325 MG PO TABS: 650.0000 mg | ORAL_TABLET | ORAL | Status: DC | PRN

## 2017-06-22 MED ORDER — BUPIVACAINE HCL (PF) 0.25 % IJ SOLN
INTRAMUSCULAR | Status: AC
  Filled 2017-06-22: qty 30

## 2017-06-22 MED ORDER — HYDROCODONE-ACETAMINOPHEN 5-325 MG PO TABS: 1.0000 | ORAL_TABLET | ORAL | Status: DC | PRN

## 2017-06-22 MED ORDER — ARTIFICIAL TEARS OPHTHALMIC OINT
TOPICAL_OINTMENT | OPHTHALMIC | Status: DC | PRN
  Administered 2017-06-22: 1 via OPHTHALMIC

## 2017-06-22 MED ORDER — SODIUM CHLORIDE 0.9% FLUSH: 3.0000 mL | INTRAVENOUS | Status: DC | PRN

## 2017-06-22 MED ORDER — DEXAMETHASONE SODIUM PHOSPHATE 10 MG/ML IJ SOLN
INTRAMUSCULAR | Status: DC | PRN
  Administered 2017-06-22: 10 mg via INTRAVENOUS

## 2017-06-22 MED ORDER — 0.9 % SODIUM CHLORIDE (POUR BTL) OPTIME
TOPICAL | Status: DC | PRN
  Administered 2017-06-22: 1000 mL

## 2017-06-22 MED ORDER — SODIUM CHLORIDE 0.9% FLUSH
3.0000 mL | Freq: Two times a day (BID) | INTRAVENOUS | Status: DC
  Administered 2017-06-22: 3 mL via INTRAVENOUS

## 2017-06-22 MED ORDER — ONDANSETRON HCL 4 MG PO TABS: 4.0000 mg | ORAL_TABLET | Freq: Four times a day (QID) | ORAL | Status: DC | PRN

## 2017-06-22 MED ORDER — THROMBIN 5000 UNITS EX SOLR
CUTANEOUS | Status: AC
  Filled 2017-06-22: qty 5000

## 2017-06-22 MED ORDER — LACTATED RINGERS IV SOLN
INTRAVENOUS | Status: DC | PRN
  Administered 2017-06-22 (×2): via INTRAVENOUS

## 2017-06-22 MED ORDER — PROPOFOL 10 MG/ML IV BOLUS
INTRAVENOUS | Status: DC | PRN
  Administered 2017-06-22: 120 mg via INTRAVENOUS
  Administered 2017-06-22: 40 mg via INTRAVENOUS

## 2017-06-22 MED ORDER — METHOCARBAMOL 1000 MG/10ML IJ SOLN
500.0000 mg | Freq: Four times a day (QID) | INTRAMUSCULAR | Status: DC | PRN
  Filled 2017-06-22: qty 5

## 2017-06-22 MED ORDER — LIDOCAINE 2% (20 MG/ML) 5 ML SYRINGE
INTRAMUSCULAR | Status: AC
  Filled 2017-06-22: qty 5

## 2017-06-22 MED ORDER — ARTIFICIAL TEARS OPHTHALMIC OINT
TOPICAL_OINTMENT | OPHTHALMIC | Status: AC
  Filled 2017-06-22: qty 3.5

## 2017-06-22 MED ORDER — SENNA 8.6 MG PO TABS
1.0000 | ORAL_TABLET | Freq: Two times a day (BID) | ORAL | Status: DC
  Administered 2017-06-22 – 2017-06-23 (×2): 8.6 mg via ORAL
  Filled 2017-06-22 (×2): qty 1

## 2017-06-22 MED ORDER — ROCURONIUM BROMIDE 10 MG/ML (PF) SYRINGE
PREFILLED_SYRINGE | INTRAVENOUS | Status: AC
  Filled 2017-06-22: qty 10

## 2017-06-22 MED ORDER — POTASSIUM CHLORIDE IN NACL 20-0.9 MEQ/L-% IV SOLN: INTRAVENOUS | Status: DC

## 2017-06-22 MED ORDER — ONDANSETRON HCL 4 MG/2ML IJ SOLN
INTRAMUSCULAR | Status: DC | PRN
  Administered 2017-06-22: 4 mg via INTRAVENOUS

## 2017-06-22 MED ORDER — ROCURONIUM BROMIDE 10 MG/ML (PF) SYRINGE
PREFILLED_SYRINGE | INTRAVENOUS | Status: DC | PRN
  Administered 2017-06-22: 20 mg via INTRAVENOUS
  Administered 2017-06-22: 40 mg via INTRAVENOUS

## 2017-06-22 MED ORDER — ZIPRASIDONE HCL 40 MG PO CAPS
40.0000 mg | ORAL_CAPSULE | Freq: Every day | ORAL | Status: DC
  Administered 2017-06-22: 40 mg via ORAL
  Filled 2017-06-22: qty 1

## 2017-06-22 MED ORDER — MENTHOL 3 MG MT LOZG: 1.0000 | LOZENGE | OROMUCOSAL | Status: DC | PRN

## 2017-06-22 MED ORDER — BUPIVACAINE HCL (PF) 0.25 % IJ SOLN
INTRAMUSCULAR | Status: DC | PRN
  Administered 2017-06-22: 6 mL

## 2017-06-22 MED ORDER — MEPERIDINE HCL 50 MG/ML IJ SOLN: 6.2500 mg | INTRAMUSCULAR | Status: DC | PRN

## 2017-06-22 MED ORDER — HYDROMORPHONE HCL 1 MG/ML IJ SOLN: 0.5000 mg | INTRAMUSCULAR | Status: DC | PRN

## 2017-06-22 MED ORDER — PHENOL 1.4 % MT LIQD: 1.0000 | OROMUCOSAL | Status: DC | PRN

## 2017-06-22 SURGICAL SUPPLY — 49 items
ADH SKN CLS APL DERMABOND .7 (GAUZE/BANDAGES/DRESSINGS) ×1
APL SKNCLS STERI-STRIP NONHPOA (GAUZE/BANDAGES/DRESSINGS) ×1
BAG DECANTER FOR FLEXI CONT (MISCELLANEOUS) ×2 IMPLANT
BENZOIN TINCTURE PRP APPL 2/3 (GAUZE/BANDAGES/DRESSINGS) ×2 IMPLANT
BUR MATCHSTICK NEURO 3.0 LAGG (BURR) ×2 IMPLANT
CANISTER SUCT 3000ML PPV (MISCELLANEOUS) ×2 IMPLANT
CARTRIDGE OIL MAESTRO DRILL (MISCELLANEOUS) ×1 IMPLANT
DERMABOND ADVANCED (GAUZE/BANDAGES/DRESSINGS) ×1
DERMABOND ADVANCED .7 DNX12 (GAUZE/BANDAGES/DRESSINGS) IMPLANT
DIFFUSER DRILL AIR PNEUMATIC (MISCELLANEOUS) ×2 IMPLANT
DRAPE LAPAROTOMY 100X72X124 (DRAPES) ×2 IMPLANT
DRAPE MICROSCOPE LEICA (MISCELLANEOUS) ×2 IMPLANT
DRAPE POUCH INSTRU U-SHP 10X18 (DRAPES) ×2 IMPLANT
DRAPE SURG 17X23 STRL (DRAPES) ×2 IMPLANT
DRSG OPSITE POSTOP 3X4 (GAUZE/BANDAGES/DRESSINGS) ×1 IMPLANT
DURAPREP 26ML APPLICATOR (WOUND CARE) ×2 IMPLANT
ELECT REM PT RETURN 9FT ADLT (ELECTROSURGICAL) ×2
ELECTRODE REM PT RTRN 9FT ADLT (ELECTROSURGICAL) ×1 IMPLANT
GAUZE SPONGE 4X4 16PLY XRAY LF (GAUZE/BANDAGES/DRESSINGS) IMPLANT
GLOVE BIO SURGEON STRL SZ7 (GLOVE) IMPLANT
GLOVE BIO SURGEON STRL SZ8 (GLOVE) ×2 IMPLANT
GLOVE BIOGEL PI IND STRL 7.0 (GLOVE) IMPLANT
GLOVE BIOGEL PI INDICATOR 7.0 (GLOVE)
GOWN STRL REUS W/ TWL LRG LVL3 (GOWN DISPOSABLE) IMPLANT
GOWN STRL REUS W/ TWL XL LVL3 (GOWN DISPOSABLE) ×1 IMPLANT
GOWN STRL REUS W/TWL 2XL LVL3 (GOWN DISPOSABLE) IMPLANT
GOWN STRL REUS W/TWL LRG LVL3 (GOWN DISPOSABLE)
GOWN STRL REUS W/TWL XL LVL3 (GOWN DISPOSABLE) ×2
HEMOSTAT POWDER KIT SURGIFOAM (HEMOSTASIS) IMPLANT
KIT BASIN OR (CUSTOM PROCEDURE TRAY) ×2 IMPLANT
KIT TURNOVER KIT B (KITS) ×2 IMPLANT
NDL HYPO 25X1 1.5 SAFETY (NEEDLE) ×1 IMPLANT
NDL SPNL 20GX3.5 QUINCKE YW (NEEDLE) IMPLANT
NEEDLE HYPO 25X1 1.5 SAFETY (NEEDLE) ×2 IMPLANT
NEEDLE SPNL 20GX3.5 QUINCKE YW (NEEDLE) IMPLANT
NS IRRIG 1000ML POUR BTL (IV SOLUTION) ×2 IMPLANT
OIL CARTRIDGE MAESTRO DRILL (MISCELLANEOUS) ×2
PACK LAMINECTOMY NEURO (CUSTOM PROCEDURE TRAY) ×2 IMPLANT
PAD ARMBOARD 7.5X6 YLW CONV (MISCELLANEOUS) ×6 IMPLANT
RUBBERBAND STERILE (MISCELLANEOUS) ×4 IMPLANT
SPONGE SURGIFOAM ABS GEL SZ50 (HEMOSTASIS) IMPLANT
STRIP CLOSURE SKIN 1/2X4 (GAUZE/BANDAGES/DRESSINGS) ×2 IMPLANT
SUT VIC AB 0 CT1 18XCR BRD8 (SUTURE) ×1 IMPLANT
SUT VIC AB 0 CT1 8-18 (SUTURE) ×2
SUT VIC AB 2-0 CP2 18 (SUTURE) ×2 IMPLANT
SUT VIC AB 3-0 SH 8-18 (SUTURE) ×2 IMPLANT
TOWEL GREEN STERILE (TOWEL DISPOSABLE) ×2 IMPLANT
TOWEL GREEN STERILE FF (TOWEL DISPOSABLE) ×2 IMPLANT
WATER STERILE IRR 1000ML POUR (IV SOLUTION) ×2 IMPLANT

## 2017-06-22 NOTE — Transfer of Care (Signed)
Immediate Anesthesia Transfer of Care Note  Patient: [name]  Procedure(s) Performed: Extraforaminal Microdiscectomy  - L4-L5 - left (Left Back)  Patient Location: PACU  Anesthesia Type:General  Level of Consciousness: awake, alert  and oriented  Airway & Oxygen Therapy: Patient Spontanous Breathing and Patient connected to face mask oxygen  Post-op Assessment: Report given to RN and Post -op Vital signs reviewed and stable  Post vital signs: Reviewed and stable  Last Vitals:  Vitals Value Taken Time  BP 152/85 06/22/2017  2:37 PM  Temp    Pulse 77 06/22/2017  2:38 PM  Resp 18 06/22/2017  2:38 PM  SpO2 99 % 06/22/2017  2:38 PM  Vitals shown include unvalidated device data.  Last Pain:  Vitals:   06/22/17 1151  PainSc: 0-No pain      Patients Stated Pain Goal: 3 (17/00/17 4944)  Complications: No apparent anesthesia complications

## 2017-06-22 NOTE — Plan of Care (Signed)
  Problem: Activity: Goal: Ability to avoid complications of mobility impairment will improve Outcome: Progressing Goal: Ability to tolerate increased activity will improve Outcome: Progressing Goal: Will remain free from falls Outcome: Progressing   Problem: Education: Goal: Ability to verbalize activity precautions or restrictions will improve Outcome: Progressing Goal: Knowledge of the prescribed therapeutic regimen will improve Outcome: Progressing Goal: Understanding of discharge needs will improve Outcome: Progressing   Problem: Physical Regulation: Goal: Ability to maintain clinical measurements within normal limits will improve Outcome: Progressing Goal: Postoperative complications will be avoided or minimized Outcome: Progressing Goal: Diagnostic test results will improve Outcome: Progressing   Problem: Pain Management: Goal: Pain level will decrease Outcome: Progressing   Problem: Health Behavior/Discharge Planning: Goal: Identification of resources available to assist in meeting health care needs will improve Outcome: Progressing

## 2017-06-22 NOTE — Anesthesia Postprocedure Evaluation (Signed)
Anesthesia Post Note  Patient: SLOANE JUNKIN  Procedure(s) Performed: Extraforaminal Microdiscectomy  - L4-L5 - left (Left Back)     Patient location during evaluation: PACU Anesthesia Type: General Level of consciousness: awake and oriented Pain management: pain level controlled Vital Signs Assessment: post-procedure vital signs reviewed and stable Respiratory status: spontaneous breathing, nonlabored ventilation, respiratory function stable and patient connected to nasal cannula oxygen Cardiovascular status: blood pressure returned to baseline and stable Postop Assessment: no apparent nausea or vomiting Anesthetic complications: no    Last Vitals:  Vitals:   06/22/17 1513 06/22/17 1536  BP: (!) 141/85 (!) 142/73  Pulse: 68 73  Resp: 16 18  Temp: 36.7 C 37.1 C  SpO2: 97% 98%    Last Pain:  Vitals:   06/22/17 1536  TempSrc: Oral  PainSc:                  Seriyah Collison,JAMES TERRILL

## 2017-06-22 NOTE — Anesthesia Procedure Notes (Signed)
Procedure Name: Intubation Date/Time: 06/22/2017 1:16 PM Performed by: Teressa Lower., CRNA Pre-anesthesia Checklist: Patient identified, Emergency Drugs available, Suction available and Patient being monitored Patient Re-evaluated:Patient Re-evaluated prior to induction Oxygen Delivery Method: Circle system utilized Preoxygenation: Pre-oxygenation with 100% oxygen Induction Type: IV induction Ventilation: Mask ventilation without difficulty and Oral airway inserted - appropriate to patient size Laryngoscope Size: Mac and 3 Grade View: Grade I Tube type: Oral Tube size: 7.5 mm Number of attempts: 1 Airway Equipment and Method: Stylet and Oral airway Placement Confirmation: ETT inserted through vocal cords under direct vision,  positive ETCO2 and breath sounds checked- equal and bilateral Secured at: 21 cm Tube secured with: Tape Dental Injury: Teeth and Oropharynx as per pre-operative assessment

## 2017-06-22 NOTE — H&P (Signed)
Subjective: Patient is a 72 y.o. male admitted for L leg pain. Onset of symptoms was several months ago, gradually worsening since that time.  The pain is rated severe, and is located at the across the lower back and radiates to L leg. The pain is described as aching and occurs all day. The symptoms have been progressive. Symptoms are exacerbated by exercise. MRI or CT showed HNP l4-5 L   Past Medical History:  Diagnosis Date  . Emphysema   . Episodic confusion    Possible bipolar disorder  . GERD (gastroesophageal reflux disease)   . History of chicken pox   . HNP (herniated nucleus pulposus), lumbar   . Memory deficit 07/31/2012  . OCD (obsessive compulsive disorder)   . Peripheral neuropathy   . Restless leg syndrome 01/10/2003  . Unspecified psychosis 03/01/2012    Past Surgical History:  Procedure Laterality Date  . liver excision     for non-cancerous mass  . LUNG SURGERY  2005   Lipoma Removal    Prior to Admission medications   Medication Sig Start Date End Date Taking? Authorizing Provider  aspirin 325 MG tablet Take 325 mg by mouth at bedtime.    Yes [provider]  Coenzyme Q10 (COQ10) 100 MG CAPS Take 100 mg by mouth at bedtime.   Yes [provider]  divalproex (DEPAKOTE ER) 500 MG 24 hr tablet Take 1,000 mg by mouth at bedtime.    Yes [provider]  HYDROcodone-acetaminophen (NORCO/VICODIN) 5-325 MG tablet Take 1 tablet by mouth daily as needed for pain. 04/23/17  Yes [provider]  omeprazole (PRILOSEC) 40 MG capsule TAKE 1 CAPSULE BY MOUTH EVERY DAY Patient taking differently: TAKE 1 CAPSULE BY MOUTH EVERY DAY AT NIGHT 12/18/16  Yes Birdie Sons, MD  simvastatin (ZOCOR) 40 MG tablet Take 1 tablet (40 mg total) by mouth daily at 6 PM. 12/22/16  Yes Fisher, Kirstie Peri, MD  ziprasidone (GEODON) 40 MG capsule Take 40 mg by mouth at bedtime.   Yes [provider]   Allergies  Allergen Reactions  . Ropinirole Hcl Other  (See Comments)    Confusion, agiitation, disorientation, Hallucinations    Social History   Tobacco Use  . Smoking status: Current Every Day Smoker    Packs/day: 1.00    Years: 50.00    Pack years: 50.00    Types: Cigarettes  . Smokeless tobacco: Never Used  Substance Use Topics  . Alcohol use: No    Family History  Problem Relation Age of Onset  . Heart disease Mother   . Cancer Father   . Heart disease Father   . Diabetes Sister   . Heart disease Sister   . Seizures Paternal Grandfather      Review of Systems  Positive ROS: neg  All other systems have been reviewed and were otherwise negative with the exception of those mentioned in the HPI and as above.  Objective: Vital signs in last 24 hours: Temp:  [98.4 F (36.9 C)] 98.4 F (36.9 C) (06/14 1137) Pulse Rate:  [71] 71 (06/14 1137) Resp:  [20] 20 (06/14 1137) BP: (137)/(70) 137/70 (06/14 1137) SpO2:  [99 %] 99 % (06/14 1137) Weight:  [70.8 kg (156 lb)] 70.8 kg (156 lb) (06/14 1137)  General Appearance: Alert, cooperative, no distress, appears stated age Head: Normocephalic, without obvious abnormality, atraumatic Eyes: PERRL, conjunctiva/corneas clear, EOM's intact    Neck: Supple, symmetrical, trachea midline Back: Symmetric, no curvature, ROM normal, no  CVA tenderness Lungs:  respirations unlabored Heart: Regular rate and rhythm Abdomen: Soft, non-tender Extremities: Extremities normal, atraumatic, no cyanosis or edema Pulses: 2+ and symmetric all extremities Skin: Skin color, texture, turgor normal, no rashes or lesions  NEUROLOGIC:   Mental status: Alert and oriented x4,  no aphasia, good attention span, fund of knowledge, and memory Motor Exam - grossly normal Sensory Exam - grossly normal Reflexes: 1+ Coordination - grossly normal Gait - grossly normal Balance - grossly normal Cranial Nerves: I: smell Not tested  II: visual acuity  OS: nl    OD: nl  II: visual fields Full to confrontation   II: pupils Equal, round, reactive to light  III,VII: ptosis None  III,IV,VI: extraocular muscles  Full ROM  V: mastication Normal  V: facial light touch sensation  Normal  V,VII: corneal reflex  Present  VII: facial muscle function - upper  Normal  VII: facial muscle function - lower Normal  VIII: hearing Not tested  IX: soft palate elevation  Normal  IX,X: gag reflex Present  XI: trapezius strength  5/5  XI: sternocleidomastoid strength 5/5  XI: neck flexion strength  5/5  XII: tongue strength  Normal    Data Review Lab Results  Component Value Date   WBC 5.9 06/19/2017   HGB 14.8 06/19/2017   HCT 43.8 06/19/2017   MCV 98.9 06/19/2017   PLT 171 06/19/2017   Lab Results  Component Value Date   NA 138 06/19/2017   K 4.4 06/19/2017   CL 102 06/19/2017   CO2 26 06/19/2017   BUN 11 06/19/2017   CREATININE 1.10 06/19/2017   GLUCOSE 151 (H) 06/19/2017   Lab Results  Component Value Date   INR 1.06 09/03/2015    Assessment/Plan:  Estimated body mass index is 22.38 kg/m as calculated from the following:   Height as of this encounter: 5\' 10"  (1.778 m).   Weight as of this encounter: 70.8 kg (156 lb). Patient admitted for L L4-5 extraforaminal microdiskectomy. Patient has failed a reasonable attempt at conservative therapy.  I explained the condition and procedure to the patient and answered any questions.  Patient wishes to proceed with procedure as planned. Understands risks/ benefits and typical outcomes of procedure.   Ataya Murdy S 06/22/2017 12:37 PM

## 2017-06-22 NOTE — Op Note (Signed)
06/22/2017  2:23 PM  PATIENT:  [name]  72 y.o. male  PRE-OPERATIVE DIAGNOSIS:  L4-5 extraforaminal disc herniation with left L4 radiculopathy  POST-OPERATIVE DIAGNOSIS:  same  PROCEDURE:  Left L4-5 extraforaminal decompression and microdiscectomy  SURGEON:  Sherley Bounds, MD  ASSISTANTS: Margo Aye FNP  ANESTHESIA:   General  EBL: 50 ml  Total I/O In: 1000 [I.V.:1000] Out: 50 [Blood:50]  BLOOD ADMINISTERED: none  DRAINS: none  SPECIMEN:  none  INDICATION FOR PROCEDURE: This patient presented with severe left leg pain. Imaging showed a large left L4-5 extraforaminal disc herniation compressing the left L4 nerve root. The patient tried conservative measures without relief. Pain was debilitating. Recommended left L4-5 extraforaminal microdiscectomy. Patient understood the risks, benefits, and alternatives and potential outcomes and wished to proceed.  PROCEDURE DETAILS: The patient was taken to the operating room and after induction of adequate generalized endotracheal anesthesia, the patient was rolled into the prone position on the Wilson frame and all pressure points were padded. The lumbar region was cleaned and then prepped with DuraPrep and draped in the usual sterile fashion. 5 cc of local anesthesia was injected and then a dorsal midline incision was made and carried down to the lumbo sacral fascia. The fascia was opened and the paraspinous musculature was taken down in a subperiosteal fashion to expose L4-5 on the left. Intraoperative x-ray confirmed my level, and then I used a combination of the high-speed drill and the Kerrison punches to remove the lateral part of the pars and superior part of the facet and perform an extraforaminal foraminotomy at L4-5 on the left. The underlying yellow ligament was opened and removed in a piecemeal fashion to expose the exiting nerve root. I undercut the lateral recess and dissected down until I was medial to and distal to the L4  pedicle. The nerve root was well decompressed. We then gently retracted the nerve root superiorly with a retractor, coagulated the epidural venous vasculature, and found a large extraforaminal herniated disc with multiple free fragments. These were removed with a nerve hook and a pituitary rongeur. These were in the axilla the nerve root and under the nerve root and quite a bit lateral. Iincised the disc space. I performed a thorough intradiscal discectomy with pituitary rongeurs and curettes, until I had a nice decompression of the nerve root. Was found further small free fragment in the lateral recess on the left and removed this also. I then palpated with a coronary dilator along the nerve root and into the foramen to assure adequate decompression. I felt no more compression of the nerve root. I irrigated with saline solution containing bacitracin. Achieved hemostasis with bipolar cautery, lined the dura with Gelfoam, and then closed the fascia with 0 Vicryl. I closed the subcutaneous tissues with 2-0 Vicryl and the subcuticular tissues with 3-0 Vicryl. The skin was then closed with benzoin and Steri-Strips. The drapes were removed, a sterile dressing was applied. The patient was awakened from general anesthesia and transferred to the recovery room in stable condition. At the end of the procedure all sponge, needle and instrument counts were correct.    PLAN OF CARE: Admit for overnight observation  PATIENT DISPOSITION:  PACU - hemodynamically stable.   Delay start of Pharmacological VTE agent (>24hrs) due to surgical blood loss or risk of bleeding:  yes

## 2017-06-22 NOTE — Anesthesia Preprocedure Evaluation (Signed)
Anesthesia Evaluation  Patient identified by MRN, date of birth, ID band Patient awake    Reviewed: Allergy & Precautions, NPO status , Patient's Chart, lab work & pertinent test results  Airway Mallampati: I       Dental  (+) Edentulous Upper, Edentulous Lower   Pulmonary Current Smoker,    Pulmonary exam normal breath sounds clear to auscultation       Cardiovascular Normal cardiovascular exam Rhythm:Regular Rate:Normal     Neuro/Psych PSYCHIATRIC DISORDERS Anxiety Depression Bipolar Disorder Schizophrenia    GI/Hepatic Neg liver ROS, GERD  Medicated,  Endo/Other    Renal/GU negative Renal ROS  negative genitourinary   Musculoskeletal   Abdominal Normal abdominal exam  (+)   Peds  Hematology negative hematology ROS (+)   Anesthesia Other Findings   Reproductive/Obstetrics                             Anesthesia Physical Anesthesia Plan  ASA: II  Anesthesia Plan: General   Post-op Pain Management:    Induction: Intravenous  PONV Risk Score and Plan: 2 and Ondansetron and Dexamethasone  Airway Management Planned: Oral ETT  Additional Equipment:   Intra-op Plan:   Post-operative Plan: Extubation in OR  Informed Consent: I have reviewed the patients History and Physical, chart, labs and discussed the procedure including the risks, benefits and alternatives for the proposed anesthesia with the patient or authorized representative who has indicated his/her understanding and acceptance.   Dental advisory given  Plan Discussed with:   Anesthesia Plan Comments:         Anesthesia Quick Evaluation

## 2017-06-23 ENCOUNTER — Encounter (HOSPITAL_COMMUNITY): Payer: Self-pay | Admitting: Neurological Surgery

## 2017-06-23 DIAGNOSIS — M5116 Intervertebral disc disorders with radiculopathy, lumbar region: Secondary | ICD-10-CM | POA: Diagnosis not present

## 2017-06-23 NOTE — Progress Notes (Signed)
Patient alert and oriented, mae's well, voiding adequate amount of urine, swallowing without difficulty, no c/o pain at time of discharge. Patient discharged home with family. Script and discharged instructions given to patient. Patient and family stated understanding of instructions given. Patient has an appointment with Dr. Jones °

## 2017-06-23 NOTE — Progress Notes (Signed)
OT Note - Addendum    06/23/17 1100  OT Visit Information  Last OT Received On 06/23/17  OT Time Calculation  OT Start Time (ACUTE ONLY) 0932  OT Stop Time (ACUTE ONLY) 1003  OT Time Calculation (min) 31 min  OT General Charges  $OT Visit 1 Visit  OT Evaluation  $OT Eval Low Complexity 1 Low  OT Treatments  $Self Care/Home Management  8-22 mins  Maurie Boettcher, OT/L  OT Clinical Specialist [phone]

## 2017-06-23 NOTE — Discharge Summary (Signed)
Physician Discharge Summary  Patient ID: [name] MRN: [id number] DOB/AGE: [birth year] 72 y.o.  Admit date: 06/22/2017 Discharge date: 06/23/2017  Admission Diagnoses: L4-5 HNP    Discharge Diagnoses: same   Discharged Condition: good  Hospital Course: The patient was admitted on 06/22/2017 and taken to the operating room where the patient underwent L4-5 extraforaminal microdiskectomy. The patient tolerated the procedure well and was taken to the recovery room and then to the floor in stable condition. The hospital course was routine. There were no complications. The wound remained clean dry and intact. Pt had appropriate back soreness. No complaints of leg pain or new N/T/W. The patient remained afebrile with stable vital signs, and tolerated a regular diet. The patient continued to increase activities, and pain was well controlled with oral pain medications.   Consults: None  Significant Diagnostic Studies:  Results for orders placed or performed during the hospital encounter of 06/19/17  Surgical pcr screen  Result Value Ref Range   MRSA, PCR NEGATIVE NEGATIVE   Staphylococcus aureus NEGATIVE NEGATIVE  Basic metabolic panel  Result Value Ref Range   Sodium 138 135 - 145 mmol/L   Potassium 4.4 3.5 - 5.1 mmol/L   Chloride 102 101 - 111 mmol/L   CO2 26 22 - 32 mmol/L   Glucose, Bld 151 (H) 65 - 99 mg/dL   BUN 11 6 - 20 mg/dL   Creatinine, Ser 1.10 0.61 - 1.24 mg/dL   Calcium 9.5 8.9 - 10.3 mg/dL   GFR calc non Af Amer >60 >60 mL/min   GFR calc Af Amer >60 >60 mL/min   Anion gap 10 5 - 15  CBC  Result Value Ref Range   WBC 5.9 4.0 - 10.5 K/uL   RBC 4.43 4.22 - 5.81 MIL/uL   Hemoglobin 14.8 13.0 - 17.0 g/dL   HCT 43.8 39.0 - 52.0 %   MCV 98.9 78.0 - 100.0 fL   MCH 33.4 26.0 - 34.0 pg   MCHC 33.8 30.0 - 36.0 g/dL   RDW 13.3 11.5 - 15.5 %   Platelets 171 150 - 400 K/uL    Dg Lumbar Spine 1 View  Result Date: 06/22/2017 CLINICAL DATA:  Left L4-5 microdiscectomy.  EXAM: LUMBAR SPINE - 1 VIEW COMPARISON:  MRI 04/19/2017 FINDINGS: Single image shows a probe between the spinous processes of L4 and L5 directed towards the L4-5 disc level. IMPRESSION: L4-5 localized. Electronically Signed   By: Nelson Chimes M.D.   On: 06/22/2017 13:39    Antibiotics:  Anti-infectives (From admission, onward)   Start     Dose/Rate Route Frequency Ordered Stop   06/22/17 2200  ceFAZolin (ANCEF) IVPB 2g/100 mL premix     2 g 200 mL/hr over 30 Minutes Intravenous Every 8 hours 06/22/17 1549 06/23/17 0536   06/22/17 1217  bacitracin 50,000 Units in sodium chloride 0.9 % 500 mL irrigation  Status:  Discontinued       As needed 06/22/17 1217 06/22/17 1431   06/22/17 1149  ceFAZolin (ANCEF) 2-4 GM/100ML-% IVPB    Note to Pharmacy:  Jasmine Pang   : cabinet override      06/22/17 1149 06/22/17 2359      Discharge Exam: Blood pressure 130/74, pulse (!) 56, temperature 97.6 F (36.4 C), temperature source Oral, resp. rate 18, height 5\' 10"  (1.778 m), weight 70.8 kg (156 lb), SpO2 99 %. Neurologic: Grossly normal Dressing dry  Discharge Medications:   Allergies as of 06/23/2017  Reactions   Ropinirole Hcl Other (See Comments)   Confusion, agiitation, disorientation, Hallucinations      Medication List    TAKE these medications   aspirin 325 MG tablet Take 325 mg by mouth at bedtime.   CoQ10 100 MG Caps Take 100 mg by mouth at bedtime.   divalproex 500 MG 24 hr tablet Commonly known as:  DEPAKOTE ER Take 1,000 mg by mouth at bedtime.   HYDROcodone-acetaminophen 5-325 MG tablet Commonly known as:  NORCO/VICODIN Take 1 tablet by mouth daily as needed for pain.   omeprazole 40 MG capsule Commonly known as:  PRILOSEC TAKE 1 CAPSULE BY MOUTH EVERY DAY What changed:    how much to take  how to take this  when to take this   simvastatin 40 MG tablet Commonly known as:  ZOCOR Take 1 tablet (40 mg total) by mouth daily at 6 PM.   ziprasidone 40 MG  capsule Commonly known as:  GEODON Take 40 mg by mouth at bedtime.       Disposition: home   Final Dx: L4-5 extraforaminal microdiskectomy  Discharge Instructions     Remove dressing in 72 hours   Complete by:  As directed    Call MD for:  difficulty breathing, headache or visual disturbances   Complete by:  As directed    Call MD for:  persistant nausea and vomiting   Complete by:  As directed    Call MD for:  redness, tenderness, or signs of infection (pain, swelling, redness, odor or green/yellow discharge around incision site)   Complete by:  As directed    Call MD for:  severe uncontrolled pain   Complete by:  As directed    Call MD for:  temperature >100.4   Complete by:  As directed    Diet - low sodium heart healthy   Complete by:  As directed    Increase activity slowly   Complete by:  As directed          Signed: Vella Colquitt S 06/23/2017, 7:59 AM

## 2017-06-23 NOTE — Evaluation (Signed)
Physical Therapy Evaluation Patient Details Name: [name] MRN: [id number] DOB: [birth year] Today's Date: 06/23/2017   History of Present Illness  Pt is a 72 y/o male who presents s/p L4-L5 lami/microdiscectomy on 06/22/17. PMH significant for restless leg syndrome, peripheral neuropathy, emphysema.  Clinical Impression  Pt admitted with above diagnosis. Pt currently with functional limitations due to the deficits listed below (see PT Problem List). At the time of PT eval pt was able to perform transfers and ambulation with gross min guard assist to min assist for balance support and safety. Pt reports he can borrow a SPC from sister, however if it is no longer available, he will need one prior to d/c. Pt will benefit from skilled PT to increase their independence and safety with mobility to allow discharge to the venue listed below.       Follow Up Recommendations No PT follow up;Supervision for mobility/OOB    Equipment Recommendations  3in1 (PT)    Recommendations for Other Services       Precautions / Restrictions Precautions Precautions: Fall;Back Precaution Booklet Issued: Yes (comment) Precaution Comments: Reviewed handout with pt and he was cued for maintenance of precautions during functional mobility.  Restrictions Weight Bearing Restrictions: No      Mobility  Bed Mobility Overal bed mobility: Needs Assistance Bed Mobility: Rolling;Sidelying to Sit Rolling: Modified independent (Device/Increase time) Sidelying to sit: Supervision       General bed mobility comments: VC's and supervision for proper log roll technique.   Transfers Overall transfer level: Needs assistance Equipment used: None Transfers: Sit to/from Stand Sit to Stand: Min assist         General transfer comment: VC's for hand placement on seated surface for safety. Assist for balance support as pt powered up to full stand.   Ambulation/Gait Ambulation/Gait assistance: Min assist;Min  guard Gait Distance (Feet): 300 Feet Assistive device: None;Straight cane Gait Pattern/deviations: Step-through pattern;Decreased stride length;Trunk flexed Gait velocity: Decreased Gait velocity interpretation: 1.31 - 2.62 ft/sec, indicative of limited community ambulator General Gait Details: Unsteady requiring assist for balance support without an AD. With [facility] pt was mainly at a min guard level with occasional assist required.   Stairs Stairs: (Pt declined practicing stairs as he only as 1)          Wheelchair Mobility    Modified Rankin (Stroke Patients Only)       Balance Overall balance assessment: Needs assistance Sitting-balance support: Feet supported;No upper extremity supported Sitting balance-Leahy Scale: Fair     Standing balance support: No upper extremity supported;During functional activity Standing balance-Leahy Scale: Poor                               Pertinent Vitals/Pain Pain Assessment: Faces Faces Pain Scale: Hurts little more Pain Location: Incision site Pain Descriptors / Indicators: Operative site guarding;Discomfort Pain Intervention(s): Monitored during session    Home Living Family/patient expects to be discharged to:: Private residence Living Arrangements: Other relatives Available Help at Discharge: Family;Available 24 hours/day(Initially) Type of Home: House Home Access: Stairs to enter   [employer] of Steps: 1 Home Layout: One level Home Equipment: Cane - single point;Walker - 2 wheels      Prior Function Level of Independence: Independent               Hand Dominance        Extremity/Trunk Assessment   Upper Extremity Assessment Upper Extremity Assessment:  Defer to OT evaluation    Lower Extremity Assessment Lower Extremity Assessment: Generalized weakness    Cervical / Trunk Assessment Cervical / Trunk Assessment: Other exceptions Cervical / Trunk Exceptions: s/p surgery   Communication   Communication: No difficulties  Cognition Arousal/Alertness: Awake/alert Behavior During Therapy: WFL for tasks assessed/performed Overall Cognitive Status: Within Functional Limits for tasks assessed                                        General Comments      Exercises     Assessment/Plan    PT Assessment Patient needs continued PT services  PT Problem List Decreased strength;Decreased activity tolerance;Decreased range of motion;Decreased balance;Decreased mobility;Decreased knowledge of use of DME;Decreased safety awareness;Decreased knowledge of precautions;Pain       PT Treatment Interventions DME instruction;Gait training;Stair training;Functional mobility training;Therapeutic activities;Therapeutic exercise;Neuromuscular re-education;Patient/family education    PT Goals (Current goals can be found in the Care Plan section)  Acute Rehab PT Goals Patient Stated Goal: Home today PT Goal Formulation: With patient Time For Goal Achievement: 06/30/17 Potential to Achieve Goals: Good    Frequency Min 5X/week   Barriers to discharge        Co-evaluation               AM-PAC PT "6 Clicks" Daily Activity  Outcome Measure Difficulty turning over in bed (including adjusting bedclothes, sheets and blankets)?: None Difficulty moving from lying on back to sitting on the side of the bed? : A Little Difficulty sitting down on and standing up from a chair with arms (e.g., wheelchair, bedside commode, etc,.)?: A Little Help needed moving to and from a bed to chair (including a wheelchair)?: A Little Help needed walking in hospital room?: A Little Help needed climbing 3-5 steps with a railing? : A Little 6 Click Score: 19    End of Session Equipment Utilized During Treatment: Gait belt Activity Tolerance: Patient tolerated treatment well Patient left: in chair;with call bell/phone within reach Nurse Communication: Mobility status PT  Visit Diagnosis: Unsteadiness on feet (R26.81);Pain;Difficulty in walking, not elsewhere classified (R26.2) Pain - part of body: (Incision site - back)    Time: 1007-1219 PT Time Calculation (min) (ACUTE ONLY): 16 min   Charges:   PT Evaluation $PT Eval Moderate Complexity: 1 Mod     PT G Codes:        Rolinda Roan, PT, DPT Acute Rehabilitation Services Pager: [phone]   Thelma Comp 06/23/2017, 8:46 AM

## 2017-06-23 NOTE — Progress Notes (Signed)
Occupational Therapy Evaluation Patient Details Name: [name] MRN: [id number] DOB: 1945-06-04 Today's Date: 06/23/2017    History of Present Illness Pt is a 72 y/o male who presents s/p L4-L5 lami/microdiscectomy on 06/22/17. PMH significant for restless leg syndrome, peripheral neuropathy, emphysema.   Clinical Impression   PTA, pt was living at home alone and was independent with ADLs. Pt currently min guard for ADLs and functional mobility for consistent multimodal cues to follow precautions. Pt reports sister and niece will be available following d/c, providing 24/7 S initially. Feel pt will progress to baseline with S from caregiver for OOB ADLs and functional mobility to adhere to back precautions. Educated nursing on need to review precautions with family and to page OT if needed for family education for safe D/C home.    Follow Up Recommendations  No OT follow up;Supervision - Intermittent;Other (comment)(S with OOB activity/LB dressing)    Equipment Recommendations  3 in 1 bedside commode    Recommendations for Other Services       Precautions / Restrictions Precautions Precautions: Fall;Back Precaution Booklet Issued: Yes (comment) Precaution Comments: Reviewed handout with pt and he was cued for maintenance of precautions during functional mobility.  Restrictions Weight Bearing Restrictions: No      Mobility Bed Mobility Overal bed mobility: Needs Assistance Bed Mobility: Rolling;Sidelying to Sit Rolling: Supervision Sidelying to sit: Supervision       General bed mobility comments: VC for proper technique  Transfers Overall transfer level: Needs assistance Equipment used: None Transfers: Sit to/from Stand Sit to Stand: Min guard         General transfer comment: VC's for hand placement on seated surface for safety and adherence to precautions    Balance Overall balance assessment: Mild deficits observed, not formally tested Sitting-balance  support: Feet supported;No upper extremity supported Sitting balance-Leahy Scale: Good     Standing balance support: No upper extremity supported;During functional activity Standing balance-Leahy Scale: Fair Standing balance comment: min furniture walking                           ADL either performed or assessed with clinical judgement   ADL Overall ADL's : Needs assistance/impaired Eating/Feeding: Set up   Grooming: Min guard;Standing Grooming Details (indicate cue type and reason): educated on using environmental setup/compensatory strategies to adhere to back precautions; Upper Body Bathing: Supervision/ safety;Cueing for compensatory techniques;Sitting   Lower Body Bathing: Min guard;Sit to/from stand;Cueing for back precautions   Upper Body Dressing : Set up;Sitting   Lower Body Dressing: Min guard;Cueing for back precautions;Sit to/from stand;Cueing for sequencing Lower Body Dressing Details (indicate cue type and reason): VC to don LB prior to sit<>stand  Toilet Transfer: Min guard;Cueing for safety;Comfort height toilet;Grab bars Toilet Transfer Details (indicate cue type and reason): education on use of 3in1 over toilet  Toileting- Clothing Manipulation and Hygiene: Min guard;Cueing for back precautions;Sit to/from stand   Tub/ Shower Transfer: Min guard;Cueing for Immunologist Details (indicate cue type and reason): recommended using 3in1 in shower Functional mobility during ADLs: Min guard;Cueing for safety General ADL Comments: pt required consistent multimodal cues for adherence to precautions during ADLs, no physical assistance required for ADLs;educated on compensatory strategies to adhere to precuautions     Vision Baseline Vision/History: Wears glasses Wears Glasses: At all times       Perception     Praxis      Pertinent Vitals/Pain Pain Assessment: Faces Faces Pain  Scale: Hurts a little bit Pain Location: Incision site Pain  Descriptors / Indicators: Operative site guarding;Discomfort Pain Intervention(s): Monitored during session;Limited activity within patient's tolerance     Hand Dominance Right   Extremity/Trunk Assessment Upper Extremity Assessment Upper Extremity Assessment: Generalized weakness   Lower Extremity Assessment Lower Extremity Assessment: Generalized weakness;LLE deficits/detail LLE Deficits / Details: reports increased weakness in LLE prior to admission;foot drop LLE Coordination: decreased gross motor;decreased fine motor   Cervical / Trunk Assessment Cervical / Trunk Assessment: Other exceptions Cervical / Trunk Exceptions: s/p surgery   Communication Communication Communication: No difficulties   Cognition Arousal/Alertness: Awake/alert Behavior During Therapy: WFL for tasks assessed/performed Overall Cognitive Status: Impaired/Different from baseline                                 General Comments: pt appeared to have difficulty with safety/judgement,short-term memory, and problem solving   General Comments  pt impulsive at times, required multimodal cues/repetition to adhere to back precautions;pt educated on importance of environmental setup/compensatory strategies to adhere to back precautions;pt educated on use of reacher/DME;educated pt on strategies to decrease risk of falls    Exercises     Shoulder Instructions      Home Living Family/patient expects to be discharged to:: Private residence Living Arrangements: Other relatives Available Help at Discharge: Family;Available 24 hours/day(Initially) Type of Home: House Home Access: Stairs to enter [employer] of Steps: 1   Home Layout: One level     Bathroom Shower/Tub: Occupational psychologist: Standard     Home Equipment: Cane - single point;Walker - 2 wheels   Additional Comments: used towel as bath mat      Prior Functioning/Environment Level of Independence:  Independent        Comments: no previous falls        OT Problem List: Decreased strength;Decreased range of motion;Decreased activity tolerance;Impaired balance (sitting and/or standing);Decreased safety awareness;Pain;Decreased knowledge of precautions;Decreased knowledge of use of DME or AE      OT Treatment/Interventions:      OT Goals(Current goals can be found in the care plan section) Acute Rehab OT Goals Patient Stated Goal: Home today OT Goal Formulation: With patient Time For Goal Achievement: 07/07/17 Potential to Achieve Goals: Good  OT Frequency:     Barriers to D/C:            Co-evaluation              AM-PAC PT "6 Clicks" Daily Activity     Outcome Measure Help from another person eating meals?: None Help from another person taking care of personal grooming?: A Little Help from another person toileting, which includes using toliet, bedpan, or urinal?: A Little Help from another person bathing (including washing, rinsing, drying)?: A Little Help from another person to put on and taking off regular upper body clothing?: A Little Help from another person to put on and taking off regular lower body clothing?: A Little 6 Click Score: 19   End of Session Equipment Utilized During Treatment: Gait belt Nurse Communication: Mobility status  Activity Tolerance: Patient tolerated treatment well Patient left: in chair;with call bell/phone within reach  OT Visit Diagnosis: Unsteadiness on feet (R26.81);Other abnormalities of gait and mobility (R26.89);Muscle weakness (generalized) (M62.81)                Time:  -    Charges:    G-Codes:  Dorinda Hill OTS    06/23/2017, 10:49 AM

## 2017-07-06 ENCOUNTER — Other Ambulatory Visit: Payer: Self-pay | Admitting: Family Medicine

## 2017-08-03 NOTE — Progress Notes (Signed)
Patient: [name], Male    DOB: 1945/02/02, 72 y.o.   MRN: [id number] Visit Date: 08/06/2017  Today's Provider: Lelon Huh, MD   Chief Complaint  Patient presents with  . Medicare Wellness   Subjective:    Annual physical exam [name] is a 72 y.o. male who presents today for health maintenance and complete physical. He feels well. He reports exercising some. He reports he is sleeping well. He is concerned about losing weight over the last several months. He did have surgery for herniated disk in June which he has recuperate from and is doing well. No change in appetite. No fevers, chills, sweats, coughing, nausea, of abdominal pains.   -----------------------------------------------------------------   Cognitive Testing - 6-CIT  Correct? Score   What year is it? yes 0 0 or 4  What month is it? yes 0 0 or 3  Memorize:    [name],  42,  [address],  [place],      What time is it? (within 1 hour) yes 0 0 or 3  Count backwards from 20 yes 0 0, 2, or 4  Name the months of the year yes 2 0, 2, or 4  Repeat name & address above yes 3 0, 2, 4, 6, 8, or 10       TOTAL SCORE  5/28   Interpretation:  Normal                     Normal (0-7) Abnormal (8-28)     Review of Systems  Constitutional: Positive for unexpected weight change.  HENT: Negative.   Eyes: Negative.   Respiratory: Negative.   Cardiovascular: Negative.   Gastrointestinal: Negative.   Endocrine: Negative.   Genitourinary: Negative.   Musculoskeletal: Positive for back pain.  Skin: Negative.   Allergic/Immunologic: Negative.   Neurological: Negative.   Hematological: Negative.   Psychiatric/Behavioral: Negative.     Social History      He  reports that he has been smoking cigarettes.  He has a 50.00 pack-year smoking history. He has never used smokeless tobacco. He reports that he does not drink alcohol or use drugs.       Social History   Socioeconomic History  . Marital  status: Single    Spouse name: Not on file  . Number of children: 0  . Years of education: 41  . Highest education level: Not on file  Occupational History  . Occupation: Retired  Scientific laboratory technician  . Financial resource strain: Not on file  . Food insecurity:    Worry: Not on file    Inability: Not on file  . Transportation needs:    Medical: Not on file    Non-medical: Not on file  Tobacco Use  . Smoking status: Current Every Day Smoker    Packs/day: 1.00    Years: 50.00    Pack years: 50.00    Types: Cigarettes  . Smokeless tobacco: Never Used  Substance and Sexual Activity  . Alcohol use: No  . Drug use: No  . Sexual activity: Not on file  Lifestyle  . Physical activity:    Days per week: Not on file    Minutes per session: Not on file  . Stress: Not on file  Relationships  . Social connections:    Talks on phone: Not on file    Gets together: Not on file    Attends religious service: Not on file  Active member of club or organization: Not on file    Attends meetings of clubs or organizations: Not on file    Relationship status: Not on file  Other Topics Concern  . Not on file  Social History Narrative   Patient lives at home with wife.    Patient is retired.    Patient has no children.    Patient has 11th grade education.     Past Medical History:  Diagnosis Date  . Emphysema   . Episodic confusion    Possible bipolar disorder  . GERD (gastroesophageal reflux disease)   . History of chicken pox   . HNP (herniated nucleus pulposus), lumbar   . Memory deficit 07/31/2012  . OCD (obsessive compulsive disorder)   . Peripheral neuropathy   . Restless leg syndrome 01/10/2003  . Unspecified psychosis 03/01/2012     Patient Active Problem List   Diagnosis Date Noted  . Bipolar disorder ([place]) 08/06/2017  . S/P lumbar laminectomy 06/22/2017  . Coronary atherosclerosis 01/29/2017  . Lung nodule seen on imaging study 01/29/2017  . Maculopathy 12/28/2016  .  Patent foramen ovale   . Abscess of upper lobe of left lung without pneumonia ([place]) 09/14/2015  . Smoking greater than 30 pack years 07/19/2015  . Subclinical hypothyroidism 07/17/2014  . Anxiety 07/08/2014  . Dyspnea on exertion 07/08/2014  . Memory deficit 07/31/2012  . Cerebrovascular disease, unspecified 03/01/2012  . Restless legs syndrome (RLS) 03/01/2012  . Unspecified hereditary and idiopathic peripheral neuropathy 03/01/2012  . COPD (chronic obstructive pulmonary disease) ([place]) 06/14/2010  . BPH (benign prostatic hyperplasia) 06/08/2009  . Late effects of cerebrovascular disease 07/19/2008  . Bipolar I disorder, single manic episode, moderate ([place]) 07/14/2008  . Fam hx-ischem heart disease 07/16/2006  . Hyperlipidemia 01/10/2003  . Herpesviral infection of perianal skin and rectum 01/10/2000    Past Surgical History:  Procedure Laterality Date  . liver excision     for non-cancerous mass  . LUMBAR LAMINECTOMY/DECOMPRESSION MICRODISCECTOMY Left 06/22/2017   Procedure: Extraforaminal Microdiscectomy  - L4-L5 - left;  Surgeon: Eustace Moore, MD;  Location: [facility];  Service: Neurosurgery;  Laterality: Left;  . LUNG SURGERY  2005   Lipoma Removal    Family History        Family Status  Relation Name Status  . Mother  Deceased at age 51       MI  . Father  Deceased at age 23       from cancer (unknown type)  . Sister  Alive  . Sister  Alive  . Sister  Alive  . Other [employer]  . PGF  (Not Specified)        His family history includes Cancer in his father; Diabetes in his sister; Heart disease in his father, mother, and sister; Seizures in his paternal grandfather.      Allergies  Allergen Reactions  . Ropinirole Hcl Other (See Comments)    Confusion, agiitation, disorientation, Hallucinations     Current Outpatient Medications:  .  aspirin 325 MG tablet, Take 325 mg by mouth at bedtime. , Disp: , Rfl:  .  benztropine (COGENTIN) 0.5 MG tablet, TAKE 1 TABLET  BY MOUTH DAILY WITH DINNER, Disp: , Rfl: 1 .  Coenzyme Q10 (COQ10) 100 MG CAPS, Take 100 mg by mouth at bedtime., Disp: , Rfl:  .  divalproex (DEPAKOTE ER) 500 MG 24 hr tablet, Take 1,000 mg by mouth at bedtime. , Disp: , Rfl:  .  HYDROcodone-acetaminophen (NORCO/VICODIN) 5-325 MG tablet, Take 1 tablet by mouth daily as needed for pain., Disp: , Rfl: 0 .  omeprazole (PRILOSEC) 40 MG capsule, TAKE 1 CAPSULE BY MOUTH EVERY DAY, Disp: 90 capsule, Rfl: 0 .  simvastatin (ZOCOR) 40 MG tablet, Take 1 tablet (40 mg total) by mouth daily at 6 PM., Disp: 90 tablet, Rfl: 3 .  ziprasidone (GEODON) 40 MG capsule, Take 40 mg by mouth at bedtime., Disp: , Rfl:    Patient Care Team: Birdie Sons, MD as PCP - General (Family Medicine) Kathrynn Ducking, MD as Consulting Physician (Neurology) Chauncey Mann, MD as Referring Physician (Psychiatry) Vladimir Crofts, MD (Neurology)      Objective:   Vitals: BP 102/62 (BP Location: Left Arm, Patient Position: Sitting, Cuff Size: Normal)   Pulse (!) 56   Temp 97.8 F (36.6 C) (Oral)   Ht 5\' 10"  (1.778 m)   Wt 159 lb 12.8 oz (72.5 kg)   SpO2 99%   BMI 22.93 kg/m    Vitals:   08/06/17 0859  BP: 102/62  Pulse: (!) 56  Temp: 97.8 F (36.6 C)  TempSrc: Oral  SpO2: 99%  Weight: 159 lb 12.8 oz (72.5 kg)  Height: 5\' 10"  (1.778 m)     Wt Readings from Last 6 Encounters:  08/06/17 159 lb 12.8 oz (72.5 kg)  06/22/17 156 lb (70.8 kg)  06/19/17 156 lb 3.2 oz (70.9 kg)  01/25/17 172 lb (78 kg)  11/23/16 177 lb (80.3 kg)  07/19/16 177 lb (80.3 kg)     Physical Exam   General Appearance:    Alert, cooperative, no distress, appears stated age  Head:    Normocephalic, without obvious abnormality, atraumatic  Eyes:    PERRL, conjunctiva/corneas clear, EOM's intact, fundi    benign, both eyes       Ears:    Normal TM's and external ear canals, both ears  Nose:   Nares normal, septum midline, mucosa normal, no drainage   or sinus tenderness  Throat:    Lips, mucosa, and tongue normal; teeth and gums normal  Neck:   Supple, symmetrical, trachea midline, no adenopathy;       thyroid:  No enlargement/tenderness/nodules; no carotid   bruit or JVD  Back:     Symmetric, no curvature, ROM normal, no CVA tenderness  Lungs:     Clear to auscultation bilaterally, respirations unlabored  Chest wall:    No tenderness or deformity  Heart:    Regular rate and rhythm, S1 and S2 normal, no murmur, rub   or gallop  Abdomen:     Soft, non-tender, bowel sounds active all four quadrants,    no masses, no organomegaly  Genitalia:    deferred  Rectal:    deferred  Extremities:   Extremities normal, atraumatic, no cyanosis or edema  Pulses:   2+ and symmetric all extremities  Skin:   Skin color, texture, turgor normal, no rashes or lesions  Lymph nodes:   Cervical, supraclavicular, and axillary nodes normal  Neurologic:   CNII-XII intact. Normal strength, sensation and reflexes      throughout    Depression Screen PHQ 2/9 Scores 08/06/2017 07/19/2016 07/19/2015 07/16/2014  PHQ - 2 Score 0 0 0 1  PHQ- 9 Score 3 0 - -      Assessment & Plan:     Routine Health Maintenance and Physical Exam  Exercise Activities and Dietary recommendations Goals    None  Immunization History  Administered Date(s) Administered  . Influenza, High Dose Seasonal PF 10/08/2015, 10/31/2016  . Pneumococcal Conjugate-13 07/14/2013  . Pneumococcal Polysaccharide-23 07/04/2011  . Tdap 06/04/2008  . Zoster 06/08/2009    Health Maintenance  Topic Date Due  . INFLUENZA VACCINE  08/09/2017  . TETANUS/TDAP  06/05/2018  . COLONOSCOPY  08/08/2020  . Hepatitis C Screening  Completed  . PNA vac Low Risk Adult  Completed     Discussed health benefits of physical activity, and encouraged him to engage in regular exercise appropriate for his age and condition.    -------------------------------------------------------------------- 1. Annual physical exam Normal  exam.   2. Hypercholesterolemia He is tolerating simvastatin well with no adverse effects.   - Comprehensive metabolic panel - Lipid panel  3. Subclinical hypothyroidism Check TSH  4. Prostate cancer screening  - PSA  5. Cerebrovascular disease, unspecified  - aspirin 81 MG tablet; Take 1 tablet (81 mg total) by mouth daily.  6. Weight loss  - Hemoglobin A1c - TSH    Lelon Huh, MD  [facility]

## 2017-08-06 ENCOUNTER — Ambulatory Visit (INDEPENDENT_AMBULATORY_CARE_PROVIDER_SITE_OTHER): Payer: PPO | Admitting: Family Medicine

## 2017-08-06 ENCOUNTER — Encounter: Payer: Self-pay | Admitting: Family Medicine

## 2017-08-06 VITALS — BP 102/62 | HR 56 | Temp 97.8°F | Ht 70.0 in | Wt 159.8 lb

## 2017-08-06 DIAGNOSIS — E7800 Pure hypercholesterolemia, unspecified: Secondary | ICD-10-CM

## 2017-08-06 DIAGNOSIS — I679 Cerebrovascular disease, unspecified: Secondary | ICD-10-CM | POA: Diagnosis not present

## 2017-08-06 DIAGNOSIS — R634 Abnormal weight loss: Secondary | ICD-10-CM

## 2017-08-06 DIAGNOSIS — Z0000 Encounter for general adult medical examination without abnormal findings: Secondary | ICD-10-CM

## 2017-08-06 DIAGNOSIS — F319 Bipolar disorder, unspecified: Secondary | ICD-10-CM | POA: Insufficient documentation

## 2017-08-06 DIAGNOSIS — E039 Hypothyroidism, unspecified: Secondary | ICD-10-CM | POA: Diagnosis not present

## 2017-08-06 DIAGNOSIS — Z125 Encounter for screening for malignant neoplasm of prostate: Secondary | ICD-10-CM | POA: Diagnosis not present

## 2017-08-06 DIAGNOSIS — E038 Other specified hypothyroidism: Secondary | ICD-10-CM

## 2017-08-06 MED ORDER — ASPIRIN 81 MG PO TABS: 81.0000 mg | ORAL_TABLET | Freq: Every day | ORAL | Status: DC

## 2017-08-06 NOTE — Patient Instructions (Signed)
Change dose of daily aspirin to 81mg  once a day

## 2017-08-07 ENCOUNTER — Telehealth: Payer: Self-pay

## 2017-08-07 LAB — COMPREHENSIVE METABOLIC PANEL
ALT: 10 IU/L (ref 0–44)
AST: 19 IU/L (ref 0–40)
Albumin/Globulin Ratio: 2.1 (ref 1.2–2.2)
Albumin: 4.1 g/dL (ref 3.5–4.8)
Alkaline Phosphatase: 58 IU/L (ref 39–117)
BUN/Creatinine Ratio: 9 — ABNORMAL LOW (ref 10–24)
BUN: 9 mg/dL (ref 8–27)
Bilirubin Total: 0.4 mg/dL (ref 0.0–1.2)
CO2: 23 mmol/L (ref 20–29)
Calcium: 9.5 mg/dL (ref 8.6–10.2)
Chloride: 99 mmol/L (ref 96–106)
Creatinine, Ser: 0.96 mg/dL (ref 0.76–1.27)
GFR calc Af Amer: 91 mL/min/{1.73_m2} (ref >59)
GFR calc non Af Amer: 79 mL/min/{1.73_m2} (ref >59)
Globulin, Total: 2 g/dL (ref 1.5–4.5)
Glucose: 72 mg/dL (ref 65–99)
Potassium: 4.9 mmol/L (ref 3.5–5.2)
Sodium: 138 mmol/L (ref 134–144)
Total Protein: 6.1 g/dL (ref 6.0–8.5)

## 2017-08-07 LAB — LIPID PANEL
Chol/HDL Ratio: 2.2 ratio (ref 0.0–5.0)
Cholesterol, Total: 162 mg/dL (ref 100–199)
HDL: 73 mg/dL (ref >39)
LDL Calculated: 71 mg/dL (ref 0–99)
Triglycerides: 91 mg/dL (ref 0–149)
VLDL Cholesterol Cal: 18 mg/dL (ref 5–40)

## 2017-08-07 LAB — TSH: TSH: 3.49 u[IU]/mL (ref 0.450–4.500)

## 2017-08-07 LAB — PSA: Prostate Specific Ag, Serum: 1.5 ng/mL (ref 0.0–4.0)

## 2017-08-07 LAB — HEMOGLOBIN A1C
Est. average glucose Bld gHb Est-mCnc: 114 mg/dL
Hgb A1c MFr Bld: 5.6 % (ref 4.8–5.6)

## 2017-08-07 NOTE — Telephone Encounter (Signed)
-----   Message from Birdie Sons, MD sent at 08/07/2017 10:13 AM EDT ----- Labs are all completely normal. Cholesterol well controlled at 162. Continue current medications.  Check yearly.

## 2017-08-07 NOTE — Telephone Encounter (Signed)
LMTCB

## 2017-08-09 NOTE — Telephone Encounter (Signed)
Patient was notified of results. Expressed understanding.  

## 2017-09-07 DIAGNOSIS — F3172 Bipolar disorder, in full remission, most recent episode hypomanic: Secondary | ICD-10-CM | POA: Diagnosis not present

## 2017-09-14 DIAGNOSIS — F3172 Bipolar disorder, in full remission, most recent episode hypomanic: Secondary | ICD-10-CM | POA: Diagnosis not present

## 2017-09-24 DIAGNOSIS — M545 Low back pain: Secondary | ICD-10-CM | POA: Diagnosis not present

## 2017-09-24 DIAGNOSIS — M5126 Other intervertebral disc displacement, lumbar region: Secondary | ICD-10-CM | POA: Diagnosis not present

## 2017-09-27 DIAGNOSIS — M5126 Other intervertebral disc displacement, lumbar region: Secondary | ICD-10-CM | POA: Diagnosis not present

## 2017-09-27 DIAGNOSIS — M545 Low back pain: Secondary | ICD-10-CM | POA: Diagnosis not present

## 2017-10-01 ENCOUNTER — Other Ambulatory Visit: Payer: Self-pay | Admitting: Family Medicine

## 2017-10-02 DIAGNOSIS — M5126 Other intervertebral disc displacement, lumbar region: Secondary | ICD-10-CM | POA: Diagnosis not present

## 2017-10-02 DIAGNOSIS — M545 Low back pain: Secondary | ICD-10-CM | POA: Diagnosis not present

## 2017-10-05 DIAGNOSIS — M5126 Other intervertebral disc displacement, lumbar region: Secondary | ICD-10-CM | POA: Diagnosis not present

## 2017-10-05 DIAGNOSIS — M545 Low back pain: Secondary | ICD-10-CM | POA: Diagnosis not present

## 2017-10-08 DIAGNOSIS — F3172 Bipolar disorder, in full remission, most recent episode hypomanic: Secondary | ICD-10-CM | POA: Diagnosis not present

## 2017-10-09 DIAGNOSIS — M5126 Other intervertebral disc displacement, lumbar region: Secondary | ICD-10-CM | POA: Diagnosis not present

## 2017-10-09 DIAGNOSIS — M545 Low back pain: Secondary | ICD-10-CM | POA: Diagnosis not present

## 2017-10-11 DIAGNOSIS — M5126 Other intervertebral disc displacement, lumbar region: Secondary | ICD-10-CM | POA: Diagnosis not present

## 2017-10-11 DIAGNOSIS — M545 Low back pain: Secondary | ICD-10-CM | POA: Diagnosis not present

## 2017-10-17 DIAGNOSIS — M545 Low back pain: Secondary | ICD-10-CM | POA: Diagnosis not present

## 2017-10-17 DIAGNOSIS — M5126 Other intervertebral disc displacement, lumbar region: Secondary | ICD-10-CM | POA: Diagnosis not present

## 2017-10-18 DIAGNOSIS — M545 Low back pain: Secondary | ICD-10-CM | POA: Diagnosis not present

## 2017-10-18 DIAGNOSIS — M5126 Other intervertebral disc displacement, lumbar region: Secondary | ICD-10-CM | POA: Diagnosis not present

## 2017-10-23 DIAGNOSIS — M545 Low back pain: Secondary | ICD-10-CM | POA: Diagnosis not present

## 2017-10-23 DIAGNOSIS — M5126 Other intervertebral disc displacement, lumbar region: Secondary | ICD-10-CM | POA: Diagnosis not present

## 2017-10-25 DIAGNOSIS — M5126 Other intervertebral disc displacement, lumbar region: Secondary | ICD-10-CM | POA: Diagnosis not present

## 2017-10-25 DIAGNOSIS — M545 Low back pain: Secondary | ICD-10-CM | POA: Diagnosis not present

## 2017-10-30 DIAGNOSIS — M545 Low back pain: Secondary | ICD-10-CM | POA: Diagnosis not present

## 2017-10-30 DIAGNOSIS — M5126 Other intervertebral disc displacement, lumbar region: Secondary | ICD-10-CM | POA: Diagnosis not present

## 2017-10-31 DIAGNOSIS — M5126 Other intervertebral disc displacement, lumbar region: Secondary | ICD-10-CM | POA: Diagnosis not present

## 2017-10-31 DIAGNOSIS — M545 Low back pain: Secondary | ICD-10-CM | POA: Diagnosis not present

## 2017-11-05 DIAGNOSIS — M5126 Other intervertebral disc displacement, lumbar region: Secondary | ICD-10-CM | POA: Diagnosis not present

## 2017-11-07 ENCOUNTER — Other Ambulatory Visit: Payer: Self-pay | Admitting: Neurological Surgery

## 2017-11-07 DIAGNOSIS — M5126 Other intervertebral disc displacement, lumbar region: Secondary | ICD-10-CM

## 2017-11-13 DIAGNOSIS — H2513 Age-related nuclear cataract, bilateral: Secondary | ICD-10-CM | POA: Diagnosis not present

## 2017-11-13 DIAGNOSIS — H35363 Drusen (degenerative) of macula, bilateral: Secondary | ICD-10-CM | POA: Diagnosis not present

## 2017-11-13 DIAGNOSIS — H3554 Dystrophies primarily involving the retinal pigment epithelium: Secondary | ICD-10-CM | POA: Diagnosis not present

## 2017-11-13 DIAGNOSIS — H35723 Serous detachment of retinal pigment epithelium, bilateral: Secondary | ICD-10-CM | POA: Diagnosis not present

## 2017-11-25 ENCOUNTER — Ambulatory Visit
Admission: RE | Admit: 2017-11-25 | Discharge: 2017-11-25 | Disposition: A | Discharge status: Home / Self Care | Payer: PPO | Source: Ambulatory Visit | Attending: Neurological Surgery | Admitting: Neurological Surgery

## 2017-11-25 DIAGNOSIS — M5126 Other intervertebral disc displacement, lumbar region: Secondary | ICD-10-CM | POA: Insufficient documentation

## 2017-11-25 DIAGNOSIS — M47816 Spondylosis without myelopathy or radiculopathy, lumbar region: Secondary | ICD-10-CM | POA: Diagnosis not present

## 2017-11-25 LAB — POCT I-STAT CREATININE: Creatinine, Ser: 1 mg/dL (ref 0.61–1.24)

## 2017-11-25 MED ORDER — GADOBUTROL 1 MMOL/ML IV SOLN
7.0000 mL | Freq: Once | INTRAVENOUS | Status: AC | PRN
  Administered 2017-11-25: 7 mL via INTRAVENOUS

## 2017-12-04 DIAGNOSIS — R030 Elevated blood-pressure reading, without diagnosis of hypertension: Secondary | ICD-10-CM | POA: Diagnosis not present

## 2017-12-04 DIAGNOSIS — M5126 Other intervertebral disc displacement, lumbar region: Secondary | ICD-10-CM | POA: Diagnosis not present

## 2017-12-05 NOTE — Progress Notes (Signed)
* [facility]     Assessment and Plan:   COPD. --Minimally symptomatic at this time, his main treatment would be smoking cessation.  -No need for inhalers at this time. --Will start albuterol inhaler to use as needed.  --Flu shot completed this year. PCV 13 07/14/13; PPSV 07/04/11.  --Will refer to pulm rehab.  --Currently enrolled in lung cancer screening repeat low-dose CT in 3 months.   Nicotine abuse. -Discussed importance of smoking cessation.  Spent 3 minutes in discussion.  Lung abscess/lung nodule. -Status post CT guided biopsy on 09/03/15, consistent with infection, treated with antibiotics, no further radiological follow-up deemed necessary  Hiatal hernia/intrathoracic stomach. -This may be contributory to the patient's lung abscess. -Would therefore ensure the patient has anti-reflex measures such as PPI and dietary control,  not eating 4 hours before bedtime.  Meds ordered this encounter  Medications  . albuterol (PROAIR HFA) 108 (90 Base) MCG/ACT inhaler    Sig: Inhale 1-2 puffs into the lungs every 6 (six) hours as needed for wheezing or shortness of breath.    Dispense:  1 Inhaler    Refill:  5   Orders Placed This Encounter  Procedures  . CT CHEST LUNG CA SCREEN LOW DOSE W/O CM  . AMB referral to pulmonary rehabilitation   Return in about 4 months (around 04/11/2018).   Date: 12/05/2017  MRN# [id number] [name] 03-28-1945   [name] is a 72 y.o. old male seen in follow up for chief complaint of  Chief Complaint  Patient presents with  . Follow-up    COPD: Pulmonary Lesion: Pt states he is doing fine-denies any wheezing, SOB, or cough at rest. Slight SOB with exertion.      Synopsis: Patient had a left upper lobe spiculated lesion, status post CT biopsy on 09/03/15, consistent with abscess, no micro available.  Treated with Augmentin for 2 weeks.  Subsequent PET scan 01/13/16 minimally changed left upper lobe lesion with  calcification, minimally changed from 12/15/15, 08/18/15.[name]  No further CT follow-up was deemed necessary.  COPD with continued smoking.  Subjective: Since his last visit he feels that his breathing has been doing well, he does do an occasional treadmill but has been limited due to back issues. He does have some issues with breathing. He does not do any inhalers currently, he does not have issues with breathing at rest. He is minimally active and fairly sedentary. He has not been to pulmonary rehab.  He is smoking about a half a ppd, not really serious about quitting currently.    chest x-ray 07/19/16; hyperinflation consistent with emphysema, otherwise lungs are unremarkable.  Review of his pulmonary function test done at [facility] showed that he has a mild obstruction, FEV1/FVC 68%, FEV1 81%, DLCO 90%. His 6 minute walk test showed a total distance of 1014 feet, no desaturations, highest heart rate 74.    Medication:   Outpatient Encounter Medications as of 12/10/2017  Medication Sig  . aspirin 81 MG tablet Take 1 tablet (81 mg total) by mouth daily.  . benztropine (COGENTIN) 0.5 MG tablet TAKE 1 TABLET BY MOUTH DAILY WITH DINNER  . Coenzyme Q10 (COQ10) 100 MG CAPS Take 100 mg by mouth at bedtime.  . divalproex (DEPAKOTE ER) 500 MG 24 hr tablet Take 1,000 mg by mouth at bedtime.   [name] omeprazole (PRILOSEC) 40 MG capsule TAKE 1 CAPSULE BY MOUTH EVERY DAY  . simvastatin (ZOCOR) 40 MG tablet Take 1 tablet (40 mg  total) by mouth daily at 6 PM.  . ziprasidone (GEODON) 40 MG capsule Take 40 mg by mouth at bedtime.   No facility-administered encounter medications on file as of 12/10/2017.      Allergies:  Ropinirole hcl  Review of Systems:  Constitutional: Feels well. Cardiovascular: Denies chest pain, exertional chest pain.  Pulmonary: Denies hemoptysis, pleuritic chest pain.   The remainder of systems were reviewed and were found to be negative other than what is documented in the HPI.     Physical Examination:   VS: BP 122/80 (BP Location: Left Arm, Cuff Size: Normal)   Pulse 63   Ht 5\' 10"  (1.778 m)   Wt 168 lb (76.2 kg)   SpO2 100%   BMI 24.11 kg/m   General Appearance: No distress  Neuro:without focal findings, mental status, speech normal, alert and oriented HEENT: PERRLA, EOM intact Pulmonary: No wheezing, No rales  CardiovascularNormal S1,S2.  No m/r/g.  Abdomen: Benign, Soft, non-tender, No masses Renal:  No costovertebral tenderness  GU:  No performed at this time. Endoc: No evident thyromegaly, no signs of acromegaly or Cushing features Skin:   warm, no rashes, no ecchymosis  Extremities: normal, no cyanosis, clubbing.     LABORATORY PANEL:   CBC No results for input(s): WBC, HGB, HCT, PLT in the last 168 hours. ------------------------------------------------------------------------------------------------------------------  Chemistries  No results for input(s): NA, K, CL, CO2, GLUCOSE, BUN, CREATININE, CALCIUM, MG, AST, ALT, ALKPHOS, BILITOT in the last 168 hours.  Invalid input(s): GFRCGP ------------------------------------------------------------------------------------------------------------------  Cardiac Enzymes No results for input(s): TROPONINI in the last 168 hours. ------------------------------------------------------------  RADIOLOGY:   No results found for this or any previous visit. Results for orders placed during the hospital encounter of 10/11/15  DG Chest 2 View   Narrative CLINICAL DATA:  Recent lung biopsy.  EXAM: CHEST  2 VIEW  COMPARISON:  Chest radiograph September 03, 2015 ; PET-CT August 18, 2015  FINDINGS: No evident pneumothorax. There is postoperative change on the left. There is scarring in the left base. No mass is evident by radiography. No edema or consolidation is evident. Heart size and pulmonary vascularity are normal. No adenopathy is evident. There is a hiatal hernia. There are no blastic or  lytic bone lesions.  IMPRESSION: Postoperative change on the left with scarring left base. No mass or adenopathy evident. No edema or consolidation. Hiatal hernia present. Stable cardiac silhouette.   Electronically Signed   By: Lowella Grip III M.D.   On: 10/11/2015 09:12    ------------------------------------------------------------------------------------------------------------------  Thank  you for allowing [facility] to assist in the care of your patient. Our recommendations are noted above.  Please contact us if we can be of further service.  Marda Stalker, M.D., F.C.C.P.  Board Certified in Internal Medicine, Pulmonary Medicine, [place], and Sleep Medicine.  [facility] Office Number: [phone]   12/05/2017

## 2017-12-10 ENCOUNTER — Ambulatory Visit: Payer: PPO | Admitting: Internal Medicine

## 2017-12-10 ENCOUNTER — Encounter: Payer: Self-pay | Admitting: Internal Medicine

## 2017-12-10 VITALS — BP 122/80 | HR 63 | Ht 70.0 in | Wt 168.0 lb

## 2017-12-10 DIAGNOSIS — R0609 Other forms of dyspnea: Secondary | ICD-10-CM

## 2017-12-10 DIAGNOSIS — J449 Chronic obstructive pulmonary disease, unspecified: Secondary | ICD-10-CM

## 2017-12-10 DIAGNOSIS — R918 Other nonspecific abnormal finding of lung field: Secondary | ICD-10-CM | POA: Diagnosis not present

## 2017-12-10 DIAGNOSIS — F17210 Nicotine dependence, cigarettes, uncomplicated: Secondary | ICD-10-CM | POA: Diagnosis not present

## 2017-12-10 DIAGNOSIS — R0600 Dyspnea, unspecified: Secondary | ICD-10-CM

## 2017-12-10 MED ORDER — ALBUTEROL SULFATE HFA 108 (90 BASE) MCG/ACT IN AERS: 1.0000 | INHALATION_SPRAY | Freq: Four times a day (QID) | RESPIRATORY_TRACT | 5 refills | Status: DC | PRN

## 2017-12-10 NOTE — Patient Instructions (Addendum)
Will start proair inhaler to be used 2 puffs as needed. Take 2 puffs before activity that will make you winded.  Will refer you to pulmonary rehab.  --Quitting smoking is the most important thing that you can do for your health.  --Quitting smoking will have greater affect on your health than any medicine that we can give you.

## 2017-12-24 ENCOUNTER — Other Ambulatory Visit: Payer: Self-pay | Admitting: Family Medicine

## 2018-01-08 DIAGNOSIS — F3172 Bipolar disorder, in full remission, most recent episode hypomanic: Secondary | ICD-10-CM | POA: Diagnosis not present

## 2018-01-09 DIAGNOSIS — C801 Malignant (primary) neoplasm, unspecified: Secondary | ICD-10-CM

## 2018-01-09 HISTORY — DX: Malignant (primary) neoplasm, unspecified: C80.1

## 2018-01-21 ENCOUNTER — Other Ambulatory Visit: Payer: Self-pay | Admitting: Neurological Surgery

## 2018-01-21 DIAGNOSIS — M5126 Other intervertebral disc displacement, lumbar region: Secondary | ICD-10-CM | POA: Diagnosis not present

## 2018-01-26 NOTE — Telephone Encounter (Signed)
encounter opened in error, closed for administrative reasons.

## 2018-02-06 NOTE — Pre-Procedure Instructions (Signed)
GWYNN CROSSLEY  02/06/2018      [id number] - [address], [address] [place] [address] Phone: [phone] Fax: [phone]     Your procedure is scheduled on Monday, Feb. 10th   Report to [facility] Admitting at 8:45 AM             (posted surgery time 10:49a -2:43p)   Call this number if you have problems the morning of surgery:  [phone]   Remember:   Do not eat any foods or drink any liquids after midnight, Sunday.   5 days prior to surgery, STOP TAKING ANY Vitamins, Herbal Supplements, Anti-inflammatories, Blood Thinners.    Take these medicines the morning of surgery with A SIP OF WATER : Omeprazole     Do not wear jewelry - NO RINGS  Do not wear lotions, colognes or deodorant.             Men may shave face and neck.  Do not bring valuables to the hospital.  [facility] is not responsible for any belongings or valuables.  Contacts, dentures or bridgework may not be worn into surgery.  Leave your suitcase in the car.  After surgery it may be brought to your room.  For patients admitted to the hospital, discharge time will be determined by your treatment team.  Please read over the following fact sheets that you were given. Pain Booklet and Surgical Site Infection Prevention       [facility]- Preparing For Surgery  Before surgery, you can play an important role. Because skin is not sterile, your skin needs to be as free of germs as possible. You can reduce the number of germs on your skin by washing with CHG (chlorahexidine gluconate) Soap before surgery.  CHG is an antiseptic cleaner which kills germs and bonds with the skin to continue killing germs even after washing.    Oral Hygiene is also important to reduce your risk of infection.    Remember - BRUSH YOUR TEETH THE MORNING OF SURGERY WITH YOUR REGULAR TOOTHPASTE  Please do not use if you have an allergy to CHG or antibacterial soaps. If your  skin becomes reddened/irritated stop using the CHG.  Do not shave (including legs and underarms) for at least 48 hours prior to first CHG shower. It is OK to shave your face.  Please follow these instructions carefully.   1. Shower the NIGHT BEFORE SURGERY and the MORNING OF SURGERY with CHG.   2. If you chose to wash your hair, wash your hair first as usual with your normal shampoo.  3. After you shampoo, rinse your hair and body thoroughly to remove the shampoo.  4. Use CHG as you would any other liquid soap. You can apply CHG directly to the skin and wash gently with a scrungie or a clean washcloth.   5. Apply the CHG Soap to your body ONLY FROM THE NECK DOWN.  Do not use on open wounds or open sores. Avoid contact with your eyes, ears, mouth and genitals (private parts). Wash Face and genitals (private parts)  with your normal soap.  6. Wash thoroughly, paying special attention to the area where your surgery will be performed.  7. Thoroughly rinse your body with warm water from the neck down.  8. DO NOT shower/wash with your normal soap after using and rinsing off the CHG Soap.  9. Pat yourself dry with a CLEAN TOWEL.  10. Wear CLEAN PAJAMAS to bed the night before surgery, wear comfortable clothes the morning of surgery  11. Place CLEAN SHEETS on your bed the night of your first shower and DO NOT SLEEP WITH PETS.  Day of Surgery:  Do not apply any deodorants/lotions.  Please wear clean clothes to the hospital/surgery center.    Remember to brush your teeth WITH YOUR REGULAR TOOTHPASTE.

## 2018-02-07 ENCOUNTER — Encounter (HOSPITAL_COMMUNITY)
Admission: RE | Admit: 2018-02-07 | Discharge: 2018-02-07 | Disposition: A | Discharge status: Home / Self Care | Payer: PPO | Source: Ambulatory Visit | Attending: Neurological Surgery | Admitting: Neurological Surgery

## 2018-02-07 ENCOUNTER — Other Ambulatory Visit: Payer: Self-pay

## 2018-02-07 ENCOUNTER — Ambulatory Visit (HOSPITAL_COMMUNITY)
Admission: RE | Admit: 2018-02-07 | Discharge: 2018-02-07 | Disposition: A | Discharge status: Home / Self Care | Payer: PPO | Source: Ambulatory Visit | Attending: Neurological Surgery | Admitting: Neurological Surgery

## 2018-02-07 ENCOUNTER — Encounter (HOSPITAL_COMMUNITY): Payer: Self-pay

## 2018-02-07 DIAGNOSIS — M5126 Other intervertebral disc displacement, lumbar region: Secondary | ICD-10-CM

## 2018-02-07 DIAGNOSIS — K449 Diaphragmatic hernia without obstruction or gangrene: Secondary | ICD-10-CM | POA: Diagnosis not present

## 2018-02-07 HISTORY — DX: Unspecified osteoarthritis, unspecified site: M19.90

## 2018-02-07 LAB — CBC WITH DIFFERENTIAL/PLATELET
Abs Immature Granulocytes: 0.02 10*3/uL (ref 0.00–0.07)
Basophils Absolute: 0 10*3/uL (ref 0.0–0.1)
Basophils Relative: 1 %
Eosinophils Absolute: 0.1 10*3/uL (ref 0.0–0.5)
Eosinophils Relative: 1 %
HCT: 41.2 % (ref 39.0–52.0)
Hemoglobin: 13.7 g/dL (ref 13.0–17.0)
Immature Granulocytes: 0 %
Lymphocytes Relative: 26 %
Lymphs Abs: 1.6 10*3/uL (ref 0.7–4.0)
MCH: 32.9 pg (ref 26.0–34.0)
MCHC: 33.3 g/dL (ref 30.0–36.0)
MCV: 99 fL (ref 80.0–100.0)
Monocytes Absolute: 0.5 10*3/uL (ref 0.1–1.0)
Monocytes Relative: 8 %
Neutro Abs: 3.9 10*3/uL (ref 1.7–7.7)
Neutrophils Relative %: 64 %
Platelets: 154 10*3/uL (ref 150–400)
RBC: 4.16 MIL/uL — ABNORMAL LOW (ref 4.22–5.81)
RDW: 13.1 % (ref 11.5–15.5)
WBC: 6 10*3/uL (ref 4.0–10.5)
nRBC: 0 % (ref 0.0–0.2)

## 2018-02-07 LAB — SURGICAL PCR SCREEN
MRSA, PCR: NEGATIVE
Staphylococcus aureus: NEGATIVE

## 2018-02-07 LAB — TYPE AND SCREEN
ABO/RH(D): B POS
Antibody Screen: NEGATIVE

## 2018-02-07 LAB — PROTIME-INR
INR: 1.03
Prothrombin Time: 13.4 seconds (ref 11.4–15.2)

## 2018-02-07 LAB — BASIC METABOLIC PANEL
Anion gap: 7 (ref 5–15)
BUN: 15 mg/dL (ref 8–23)
CO2: 27 mmol/L (ref 22–32)
Calcium: 9.1 mg/dL (ref 8.9–10.3)
Chloride: 109 mmol/L (ref 98–111)
Creatinine, Ser: 1.16 mg/dL (ref 0.61–1.24)
GFR calc Af Amer: >60 mL/min (ref >60)
GFR calc non Af Amer: >60 mL/min (ref >60)
Glucose, Bld: 99 mg/dL (ref 70–99)
Potassium: 4.1 mmol/L (ref 3.5–5.1)
Sodium: 143 mmol/L (ref 135–145)

## 2018-02-07 LAB — ABO/RH: ABO/RH(D): B POS

## 2018-02-07 NOTE — Progress Notes (Signed)
PCP - Dr. Juanetta Beets   LOV 07/2017  Cardiologist - denies   Chest x-ray - 02/07/2018 EKG - 02/07/2018 Stress Test -  n/a ECHO - n/a Cardiac Cath - n/a  Sleep Study - >5 yrs ago  neg CPAP -   Fasting Blood Sugar - n/a Checks Blood Sugar _____ times a day  Blood Thinner Instructions:  Aspirin Instructions: will stop aspirin on the 5th of Feb  Anesthesia review: no  Patient denies shortness of breath, fever, cough and chest pain at PAT appointment   Patient verbalized understanding of instructions that were given to them at the PAT appointment. Patient was also instructed that they will need to review over the PAT instructions again at home before surgery.

## 2018-02-08 ENCOUNTER — Other Ambulatory Visit (HOSPITAL_COMMUNITY): Payer: PPO

## 2018-02-18 ENCOUNTER — Other Ambulatory Visit: Payer: Self-pay

## 2018-02-18 ENCOUNTER — Inpatient Hospital Stay (HOSPITAL_COMMUNITY): Payer: PPO | Admitting: Certified Registered Nurse Anesthetist

## 2018-02-18 ENCOUNTER — Inpatient Hospital Stay (HOSPITAL_COMMUNITY)
Admission: RE | Admit: 2018-02-18 | Discharge: 2018-02-19 | DRG: 455 | Disposition: A | Discharge status: Home Health | Payer: PPO | Attending: Neurological Surgery | Admitting: Neurological Surgery

## 2018-02-18 ENCOUNTER — Encounter (HOSPITAL_COMMUNITY)
Admission: RE | Disposition: A | Discharge status: Home Health | Payer: Self-pay | Source: Home / Self Care | Attending: Neurological Surgery

## 2018-02-18 ENCOUNTER — Encounter (HOSPITAL_COMMUNITY): Payer: Self-pay

## 2018-02-18 ENCOUNTER — Inpatient Hospital Stay (HOSPITAL_COMMUNITY): Payer: PPO

## 2018-02-18 DIAGNOSIS — G2581 Restless legs syndrome: Secondary | ICD-10-CM | POA: Diagnosis present

## 2018-02-18 DIAGNOSIS — Z419 Encounter for procedure for purposes other than remedying health state, unspecified: Secondary | ICD-10-CM

## 2018-02-18 DIAGNOSIS — Z7982 Long term (current) use of aspirin: Secondary | ICD-10-CM | POA: Diagnosis not present

## 2018-02-18 DIAGNOSIS — M545 Low back pain: Secondary | ICD-10-CM | POA: Diagnosis present

## 2018-02-18 DIAGNOSIS — F429 Obsessive-compulsive disorder, unspecified: Secondary | ICD-10-CM | POA: Diagnosis present

## 2018-02-18 DIAGNOSIS — Z981 Arthrodesis status: Secondary | ICD-10-CM

## 2018-02-18 DIAGNOSIS — G629 Polyneuropathy, unspecified: Secondary | ICD-10-CM | POA: Diagnosis not present

## 2018-02-18 DIAGNOSIS — K219 Gastro-esophageal reflux disease without esophagitis: Secondary | ICD-10-CM | POA: Diagnosis present

## 2018-02-18 DIAGNOSIS — M4326 Fusion of spine, lumbar region: Secondary | ICD-10-CM | POA: Diagnosis not present

## 2018-02-18 DIAGNOSIS — E785 Hyperlipidemia, unspecified: Secondary | ICD-10-CM | POA: Diagnosis not present

## 2018-02-18 DIAGNOSIS — M5416 Radiculopathy, lumbar region: Secondary | ICD-10-CM | POA: Diagnosis not present

## 2018-02-18 DIAGNOSIS — M19049 Primary osteoarthritis, unspecified hand: Secondary | ICD-10-CM | POA: Diagnosis present

## 2018-02-18 DIAGNOSIS — Z87891 Personal history of nicotine dependence: Secondary | ICD-10-CM

## 2018-02-18 DIAGNOSIS — J439 Emphysema, unspecified: Secondary | ICD-10-CM | POA: Diagnosis present

## 2018-02-18 DIAGNOSIS — Z888 Allergy status to other drugs, medicaments and biological substances status: Secondary | ICD-10-CM | POA: Diagnosis not present

## 2018-02-18 DIAGNOSIS — M5116 Intervertebral disc disorders with radiculopathy, lumbar region: Principal | ICD-10-CM | POA: Diagnosis present

## 2018-02-18 DIAGNOSIS — Z79899 Other long term (current) drug therapy: Secondary | ICD-10-CM | POA: Diagnosis not present

## 2018-02-18 DIAGNOSIS — J449 Chronic obstructive pulmonary disease, unspecified: Secondary | ICD-10-CM | POA: Diagnosis not present

## 2018-02-18 HISTORY — PX: MAXIMUM ACCESS (MAS) TRANSFORAMINAL LUMBAR INTERBODY FUSION (TLIF) 1 LEVEL: SHX6392

## 2018-02-18 SURGERY — MAXIMUM ACCESS (MAS) TRANSFORAMINAL LUMBAR INTERBODY FUSION (TLIF) 1 LEVEL
Anesthesia: General | Site: Back

## 2018-02-18 MED ORDER — LIDOCAINE HCL (CARDIAC) PF 100 MG/5ML IV SOSY
PREFILLED_SYRINGE | INTRAVENOUS | Status: DC | PRN
  Administered 2018-02-18: 100 mg via INTRAVENOUS

## 2018-02-18 MED ORDER — FENTANYL CITRATE (PF) 100 MCG/2ML IJ SOLN
INTRAMUSCULAR | Status: DC | PRN
  Administered 2018-02-18 (×2): 100 ug via INTRAVENOUS
  Administered 2018-02-18: 250 ug via INTRAVENOUS

## 2018-02-18 MED ORDER — PHENYLEPHRINE HCL 10 MG/ML IJ SOLN
INTRAMUSCULAR | Status: DC | PRN
  Administered 2018-02-18: 80 ug via INTRAVENOUS

## 2018-02-18 MED ORDER — PHENOL 1.4 % MT LIQD: 1.0000 | OROMUCOSAL | Status: DC | PRN

## 2018-02-18 MED ORDER — VANCOMYCIN HCL 1000 MG IV SOLR
INTRAVENOUS | Status: AC
  Filled 2018-02-18: qty 1000

## 2018-02-18 MED ORDER — ACETAMINOPHEN 650 MG RE SUPP: 650.0000 mg | RECTAL | Status: DC | PRN

## 2018-02-18 MED ORDER — SENNA 8.6 MG PO TABS
1.0000 | ORAL_TABLET | Freq: Two times a day (BID) | ORAL | Status: DC
  Administered 2018-02-18 – 2018-02-19 (×2): 8.6 mg via ORAL
  Filled 2018-02-18 (×2): qty 1

## 2018-02-18 MED ORDER — HYDROCODONE-ACETAMINOPHEN 7.5-325 MG PO TABS
1.0000 | ORAL_TABLET | ORAL | Status: DC | PRN
  Administered 2018-02-18 – 2018-02-19 (×5): 1 via ORAL
  Filled 2018-02-18 (×5): qty 1

## 2018-02-18 MED ORDER — FENTANYL CITRATE (PF) 250 MCG/5ML IJ SOLN
INTRAMUSCULAR | Status: AC
  Filled 2018-02-18: qty 10

## 2018-02-18 MED ORDER — DIVALPROEX SODIUM ER 500 MG PO TB24
1000.0000 mg | ORAL_TABLET | Freq: Every day | ORAL | Status: DC
  Administered 2018-02-18: 1000 mg via ORAL
  Filled 2018-02-18: qty 2

## 2018-02-18 MED ORDER — THROMBIN 20000 UNITS EX SOLR
CUTANEOUS | Status: AC
  Filled 2018-02-18: qty 20000

## 2018-02-18 MED ORDER — ACETAMINOPHEN 325 MG PO TABS: 650.0000 mg | ORAL_TABLET | ORAL | Status: DC | PRN

## 2018-02-18 MED ORDER — CEFAZOLIN SODIUM-DEXTROSE 2-4 GM/100ML-% IV SOLN
2.0000 g | INTRAVENOUS | Status: AC
  Administered 2018-02-18: 2 g via INTRAVENOUS

## 2018-02-18 MED ORDER — VANCOMYCIN HCL 1000 MG IV SOLR: INTRAVENOUS | Status: DC | PRN

## 2018-02-18 MED ORDER — PROPOFOL 10 MG/ML IV BOLUS
INTRAVENOUS | Status: AC
  Filled 2018-02-18: qty 20

## 2018-02-18 MED ORDER — POTASSIUM CHLORIDE IN NACL 20-0.9 MEQ/L-% IV SOLN: INTRAVENOUS | Status: DC

## 2018-02-18 MED ORDER — CHLORHEXIDINE GLUCONATE CLOTH 2 % EX PADS: 6.0000 | MEDICATED_PAD | Freq: Once | CUTANEOUS | Status: DC

## 2018-02-18 MED ORDER — ONDANSETRON HCL 4 MG/2ML IJ SOLN
INTRAMUSCULAR | Status: AC
  Filled 2018-02-18: qty 2

## 2018-02-18 MED ORDER — EPHEDRINE SULFATE 50 MG/ML IJ SOLN
INTRAMUSCULAR | Status: DC | PRN
  Administered 2018-02-18: 10 mg via INTRAVENOUS

## 2018-02-18 MED ORDER — THROMBIN 5000 UNITS EX SOLR
CUTANEOUS | Status: AC
  Filled 2018-02-18: qty 5000

## 2018-02-18 MED ORDER — PROPOFOL 10 MG/ML IV BOLUS
INTRAVENOUS | Status: DC | PRN
  Administered 2018-02-18: 130 mg via INTRAVENOUS

## 2018-02-18 MED ORDER — METHOCARBAMOL 1000 MG/10ML IJ SOLN
500.0000 mg | Freq: Four times a day (QID) | INTRAVENOUS | Status: DC | PRN
  Filled 2018-02-18: qty 5

## 2018-02-18 MED ORDER — LIDOCAINE 2% (20 MG/ML) 5 ML SYRINGE
INTRAMUSCULAR | Status: AC
  Filled 2018-02-18: qty 5

## 2018-02-18 MED ORDER — MIDAZOLAM HCL 2 MG/2ML IJ SOLN
INTRAMUSCULAR | Status: AC
  Filled 2018-02-18: qty 2

## 2018-02-18 MED ORDER — SODIUM CHLORIDE 0.9% FLUSH: 3.0000 mL | INTRAVENOUS | Status: DC | PRN

## 2018-02-18 MED ORDER — TAMSULOSIN HCL 0.4 MG PO CAPS
0.4000 mg | ORAL_CAPSULE | Freq: Every day | ORAL | Status: DC
  Administered 2018-02-18 – 2018-02-19 (×2): 0.4 mg via ORAL
  Filled 2018-02-18 (×2): qty 1

## 2018-02-18 MED ORDER — DEXAMETHASONE SODIUM PHOSPHATE 10 MG/ML IJ SOLN
INTRAMUSCULAR | Status: AC
  Filled 2018-02-18: qty 1

## 2018-02-18 MED ORDER — CEFAZOLIN SODIUM-DEXTROSE 2-4 GM/100ML-% IV SOLN
2.0000 g | Freq: Three times a day (TID) | INTRAVENOUS | Status: AC
  Administered 2018-02-18 (×2): 2 g via INTRAVENOUS
  Filled 2018-02-18 (×2): qty 100

## 2018-02-18 MED ORDER — PHENYLEPHRINE 40 MCG/ML (10ML) SYRINGE FOR IV PUSH (FOR BLOOD PRESSURE SUPPORT)
PREFILLED_SYRINGE | INTRAVENOUS | Status: AC
  Filled 2018-02-18: qty 10

## 2018-02-18 MED ORDER — DEXAMETHASONE SODIUM PHOSPHATE 4 MG/ML IJ SOLN
4.0000 mg | Freq: Four times a day (QID) | INTRAMUSCULAR | Status: DC
  Administered 2018-02-18 (×2): 4 mg via INTRAVENOUS
  Filled 2018-02-18 (×2): qty 1

## 2018-02-18 MED ORDER — SODIUM CHLORIDE 0.9 % IV SOLN
INTRAVENOUS | Status: DC | PRN
  Administered 2018-02-18: 11:00:00

## 2018-02-18 MED ORDER — CELECOXIB 200 MG PO CAPS
200.0000 mg | ORAL_CAPSULE | Freq: Two times a day (BID) | ORAL | Status: DC
  Administered 2018-02-18 – 2018-02-19 (×3): 200 mg via ORAL
  Filled 2018-02-18 (×3): qty 1

## 2018-02-18 MED ORDER — EPHEDRINE 5 MG/ML INJ
INTRAVENOUS | Status: AC
  Filled 2018-02-18: qty 10

## 2018-02-18 MED ORDER — MENTHOL 3 MG MT LOZG: 1.0000 | LOZENGE | OROMUCOSAL | Status: DC | PRN

## 2018-02-18 MED ORDER — ONDANSETRON HCL 4 MG/2ML IJ SOLN
INTRAMUSCULAR | Status: DC | PRN
  Administered 2018-02-18: 4 mg via INTRAVENOUS

## 2018-02-18 MED ORDER — THROMBIN 5000 UNITS EX SOLR
OROMUCOSAL | Status: DC | PRN
  Administered 2018-02-18: 11:00:00

## 2018-02-18 MED ORDER — ZIPRASIDONE HCL 40 MG PO CAPS
40.0000 mg | ORAL_CAPSULE | Freq: Every day | ORAL | Status: DC
  Administered 2018-02-18: 40 mg via ORAL
  Filled 2018-02-18 (×2): qty 1

## 2018-02-18 MED ORDER — DEXAMETHASONE SODIUM PHOSPHATE 10 MG/ML IJ SOLN
10.0000 mg | INTRAMUSCULAR | Status: AC
  Administered 2018-02-18: 10 mg via INTRAVENOUS

## 2018-02-18 MED ORDER — BUPIVACAINE HCL (PF) 0.25 % IJ SOLN
INTRAMUSCULAR | Status: DC | PRN
  Administered 2018-02-18: 10 mL

## 2018-02-18 MED ORDER — ONDANSETRON HCL 4 MG PO TABS: 4.0000 mg | ORAL_TABLET | Freq: Four times a day (QID) | ORAL | Status: DC | PRN

## 2018-02-18 MED ORDER — THROMBIN 20000 UNITS EX SOLR
CUTANEOUS | Status: DC | PRN
  Administered 2018-02-18: 11:00:00

## 2018-02-18 MED ORDER — CEFAZOLIN SODIUM-DEXTROSE 2-4 GM/100ML-% IV SOLN
INTRAVENOUS | Status: AC
  Filled 2018-02-18: qty 100

## 2018-02-18 MED ORDER — HEPARIN SODIUM (PORCINE) 1000 UNIT/ML IJ SOLN
INTRAMUSCULAR | Status: AC
  Filled 2018-02-18: qty 1

## 2018-02-18 MED ORDER — ROCURONIUM BROMIDE 100 MG/10ML IV SOLN
INTRAVENOUS | Status: DC | PRN
  Administered 2018-02-18: 80 mg via INTRAVENOUS

## 2018-02-18 MED ORDER — MORPHINE SULFATE (PF) 2 MG/ML IV SOLN: 2.0000 mg | INTRAVENOUS | Status: DC | PRN

## 2018-02-18 MED ORDER — SUGAMMADEX SODIUM 200 MG/2ML IV SOLN
INTRAVENOUS | Status: DC | PRN
  Administered 2018-02-18: 200 mg via INTRAVENOUS

## 2018-02-18 MED ORDER — HEPARIN SODIUM (PORCINE) 10000 UNIT/ML IJ SOLN
INTRAMUSCULAR | Status: DC | PRN
  Administered 2018-02-18: 5000 [IU]

## 2018-02-18 MED ORDER — ASPIRIN 81 MG PO CHEW
81.0000 mg | CHEWABLE_TABLET | Freq: Every day | ORAL | Status: DC
  Administered 2018-02-18 – 2018-02-19 (×2): 81 mg via ORAL
  Filled 2018-02-18 (×2): qty 1

## 2018-02-18 MED ORDER — ONDANSETRON HCL 4 MG/2ML IJ SOLN: 4.0000 mg | Freq: Four times a day (QID) | INTRAMUSCULAR | Status: DC | PRN

## 2018-02-18 MED ORDER — SODIUM CHLORIDE 0.9% FLUSH
3.0000 mL | Freq: Two times a day (BID) | INTRAVENOUS | Status: DC
  Administered 2018-02-18: 3 mL via INTRAVENOUS

## 2018-02-18 MED ORDER — METHOCARBAMOL 500 MG PO TABS
500.0000 mg | ORAL_TABLET | Freq: Four times a day (QID) | ORAL | Status: DC | PRN
  Administered 2018-02-18 – 2018-02-19 (×2): 500 mg via ORAL
  Filled 2018-02-18 (×2): qty 1

## 2018-02-18 MED ORDER — BUPIVACAINE HCL (PF) 0.25 % IJ SOLN
INTRAMUSCULAR | Status: AC
  Filled 2018-02-18: qty 30

## 2018-02-18 MED ORDER — DEXAMETHASONE 4 MG PO TABS
4.0000 mg | ORAL_TABLET | Freq: Four times a day (QID) | ORAL | Status: DC
  Administered 2018-02-18 – 2018-02-19 (×3): 4 mg via ORAL
  Filled 2018-02-18 (×3): qty 1

## 2018-02-18 MED ORDER — ROCURONIUM BROMIDE 50 MG/5ML IV SOSY
PREFILLED_SYRINGE | INTRAVENOUS | Status: AC
  Filled 2018-02-18: qty 10

## 2018-02-18 MED ORDER — SODIUM CHLORIDE 0.9 % IV SOLN: 250.0000 mL | INTRAVENOUS | Status: DC

## 2018-02-18 MED ORDER — LACTATED RINGERS IV SOLN
INTRAVENOUS | Status: DC
  Administered 2018-02-18 (×2): via INTRAVENOUS

## 2018-02-18 SURGICAL SUPPLY — 74 items
ADH SKN CLS APL DERMABOND .7 (GAUZE/BANDAGES/DRESSINGS) ×1
APL SKNCLS STERI-STRIP NONHPOA (GAUZE/BANDAGES/DRESSINGS) ×1
BAG DECANTER FOR FLEXI CONT (MISCELLANEOUS) ×2 IMPLANT
BASKET BONE COLLECTION (BASKET) ×2 IMPLANT
BENZOIN TINCTURE PRP APPL 2/3 (GAUZE/BANDAGES/DRESSINGS) ×2 IMPLANT
BLADE CLIPPER SURG (BLADE) IMPLANT
BUR MATCHSTICK NEURO 3.0 LAGG (BURR) ×2 IMPLANT
CANISTER SUCT 3000ML PPV (MISCELLANEOUS) ×2 IMPLANT
CARTRIDGE OIL MAESTRO DRILL (MISCELLANEOUS) ×1 IMPLANT
CONT SPEC 4OZ CLIKSEAL STRL BL (MISCELLANEOUS) ×2 IMPLANT
COVER BACK TABLE 24X17X13 BIG (DRAPES) IMPLANT
COVER BACK TABLE 60X90IN (DRAPES) ×2 IMPLANT
COVER WAND RF STERILE (DRAPES) ×1 IMPLANT
DERMABOND ADVANCED (GAUZE/BANDAGES/DRESSINGS) ×1
DERMABOND ADVANCED .7 DNX12 (GAUZE/BANDAGES/DRESSINGS) IMPLANT
DIFFUSER DRILL AIR PNEUMATIC (MISCELLANEOUS) ×2 IMPLANT
DRAPE C-ARM 42X72 X-RAY (DRAPES) ×2 IMPLANT
DRAPE C-ARMOR (DRAPES) ×2 IMPLANT
DRAPE LAPAROTOMY 100X72X124 (DRAPES) ×2 IMPLANT
DRAPE POUCH INSTRU U-SHP 10X18 (DRAPES) ×2 IMPLANT
DRAPE SURG 17X23 STRL (DRAPES) ×2 IMPLANT
DRSG OPSITE POSTOP 4X6 (GAUZE/BANDAGES/DRESSINGS) ×1 IMPLANT
DURAPREP 26ML APPLICATOR (WOUND CARE) ×2 IMPLANT
ELECT REM PT RETURN 9FT ADLT (ELECTROSURGICAL) ×2
ELECTRODE REM PT RTRN 9FT ADLT (ELECTROSURGICAL) ×1 IMPLANT
EVACUATOR 1/8 PVC DRAIN (DRAIN) ×2 IMPLANT
GAUZE 4X4 16PLY RFD (DISPOSABLE) IMPLANT
GLOVE BIO SURGEON STRL SZ7 (GLOVE) IMPLANT
GLOVE BIO SURGEON STRL SZ8 (GLOVE) ×5 IMPLANT
GLOVE BIOGEL PI IND STRL 7.0 (GLOVE) IMPLANT
GLOVE BIOGEL PI IND STRL 8 (GLOVE) IMPLANT
GLOVE BIOGEL PI IND STRL 8.5 (GLOVE) IMPLANT
GLOVE BIOGEL PI INDICATOR 7.0 (GLOVE)
GLOVE BIOGEL PI INDICATOR 8 (GLOVE) ×2
GLOVE BIOGEL PI INDICATOR 8.5 (GLOVE) ×2
GLOVE ECLIPSE 8.0 STRL XLNG CF (GLOVE) ×4 IMPLANT
GLOVE SURG SS PI 7.5 STRL IVOR (GLOVE) ×2 IMPLANT
GOWN STRL REUS W/ TWL LRG LVL3 (GOWN DISPOSABLE) IMPLANT
GOWN STRL REUS W/ TWL XL LVL3 (GOWN DISPOSABLE) ×2 IMPLANT
GOWN STRL REUS W/TWL 2XL LVL3 (GOWN DISPOSABLE) ×1 IMPLANT
GOWN STRL REUS W/TWL LRG LVL3 (GOWN DISPOSABLE) ×4
GOWN STRL REUS W/TWL XL LVL3 (GOWN DISPOSABLE) ×4
HEMOSTAT POWDER KIT SURGIFOAM (HEMOSTASIS) ×1 IMPLANT
KIT BASIN OR (CUSTOM PROCEDURE TRAY) ×2 IMPLANT
KIT BONE MRW ASP ANGEL CPRP (KITS) ×1 IMPLANT
KIT TURNOVER KIT B (KITS) ×2 IMPLANT
MATRIX SPINE STRIP NEOCORE 5CC (Putty) IMPLANT
MILL MEDIUM DISP (BLADE) ×1 IMPLANT
NDL HYPO 25X1 1.5 SAFETY (NEEDLE) ×1 IMPLANT
NEEDLE HYPO 25X1 1.5 SAFETY (NEEDLE) ×2 IMPLANT
NS IRRIG 1000ML POUR BTL (IV SOLUTION) ×2 IMPLANT
OIL CARTRIDGE MAESTRO DRILL (MISCELLANEOUS) ×2
PACK LAMINECTOMY NEURO (CUSTOM PROCEDURE TRAY) ×2 IMPLANT
PAD ARMBOARD 7.5X6 YLW CONV (MISCELLANEOUS) ×6 IMPLANT
PUTTY DBM ALLOSYNC PURE 10CC (Putty) ×1 IMPLANT
ROD TI LORDOTIC 5.5X45 (Rod) ×2 IMPLANT
SCREW KODIAK 6.5X40 (Screw) ×2 IMPLANT
SCREW KODIAK 6.5X45 (Screw) ×2 IMPLANT
SET SCREW (Screw) ×8 IMPLANT
SET SCREW SPNE (Screw) IMPLANT
SPACER IDENT PC 10 11X10X30 (Spacer) ×1 IMPLANT
SPONGE LAP 4X18 RFD (DISPOSABLE) IMPLANT
SPONGE SURGIFOAM ABS GEL 100 (HEMOSTASIS) ×2 IMPLANT
STRIP CLOSURE SKIN 1/2X4 (GAUZE/BANDAGES/DRESSINGS) ×3 IMPLANT
STRIP MATRIX NEOCORE 5CC (Putty) ×1 IMPLANT
SUT VIC AB 0 CT1 18XCR BRD8 (SUTURE) ×1 IMPLANT
SUT VIC AB 0 CT1 8-18 (SUTURE) ×2
SUT VIC AB 2-0 CP2 18 (SUTURE) ×2 IMPLANT
SUT VIC AB 3-0 SH 8-18 (SUTURE) ×4 IMPLANT
SYR 3ML LL SCALE MARK (SYRINGE) ×1 IMPLANT
TOWEL GREEN STERILE (TOWEL DISPOSABLE) ×2 IMPLANT
TOWEL GREEN STERILE FF (TOWEL DISPOSABLE) ×2 IMPLANT
TRAY FOLEY MTR SLVR 16FR STAT (SET/KITS/TRAYS/PACK) ×1 IMPLANT
WATER STERILE IRR 1000ML POUR (IV SOLUTION) ×2 IMPLANT

## 2018-02-18 NOTE — Anesthesia Preprocedure Evaluation (Signed)
Anesthesia Evaluation  Patient identified by MRN, date of birth, ID band Patient awake    Reviewed: Allergy & Precautions, NPO status , Patient's Chart, lab work & pertinent test results  Airway Mallampati: I  TM Distance: >3 FB Neck ROM: Full    Dental   Pulmonary COPD, former smoker,    Pulmonary exam normal        Cardiovascular Normal cardiovascular exam     Neuro/Psych Anxiety Bipolar Disorder Schizophrenia    GI/Hepatic GERD  Medicated and Controlled,  Endo/Other    Renal/GU      Musculoskeletal   Abdominal   Peds  Hematology   Anesthesia Other Findings   Reproductive/Obstetrics                             Anesthesia Physical Anesthesia Plan  ASA: III  Anesthesia Plan: General   Post-op Pain Management:    Induction: Intravenous  PONV Risk Score and Plan: 1  Airway Management Planned: Oral ETT  Additional Equipment:   Intra-op Plan:   Post-operative Plan: Extubation in OR  Informed Consent: I have reviewed the patients History and Physical, chart, labs and discussed the procedure including the risks, benefits and alternatives for the proposed anesthesia with the patient or authorized representative who has indicated his/her understanding and acceptance.       Plan Discussed with: CRNA and Surgeon  Anesthesia Plan Comments:         Anesthesia Quick Evaluation

## 2018-02-18 NOTE — Progress Notes (Signed)
Orthopedic Tech Progress Note Patient Details:  [name] [birth year] [id number] PACU RN said patient has brace Patient ID: [name], male   DOB: 1945-12-18, 73 y.o.   MRN: [id number]   [name] 02/18/2018, 1:32 PM

## 2018-02-18 NOTE — H&P (Signed)
Subjective: Patient is a 73 y.o. male admitted for TLIF. Onset of symptoms was several months ago, gradually worsening since that time.  The pain is rated severe, and is located at the across the lower back and radiates to leg. The pain is described as aching and occurs all day. The symptoms have been progressive. Symptoms are exacerbated by exercise. MRI or CT showed recurrent far lateral HNP   Past Medical History:  Diagnosis Date  . Arthritis    in his fingers  . Emphysema   . Episodic confusion    Possible bipolar disorder  . GERD (gastroesophageal reflux disease)   . History of chicken pox   . HNP (herniated nucleus pulposus), lumbar   . Memory deficit 07/31/2012  . OCD (obsessive compulsive disorder)   . Peripheral neuropathy   . Restless leg syndrome 01/10/2003  . Unspecified psychosis 03/01/2012    Past Surgical History:  Procedure Laterality Date  . liver excision     for non-cancerous mass  . LUMBAR LAMINECTOMY/DECOMPRESSION MICRODISCECTOMY Left 06/22/2017   Procedure: Extraforaminal Microdiscectomy  - L4-L5 - left;  Surgeon: Eustace Moore, MD;  Location: [facility];  Service: Neurosurgery;  Laterality: Left;  . LUNG SURGERY  2005   Lipoma Removal    Prior to Admission medications   Medication Sig Start Date End Date Taking? Authorizing Provider  aspirin 81 MG tablet Take 1 tablet (81 mg total) by mouth daily. 08/06/17  Yes Birdie Sons, MD  Coenzyme Q10 (COQ10) 100 MG CAPS Take 300 mg by mouth at bedtime.    Yes [provider]  divalproex (DEPAKOTE ER) 500 MG 24 hr tablet Take 1,000 mg by mouth at bedtime.    Yes [provider]  Omega-3 Fatty Acids (FISH OIL) 1000 MG CAPS Take 1,000 mg by mouth daily.   Yes [provider]  omeprazole (PRILOSEC) 40 MG capsule TAKE 1 CAPSULE BY MOUTH EVERY DAY Patient taking differently: Take 40 mg by mouth daily.  10/01/17  Yes Birdie Sons, MD  simvastatin (ZOCOR) 40 MG tablet TAKE 1 TABLET (40 MG TOTAL)  BY MOUTH DAILY AT 6 PM. 12/24/17  Yes Birdie Sons, MD  ziprasidone (GEODON) 40 MG capsule Take 40 mg by mouth daily with supper.    Yes [provider]   Allergies  Allergen Reactions  . Ropinirole Hcl Other (See Comments)    Confusion, agiitation, disorientation, Hallucinations    Social History   Tobacco Use  . Smoking status: Former Smoker    Packs/day: 1.00    Years: 50.00    Pack years: 50.00    Types: Cigarettes    Last attempt to quit: 01/09/2018    Years since quitting: 0.1  . Smokeless tobacco: Never Used  Substance Use Topics  . Alcohol use: No    Family History  Problem Relation Age of Onset  . Heart disease Mother   . Cancer Father   . Heart disease Father   . Diabetes Sister   . Heart disease Sister   . Seizures Paternal Grandfather      Review of Systems  Positive ROS: neg  All other systems have been reviewed and were otherwise negative with the exception of those mentioned in the HPI and as above.  Objective: Vital signs in last 24 hours: Temp:  [97.4 F (36.3 C)] 97.4 F (36.3 C) (02/10 0902) Pulse Rate:  [56] 56 (02/10 0902) Resp:  [20] 20 (02/10 0902) BP: (151)/(76) 151/76 (02/10 0902) SpO2:  [  100 %] 100 % (02/10 0902) Weight:  [77.1 kg] 77.1 kg (02/10 0904)  General Appearance: Alert, cooperative, no distress, appears stated age Head: Normocephalic, without obvious abnormality, atraumatic Eyes: PERRL, conjunctiva/corneas clear, EOM's intact    Neck: Supple, symmetrical, trachea midline Back: Symmetric, no curvature, ROM normal, no CVA tenderness Lungs:  respirations unlabored Heart: Regular rate and rhythm Abdomen: Soft, non-tender Extremities: Extremities normal, atraumatic, no cyanosis or edema Pulses: 2+ and symmetric all extremities Skin: Skin color, texture, turgor normal, no rashes or lesions  NEUROLOGIC:   Mental status: Alert and oriented x4,  no aphasia, good attention span, fund of knowledge, and memory Motor  Exam - grossly normal Sensory Exam - grossly normal Reflexes: 1+ Coordination - grossly normal Gait - grossly normal Balance - grossly normal Cranial Nerves: I: smell Not tested  II: visual acuity  OS: nl    OD: nl  II: visual fields Full to confrontation  II: pupils Equal, round, reactive to light  III,VII: ptosis None  III,IV,VI: extraocular muscles  Full ROM  V: mastication Normal  V: facial light touch sensation  Normal  V,VII: corneal reflex  Present  VII: facial muscle function - upper  Normal  VII: facial muscle function - lower Normal  VIII: hearing Not tested  IX: soft palate elevation  Normal  IX,X: gag reflex Present  XI: trapezius strength  5/5  XI: sternocleidomastoid strength 5/5  XI: neck flexion strength  5/5  XII: tongue strength  Normal    Data Review Lab Results  Component Value Date   WBC 6.0 02/07/2018   HGB 13.7 02/07/2018   HCT 41.2 02/07/2018   MCV 99.0 02/07/2018   PLT 154 02/07/2018   Lab Results  Component Value Date   NA 143 02/07/2018   K 4.1 02/07/2018   CL 109 02/07/2018   CO2 27 02/07/2018   BUN 15 02/07/2018   CREATININE 1.16 02/07/2018   GLUCOSE 99 02/07/2018   Lab Results  Component Value Date   INR 1.03 02/07/2018    Assessment/Plan:  Estimated body mass index is 24.38 kg/m as calculated from the following:   Height as of this encounter: 5\' 10"  (1.778 m).   Weight as of this encounter: 77.1 kg. Patient admitted for TLIF. Patient has failed a reasonable attempt at conservative therapy.  I explained the condition and procedure to the patient and answered any questions.  Patient wishes to proceed with procedure as planned. Understands risks/ benefits and typical outcomes of procedure.   Eustace Moore 02/18/2018 9:39 AM

## 2018-02-18 NOTE — Transfer of Care (Signed)
Immediate Anesthesia Transfer of Care Note  Patient: [name]  Procedure(s) Performed: Transforaminal Lumbar Interbody Fusion - Lumbar Four - Lumbar Five ([name] Back)  Patient Location: PACU  Anesthesia Type:General  Level of Consciousness: awake and alert   Airway & Oxygen Therapy: Patient Spontanous Breathing and Patient connected to nasal cannula oxygen  Post-op Assessment: Report given to RN, Post -op Vital signs reviewed and stable and Patient moving all extremities  Post vital signs: Reviewed and stable  Last Vitals:  Vitals Value Taken Time  BP 156/76 02/18/2018 12:55 PM  Temp 36.7 C 02/18/2018 12:55 PM  Pulse 76 02/18/2018  1:01 PM  Resp 9 02/18/2018  1:01 PM  SpO2 95 % 02/18/2018  1:01 PM  Vitals shown include unvalidated device data.  Last Pain:  Vitals:   02/18/18 1255  TempSrc:   PainSc: Asleep         Complications: No apparent anesthesia complications

## 2018-02-18 NOTE — Op Note (Signed)
02/18/2018  12:39 PM  PATIENT:  [name]  73 y.o. male  PRE-OPERATIVE DIAGNOSIS: Left L4-5 recurrent extraforaminal disc herniation with left L4 radiculopath  POST-OPERATIVE DIAGNOSIS:  same  PROCEDURE:   1. Decompressive lumbar laminectomy L4-5 requiring more work than would be required for a simple exposure of the disk for TLIF in order to adequately decompress the neural elements and address the spinal stenosis 2.  Transforaminal lumbar interbody fusion L4 5 using his titanium interbody cage packed with morcellized allograft and autograft soaked with a bone marrow aspirate obtained through a separate fascial incision over the right iliac crest 3. Posterior fixation L4-5 using Alphatec cortical pedicle screws.  4. Intertransverse arthrodesis L4-5 using morcellized autograft and allograft.  SURGEON:  Sherley Bounds, MD  ASSISTANTS: Dr. Venetia Constable  ANESTHESIA:  General  EBL: Less than 100 ml  Total I/O In: 900 [I.V.:900] Out: -   BLOOD ADMINISTERED:none  DRAINS: none   INDICATION FOR PROCEDURE: This patient presented with severe left leg pain. Imaging revealed current disc herniation L4-5 extraforaminal space on the left. The patient tried a reasonable attempt at conservative medical measures without relief. I recommended decompression and instrumented fusion to address the stenosis as well as the segmental  instability.  Patient understood the risks, benefits, and alternatives and potential outcomes and wished to proceed.  PROCEDURE DETAILS:  The patient was brought to the operating room. After induction of generalized endotracheal anesthesia the patient was rolled into the prone position on chest rolls and all pressure points were padded. The patient's lumbar region was cleaned and then prepped with DuraPrep and draped in the usual sterile fashion. Anesthesia was injected and then a dorsal midline incision was made and carried down to the lumbosacral fascia. The fascia was  opened and the paraspinous musculature was taken down in a subperiosteal fashion to expose L4-5. A self-retaining retractor was placed. Intraoperative fluoroscopy confirmed my level, and I started with placement of the L4 cortical pedicle screws. The pedicle screw entry zones were identified utilizing surface landmarks and  AP and lateral fluoroscopy. I scored the cortex with the high-speed drill and then used the hand drill to drill an upward and outward direction into the pedicle. I then tapped line to line. I then placed a 6.5 x 2 mm cortical pedicle screw into the pedicles of L4 bilaterally.  I then dissected in a suprafascial plane to expose the iliac crest.  Open the fascia used a Jamshidi needle to extract 60 cc of bone marrow aspirate from the iliac crest.  This was then spun down by [facility] device and 2 to 4 cc of  BMAC was soaked on morselized allograft for later arthrodesis.  I dried the hole with Surgifoam and closed the fascia.  I then turned my attention to the decompression and complete lumbar laminectomies, hemi- facetectomies, and foraminotomies were performed at L4-5. The patient had significant spinal stenosis and this required more work than would be required for a simple exposure of the disc for posterior lumbar interbody fusion which would only require a limited laminotomy. Much more generous decompression and generous foraminotomy was undertaken in order to adequately decompress the neural elements and address the patient's leg pain. The yellow ligament was removed to expose the underlying dura and nerve roots, and generous foraminotomies were performed to adequately decompress the neural elements. Both the exiting and traversing nerve roots were decompressed on both sides until a coronary dilator passed easily along the nerve roots.  There is a large  recurrent extraforaminal disc herniation L4-5 on the left up under the L4 nerve root.  This was dissected free and removed with pituitary rongeurs  and a nerve hook.  Once the decompression was complete, I turned my attention to the posterior lower lumbar interbody fusion. The epidural venous vasculature was coagulated and cut sharply. Disc space was incised and the initial discectomy was performed with pituitary rongeurs. The disc space was distracted with sequential distractors to a height of 11 mm.  An interspinous letter was used to distract the disc space.  we then used a series of scrapers and shavers to prepare the endplates for fusion. The midline was prepared with Epstein curettes. Once the complete discectomy was finished, we packed an appropriate sized interbody cage with local autograft and morcellized allograft, gently retracted the nerve root, and tapped the cage into position at L4-5.  Anterior to the cage was packed with autograft and morselized allograft soaked in the bone marrow aspirate.  We then turned our attention to the placement of the lower pedicle screws. The pedicle screw entry zones were identified utilizing surface landmarks and fluoroscopy. I drilled into each pedicle utilizing the hand drill, and tapped each pedicle with the appropriate tap. We palpated with a ball probe to assure no break in the cortex. We then placed 6.5 x 45 mm pedicle screws into the pedicles bilaterally at L5. We then decorticated the transverse processes and laid a mixture of morcellized autograft and allograft out over these to perform intertransverse arthrodesis at L4-5. We then placed lordotic rods into the multiaxial screw heads of the pedicle screws and locked these in position with the locking caps and anti-torque device. We then checked our construct with AP and lateral fluoroscopy. Irrigated with copious amounts of bacitracin-containing saline solution. Inspected the nerve roots once again to assure adequate decompression, lined to the dura with Gelfoam, placed powdered vancomycin into the wound, and closed the muscle and the fascia with 0 Vicryl.  Closed the subcutaneous tissues with 2-0 Vicryl and subcuticular tissues with 3-0 Vicryl. The skin was closed with benzoin and Steri-Strips. Dressing was then applied, the patient was awakened from general anesthesia and transported to the recovery room in stable condition. At the end of the procedure all sponge, needle and instrument counts were correct.   PLAN OF CARE: admit to inpatient  PATIENT DISPOSITION:  PACU - hemodynamically stable.   Delay start of Pharmacological VTE agent (>24hrs) due to surgical blood loss or risk of bleeding:  yes

## 2018-02-18 NOTE — Anesthesia Procedure Notes (Signed)
Procedure Name: Intubation Date/Time: 02/18/2018 10:30 AM Performed by: Ivann Trimarco T, CRNA Pre-anesthesia Checklist: Patient identified, Emergency Drugs available, Suction available and Patient being monitored Patient Re-evaluated:Patient Re-evaluated prior to induction Oxygen Delivery Method: Circle system utilized Preoxygenation: Pre-oxygenation with 100% oxygen Induction Type: IV induction Ventilation: Mask ventilation without difficulty Laryngoscope Size: Miller and 3 Grade View: Grade I Tube type: Oral Tube size: 7.5 mm Number of attempts: 1 Airway Equipment and Method: Patient positioned with wedge pillow and Stylet Placement Confirmation: ETT inserted through vocal cords under direct vision,  positive ETCO2 and breath sounds checked- equal and bilateral Secured at: 22 cm Tube secured with: Tape Dental Injury: Teeth and Oropharynx as per pre-operative assessment

## 2018-02-19 MED ORDER — HYDROCODONE-ACETAMINOPHEN 7.5-325 MG PO TABS: 1.0000 | ORAL_TABLET | ORAL | 0 refills | Status: DC | PRN

## 2018-02-19 NOTE — Progress Notes (Signed)
OT evaluation:   PTA patient reports independent and driving.  Admitted for below and limited by precautions, adherence to precautions and decreased activity tolerance.  Educated on brace mgmt/wear schedule, safety, precautions, recommendations, and ADL compensatory techniques.  Patient requires supervision for grooming, min assist for UB ADLs, min assist for LB ADLs, and supervision for transfers.  Requires cueing throughout session to adhere to precautions, especially during functional self care tasks.  He will have 24/7 support of family to assist as needed. Patient will benefit from continued OT services while admitted and after dc at [facility] level in order to optimize independence and safety with ADLs/mobility.    02/19/18 0700  OT Visit Information  Last OT Received On 02/19/18  Assistance Needed +1  History of Present Illness Pt is a 73 y/o male s/p TLIF, L 4-5. PMH:  arthritis, emphysema, restless leg syndrome, L lumbar laminectomy.   Precautions  Precautions Back  Precaution Booklet Issued Yes (comment)  Precaution Comments patient educated on precautions  Required Braces or Orthoses Spinal Brace  Spinal Brace Lumbar corset;Applied in sitting position  Restrictions  Weight Bearing Restrictions No  Home Living  Family/patient expects to be discharged to: Private residence  Living Arrangements Alone  Available Help at Discharge Family;Available 24 hours/day  Type of Home House  Home Access Level entry  Entrance Stairs-Number of Steps  (1 step between rooms)  Home Layout One level  Bathroom Shower/Tub Tub/shower unit  Research officer, [employer] - single point;Walker - 2 wheels;Walker - 4 wheels;BSC  Prior Function  Level of Independence Independent  Communication  Communication HOH  Pain Assessment  Pain Assessment No/denies pain  Cognition  Arousal/Alertness Awake/alert  Behavior During Therapy WFL for tasks assessed/performed  Overall Cognitive Status  Within Functional Limits for tasks assessed  General Comments decreased recall of back precautions and brace mgmt   Upper Extremity Assessment  Upper Extremity Assessment Overall WFL for tasks assessed  Lower Extremity Assessment  Lower Extremity Assessment Defer to PT evaluation  Cervical / Trunk Assessment  Cervical / Trunk Assessment Other exceptions  Cervical / Trunk Exceptions s/p spinal surgery  ADL  Overall ADL's  Needs assistance/impaired  Grooming Supervision/safety;Cueing for compensatory techniques;Standing  Grooming Details (indicate cue type and reason) cueing for back precautions   Upper Body Bathing Set up;Sitting  Lower Body Bathing Minimal assistance;Sit to/from stand;Cueing for compensatory techniques;Cueing for back precautions  Lower Body Bathing Details (indicate cue type and reason) educated on bathing seated, recommended assist for bathing in shower- pt agreeable   Upper Body Dressing  Sitting;Minimal assistance  Upper Body Dressing Details (indicate cue type and reason) assist to don back brace   Lower Body Dressing Minimal assistance;Sit to/from stand  Lower Body Dressing Details (indicate cue type and reason) able to complete figure 4 technique, but continue to require a little assist to manage socks-- will have support of sister, and educated on wearing slip on shoes   Toilet Transfer Supervision/safety;Ambulation;RW  Toilet Transfer Details (indicate cue type and reason) simulated in room   Tub/Shower Transfer Details (indicate cue type and reason) agreeable to complete transfer initally with assistance from HHOT/aide  Functional mobility during ADLs Supervision/safety;Rolling walker;Cueing for safety  General ADL Comments patient educated on precautions and ADL comepnsatory techniques but requires cueing to adhere to precautions throughout session   Bed Mobility  Overal bed mobility Needs Assistance  Bed Mobility Sit to Sidelying;Rolling;Supine to Sit  Rolling  Supervision  Supine to sit Supervision  Sit to sidelying Supervision  General bed mobility comments patient initally in bed with brace on, upon therapist entering room patient transitioning from supine to sitting before therapist could correct technique (then educated on proper log roll technique) and at completion of session returned to bed using log roll technique with supervision   Transfers  Overall transfer level Needs assistance  Equipment used Rolling walker (2 wheeled)  Transfers Sit to/from Stand  Sit to Stand Supervision  General transfer comment cueing for safety and hand placement   Balance  Overall balance assessment Mild deficits observed, not formally tested  OT - End of Session  Equipment Utilized During Treatment Back brace;Rolling walker  Activity Tolerance Patient tolerated treatment well  Patient left in bed;with call bell/phone within reach  Nurse Communication Mobility status  OT Assessment  OT Recommendation/Assessment Patient needs continued OT Services  OT Visit Diagnosis Other abnormalities of gait and mobility (R26.89)  OT Problem List Decreased activity tolerance;Impaired balance (sitting and/or standing);Decreased safety awareness;Decreased knowledge of use of DME or AE;Decreased knowledge of precautions  OT Plan  OT Frequency (ACUTE ONLY) Min 2X/week  OT Treatment/Interventions (ACUTE ONLY) Self-care/ADL training;DME and/or AE instruction;Therapeutic activities;Patient/family education;Balance training  AM-PAC OT "6 Clicks" Daily Activity Outcome Measure (Version 2)  Help from another person eating meals? 4  Help from another person taking care of personal grooming? 4  Help from another person toileting, which includes using toliet, bedpan, or urinal? 4  Help from another person bathing (including washing, rinsing, drying)? 3  Help from another person to put on and taking off regular upper body clothing? 4  Help from another person to put on and taking off  regular lower body clothing? 3  6 Click Score 22  OT Recommendation  Recommendations for Other Services PT consult  Follow Up Recommendations Home health OT;Supervision/Assistance - 24 hour  OT Equipment None recommended by OT  Individuals Consulted  Consulted and Agree with Results and Recommendations Patient  Acute Rehab OT Goals  Patient Stated Goal return home   OT Goal Formulation With patient  Time For Goal Achievement 03/05/18  Potential to Achieve Goals Good  OT Time Calculation  OT Start Time (ACUTE ONLY) 0725  OT Stop Time (ACUTE ONLY) 0742  OT Time Calculation (min) 17 min  OT General Charges  $OT Visit 1 Visit  OT Evaluation  $OT Eval Low Complexity 1 Low  Written Expression  Dominant Hand  Right   Delight Stare, OT Acute Rehabilitation Services Pager [phone] Office [phone]

## 2018-02-19 NOTE — Progress Notes (Signed)
Patient alert and oriented, mae's well, voiding adequate amount of urine, swallowing without difficulty, no c/o pain at time of discharge. Patient discharged home with family. Script and discharged instructions given to patient. Patient and family stated understanding of instructions given. Patient has an appointment with Dr. Jones °

## 2018-02-19 NOTE — Anesthesia Postprocedure Evaluation (Signed)
Anesthesia Post Note  Patient: [name]  Procedure(s) Performed: Transforaminal Lumbar Interbody Fusion - Lumbar Four - Lumbar Five ([name] Back)     Patient location during evaluation: PACU Anesthesia Type: General Level of consciousness: awake and alert Pain management: pain level controlled Vital Signs Assessment: post-procedure vital signs reviewed and stable Respiratory status: spontaneous breathing, nonlabored ventilation, respiratory function stable and patient connected to nasal cannula oxygen Cardiovascular status: blood pressure returned to baseline and stable Postop Assessment: no apparent nausea or vomiting Anesthetic complications: no    Last Vitals:  Vitals:   02/19/18 0308 02/19/18 0725  BP: 136/71 (!) 162/88  Pulse: 69 65  Resp: 18 18  Temp: (!) 36.4 C 36.6 C  SpO2: 95% 98%    Last Pain:  Vitals:   02/19/18 0725  TempSrc: Oral  PainSc:                  [name]

## 2018-02-19 NOTE — Evaluation (Signed)
Physical Therapy Evaluation Patient Details Name: [name] MRN: [id number] DOB: October 28, 1945 Today's Date: 02/19/2018   History of Present Illness  Pt is a 73 y/o male s/p TLIF, L 4-5. PMH:  arthritis, emphysema, restless leg syndrome, L lumbar laminectomy.   Clinical Impression  Pt is a pleasant man who is hard of hearing and agreed to participate in therapy. Pt presents with decreased functional mobility such as bed mobility and transfers while staying within precautions limiting his ability to return home safely. Pt required education and assistance for donning spinal brace. Pt was further educated on performing car transfers and activity progression.  Pt required cuing for mobility tasks within precautions. Pt able to recall all precautions at end of session. Pt would benefit from further acute physical therapy to review precautions and ensure safe functional mobility to return home.     Follow Up Recommendations Home health PT    Equipment Recommendations  Rolling walker with 5" wheels    Recommendations for Other Services OT consult;Rehab consult     Precautions / Restrictions Precautions Precautions: Back Precaution Booklet Issued: Yes (comment) Precaution Comments: patient educated on precautions Required Braces or Orthoses: Spinal Brace Spinal Brace: Lumbar corset;Applied in sitting position Restrictions Weight Bearing Restrictions: No      Mobility  Bed Mobility Overal bed mobility: Needs Assistance Bed Mobility: Rolling;Sidelying to Sit Rolling: Supervision Sidelying to sit: Supervision Supine to sit: Supervision   Sit to sidelying: Supervision General bed mobility comments: required verbal cues for maintaining precautions   Transfers Overall transfer level: Needs assistance Equipment used: Rolling walker (2 wheeled) Transfers: Sit to/from Stand Sit to Stand: Supervision         General transfer comment: requried cuing for keeping walker close during  stand to sit  Ambulation/Gait Ambulation/Gait assistance: Supervision Gait Distance (Feet): 350 Feet Assistive device: Rolling walker (2 wheeled) Gait Pattern/deviations: WFL(Within Functional Limits)   Gait velocity interpretation: 1.31 - 2.62 ft/sec, indicative of limited community ambulator General Gait Details: pt ambulates with step through pattern with UE support from RW, pt reported fatigue after ambulation   Stairs Stairs: Yes Stairs assistance: Min assist Stair Management: No rails Number of Stairs: 1(3 attempts) General stair comments: pt required handheld assist for safety, pt educated on going up with stronger leg  Wheelchair Mobility    Modified Rankin (Stroke Patients Only)       Balance Overall balance assessment: Mild deficits observed, not formally tested                                           Pertinent Vitals/Pain Pain Assessment: No/denies pain    Home Living Family/patient expects to be discharged to:: Private residence Living Arrangements: Alone Available Help at Discharge: Family;Available 24 hours/day Type of Home: House Home Access: Level entry   Entrance Stairs-Number of Steps: (1 step between rooms) Home Layout: One level Home Equipment: Cane - single point;Walker - 2 wheels;Walker - 4 wheels;Bedside commode      Prior Function Level of Independence: Independent               Hand Dominance   Dominant Hand: Right    Extremity/Trunk Assessment   Upper Extremity Assessment Upper Extremity Assessment: Defer to OT evaluation    Lower Extremity Assessment Lower Extremity Assessment: Overall WFL for tasks assessed    Cervical / Trunk Assessment Cervical / Trunk Assessment:  Normal Cervical / Trunk Exceptions: s/p spinal surgery  Communication   Communication: HOH  Cognition Arousal/Alertness: Awake/alert Behavior During Therapy: WFL for tasks assessed/performed Overall Cognitive Status: Within  Functional Limits for tasks assessed                                 General Comments: decreased recall of back precautions and brace mgmt       General Comments      Exercises     Assessment/Plan    PT Assessment Patient needs continued PT services  PT Problem List Decreased strength;Decreased safety awareness;Decreased knowledge of precautions;Decreased mobility       PT Treatment Interventions DME instruction;Functional mobility training;Patient/family education;Gait training;Therapeutic activities;Stair training;Therapeutic exercise;Balance training    PT Goals (Current goals can be found in the Care Plan section)  Acute Rehab PT Goals Patient Stated Goal: return home PT Goal Formulation: With patient Time For Goal Achievement: 03/05/18 Potential to Achieve Goals: Good    Frequency Min 5X/week   Barriers to discharge   ability to recall and follow precautions     Co-evaluation               AM-PAC PT "6 Clicks" Mobility  Outcome Measure Help needed turning from your back to your side while in a flat bed without using bedrails?: None Help needed moving from lying on your back to sitting on the side of a flat bed without using bedrails?: None Help needed moving to and from a bed to a chair (including a wheelchair)?: None Help needed standing up from a chair using your arms (e.g., wheelchair or bedside chair)?: None Help needed to walk in hospital room?: None Help needed climbing 3-5 steps with a railing? : A Little 6 Click Score: 23    End of Session Equipment Utilized During Treatment: Gait belt;Back brace Activity Tolerance: Patient tolerated treatment well Patient left: in chair;with call bell/phone within reach Nurse Communication: Mobility status PT Visit Diagnosis: Difficulty in walking, not elsewhere classified (R26.2);Other abnormalities of gait and mobility (R26.89)    Time: 2951-8841 PT Time Calculation (min) (ACUTE ONLY): 20  min   Charges:   PT Evaluation $PT Eval Moderate Complexity: [place], [place] [phone]   Bret Stamour 02/19/2018, 9:16 AM

## 2018-02-19 NOTE — Care Management Note (Signed)
Case Management Note  Patient Details  Name: [name] MRN: [id number] Date of Birth: 1945/10/29  Subjective/Objective:   73 yr old gentleman s/p L4-5 lumbar fusion.                   Action/Plan: Case manager contacted patient's sister Letta Median, who is his care provider-[phone], offered choice for [place]. Referral for [facility] was called to Neoma Laming, [place] Liaison. They will contact patient at discharge for start of care.    Expected Discharge Date:  02/19/18               Expected Discharge Plan:  [place]  In-House Referral:  NA  Discharge planning Services  CM Consult  Post Acute Care Choice:  Home Health Choice offered to:  Patient  DME Arranged:  [name] DME Agency:  NA  HH Arranged:  PT, OT, NA HH Agency:  [facility]  Status of Service:  Completed, signed off  If discussed at [facility] of Stay Meetings, dates discussed:    Additional Comments:  Ninfa Meeker, RN 02/19/2018, 10:03 AM

## 2018-02-19 NOTE — Discharge Summary (Signed)
Physician Discharge Summary  Patient ID: [name] MRN: [id number] DOB/AGE: 73/21/1947 73 y.o.  Admit date: 02/18/2018 Discharge date: 02/19/2018  Admission Diagnoses: recurrent hnp    Discharge Diagnoses: same   Discharged Condition: good  Hospital Course: The patient was admitted on 02/18/2018 and taken to the operating room where the patient underwent TLIF L4-5. The patient tolerated the procedure well and was taken to the recovery room and then to the flor in stable condition. The hospital course was routine. There were no complications. The wound remained clean dry and intact. Pt had appropriate back soreness. No complaints of leg pain or new N/T/W. The patient remained afebrile with stable vital signs, and tolerated a regular diet. The patient continued to increase activities, and pain was well controlled with oral pain medications.   Consults: None  Significant Diagnostic Studies:  Results for orders placed or performed during the hospital encounter of 02/07/18  Surgical pcr screen  Result Value Ref Range   MRSA, PCR NEGATIVE NEGATIVE   Staphylococcus aureus NEGATIVE NEGATIVE  Basic metabolic panel  Result Value Ref Range   Sodium 143 135 - 145 mmol/L   Potassium 4.1 3.5 - 5.1 mmol/L   Chloride 109 98 - 111 mmol/L   CO2 27 22 - 32 mmol/L   Glucose, Bld 99 70 - 99 mg/dL   BUN 15 8 - 23 mg/dL   Creatinine, Ser 1.16 0.61 - 1.24 mg/dL   Calcium 9.1 8.9 - 10.3 mg/dL   GFR calc non Af Amer >60 >60 mL/min   GFR calc Af Amer >60 >60 mL/min   Anion gap 7 5 - 15  CBC WITH DIFFERENTIAL  Result Value Ref Range   WBC 6.0 4.0 - 10.5 K/uL   RBC 4.16 (L) 4.22 - 5.81 MIL/uL   Hemoglobin 13.7 13.0 - 17.0 g/dL   HCT 41.2 39.0 - 52.0 %   MCV 99.0 80.0 - 100.0 fL   MCH 32.9 26.0 - 34.0 pg   MCHC 33.3 30.0 - 36.0 g/dL   RDW 13.1 11.5 - 15.5 %   Platelets 154 150 - 400 K/uL   nRBC 0.0 0.0 - 0.2 %   Neutrophils Relative % 64 %   Neutro Abs 3.9 1.7 - 7.7 K/uL   Lymphocytes  Relative 26 %   Lymphs Abs 1.6 0.7 - 4.0 K/uL   Monocytes Relative 8 %   Monocytes Absolute 0.5 0.1 - 1.0 K/uL   Eosinophils Relative 1 %   Eosinophils Absolute 0.1 0.0 - 0.5 K/uL   Basophils Relative 1 %   Basophils Absolute 0.0 0.0 - 0.1 K/uL   Immature Granulocytes 0 %   Abs Immature Granulocytes 0.02 0.00 - 0.07 K/uL  Protime-INR  Result Value Ref Range   Prothrombin Time 13.4 11.4 - 15.2 seconds   INR 1.03   Type and screen [place]  Result Value Ref Range   ABO/RH(D) B POS    Antibody Screen NEG    Sample Expiration 02/21/2018    Extend sample reason      NO TRANSFUSIONS OR PREGNANCY IN THE PAST 3 MONTHS Performed at [facility], [address], [place], [place] [address]   ABO/Rh  Result Value Ref Range   ABO/RH(D)      B POS Performed at [facility] [address] [place], [place] [address]     Chest 2 View  Result Date: 02/07/2018 CLINICAL DATA:  Lumbar HNP EXAM: CHEST - 2 VIEW  COMPARISON:  05/02/2017 FINDINGS: Moderate-sized hiatal hernia. Heart and mediastinal contours are within normal limits. No focal opacities or effusions. No acute bony abnormality. Scarring at the left base. Postoperative changes in the left upper lobe. IMPRESSION: No active cardiopulmonary disease. Moderate sized hiatal hernia. Electronically Signed   By: Rolm Baptise M.D.   On: 02/07/2018 15:40   Dg Lumbar Spine 2-3 Views  Result Date: 02/18/2018 CLINICAL DATA:  73 year old male, fusion L4-5.  Initial encounter. EXAM: LUMBAR SPINE - 2-3 VIEW; DG C-ARM 61-120 MIN Fluoroscopic time: 1 minutes and 2 seconds. COMPARISON:  11/25/2017 lumbar spine MR. FINDINGS: Two intraoperative C-arm views submitted for review after surgery. Transitional vertebra previously labeled S1. The full extent of the S1-2 rudimentary disc space is not entirely included on lateral view. It appears patient is undergone L4-5 fusion with interbody spacer, bilateral pedicle screws and posterior  connecting bar without complication noted. This can be assessed on follow-up. IMPRESSION: Fusion L4-5 as noted above. Electronically Signed   By: Genia Del M.D.   On: 02/18/2018 12:51   Dg C-arm 1-60 Min  Result Date: 02/18/2018 CLINICAL DATA:  73 year old male, fusion L4-5.  Initial encounter. EXAM: LUMBAR SPINE - 2-3 VIEW; DG C-ARM 61-120 MIN Fluoroscopic time: 1 minutes and 2 seconds. COMPARISON:  11/25/2017 lumbar spine MR. FINDINGS: Two intraoperative C-arm views submitted for review after surgery. Transitional vertebra previously labeled S1. The full extent of the S1-2 rudimentary disc space is not entirely included on lateral view. It appears patient is undergone L4-5 fusion with interbody spacer, bilateral pedicle screws and posterior connecting bar without complication noted. This can be assessed on follow-up. IMPRESSION: Fusion L4-5 as noted above. Electronically Signed   By: Genia Del M.D.   On: 02/18/2018 12:51    Antibiotics:  Anti-infectives (From admission, onward)   Start     Dose/Rate Route Frequency Ordered Stop   02/18/18 1400  ceFAZolin (ANCEF) IVPB 2g/100 mL premix     2 g 200 mL/hr over 30 Minutes Intravenous Every 8 hours 02/18/18 1351 02/18/18 2143   02/18/18 1119  vancomycin (VANCOCIN) powder  Status:  Discontinued       As needed 02/18/18 1119 02/18/18 1250   02/18/18 1112  bacitracin 50,000 Units in sodium chloride 0.9 % 500 mL irrigation  Status:  Discontinued       As needed 02/18/18 1112 02/18/18 1250   02/18/18 0915  ceFAZolin (ANCEF) IVPB 2g/100 mL premix     2 g 200 mL/hr over 30 Minutes Intravenous On call to O.R. 02/18/18 0902 02/18/18 1035      Discharge Exam: Blood pressure (!) 162/88, pulse 65, temperature 97.9 F (36.6 C), temperature source Oral, resp. rate 18, height 5\' 10"  (1.778 m), weight 77.1 kg, SpO2 98 %. Neurologic: Grossly normal Dressing dry  Discharge Medications:   Allergies as of 02/19/2018      Reactions   Ropinirole Hcl  Other (See Comments)   Confusion, agiitation, disorientation, Hallucinations      Medication List    TAKE these medications   aspirin 81 MG tablet Take 1 tablet (81 mg total) by mouth daily.   CoQ10 100 MG Caps Take 300 mg by mouth at bedtime.   divalproex 500 MG 24 hr tablet Commonly known as:  DEPAKOTE ER Take 1,000 mg by mouth at bedtime.   Fish Oil 1000 MG Caps Take 1,000 mg by mouth daily.   omeprazole 40 MG capsule Commonly known as:  PRILOSEC TAKE 1 CAPSULE BY MOUTH EVERY  DAY What changed:  how much to take   simvastatin 40 MG tablet Commonly known as:  ZOCOR TAKE 1 TABLET (40 MG TOTAL) BY MOUTH DAILY AT 6 PM.   ziprasidone 40 MG capsule Commonly known as:  GEODON Take 40 mg by mouth daily with supper.            Durable Medical Equipment  (From admission, onward)         Start     Ordered   02/18/18 1352  DME Walker rolling  Once    Question:  Patient needs a walker to treat with the following condition  Answer:  S/P lumbar fusion   02/18/18 1351   02/18/18 1352  DME 3 n 1  Once     02/18/18 1351          Disposition: home   Final Dx: TLIF L4-5  Discharge Instructions     Remove dressing in 72 hours   Complete by:  As directed    Call MD for:  difficulty breathing, headache or visual disturbances   Complete by:  As directed    Call MD for:  persistant nausea and vomiting   Complete by:  As directed    Call MD for:  redness, tenderness, or signs of infection (pain, swelling, redness, odor or green/yellow discharge around incision site)   Complete by:  As directed    Call MD for:  severe uncontrolled pain   Complete by:  As directed    Call MD for:  temperature >100.4   Complete by:  As directed    Diet - low sodium heart healthy   Complete by:  As directed    Increase activity slowly   Complete by:  As directed       Follow-up Information    Eustace Moore, MD. Schedule an appointment as soon as possible for a visit in 2 week(s).    Specialty:  Neurosurgery Contact information: [address] [place] [address] [place] [address] [phone]            Signed: Eustace Moore 02/19/2018, 7:30 AM

## 2018-02-20 ENCOUNTER — Encounter (HOSPITAL_COMMUNITY): Payer: Self-pay | Admitting: Neurological Surgery

## 2018-02-21 DIAGNOSIS — F429 Obsessive-compulsive disorder, unspecified: Secondary | ICD-10-CM | POA: Diagnosis not present

## 2018-02-21 DIAGNOSIS — Z7982 Long term (current) use of aspirin: Secondary | ICD-10-CM | POA: Diagnosis not present

## 2018-02-21 DIAGNOSIS — G2581 Restless legs syndrome: Secondary | ICD-10-CM | POA: Diagnosis not present

## 2018-02-21 DIAGNOSIS — J439 Emphysema, unspecified: Secondary | ICD-10-CM | POA: Diagnosis not present

## 2018-02-21 DIAGNOSIS — M5126 Other intervertebral disc displacement, lumbar region: Secondary | ICD-10-CM | POA: Diagnosis not present

## 2018-02-21 DIAGNOSIS — Z87891 Personal history of nicotine dependence: Secondary | ICD-10-CM | POA: Diagnosis not present

## 2018-02-21 DIAGNOSIS — Z981 Arthrodesis status: Secondary | ICD-10-CM | POA: Diagnosis not present

## 2018-02-21 DIAGNOSIS — F29 Unspecified psychosis not due to a substance or known physiological condition: Secondary | ICD-10-CM | POA: Diagnosis not present

## 2018-02-21 DIAGNOSIS — M5416 Radiculopathy, lumbar region: Secondary | ICD-10-CM | POA: Diagnosis not present

## 2018-02-21 DIAGNOSIS — Z9181 History of falling: Secondary | ICD-10-CM | POA: Diagnosis not present

## 2018-02-21 DIAGNOSIS — Z4789 Encounter for other orthopedic aftercare: Secondary | ICD-10-CM | POA: Diagnosis not present

## 2018-02-21 DIAGNOSIS — M19041 Primary osteoarthritis, right hand: Secondary | ICD-10-CM | POA: Diagnosis not present

## 2018-02-21 DIAGNOSIS — M19042 Primary osteoarthritis, left hand: Secondary | ICD-10-CM | POA: Diagnosis not present

## 2018-02-21 DIAGNOSIS — G629 Polyneuropathy, unspecified: Secondary | ICD-10-CM | POA: Diagnosis not present

## 2018-02-28 ENCOUNTER — Telehealth: Payer: Self-pay | Admitting: Family Medicine

## 2018-02-28 NOTE — Telephone Encounter (Signed)
I called the patient to schedule AWV, but after dialing the number, I saw a note stating that he had already declined.  So I hung up before anyone answered. VDM (DD)

## 2018-03-06 ENCOUNTER — Telehealth: Payer: Self-pay

## 2018-03-06 NOTE — Telephone Encounter (Signed)
Spoke to sister, regarding message from [place]. We scheduled f/u apt with Dr. Juanell Fairly for 05/08/18. I will let Shawn know so we can get the CT scan before.

## 2018-03-06 NOTE — Telephone Encounter (Signed)
Yes, we will have his CT on 4/25 or 4/27. Thank you

## 2018-03-06 NOTE — Telephone Encounter (Signed)
-----   Message from Lieutenant Diego, RN sent at 03/06/2018 12:17 PM EST ----- Regarding: appt reschedule Hey, [name] was scheduled for a lung screening scan next week by Dr. Juanell Fairly. However, the imaging center called me because it has not been a year from his last one. They cancelled the scan for next week and I'll be contacting and scheduling him for this annual scan on or after 05/04/18.   His sister, [name] handles his appts and she asked that his follow up appt with Dr. Juanell Fairly be changed to after this scan as well.   Can you please look into that and contact her at [phone]?  Let me know if I can help, thanks, [facility]

## 2018-03-11 DIAGNOSIS — G629 Polyneuropathy, unspecified: Secondary | ICD-10-CM | POA: Diagnosis not present

## 2018-03-11 DIAGNOSIS — Z4789 Encounter for other orthopedic aftercare: Secondary | ICD-10-CM | POA: Diagnosis not present

## 2018-03-11 DIAGNOSIS — F429 Obsessive-compulsive disorder, unspecified: Secondary | ICD-10-CM | POA: Diagnosis not present

## 2018-03-11 DIAGNOSIS — Z9181 History of falling: Secondary | ICD-10-CM | POA: Diagnosis not present

## 2018-03-11 DIAGNOSIS — F29 Unspecified psychosis not due to a substance or known physiological condition: Secondary | ICD-10-CM | POA: Diagnosis not present

## 2018-03-11 DIAGNOSIS — Z7982 Long term (current) use of aspirin: Secondary | ICD-10-CM | POA: Diagnosis not present

## 2018-03-11 DIAGNOSIS — G2581 Restless legs syndrome: Secondary | ICD-10-CM | POA: Diagnosis not present

## 2018-03-11 DIAGNOSIS — M19042 Primary osteoarthritis, left hand: Secondary | ICD-10-CM | POA: Diagnosis not present

## 2018-03-11 DIAGNOSIS — M19041 Primary osteoarthritis, right hand: Secondary | ICD-10-CM | POA: Diagnosis not present

## 2018-03-11 DIAGNOSIS — M5416 Radiculopathy, lumbar region: Secondary | ICD-10-CM | POA: Diagnosis not present

## 2018-03-11 DIAGNOSIS — Z87891 Personal history of nicotine dependence: Secondary | ICD-10-CM | POA: Diagnosis not present

## 2018-03-11 DIAGNOSIS — J439 Emphysema, unspecified: Secondary | ICD-10-CM | POA: Diagnosis not present

## 2018-03-11 DIAGNOSIS — M5126 Other intervertebral disc displacement, lumbar region: Secondary | ICD-10-CM | POA: Diagnosis not present

## 2018-03-11 DIAGNOSIS — Z981 Arthrodesis status: Secondary | ICD-10-CM | POA: Diagnosis not present

## 2018-03-13 ENCOUNTER — Ambulatory Visit: Admission: RE | Admit: 2018-03-13 | Payer: PPO | Source: Ambulatory Visit

## 2018-03-26 ENCOUNTER — Ambulatory Visit: Payer: PPO | Admitting: Internal Medicine

## 2018-03-26 DIAGNOSIS — F3172 Bipolar disorder, in full remission, most recent episode hypomanic: Secondary | ICD-10-CM | POA: Diagnosis not present

## 2018-03-30 ENCOUNTER — Encounter: Payer: Self-pay | Admitting: *Deleted

## 2018-04-04 ENCOUNTER — Encounter: Payer: Self-pay | Admitting: *Deleted

## 2018-04-04 DIAGNOSIS — M532X6 Spinal instabilities, lumbar region: Secondary | ICD-10-CM | POA: Diagnosis not present

## 2018-05-08 ENCOUNTER — Ambulatory Visit: Payer: PPO | Admitting: Internal Medicine

## 2018-06-05 DIAGNOSIS — H903 Sensorineural hearing loss, bilateral: Secondary | ICD-10-CM | POA: Diagnosis not present

## 2018-06-07 ENCOUNTER — Telehealth: Payer: Self-pay | Admitting: *Deleted

## 2018-06-07 DIAGNOSIS — Z122 Encounter for screening for malignant neoplasm of respiratory organs: Secondary | ICD-10-CM

## 2018-06-07 DIAGNOSIS — Z87891 Personal history of nicotine dependence: Secondary | ICD-10-CM

## 2018-06-07 NOTE — Telephone Encounter (Signed)
Patient has been notified that annual lung cancer screening low dose CT scan is due currently or will be in near future. Confirmed that patient is within the age range of 55-77, and asymptomatic, (no signs or symptoms of lung cancer). Patient denies illness that would prevent curative treatment for lung cancer if found. Verified smoking history, (current, 51.5 pack year). The shared decision making visit was done 08/10/15. Patient is agreeable for CT scan being scheduled.

## 2018-06-14 ENCOUNTER — Ambulatory Visit
Admission: RE | Admit: 2018-06-14 | Discharge: 2018-06-14 | Disposition: A | Discharge status: Home / Self Care | Payer: PPO | Source: Ambulatory Visit | Attending: Oncology | Admitting: Oncology

## 2018-06-14 ENCOUNTER — Other Ambulatory Visit: Payer: Self-pay

## 2018-06-14 DIAGNOSIS — F17210 Nicotine dependence, cigarettes, uncomplicated: Secondary | ICD-10-CM | POA: Diagnosis not present

## 2018-06-14 DIAGNOSIS — Z87891 Personal history of nicotine dependence: Secondary | ICD-10-CM | POA: Diagnosis not present

## 2018-06-14 DIAGNOSIS — Z122 Encounter for screening for malignant neoplasm of respiratory organs: Secondary | ICD-10-CM | POA: Diagnosis not present

## 2018-06-17 NOTE — Progress Notes (Signed)
Do you need me to do anything for follow-up for this patient?

## 2018-06-19 DIAGNOSIS — H903 Sensorineural hearing loss, bilateral: Secondary | ICD-10-CM | POA: Diagnosis not present

## 2018-06-20 ENCOUNTER — Telehealth: Payer: Self-pay | Admitting: *Deleted

## 2018-06-20 NOTE — Telephone Encounter (Signed)
Notified patient's sister at the patient's request of LDCT lung cancer screening program results with recommendation for 3 month follow up imaging. Also notified of incidental findings noted below and is encouraged to discuss further with PCP who will receive a copy of this note and/or the CT report. Patient's sister verbalizes understanding.   IMPRESSION: 1. Stable exam without new suspicious pulmonary nodule or mass. No progressive pulmonary nodule or mass. Lung-RADS 2, benign appearance or behavior. Continue annual screening with low-dose chest CT without contrast in 12 months. 2. Posterior right lower lobe cavitary nodule identified as new on the previous study is stable in the 3 month interval since that exam. 3. Calcified spiculated left upper lobe pulmonary nodule has been followed since 2017 and is unchanged since the most recent comparison screening CT. Previous biopsy of this nodule documented features most consistent with abscess. 4.  Emphysema. (ICD10-J43.9) 5.  Aortic Atherosclerois (ICD10-170.0) 6. Small to moderate hiatal hernia.

## 2018-06-24 ENCOUNTER — Encounter: Payer: Self-pay | Admitting: Family Medicine

## 2018-06-24 DIAGNOSIS — I700 Atherosclerosis of aorta: Secondary | ICD-10-CM | POA: Insufficient documentation

## 2018-07-01 DIAGNOSIS — F3172 Bipolar disorder, in full remission, most recent episode hypomanic: Secondary | ICD-10-CM | POA: Diagnosis not present

## 2018-07-02 DIAGNOSIS — M961 Postlaminectomy syndrome, not elsewhere classified: Secondary | ICD-10-CM | POA: Diagnosis not present

## 2018-07-02 DIAGNOSIS — M532X6 Spinal instabilities, lumbar region: Secondary | ICD-10-CM | POA: Diagnosis not present

## 2018-07-08 DIAGNOSIS — Z961 Presence of intraocular lens: Secondary | ICD-10-CM | POA: Diagnosis not present

## 2018-07-08 DIAGNOSIS — H3554 Dystrophies primarily involving the retinal pigment epithelium: Secondary | ICD-10-CM | POA: Diagnosis not present

## 2018-07-08 DIAGNOSIS — H35723 Serous detachment of retinal pigment epithelium, bilateral: Secondary | ICD-10-CM | POA: Diagnosis not present

## 2018-07-08 DIAGNOSIS — H35363 Drusen (degenerative) of macula, bilateral: Secondary | ICD-10-CM | POA: Diagnosis not present

## 2018-07-08 DIAGNOSIS — H35451 Secondary pigmentary degeneration, right eye: Secondary | ICD-10-CM | POA: Diagnosis not present

## 2018-07-25 DIAGNOSIS — H25813 Combined forms of age-related cataract, bilateral: Secondary | ICD-10-CM | POA: Diagnosis not present

## 2018-07-25 DIAGNOSIS — H35723 Serous detachment of retinal pigment epithelium, bilateral: Secondary | ICD-10-CM | POA: Diagnosis not present

## 2018-07-25 DIAGNOSIS — H3554 Dystrophies primarily involving the retinal pigment epithelium: Secondary | ICD-10-CM | POA: Diagnosis not present

## 2018-07-25 DIAGNOSIS — H35451 Secondary pigmentary degeneration, right eye: Secondary | ICD-10-CM | POA: Diagnosis not present

## 2018-07-25 DIAGNOSIS — H35363 Drusen (degenerative) of macula, bilateral: Secondary | ICD-10-CM | POA: Diagnosis not present

## 2018-08-07 ENCOUNTER — Other Ambulatory Visit: Payer: Self-pay

## 2018-09-17 DIAGNOSIS — F3172 Bipolar disorder, in full remission, most recent episode hypomanic: Secondary | ICD-10-CM | POA: Diagnosis not present

## 2018-09-18 ENCOUNTER — Telehealth: Payer: Self-pay | Admitting: *Deleted

## 2018-09-18 ENCOUNTER — Other Ambulatory Visit: Payer: Self-pay | Admitting: *Deleted

## 2018-09-18 DIAGNOSIS — R918 Other nonspecific abnormal finding of lung field: Secondary | ICD-10-CM

## 2018-09-18 DIAGNOSIS — Z122 Encounter for screening for malignant neoplasm of respiratory organs: Secondary | ICD-10-CM

## 2018-09-18 DIAGNOSIS — Z87891 Personal history of nicotine dependence: Secondary | ICD-10-CM

## 2018-09-18 NOTE — Telephone Encounter (Signed)
Contacted sister and scheduled for LCS nodule follow up

## 2018-09-20 ENCOUNTER — Other Ambulatory Visit: Payer: Self-pay | Admitting: Nurse Practitioner

## 2018-09-20 ENCOUNTER — Ambulatory Visit
Admission: RE | Admit: 2018-09-20 | Discharge: 2018-09-20 | Disposition: A | Discharge status: Home / Self Care | Payer: PPO | Source: Ambulatory Visit | Attending: Nurse Practitioner | Admitting: Nurse Practitioner

## 2018-09-20 ENCOUNTER — Other Ambulatory Visit: Payer: Self-pay

## 2018-09-20 DIAGNOSIS — Z87891 Personal history of nicotine dependence: Secondary | ICD-10-CM | POA: Insufficient documentation

## 2018-09-20 DIAGNOSIS — R918 Other nonspecific abnormal finding of lung field: Secondary | ICD-10-CM | POA: Diagnosis not present

## 2018-09-20 DIAGNOSIS — Z122 Encounter for screening for malignant neoplasm of respiratory organs: Secondary | ICD-10-CM

## 2018-09-20 DIAGNOSIS — J439 Emphysema, unspecified: Secondary | ICD-10-CM | POA: Diagnosis not present

## 2018-09-20 DIAGNOSIS — F3172 Bipolar disorder, in full remission, most recent episode hypomanic: Secondary | ICD-10-CM | POA: Diagnosis not present

## 2018-09-20 DIAGNOSIS — J9809 Other diseases of bronchus, not elsewhere classified: Secondary | ICD-10-CM | POA: Diagnosis not present

## 2018-09-20 NOTE — Progress Notes (Signed)
[name], 73 y.o. male who was referred to the lung cancer screening program/low-dose CT for current use of tobacco products.  he had a low-dose chest CT on 09/20/2018 which noted enlarging nodule in the right lower lobe and the cavitary portion along the inferior margin of the lesion is now more solid in appearance with volume derived mean diameter of 10.7 mm, posterior right upper lobe nodule has increased substantially in size with volume derived mean diameter 7.5 mm concerning for primary bronchogenic carcinoma/underlying malignancy.  he will require PET scan to further evaluate and assist in medical decision-making including possible biopsy and to optimize management. Patient has been referred to Dr. Genevive Bi.

## 2018-09-24 ENCOUNTER — Telehealth: Payer: Self-pay | Admitting: *Deleted

## 2018-09-24 DIAGNOSIS — R911 Solitary pulmonary nodule: Secondary | ICD-10-CM

## 2018-09-24 DIAGNOSIS — Z122 Encounter for screening for malignant neoplasm of respiratory organs: Secondary | ICD-10-CM

## 2018-09-24 NOTE — Telephone Encounter (Signed)
After reviewing lung screening scan with Beckey Rutter NP, Thoracic Surgery, and thoracic navigator, patient and sister notified of results including recommendation for PET scan and follow up with Dr. [name]. Patient is in agreement with this plan.   IMPRESSION: 1. Lung-RADS 4BS, suspicious. Additional imaging evaluation or consultation with Pulmonology or Thoracic Surgery recommended. 2. The "S" modifier above refers to potentially clinically significant non lung cancer related findings. Specifically, there is aortic atherosclerosis, in addition to three-vessel coronary artery disease. Assessment for potential risk factor modification, dietary therapy or pharmacologic therapy may be warranted, if clinically indicated. 3. Mild diffuse bronchial wall thickening with mild centrilobular and paraseptal emphysema; imaging findings suggestive of underlying COPD.  These results will be called to the ordering clinician or representative by the Radiologist Assistant, and communication documented in the PACS or zVision Dashboard.  Aortic Atherosclerosis (ICD10-I70.0) and Emphysema (ICD10-J43.9).

## 2018-10-10 ENCOUNTER — Other Ambulatory Visit: Payer: Self-pay

## 2018-10-10 ENCOUNTER — Encounter: Payer: Self-pay | Admitting: Cardiothoracic Surgery

## 2018-10-10 ENCOUNTER — Ambulatory Visit (INDEPENDENT_AMBULATORY_CARE_PROVIDER_SITE_OTHER): Payer: PPO | Admitting: Cardiothoracic Surgery

## 2018-10-10 ENCOUNTER — Encounter
Admission: RE | Admit: 2018-10-10 | Discharge: 2018-10-10 | Disposition: A | Discharge status: Home / Self Care | Payer: PPO | Source: Ambulatory Visit | Attending: Nurse Practitioner | Admitting: Nurse Practitioner

## 2018-10-10 VITALS — BP 132/77 | HR 56 | Temp 97.7°F | Resp 16 | Ht 70.0 in | Wt 151.4 lb

## 2018-10-10 DIAGNOSIS — Z7982 Long term (current) use of aspirin: Secondary | ICD-10-CM | POA: Insufficient documentation

## 2018-10-10 DIAGNOSIS — Z122 Encounter for screening for malignant neoplasm of respiratory organs: Secondary | ICD-10-CM | POA: Diagnosis not present

## 2018-10-10 DIAGNOSIS — R918 Other nonspecific abnormal finding of lung field: Secondary | ICD-10-CM | POA: Diagnosis not present

## 2018-10-10 DIAGNOSIS — Z79899 Other long term (current) drug therapy: Secondary | ICD-10-CM | POA: Insufficient documentation

## 2018-10-10 DIAGNOSIS — R911 Solitary pulmonary nodule: Secondary | ICD-10-CM | POA: Diagnosis not present

## 2018-10-10 DIAGNOSIS — J852 Abscess of lung without pneumonia: Secondary | ICD-10-CM

## 2018-10-10 DIAGNOSIS — F17210 Nicotine dependence, cigarettes, uncomplicated: Secondary | ICD-10-CM | POA: Insufficient documentation

## 2018-10-10 LAB — GLUCOSE, CAPILLARY: Glucose-Capillary: 67 mg/dL — ABNORMAL LOW (ref 70–99)

## 2018-10-10 MED ORDER — FLUDEOXYGLUCOSE F - 18 (FDG) INJECTION
7.8000 | Freq: Once | INTRAVENOUS | Status: AC | PRN
  Administered 2018-10-10: 12:00:00 8.37 via INTRAVENOUS

## 2018-10-10 NOTE — Progress Notes (Signed)
Patient ID: [name], male   DOB: [birth year], 73 y.o.   MRN: [id number]  Chief Complaint  Patient presents with  . New Patient (Initial Visit)    Lung Nodule    Referred By Dr. Lelon Huh Reason for Referral right lung lesions  HPI Location, Quality, Duration, Severity, Timing, Context, Modifying Factors, Associated Signs and Symptoms.  [name] is a 73 y.o. male.  He is status post left thoracotomy and left upper lobe resection for a infectious process about 15 years ago.  He has had extensive follow-up over the years including multiple CT scans of the chest as he continues to smoke at least a half a pack cigarettes a day.  He had a CT scan performed earlier this year which showed 2 lesions in the right lower lobe that were juxtaposed as well as a new right upper lobe lesion.  A short-term follow-up in 3 months confirmed that these nodules had increased slightly in size and were concerning for malignancy.  The patient then underwent a PET scan which showed mild uptake in both of these areas suggesting the possibility of a malignancy.  Of note is that he has had multiple infections in his lungs over the years and as previously mentioned he also continues to smoke.  He states he does have a cough which is occasionally productive.  He gets short of breath with moderate activity.  He is not had any recent pulmonary function studies.  The most recent ones were spirometry only in 2017 and were fairly reasonable at that time.  He comes in today for my evaluation.   Past Medical History:  Diagnosis Date  . Arthritis    in his fingers  . Emphysema   . Episodic confusion    Possible bipolar disorder  . GERD (gastroesophageal reflux disease)   . History of chicken pox   . HNP (herniated nucleus pulposus), lumbar   . Memory deficit 07/31/2012  . OCD (obsessive compulsive disorder)   . Peripheral neuropathy   . Restless leg syndrome 01/10/2003  . Unspecified psychosis 03/01/2012     Past Surgical History:  Procedure Laterality Date  . liver excision     for non-cancerous mass  . LUMBAR LAMINECTOMY/DECOMPRESSION MICRODISCECTOMY Left 06/22/2017   Procedure: Extraforaminal Microdiscectomy  - L4-L5 - left;  Surgeon: Eustace Moore, MD;  Location: [facility];  Service: Neurosurgery;  Laterality: Left;  . LUNG SURGERY  2005   Lipoma Removal  . MAXIMUM ACCESS (MAS) TRANSFORAMINAL LUMBAR INTERBODY FUSION (TLIF) 1 LEVEL [name] 02/18/2018   Procedure: Transforaminal Lumbar Interbody Fusion - Lumbar Four - Lumbar Five;  Surgeon: Eustace Moore, MD;  Location: [facility];  Service: Neurosurgery;  Laterality: [name];  Transforaminal Lumbar Interbody Fusion - Lumbar Four - Lumbar Five    Family History  Problem Relation Age of Onset  . Heart disease Mother   . Cancer Father        lung  . Heart disease Father   . Diabetes Sister   . Heart disease Sister   . Seizures Paternal Grandfather     Social History Social History   Tobacco Use  . Smoking status: Current Every Day Smoker    Packs/day: 0.75    Years: 50.00    Pack years: 37.50    Types: Cigarettes    Last attempt to quit: 01/09/2018    Years since quitting: 0.7  . Smokeless tobacco: Never Used  Substance Use Topics  . Alcohol use: No  .  Drug use: No    Allergies  Allergen Reactions  . Ropinirole Hcl Other (See Comments)    Confusion, agiitation, disorientation, Hallucinations    Current Outpatient Medications  Medication Sig Dispense Refill  . aspirin 81 MG tablet Take 1 tablet (81 mg total) by mouth daily.    . Coenzyme Q10 (COQ10) 100 MG CAPS Take 300 mg by mouth at bedtime.     . divalproex (DEPAKOTE ER) 500 MG 24 hr tablet Take 1,000 mg by mouth at bedtime.     . Omega-3 Fatty Acids (FISH OIL) 1000 MG CAPS Take 1,000 mg by mouth daily.    [name] omeprazole (PRILOSEC) 40 MG capsule TAKE 1 CAPSULE BY MOUTH EVERY DAY (Patient taking differently: Take 40 mg by mouth daily. ) 90 capsule 4  . simvastatin (ZOCOR) 40 MG  tablet TAKE 1 TABLET (40 MG TOTAL) BY MOUTH DAILY AT 6 PM. 90 tablet 4  . ziprasidone (GEODON) 40 MG capsule Take 40 mg by mouth daily with supper.      No current facility-administered medications for this visit.       Review of Systems A complete review of systems was asked and was negative except for the following positive findings shortness of breath with exertion, 20 pound weight loss  Blood pressure 132/77, pulse (!) 56, temperature 97.7 F (36.5 C), resp. rate 16, height 5\' 10"  (1.778 m), weight 151 lb 6.4 oz (68.7 kg), SpO2 97 %.  Physical Exam CONSTITUTIONAL:  Pleasant, well-developed, well-nourished, and in no acute distress. EYES: Pupils equal and reactive to light, Sclera non-icteric EARS, NOSE, MOUTH AND THROAT:  The oropharynx was clear.  Dentition is good repair.  Oral mucosa pink and moist. LYMPH NODES:  Lymph nodes in the neck and axillae were normal RESPIRATORY:  Lungs were clear.  Normal respiratory effort without pathologic use of accessory muscles of respiration CARDIOVASCULAR: Heart was regular without murmurs.  There were no carotid bruits. GI: The abdomen was soft, nontender, and nondistended. There were no palpable masses. There was no hepatosplenomegaly. There were normal bowel sounds in all quadrants. GU:  Rectal deferred.   MUSCULOSKELETAL:  Normal muscle strength and tone.  No clubbing or cyanosis.   SKIN:  There were no pathologic skin lesions.  There were no nodules on palpation. NEUROLOGIC:  Sensation is normal.  Cranial nerves are grossly intact. PSYCH:  Oriented to person, place and time.  Mood and affect are normal.  Data Reviewed Multiple CT scans and PET scans  I have personally reviewed the patient's imaging, laboratory findings and medical records.    Assessment    I have independently reviewed the CT scan and PET scans.  There is a lesion in the right upper lobe which measures about 8 mm.  There are 2 adjacent lesions in the right lower lobe  which measure about 10 mm in total.  These also are concerning for malignancy.    Plan    I had a long discussion with he and his wife today.  I did encourage him to stop smoking.  I also discussed with them the possibility of performing a thoracotomy for biopsy and or therapeutic wedge resection.  We also discussed the role of antibiotic therapy.  They are desirous of a nonoperative approach at this time and I would like to make an appointment for them to follow-up with 1 of our pulmonary medicine specialists to assist me in managing their underlying lung disease as well as coming to a specific diagnosis.  I do think that the lesions would be amenable to wedge resection only.  Given the extent of his underlying COPD as well as his continued tobacco use I do not think he would be a candidate for more aggressive surgical resection.  He understands all this.  We will go ahead and set him up to see our pulmonary medicine colleagues and get a complete set of pulmonary function studies and I will see him back again in 2 weeks.       [name], MD 10/10/2018, 3:05 PM

## 2018-10-10 NOTE — Patient Instructions (Addendum)
We will refer you to pulmonology. They will call you to schedule that. Call us if you do not hear back from them by Tuesday.   We will set you up for breathing studies. We will call you with that information.   We would you to stop smoking. Follow up here in 2 weeks to discuss treatment.

## 2018-10-11 ENCOUNTER — Ambulatory Visit: Payer: Self-pay | Admitting: Cardiothoracic Surgery

## 2018-10-11 ENCOUNTER — Telehealth: Payer: Self-pay

## 2018-10-11 NOTE — Telephone Encounter (Signed)
Spoke with the patient's sister, Jamal Maes, to go over Pulmonary function testing date.  The patient is scheduled for [facility] Function testing at [facility] on 10/22/18 at 2:15 pm. He will arrive by 2:00 pm and enter in through the [facility]  He will have Covid-19 testing done on 10/18/18 at the [facility] between 11 am-1 pm.   He is aware of dates, times, and instructions.

## 2018-10-18 ENCOUNTER — Other Ambulatory Visit: Admission: RE | Admit: 2018-10-18 | Payer: PPO | Source: Ambulatory Visit

## 2018-10-21 ENCOUNTER — Other Ambulatory Visit: Payer: Self-pay

## 2018-10-21 ENCOUNTER — Other Ambulatory Visit
Admission: RE | Admit: 2018-10-21 | Discharge: 2018-10-21 | Disposition: A | Discharge status: Home / Self Care | Payer: PPO | Source: Ambulatory Visit | Attending: Cardiothoracic Surgery | Admitting: Cardiothoracic Surgery

## 2018-10-21 DIAGNOSIS — Z01812 Encounter for preprocedural laboratory examination: Secondary | ICD-10-CM | POA: Insufficient documentation

## 2018-10-21 DIAGNOSIS — Z20828 Contact with and (suspected) exposure to other viral communicable diseases: Secondary | ICD-10-CM | POA: Diagnosis not present

## 2018-10-21 LAB — SARS CORONAVIRUS 2 (TAT 6-24 HRS): SARS Coronavirus 2: NEGATIVE

## 2018-10-22 ENCOUNTER — Telehealth: Payer: Self-pay | Admitting: Family Medicine

## 2018-10-22 ENCOUNTER — Ambulatory Visit: Payer: PPO | Attending: Cardiothoracic Surgery

## 2018-10-22 DIAGNOSIS — F17210 Nicotine dependence, cigarettes, uncomplicated: Secondary | ICD-10-CM | POA: Diagnosis not present

## 2018-10-22 DIAGNOSIS — R911 Solitary pulmonary nodule: Secondary | ICD-10-CM | POA: Diagnosis not present

## 2018-10-22 DIAGNOSIS — J449 Chronic obstructive pulmonary disease, unspecified: Secondary | ICD-10-CM | POA: Insufficient documentation

## 2018-10-22 LAB — BLOOD GAS, ARTERIAL
Acid-Base Excess: 4.5 mmol/L — ABNORMAL HIGH (ref 0.0–2.0)
Bicarbonate: 28.4 mmol/L — ABNORMAL HIGH (ref 20.0–28.0)
FIO2: 0.21
O2 Saturation: 98.2 %
Patient temperature: 37
pCO2 arterial: 39 mmHg (ref 32.0–48.0)
pH, Arterial: 7.47 — ABNORMAL HIGH (ref 7.350–7.450)
pO2, Arterial: 101 mmHg (ref 83.0–108.0)

## 2018-10-22 NOTE — Chronic Care Management (AMB) (Signed)
Chronic Care Management   Note  10/22/2018 Name: [name] MRN: [id number] DOB: January 06, 1946  [name] is a 73 y.o. year old male who is a primary care patient of Fisher, Kirstie Peri, MD. I reached out to [name] by phone today in response to a referral sent by Mr. KENZIE FLAKES health plan.     Mr. Slovacek was given information about Chronic Care Management services today including:  1. CCM service includes personalized support from designated clinical staff supervised by his physician, including individualized plan of care and coordination with other care providers 2. 24/7 contact phone numbers for assistance for urgent and routine care needs. 3. Service will only be billed when office clinical staff spend 20 minutes or more in a month to coordinate care. 4. Only one practitioner may furnish and bill the service in a calendar month. 5. The patient may stop CCM services at any time (effective at the end of the month) by phone call to the office staff. 6. The patient will be responsible for cost sharing (co-pay) of up to 20% of the service fee (after annual deductible is met).  Patients sister Massie Maroon did not agree to enrollment in care management services (Due to copay) and does not wish to consider at this time.  Follow up plan: The patient has been provided with contact information for the chronic care management team and has been advised to call with any health related questions or concerns.   [place]  [email or web address]   ??[phone]

## 2018-10-25 ENCOUNTER — Other Ambulatory Visit: Payer: Self-pay

## 2018-10-25 ENCOUNTER — Ambulatory Visit: Payer: PPO | Admitting: Cardiothoracic Surgery

## 2018-10-25 ENCOUNTER — Encounter: Payer: Self-pay | Admitting: Cardiothoracic Surgery

## 2018-10-25 ENCOUNTER — Telehealth: Payer: Self-pay

## 2018-10-25 DIAGNOSIS — R918 Other nonspecific abnormal finding of lung field: Secondary | ICD-10-CM

## 2018-10-25 NOTE — Patient Instructions (Addendum)
Patient to discuss options with family and to call back with decision.  CT Chest without contrast 12/25/2018 @10 :00 Outpatient imaging.   Please see your follow up appointment listed below.

## 2018-10-25 NOTE — Progress Notes (Signed)
  Patient ID: [name], male   DOB: 1945/07/28, 73 y.o.   MRN: [id number]  HISTORY: He returns today for further follow-up.  He has no new complaints.  He had his pulmonary function studies done and his PET scan done.  He is accompanied today by his wife.  He did quit smoking 8 days ago and we congratulated him on that milestone.  He was encouraged to continue his tobacco cessation.   Vitals:   10/25/18 1307  BP: 117/76  Pulse: (!) 54  Temp: 97.7 F (36.5 C)  SpO2: 98%     EXAM:    Resp: Lungs are clear bilaterally but overall distant.  No respiratory distress, normal effort. Heart:  Regular without murmurs Abd:  Abdomen is soft, non distended and non tender. No masses are palpable.  There is no rebound and no guarding.  Neurological: Alert and oriented to person, place, and time. Coordination normal.  Skin: Skin is warm and dry. No rash noted. No diaphoretic. No erythema. No pallor.  Psychiatric: Normal mood and affect. Normal behavior. Judgment and thought content normal.    ASSESSMENT: I have independently reviewed the x-rays again with the patient and his wife.  There are 3 lesions in the right lung that are about 7 to 8 mm in size.  These have increased slightly over the last several months.  The lower lobe is actually 2 kissing lesions each about 5 to 6 mm.  Within the upper lobe just at the level of the fissure there is a another 6 to 7 mm nodule.  The PET scan showed mild activity in the right lung but nothing in the left lung.   PLAN:   I had a long discussion today with the patient and his wife.  We discussed the options.  I believe that these lesions are too small for bronchoscopy or percutaneous biopsy.  I explained to him that at this point we could continue to monitor the lesions but they have shown some progression over the last several scans and if these were cancers they would be best treated now.  I did review with him the option of thoracotomy and lung  resection.  His PFTs would tolerate a thoracotomy and wedge resection.  He is already had part of his left upper lobe removed and therefore I believe that a sublobar resection for all these lesions on the right would be most likely to result in a good result for the patient.  I did explain to him that occasionally these lesions cannot be felt at the time of surgery.  I also explained to him that sometimes a sublobar resection may result in very close margins with the need for additional therapy after.  I answered all their questions.  They would like to discuss everything with her family but are leaning towards additional follow-up.  I went ahead and made an appointment for them to come back and see me in 2 months time which will be 3 months from his last CT scan.  I answered all their questions today and they seem pleased.    [name], MD

## 2018-10-25 NOTE — Telephone Encounter (Signed)
Spoke with Kim-pulmonary Dr.Kasa's office regarding the referral that was placed 10/10/2018 and patient hasn't been contacted yet. Maudie Mercury stated she would call the patient today to schedule an appointment.

## 2018-11-13 ENCOUNTER — Other Ambulatory Visit: Payer: Self-pay

## 2018-11-13 ENCOUNTER — Encounter: Payer: Self-pay | Admitting: Family Medicine

## 2018-11-13 ENCOUNTER — Ambulatory Visit (INDEPENDENT_AMBULATORY_CARE_PROVIDER_SITE_OTHER): Payer: PPO | Admitting: Family Medicine

## 2018-11-13 VITALS — BP 114/74 | HR 80 | Temp 97.1°F | Ht 70.0 in | Wt 154.4 lb

## 2018-11-13 DIAGNOSIS — Z125 Encounter for screening for malignant neoplasm of prostate: Secondary | ICD-10-CM

## 2018-11-13 DIAGNOSIS — F17210 Nicotine dependence, cigarettes, uncomplicated: Secondary | ICD-10-CM | POA: Diagnosis not present

## 2018-11-13 DIAGNOSIS — E785 Hyperlipidemia, unspecified: Secondary | ICD-10-CM | POA: Diagnosis not present

## 2018-11-13 DIAGNOSIS — R1313 Dysphagia, pharyngeal phase: Secondary | ICD-10-CM

## 2018-11-13 DIAGNOSIS — Z0000 Encounter for general adult medical examination without abnormal findings: Secondary | ICD-10-CM

## 2018-11-13 DIAGNOSIS — E039 Hypothyroidism, unspecified: Secondary | ICD-10-CM | POA: Diagnosis not present

## 2018-11-13 DIAGNOSIS — I700 Atherosclerosis of aorta: Secondary | ICD-10-CM

## 2018-11-13 DIAGNOSIS — J449 Chronic obstructive pulmonary disease, unspecified: Secondary | ICD-10-CM

## 2018-11-13 DIAGNOSIS — E038 Other specified hypothyroidism: Secondary | ICD-10-CM

## 2018-11-13 DIAGNOSIS — F3178 Bipolar disorder, in full remission, most recent episode mixed: Secondary | ICD-10-CM

## 2018-11-13 NOTE — Patient Instructions (Signed)
.   Please review the attached list of medications and notify my office if there are any errors.   . Please bring all of your medications to every appointment so we can make sure that our medication list is the same as yours.   . It is especially important to get the annual flu vaccine this year. If you haven't had it already, please go to your pharmacy or call the office as soon as possible to schedule you flu shot.  

## 2018-11-13 NOTE — Progress Notes (Signed)
Patient: [name], Male    DOB: [birth year], 73 y.o.   MRN: [id number] Visit Date: 11/13/2018  Today's Provider: Lelon Huh, MD   Chief Complaint  Patient presents with  . Medicare Wellness   Subjective:     Annual wellness visit [name] is a 73 y.o. male. He feels . He reports he is sleeping fairly well.  -----------------------------------------------------------     Social History   Socioeconomic History  . Marital status: Single    Spouse name: Not on file  . Number of children: 0  . Years of education: 70  . Highest education level: Not on file  Occupational History  . Occupation: Retired  Scientific laboratory technician  . Financial resource strain: Not on file  . Food insecurity    Worry: Not on file    Inability: Not on file  . Transportation needs    Medical: Not on file    Non-medical: Not on file  Tobacco Use  . Smoking status: Current Every Day Smoker    Packs/day: 0.75    Years: 50.00    Pack years: 37.50    Types: Cigarettes    Last attempt to quit: 01/09/2018    Years since quitting: 0.8  . Smokeless tobacco: Never Used  Substance and Sexual Activity  . Alcohol use: No  . Drug use: No  . Sexual activity: Not on file  Lifestyle  . Physical activity    Days per week: Not on file    Minutes per session: Not on file  . Stress: Not on file  Relationships  . Social Herbalist on phone: Not on file    Gets together: Not on file    Attends religious service: Not on file    Active member of club or organization: Not on file    Attends meetings of clubs or organizations: Not on file    Relationship status: Not on file  . Intimate partner violence    Fear of current or ex partner: Not on file    Emotionally abused: Not on file    Physically abused: Not on file    Forced sexual activity: Not on file  Other Topics Concern  . Not on file  Social History Narrative   Patient lives at home with wife.    Patient is retired.    Patient has no children.    Patient has 11th grade education.     Past Medical History:  Diagnosis Date  . Arthritis    in his fingers  . Emphysema   . Episodic confusion    Possible bipolar disorder  . GERD (gastroesophageal reflux disease)   . History of chicken pox   . HNP (herniated nucleus pulposus), lumbar   . Memory deficit 07/31/2012  . OCD (obsessive compulsive disorder)   . Peripheral neuropathy   . Restless leg syndrome 01/10/2003  . Unspecified psychosis 03/01/2012     Patient Active Problem List   Diagnosis Date Noted  . Aortic atherosclerosis ([place]) 06/24/2018  . S/P lumbar fusion 02/18/2018  . Bipolar disorder ([place]) 08/06/2017  . S/P lumbar laminectomy 06/22/2017  . Coronary atherosclerosis 01/29/2017  . Lung nodule seen on imaging study 01/29/2017  . Maculopathy 12/28/2016  . Patent foramen ovale   . Abscess of upper lobe of left lung without pneumonia ([place]) 09/14/2015  . Smoking greater than 30 pack years 07/19/2015  . Subclinical hypothyroidism 07/17/2014  . Anxiety 07/08/2014  . Dyspnea  on exertion 07/08/2014  . Memory deficit 07/31/2012  . Cerebrovascular disease, unspecified 03/01/2012  . Restless legs syndrome (RLS) 03/01/2012  . Unspecified hereditary and idiopathic peripheral neuropathy 03/01/2012  . COPD (chronic obstructive pulmonary disease) ([place]) 06/14/2010  . BPH (benign prostatic hyperplasia) 06/08/2009  . Late effects of cerebrovascular disease 07/19/2008  . Bipolar I disorder, single manic episode, moderate ([place]) 07/14/2008  . Fam hx-ischem heart disease 07/16/2006  . Hyperlipidemia 01/10/2003  . Herpesviral infection of perianal skin and rectum 01/10/2000    Past Surgical History:  Procedure Laterality Date  . liver excision     for non-cancerous mass  . LUMBAR LAMINECTOMY/DECOMPRESSION MICRODISCECTOMY Left 06/22/2017   Procedure: Extraforaminal Microdiscectomy  - L4-L5 - left;  Surgeon: Eustace Moore, MD;  Location: [facility];  Service:  Neurosurgery;  Laterality: Left;  . LUNG SURGERY  2005   Lipoma Removal  . MAXIMUM ACCESS (MAS) TRANSFORAMINAL LUMBAR INTERBODY FUSION (TLIF) 1 LEVEL [name] 02/18/2018   Procedure: Transforaminal Lumbar Interbody Fusion - Lumbar Four - Lumbar Five;  Surgeon: Eustace Moore, MD;  Location: [facility];  Service: Neurosurgery;  Laterality: [name];  Transforaminal Lumbar Interbody Fusion - Lumbar Four - Lumbar Five    His family history includes Cancer in his father; Diabetes in his sister; Heart disease in his father, mother, and sister; Seizures in his paternal grandfather.   Current Outpatient Medications:  .  aspirin 81 MG tablet, Take 1 tablet (81 mg total) by mouth daily., Disp: , Rfl:  .  benztropine (COGENTIN) 0.5 MG tablet, benztropine 0.5 mg tablet  TAKE 1 TABLET BY MOUTH DAILY WITH DINNER, Disp: , Rfl:  .  Coenzyme Q10 (COQ10) 100 MG CAPS, Take 300 mg by mouth at bedtime. , Disp: , Rfl:  .  divalproex (DEPAKOTE ER) 500 MG 24 hr tablet, Take 1,000 mg by mouth at bedtime. , Disp: , Rfl:  .  Influenza vac split quadrivalent PF (FLUZONE HIGH-DOSE) 0.5 ML injection, Fluzone High-Dose 2018-2019 (PF) 180 mcg/0.5 mL intramuscular syringe  TO BE ADMINISTERED BY PHARMACIST FOR IMMUNIZATION, Disp: , Rfl:  .  Kevin-3 Fatty Acids (FISH OIL) 1000 MG CAPS, Take 1,000 mg by mouth daily., Disp: , Rfl:  .  simvastatin (ZOCOR) 40 MG tablet, TAKE 1 TABLET (40 MG TOTAL) BY MOUTH DAILY AT 6 PM., Disp: 90 tablet, Rfl: 4 .  ziprasidone (GEODON) 40 MG capsule, Take 40 mg by mouth daily with supper. , Disp: , Rfl:   Patient Care Team: Birdie Sons, MD as PCP - General (Family Medicine) Chauncey Mann, MD as Referring Physician (Psychiatry)    Objective:    Vitals: BP 114/74 (BP Location: Left Arm, Patient Position: Sitting, Cuff Size: Normal)   Pulse 80   Temp (!) 97.1 F (36.2 C) (Temporal)   Ht 5\' 10"  (1.778 m)   Wt 154 lb 6.4 oz (70 kg)   SpO2 98%   BMI 22.15 kg/m   Physical Exam  Activities of Daily  Living In your present state of health, do you have any difficulty performing the following activities: 11/13/2018 02/07/2018  Hearing? Y N  Vision? Y N  Difficulty concentrating or making decisions? N N  Walking or climbing stairs? N Y  Comment - 'ALITTLE PROBLEM WALKING UP AND DOWN D/T CURRENT BACK PROBLEM"  Dressing or bathing? N N  Doing errands, shopping? N N  Comment - he can, he doesn't like to  Some recent data might be hidden    Fall Risk Assessment Fall Risk  11/13/2018 10/10/2018 08/06/2017 07/19/2016 07/19/2015  Falls in the past year? 0 0 No No No  Number falls in past yr: 0 0 - - -  Injury with Fall? 0 0 - - -     Depression Screen PHQ 2/9 Scores 11/13/2018 08/06/2017 07/19/2016 07/19/2015  PHQ - 2 Score 0 0 0 0  PHQ- 9 Score 0 3 0 -    No flowsheet data found.    Assessment & Plan:     Annual Wellness Visit  Reviewed patient's Family Medical History Reviewed and updated list of patient's medical providers Assessment of cognitive impairment was done Assessed patient's functional ability Established a written schedule for health screening [place] Completed and Reviewed  Exercise Activities and Dietary recommendations Goals   None     Immunization History  Administered Date(s) Administered  . Fluad Quad(high Dose 65+) 10/28/2018  . Influenza, High Dose Seasonal PF 10/08/2015, 10/31/2016, 10/27/2017  . Pneumococcal Conjugate-13 07/14/2013  . Pneumococcal Polysaccharide-23 07/04/2011  . Tdap 06/04/2008  . Zoster 06/08/2009    Health Maintenance  Topic Date Due  . Samul Dada  06/05/2018  . INFLUENZA VACCINE  04/09/2019 (Originally 08/10/2018)  . COLONOSCOPY  08/08/2020  . Hepatitis C Screening  Completed  . PNA vac Low Risk Adult  Completed     Discussed health benefits of physical activity, and encouraged him to engage in regular exercise appropriate for his age and condition.     ------------------------------------------------------------------------------------------------------------    Lelon Huh, MD  [place]

## 2018-11-13 NOTE — Progress Notes (Signed)
Patient: [name], Male    DOB: [birth year], 73 y.o.   MRN: [id number] Visit Date: 11/13/2018  Today's Provider: Lelon Huh, MD   Chief Complaint  Patient presents with   Annual Exam   Subjective:    Annual physical exam [name] is a 73 y.o. male who presents today for health maintenance and complete physical. He feels well. He reports exercising none. He reports he is sleeping well.  He also complains of more trouble with swallowing the last several months. Feels like food does not go down throat easily, but has not gotten choked on food.  -----------------------------------------------------------------   Review of Systems  Constitutional: Negative.   HENT: Negative.   Eyes: Negative.   Respiratory: Negative.   Cardiovascular: Negative.   Gastrointestinal: Negative.   Endocrine: Negative.   Genitourinary: Negative.   Musculoskeletal: Negative.   Skin: Negative.   Allergic/Immunologic: Negative.   Neurological: Negative.   Hematological: Negative.   Psychiatric/Behavioral: Negative.     Social History He  reports that he has been smoking cigarettes. He has a 37.50 pack-year smoking history. He has never used smokeless tobacco. He reports that he does not drink alcohol or use drugs. Social History   Socioeconomic History   Marital status: Single    Spouse name: Not on file   Number of children: 0   Years of education: 11   Highest education level: Not on file  Occupational History   Occupation: Retired  Scientist, product/process development strain: Not on file   Food insecurity    Worry: Not on file    Inability: Not on Lexicographer needs    Medical: Not on file    Non-medical: Not on file  Tobacco Use   Smoking status: Current Every Day Smoker    Packs/day: 0.75    Years: 50.00    Pack years: 37.50    Types: Cigarettes    Last attempt to quit: 01/09/2018    Years since quitting: 0.8   Smokeless tobacco: Never  Used  Substance and Sexual Activity   Alcohol use: No   Drug use: No   Sexual activity: Not on file  Lifestyle   Physical activity    Days per week: Not on file    Minutes per session: Not on file   Stress: Not on file  Relationships   Social connections    Talks on phone: Not on file    Gets together: Not on file    Attends religious service: Not on file    Active member of club or organization: Not on file    Attends meetings of clubs or organizations: Not on file    Relationship status: Not on file  Other Topics Concern   Not on file  Social History Narrative   Patient lives at home with wife.    Patient is retired.    Patient has no children.    Patient has 11th grade education.     Patient Active Problem List   Diagnosis Date Noted   Aortic atherosclerosis ([place]) 06/24/2018   S/P lumbar fusion 02/18/2018   Bipolar disorder ([place]) 08/06/2017   S/P lumbar laminectomy 06/22/2017   Coronary atherosclerosis 01/29/2017   Lung nodule seen on imaging study 01/29/2017   Maculopathy 12/28/2016   Patent foramen ovale    Abscess of upper lobe of left lung without pneumonia ([place]) 09/14/2015   Smoking greater than 30 pack years 07/19/2015  Subclinical hypothyroidism 07/17/2014   Anxiety 07/08/2014   Dyspnea on exertion 07/08/2014   Memory deficit 07/31/2012   Cerebrovascular disease, unspecified 03/01/2012   Restless legs syndrome (RLS) 03/01/2012   Unspecified hereditary and idiopathic peripheral neuropathy 03/01/2012   COPD (chronic obstructive pulmonary disease) ([place]) 06/14/2010   BPH (benign prostatic hyperplasia) 06/08/2009   Late effects of cerebrovascular disease 07/19/2008   Bipolar I disorder, single manic episode, moderate ([place]) 07/14/2008   Fam hx-ischem heart disease 07/16/2006   Hyperlipidemia 01/10/2003   Herpesviral infection of perianal skin and rectum 01/10/2000    Past Surgical History:  Procedure Laterality Date   liver  excision     for non-cancerous mass   LUMBAR LAMINECTOMY/DECOMPRESSION MICRODISCECTOMY Left 06/22/2017   Procedure: Extraforaminal Microdiscectomy  - L4-L5 - left;  Surgeon: Eustace Moore, MD;  Location: [place];  Service: Neurosurgery;  Laterality: Left;   LUNG SURGERY  2005   Lipoma Removal   MAXIMUM ACCESS (MAS) TRANSFORAMINAL LUMBAR INTERBODY FUSION (TLIF) 1 LEVEL [name] 02/18/2018   Procedure: Transforaminal Lumbar Interbody Fusion - Lumbar Four - Lumbar Five;  Surgeon: Eustace Moore, MD;  Location: [facility] [place];  Service: Neurosurgery;  Laterality: [name];  Transforaminal Lumbar Interbody Fusion - Lumbar Four - Lumbar Five    Family History  Family Status  Relation Name Status   Mother  Deceased at age 80       MI   Father  Deceased at age 82       from cancer (unknown type)   Sister  Alive   Sister  Alive   Sister  Alive   Other Niece-2 Alive   PGF  (Not Specified)   His family history includes Cancer in his father; Diabetes in his sister; Heart disease in his father, mother, and sister; Seizures in his paternal grandfather.     Allergies  Allergen Reactions   Ropinirole Hcl Other (See Comments)    Confusion, agiitation, disorientation, Hallucinations    Previous Medications   ASPIRIN 81 MG TABLET    Take 1 tablet (81 mg total) by mouth daily.   BENZTROPINE (COGENTIN) 0.5 MG TABLET    benztropine 0.5 mg tablet  TAKE 1 TABLET BY MOUTH DAILY WITH DINNER   COENZYME Q10 (COQ10) 100 MG CAPS    Take 300 mg by mouth at bedtime.    DIVALPROEX (DEPAKOTE ER) 500 MG 24 HR TABLET    Take 1,000 mg by mouth at bedtime.    INFLUENZA VAC SPLIT QUADRIVALENT PF (FLUZONE HIGH-DOSE) 0.5 ML INJECTION    Fluzone High-Dose 2018-2019 (PF) 180 mcg/0.5 mL intramuscular syringe  TO BE ADMINISTERED BY PHARMACIST FOR IMMUNIZATION   OMEGA-3 FATTY ACIDS (FISH OIL) 1000 MG CAPS    Take 1,000 mg by mouth daily.   SIMVASTATIN (ZOCOR) 40 MG TABLET    TAKE 1 TABLET (40 MG TOTAL) BY MOUTH DAILY AT 6 PM.    ZIPRASIDONE (GEODON) 40 MG CAPSULE    Take 40 mg by mouth daily with supper.     Patient Care Team: Birdie Sons, MD as PCP - General (Family Medicine) Chauncey Mann, MD as Referring Physician (Psychiatry)      Objective:   Vitals: BP 114/74 (BP Location: Left Arm, Patient Position: Sitting, Cuff Size: Normal)    Pulse 80    Temp (!) 97.1 F (36.2 C) (Temporal)    Ht 5\' 10"  (1.778 m)    Wt 154 lb 6.4 oz (70 kg)    SpO2 98%  BMI 22.15 kg/m    Physical Exam   General Appearance:    Well developed, well nourished male. Alert, cooperative, in no acute distress, appears stated age  Head:    Normocephalic, without obvious abnormality, atraumatic  Eyes:    PERRL, conjunctiva/corneas clear, EOM's intact, fundi    benign, both eyes       Ears:    Normal TM's and external ear canals, both ears  Nose:   Nares normal, septum midline, mucosa normal, no drainage   or sinus tenderness  Throat:   Lips, mucosa, and tongue normal; teeth and gums normal  Neck:   Supple, symmetrical, trachea midline, no adenopathy;       thyroid:  No enlargement/tenderness/nodules; no carotid   bruit or JVD  Back:     Symmetric, no curvature, ROM normal, no CVA tenderness  Lungs:     Clear to auscultation bilaterally, respirations unlabored  Chest wall:    No tenderness or deformity  Heart:    Normal heart rate. Normal rhythm. No murmurs, rubs, or gallops.  S1 and S2 normal  Abdomen:     Soft, non-tender, bowel sounds active all four quadrants,    no masses, no organomegaly  Genitalia:    deferred  Rectal:    deferred  Extremities:   All extremities are intact. No cyanosis or edema  Pulses:   2+ and symmetric all extremities  Skin:   Skin color, texture, turgor normal, no rashes or lesions  Lymph nodes:   Cervical, supraclavicular, and axillary nodes normal  Neurologic:   CNII-XII intact. Normal strength, sensation and reflexes      throughout    Depression Screen PHQ 2/9 Scores 11/13/2018 08/06/2017  07/19/2016 07/19/2015  PHQ - 2 Score 0 0 0 0  PHQ- 9 Score 0 3 0 -      Assessment & Plan:     Routine Health Maintenance and Physical Exam  Exercise Activities and Dietary recommendations Goals   None     Immunization History  Administered Date(s) Administered   Influenza, High Dose Seasonal PF 10/08/2015, 10/31/2016, 10/27/2017   Pneumococcal Conjugate-13 07/14/2013   Pneumococcal Polysaccharide-23 07/04/2011   Tdap 06/04/2008   Zoster 06/08/2009    Health Maintenance  Topic Date Due   TETANUS/TDAP  06/05/2018   INFLUENZA VACCINE  08/10/2018   COLONOSCOPY  08/08/2020   Hepatitis C Screening  Completed   PNA vac Low Risk Adult  Completed     Discussed health benefits of physical activity, and encouraged him to engage in regular exercise appropriate for his age and condition.    --------------------------------------------------------------------   2. Bipolar disorder, in full remission, most recent episode mixed ([place]) Well controlled on current medications, continue routine follow up Dr. Nicolasa Ducking  3. Smoking greater than 30 pack years Encouraged smoking cessation, continue annual LDCT screening  4. Aortic atherosclerosis ([facility]) Asymptomatic. Compliant with medication.  Continue aggressive risk factor modification.    5. Chronic obstructive pulmonary disease, unspecified COPD type ([place]) Per LDCT. Minimally symptomatic. Encourage smoking cessation.   6. Pharyngeal dysphagia  - DG SWALLOW FUNC OP MEDICARE SPEECH PATH; Future  7. Subclinical hypothyroidism  - TSH  8. Hyperlipidemia, unspecified hyperlipidemia type He is tolerating simvastatin well with no adverse effects.   - Comprehensive metabolic panel - Lipid panel - CBC  9. Prostate cancer screening  - PSA   He states he already had flu vaccine at [facility]

## 2018-11-14 LAB — CBC
Hematocrit: 41 % (ref 37.5–51.0)
Hemoglobin: 14.2 g/dL (ref 13.0–17.7)
MCH: 34.1 pg — ABNORMAL HIGH (ref 26.6–33.0)
MCHC: 34.6 g/dL (ref 31.5–35.7)
MCV: 99 fL — ABNORMAL HIGH (ref 79–97)
Platelets: 139 10*3/uL — ABNORMAL LOW (ref 150–450)
RBC: 4.16 x10E6/uL (ref 4.14–5.80)
RDW: 13.1 % (ref 11.6–15.4)
WBC: 6.3 10*3/uL (ref 3.4–10.8)

## 2018-11-14 LAB — COMPREHENSIVE METABOLIC PANEL
ALT: 14 IU/L (ref 0–44)
AST: 23 IU/L (ref 0–40)
Albumin/Globulin Ratio: 2 (ref 1.2–2.2)
Albumin: 4.3 g/dL (ref 3.7–4.7)
Alkaline Phosphatase: 81 IU/L (ref 39–117)
BUN/Creatinine Ratio: 15 (ref 10–24)
BUN: 16 mg/dL (ref 8–27)
Bilirubin Total: 0.3 mg/dL (ref 0.0–1.2)
CO2: 24 mmol/L (ref 20–29)
Calcium: 9.5 mg/dL (ref 8.6–10.2)
Chloride: 106 mmol/L (ref 96–106)
Creatinine, Ser: 1.06 mg/dL (ref 0.76–1.27)
GFR calc Af Amer: 80 mL/min/{1.73_m2} (ref >59)
GFR calc non Af Amer: 69 mL/min/{1.73_m2} (ref >59)
Globulin, Total: 2.2 g/dL (ref 1.5–4.5)
Glucose: 83 mg/dL (ref 65–99)
Potassium: 4.7 mmol/L (ref 3.5–5.2)
Sodium: 145 mmol/L — ABNORMAL HIGH (ref 134–144)
Total Protein: 6.5 g/dL (ref 6.0–8.5)

## 2018-11-14 LAB — PSA: Prostate Specific Ag, Serum: 1.9 ng/mL (ref 0.0–4.0)

## 2018-11-14 LAB — LIPID PANEL
Chol/HDL Ratio: 2.3 ratio (ref 0.0–5.0)
Cholesterol, Total: 160 mg/dL (ref 100–199)
HDL: 69 mg/dL (ref >39)
LDL Chol Calc (NIH): 74 mg/dL (ref 0–99)
Triglycerides: 94 mg/dL (ref 0–149)
VLDL Cholesterol Cal: 17 mg/dL (ref 5–40)

## 2018-11-14 LAB — TSH: TSH: 2.42 u[IU]/mL (ref 0.450–4.500)

## 2018-11-20 DIAGNOSIS — E538 Deficiency of other specified B group vitamins: Secondary | ICD-10-CM | POA: Diagnosis not present

## 2018-11-20 DIAGNOSIS — R5381 Other malaise: Secondary | ICD-10-CM | POA: Diagnosis not present

## 2018-11-20 DIAGNOSIS — E559 Vitamin D deficiency, unspecified: Secondary | ICD-10-CM | POA: Diagnosis not present

## 2018-11-20 DIAGNOSIS — R251 Tremor, unspecified: Secondary | ICD-10-CM | POA: Diagnosis not present

## 2018-11-20 DIAGNOSIS — F3012 Manic episode without psychotic symptoms, moderate: Secondary | ICD-10-CM | POA: Diagnosis not present

## 2018-11-21 DIAGNOSIS — H353121 Nonexudative age-related macular degeneration, left eye, early dry stage: Secondary | ICD-10-CM | POA: Diagnosis not present

## 2018-11-21 DIAGNOSIS — H353112 Nonexudative age-related macular degeneration, right eye, intermediate dry stage: Secondary | ICD-10-CM | POA: Diagnosis not present

## 2018-11-21 DIAGNOSIS — H3554 Dystrophies primarily involving the retinal pigment epithelium: Secondary | ICD-10-CM | POA: Diagnosis not present

## 2018-11-21 DIAGNOSIS — H2513 Age-related nuclear cataract, bilateral: Secondary | ICD-10-CM | POA: Diagnosis not present

## 2018-11-21 DIAGNOSIS — H35363 Drusen (degenerative) of macula, bilateral: Secondary | ICD-10-CM | POA: Diagnosis not present

## 2018-11-21 DIAGNOSIS — H35723 Serous detachment of retinal pigment epithelium, bilateral: Secondary | ICD-10-CM | POA: Diagnosis not present

## 2018-11-21 DIAGNOSIS — H35451 Secondary pigmentary degeneration, right eye: Secondary | ICD-10-CM | POA: Diagnosis not present

## 2018-11-27 ENCOUNTER — Other Ambulatory Visit: Payer: Self-pay | Admitting: Neurology

## 2018-11-27 DIAGNOSIS — R251 Tremor, unspecified: Secondary | ICD-10-CM

## 2018-12-10 ENCOUNTER — Other Ambulatory Visit: Payer: Self-pay

## 2018-12-10 ENCOUNTER — Ambulatory Visit
Admission: RE | Admit: 2018-12-10 | Discharge: 2018-12-10 | Disposition: A | Discharge status: Home / Self Care | Payer: PPO | Source: Ambulatory Visit | Attending: Neurology | Admitting: Neurology

## 2018-12-10 DIAGNOSIS — R251 Tremor, unspecified: Secondary | ICD-10-CM | POA: Diagnosis not present

## 2018-12-11 ENCOUNTER — Ambulatory Visit (INDEPENDENT_AMBULATORY_CARE_PROVIDER_SITE_OTHER): Payer: PPO | Admitting: Internal Medicine

## 2018-12-11 ENCOUNTER — Encounter: Payer: Self-pay | Admitting: Internal Medicine

## 2018-12-11 VITALS — BP 110/70 | HR 57 | Temp 97.7°F | Ht 70.0 in | Wt 159.0 lb

## 2018-12-11 DIAGNOSIS — J441 Chronic obstructive pulmonary disease with (acute) exacerbation: Secondary | ICD-10-CM | POA: Diagnosis not present

## 2018-12-11 DIAGNOSIS — R918 Other nonspecific abnormal finding of lung field: Secondary | ICD-10-CM | POA: Diagnosis not present

## 2018-12-11 MED ORDER — AMOXICILLIN-POT CLAVULANATE 875-125 MG PO TABS: 1.0000 | ORAL_TABLET | Freq: Two times a day (BID) | ORAL | 0 refills | Status: AC

## 2018-12-11 NOTE — Patient Instructions (Signed)
Start AUGMENTIN 875 mg twice daily for 14 days Obtain CT chest in 4 weeks

## 2018-12-11 NOTE — Progress Notes (Signed)
* [facility]        Date: 12/11/2018  MRN# [id number] [name] 17-Oct-1945   [name] is a 73 y.o. old male seen in follow up for chief complaint of  Chief Complaint  Patient presents with  . Follow-up     Synopsis: Patient had a left upper lobe spiculated lesion, status post CT biopsy on 09/03/15, consistent with abscess, no micro available.  Treated with Augmentin for 2 weeks.  Subsequent PET scan 01/13/16 minimally changed left upper lobe lesion with calcification, minimally changed from 12/15/15, 08/18/15.[name]  No further CT follow-up was deemed necessary.  COPD with continued smoking.  chest x-ray 07/19/16; hyperinflation consistent with emphysema, otherwise lungs are unremarkable.  Review of his pulmonary function test done at [facility] showed that he has a mild obstruction, FEV1/FVC 68%, FEV1 81%, DLCO 90%. His 6 minute walk test showed a total distance of 1014 feet, no desaturations, highest heart rate 74.  Chief complaint Follow-up COPD Follow-up abnormal CT chest  HPI COPD seems to be stable at this time uses albuterol as needed No  COPD exacerbation at this time No evidence of heart failure at this time No evidence or signs of infection at this time No respiratory distress No fevers, chills, nausea, vomiting, diarrhea No evidence of lower extremity edema No evidence hemoptysis  AB-normal lung findings on CT Recommend antibiotics at this time as patient's family is requesting Patient does not need prednisone at this time    Medication:   Outpatient Encounter Medications as of 12/11/2018  Medication Sig  . aspirin 81 MG tablet Take 1 tablet (81 mg total) by mouth daily.  . benztropine (COGENTIN) 0.5 MG tablet benztropine 0.5 mg tablet  TAKE 1 TABLET BY MOUTH DAILY WITH DINNER  . Coenzyme Q10 (COQ10) 100 MG CAPS Take 300 mg by mouth at bedtime.   . divalproex (DEPAKOTE ER) 500 MG 24 hr tablet Take 1,000 mg by mouth at bedtime.   .  Influenza vac split quadrivalent PF (FLUZONE HIGH-DOSE) 0.5 ML injection Fluzone High-Dose 2018-2019 (PF) 180 mcg/0.5 mL intramuscular syringe  TO BE ADMINISTERED BY PHARMACIST FOR IMMUNIZATION  . Omega-3 Fatty Acids (FISH OIL) 1000 MG CAPS Take 1,000 mg by mouth daily.  . simvastatin (ZOCOR) 40 MG tablet TAKE 1 TABLET (40 MG TOTAL) BY MOUTH DAILY AT 6 PM.  . ziprasidone (GEODON) 40 MG capsule Take 40 mg by mouth daily with supper.    No facility-administered encounter medications on file as of 12/11/2018.      Allergies:  Ropinirole hcl  Review of Systems:  Gen:  Denies  fever, sweats, chills weight loss  HEENT: Denies blurred vision, double vision, ear pain, eye pain, hearing loss, nose bleeds, sore throat Cardiac:  No dizziness, chest pain or heaviness, chest tightness,edema, No JVD Resp:   No cough, -sputum production, -shortness of breath,-wheezing, -hemoptysis,  Gi: Denies swallowing difficulty, stomach pain, nausea or vomiting, diarrhea, constipation, bowel incontinence Gu:  Denies bladder incontinence, burning urine Ext:   Denies Joint pain, stiffness or swelling Skin: Denies  skin rash, easy bruising or bleeding or hives Endoc:  Denies polyuria, polydipsia , polyphagia or weight change Psych:   Denies depression, insomnia or hallucinations  Other:  All other systems negative   BP 110/70 (BP Location: Left Arm, Cuff Size: Normal)   Pulse (!) 57   Temp 97.7 F (36.5 C) (Temporal)   Ht 5\' 10"  (1.778 m)   Wt 159 lb (72.1 kg)  SpO2 100%   BMI 22.81 kg/m   Physical Examination:   GENERAL:NAD, no fevers, chills, no weakness no fatigue HEAD: Normocephalic, atraumatic.  EYES: PERLA, EOMI No scleral icterus.  MOUTH: Moist mucosal membrane.  EAR, NOSE, THROAT: Clear without exudates. No external lesions.  NECK: Supple. No thyromegaly.  No JVD.  PULMONARY: CTA B/L no wheezing, rhonchi, crackles CARDIOVASCULAR: S1 and S2. Regular rate and rhythm. No murmurs  GASTROINTESTINAL: Soft, nontender, nondistended. Positive bowel sounds.  MUSCULOSKELETAL: No swelling, clubbing, or edema.  NEUROLOGIC: No gross focal neurological deficits. 5/5 strength all extremities SKIN: No ulceration, lesions, rashes, or cyanosis.  PSYCHIATRIC: Insight, judgment intact. -depression -anxiety ALL OTHER ROS ARE NEGATIVE      Assessment and Plan:   COPD Minimal symptoms at this time his main treatment is albuterol as needed Patient quit smoking 3 weeks ago  PCV 13 07/14/13; PPSV 07/04/11.    Nicotine abuse. Smoking cessation successful 3 weeks ago  Hiatal hernia intrathoracic stomach Previous history of lung abscess treated with antibiotics  Lung abscess/lung nodule. I have independently reviewed the x-rays again with the patient and his wife.  There are 3 lesions in the right lung that are about 7 to 8 mm in size.  These have increased slightly over the last several months.  The lower lobe is actually 2 kissing lesions each about 5 to 6 mm.  Within the upper lobe just at the level of the fissure there is a another 6 to 7 mm nodule.  The PET scan showed mild activity in the right lung but nothing in the left lung. At this point I recommend patient being treated with antibiotics and repeat CT chest in about 4 weeks for interval assessment  Improved since being seen by Dr. Genevive Bi for surgical resection at this time he does meet criteria for resection however the family is hesitant to undergo such a invasive procedure at this time Patient would need a thoracotomy with wedge resection  At this time recommend antibiotic therapy and repeat CT scans in 1 month    COVID-19 EDUCATION: The signs and symptoms of COVID-19 were discussed with the patient and how to seek care for testing.  The importance of social distancing was discussed today. Hand Washing Techniques and avoid touching face was advised.     MEDICATION ADJUSTMENTS/LABS AND TESTS ORDERED: Augmentin 875  p.o. twice daily for 14 days CT  chest in 1 to 2 months COVID-19 CDC guideline data points:   CURRENT MEDICATIONS REVIEWED AT LENGTH WITH PATIENT TODAY   Patient satisfied with Plan of action and management. All questions answered  Follow up in 1-2 months   Jontavia Leatherbury Patricia Pesa, M.D.  [facility]  Medical Director [place] Director [facility] Cardio-Pulmonary Department

## 2018-12-19 DIAGNOSIS — F3172 Bipolar disorder, in full remission, most recent episode hypomanic: Secondary | ICD-10-CM | POA: Diagnosis not present

## 2018-12-20 ENCOUNTER — Ambulatory Visit
Admission: RE | Admit: 2018-12-20 | Discharge: 2018-12-20 | Disposition: A | Discharge status: Home / Self Care | Payer: PPO | Source: Ambulatory Visit | Attending: Family Medicine | Admitting: Family Medicine

## 2018-12-20 DIAGNOSIS — R1312 Dysphagia, oropharyngeal phase: Secondary | ICD-10-CM | POA: Insufficient documentation

## 2018-12-20 DIAGNOSIS — R1310 Dysphagia, unspecified: Secondary | ICD-10-CM | POA: Diagnosis not present

## 2018-12-20 NOTE — Therapy (Signed)
[place], [place], [address] Phone: [phone]   Fax:     Modified Barium Swallow  Patient Details  Name: [name] MRN: [id number] Date of Birth: 11/17/1945 No data recorded  Encounter Date: 12/20/2018  End of Session - 12/20/18 1611    Visit Number  1    Number of Visits  1    Date for SLP Re-Evaluation  12/20/18    SLP Start Time  1245    SLP Stop Time   1330    SLP Time Calculation (min)  45 min    Activity Tolerance  Patient tolerated treatment well       Past Medical History:  Diagnosis Date  . Arthritis    in his fingers  . Emphysema   . Episodic confusion    Possible bipolar disorder  . GERD (gastroesophageal reflux disease)   . History of chicken pox   . HNP (herniated nucleus pulposus), lumbar   . Memory deficit 07/31/2012  . OCD (obsessive compulsive disorder)   . Peripheral neuropathy   . Restless leg syndrome 01/10/2003  . Unspecified psychosis 03/01/2012    Past Surgical History:  Procedure Laterality Date  . liver excision     for non-cancerous mass  . LUMBAR LAMINECTOMY/DECOMPRESSION MICRODISCECTOMY Left 06/22/2017   Procedure: Extraforaminal Microdiscectomy  - L4-L5 - left;  Surgeon: Eustace Moore, MD;  Location: [place];  Service: Neurosurgery;  Laterality: Left;  . LUNG SURGERY  2005   Lipoma Removal  . MAXIMUM ACCESS (MAS) TRANSFORAMINAL LUMBAR INTERBODY FUSION (TLIF) 1 LEVEL [name] 02/18/2018   Procedure: Transforaminal Lumbar Interbody Fusion - Lumbar Four - Lumbar Five;  Surgeon: Eustace Moore, MD;  Location: [facility];  Service: Neurosurgery;  Laterality: [name];  Transforaminal Lumbar Interbody Fusion - Lumbar Four - Lumbar Five    There were no vitals filed for this visit.     Subjective: Patient behavior: (alertness, ability to follow instructions, etc.): The patient is alert, verbalizes his complaints, and follows simple directions.  Chief complaint:  Patient describes difficulty initiating a swallow   Objective:  Radiological Procedure: A videoflouroscopic evaluation of oral-preparatory, reflex initiation, and pharyngeal phases of the swallow was performed; as well as a screening of the upper esophageal phase.  I. POSTURE: Upright in MBS chair  II. VIEW: Lateral  III. COMPENSATORY STRATEGIES: [name]  IV. BOLUSES ADMINISTERED:  Thin Liquid:  4 rapid consecutive   Nectar-thick Liquid: 2 moderate    Honey-thick Liquid: DNT   Puree: 2 teaspoon presentations   Mechanical Soft: 1/4 graham cracker in applesauce  V. RESULTS OF EVALUATION: A. ORAL PREPARATORY PHASE: (The lips, tongue, and velum are observed for strength and coordination)       **Overall Severity Rating: Mild; disorganized, slowed posterior transfer  B. SWALLOW INITIATION/REFLEX: (The reflex is normal if "triggered" by the time the bolus reached the base of the tongue)  **Overall Severity Rating: Mild; triggers at the valleculae  C. PHARYNGEAL PHASE: (Pharyngeal function is normal if the bolus shows rapid, smooth, and continuous transit through the pharynx and there is no pharyngeal residue after the swallow)  **Overall Severity Rating: Mild; reduced tongue base retraction with trace-to-mild vallecular residue  D. LARYNGEAL PENETRATION: (Material entering into the laryngeal inlet/vestibule but not aspirated) None  E. ASPIRATION: None  F. ESOPHAGEAL PHASE: (Screening of the upper esophagus) Screening scans of the esophagus reveals distal stasis with retrograde movement and corkscrew appearance of the distal esophagus.  ASSESSMENT: This 73 year old man; with self-reported difficulty swallowing "It's just hard to get the swallow going"; is presenting with mild oropharyngeal dysphagia characterized by disorganized oral management, delayed pharyngeal swallow initiation, reduced tongue base retraction, and trace-to-mild vallecular residue post swallow.  There is no observed  laryngeal penetration or tracheal aspiration.  The patient is not at risk for prandial aspiration.  Screening scans of the esophagus reveals distal stasis with retrograde movement and corkscrew appearance of the distal esophagus.  Recommend referral to GI.  PLAN/RECOMMENDATIONS:   A. Diet: Regular   B. Swallowing Precautions: Small bites/sips, chew well   C. Recommended consultation to: GI   D. Therapy recommendations: speech therapy is not indicated   E. Results and recommendations were discussed with patient and caregiver immediately following the study and the final report routed to the referring MD.    Oropharyngeal dysphagia - Plan: DG SWALLOW FUNC OP MEDICARE SPEECH PATH, DG SWALLOW FUNC OP MEDICARE SPEECH PATH        Problem List Patient Active Problem List   Diagnosis Date Noted  . Aortic atherosclerosis ([place]) 06/24/2018  . S/P lumbar fusion 02/18/2018  . Bipolar disorder ([place]) 08/06/2017  . S/P lumbar laminectomy 06/22/2017  . Coronary atherosclerosis 01/29/2017  . Lung nodule seen on imaging study 01/29/2017  . Maculopathy 12/28/2016  . Patent foramen ovale   . Abscess of upper lobe of left lung without pneumonia ([place]) 09/14/2015  . Smoking greater than 30 pack years 07/19/2015  . Subclinical hypothyroidism 07/17/2014  . Anxiety 07/08/2014  . Dyspnea on exertion 07/08/2014  . Memory deficit 07/31/2012  . Cerebrovascular disease, unspecified 03/01/2012  . Restless legs syndrome (RLS) 03/01/2012  . Unspecified hereditary and idiopathic peripheral neuropathy 03/01/2012  . COPD (chronic obstructive pulmonary disease) ([place]) 06/14/2010  . BPH (benign prostatic hyperplasia) 06/08/2009  . Late effects of cerebrovascular disease 07/19/2008  . Bipolar I disorder, single manic episode, moderate ([place]) 07/14/2008  . Fam hx-ischem heart disease 07/16/2006  . Hyperlipidemia 01/10/2003  . Herpesviral infection of perianal skin and rectum 01/10/2000   Leroy Sea,  MS/CCC- SLP  Lou Miner 12/20/2018, 4:12 PM  [place], [place], [address] Phone: [phone]   Fax:     Name: RIGLEY NIESS MRN: [id number] Date of Birth: Nov 05, 1945

## 2018-12-23 ENCOUNTER — Other Ambulatory Visit: Payer: Self-pay | Admitting: Family Medicine

## 2018-12-23 DIAGNOSIS — R1319 Other dysphagia: Secondary | ICD-10-CM

## 2018-12-23 DIAGNOSIS — R1310 Dysphagia, unspecified: Secondary | ICD-10-CM

## 2018-12-25 ENCOUNTER — Other Ambulatory Visit: Payer: Self-pay

## 2018-12-25 ENCOUNTER — Ambulatory Visit
Admission: RE | Admit: 2018-12-25 | Discharge: 2018-12-25 | Disposition: A | Discharge status: Home / Self Care | Payer: PPO | Source: Ambulatory Visit | Attending: Cardiothoracic Surgery | Admitting: Cardiothoracic Surgery

## 2018-12-25 DIAGNOSIS — R918 Other nonspecific abnormal finding of lung field: Secondary | ICD-10-CM | POA: Diagnosis not present

## 2018-12-27 ENCOUNTER — Encounter: Payer: Self-pay | Admitting: Cardiothoracic Surgery

## 2018-12-27 ENCOUNTER — Ambulatory Visit: Payer: PPO | Admitting: Cardiothoracic Surgery

## 2018-12-27 ENCOUNTER — Other Ambulatory Visit: Payer: Self-pay

## 2018-12-27 ENCOUNTER — Other Ambulatory Visit: Payer: Self-pay | Admitting: Family Medicine

## 2018-12-27 VITALS — BP 126/77 | HR 76 | Temp 97.7°F | Resp 14 | Ht 70.0 in | Wt 158.2 lb

## 2018-12-27 DIAGNOSIS — R911 Solitary pulmonary nodule: Secondary | ICD-10-CM | POA: Diagnosis not present

## 2018-12-27 NOTE — H&P (View-Only) (Signed)
  Patient ID: [name], male   DOB: 06-25-1945, 73 y.o.   MRN: [id number]  HISTORY: He returns today with his sister in further follow-up.  He is a 73 year old gentleman with a long smoking history who quit about a month ago.  He saw Dr. Woody Seller for both upper and lower lobe nodules.  He was placed on antibiotics and a repeat CT scan was performed.  The CT scan which she had made just a few days ago showed continued enlargement of the peripheral right upper and peripheral right lower lobe nodules that were felt to be consistent with malignancy.  The patient does have a hiatal hernia and has had difficulty swallowing.  He recently underwent a barium swallow but those results are not available to me.  The patient did quit smoking about 1 month ago.  He was congratulated on that.   Vitals:   12/27/18 1038  BP: 126/77  Pulse: 76  Resp: 14  Temp: 97.7 F (36.5 C)  SpO2: 99%     EXAM:    Resp: Lungs are clear bilaterally.  No respiratory distress, normal effort. Heart:  Regular without murmurs Abd:  Abdomen is soft, non distended and non tender. No masses are palpable.  There is no rebound and no guarding.  Neurological: Alert and oriented to person, place, and time. Coordination normal.  Skin: Skin is warm and dry. No rash noted. No diaphoretic. No erythema. No pallor.  Psychiatric: Normal mood and affect. Normal behavior. Judgment and thought content normal.    ASSESSMENT: I have independently reviewed the patient's CT scan.  There is a peripheral right upper lobe and a peripheral right lower lobe nodule.  His pulmonary function studies revealed an FEV1 of about 80% and a DLCO of approximately 70%.   PLAN:   I reviewed with him and his sister the results of the CT scan.  We discussed the options including percutaneous biopsy.  He also has a history of a left upper lobe abscess and its entirely possible that these may represent areas of infection.  We discussed the role of  biopsy.  We also discussed the role of surgery via a right thoracotomy.  We discussed the role of wedge resection of both of these lesions.  They understand that the location of these may be amenable to wedge resection.  Given his underlying conditions I am not certain that a formal lobar resection would be indicated.  Therefore I reviewed with them the indications and risks of right thoracotomy with wedge resection of right upper and right lower lobe nodules with subsequent radiation therapy or chemotherapy should the need arise for any positive margin.  I reviewed with them the risks of surgery including risks of bleeding, infection, air leak and death.  I also reviewed with them the other treatment options including radiation therapy.  After an extensive discussion they would like to proceed on with surgery.  We will go ahead and set him up for that.    [name], MD

## 2018-12-27 NOTE — Telephone Encounter (Signed)
Requested medication (s) are due for refill today: no  Requested medication (s) are on the active medication list: no  Last refill: 09/11/2018  Future visit scheduled: no  Notes to clinic: overdue for office visit  Medication was d/c   Requested Prescriptions  Pending Prescriptions Disp Refills   omeprazole (PRILOSEC) 40 MG capsule [Pharmacy Med Name: OMEPRAZOLE DR 40 MG CAPSULE] 90 capsule 4    Sig: TAKE 1 [place]      Gastroenterology: Proton Pump Inhibitors Failed - 12/27/2018  3:43 AM      Failed - Valid encounter within last 12 months    Recent Outpatient Visits           1 month ago Annual physical exam   [facility] Birdie Sons, MD   1 year ago Annual physical exam   [facility] Birdie Sons, MD   2 years ago Annual physical exam   [facility] Birdie Sons, MD   3 years ago Chronic obstructive pulmonary disease, unspecified COPD type [facility])   [facility] Birdie Sons, MD   4 years ago Annual physical exam   [facility] Birdie Sons, MD

## 2018-12-27 NOTE — Patient Instructions (Addendum)
Our surgery scheduler will call you within 24/48 hours to schedule the surgery. Please  have the Blue sheet available when speaking with her.

## 2018-12-27 NOTE — Progress Notes (Signed)
  Patient ID: [name], male   DOB: 01-09-1946, 73 y.o.   MRN: [id number]  HISTORY: He returns today with his sister in further follow-up.  He is a 73 year old gentleman with a long smoking history who quit about a month ago.  He saw Dr. Woody Seller for both upper and lower lobe nodules.  He was placed on antibiotics and a repeat CT scan was performed.  The CT scan which she had made just a few days ago showed continued enlargement of the peripheral right upper and peripheral right lower lobe nodules that were felt to be consistent with malignancy.  The patient does have a hiatal hernia and has had difficulty swallowing.  He recently underwent a barium swallow but those results are not available to me.  The patient did quit smoking about 1 month ago.  He was congratulated on that.   Vitals:   12/27/18 1038  BP: 126/77  Pulse: 76  Resp: 14  Temp: 97.7 F (36.5 C)  SpO2: 99%     EXAM:    Resp: Lungs are clear bilaterally.  No respiratory distress, normal effort. Heart:  Regular without murmurs Abd:  Abdomen is soft, non distended and non tender. No masses are palpable.  There is no rebound and no guarding.  Neurological: Alert and oriented to person, place, and time. Coordination normal.  Skin: Skin is warm and dry. No rash noted. No diaphoretic. No erythema. No pallor.  Psychiatric: Normal mood and affect. Normal behavior. Judgment and thought content normal.    ASSESSMENT: I have independently reviewed the patient's CT scan.  There is a peripheral right upper lobe and a peripheral right lower lobe nodule.  His pulmonary function studies revealed an FEV1 of about 80% and a DLCO of approximately 70%.   PLAN:   I reviewed with him and his sister the results of the CT scan.  We discussed the options including percutaneous biopsy.  He also has a history of a left upper lobe abscess and its entirely possible that these may represent areas of infection.  We discussed the role of  biopsy.  We also discussed the role of surgery via a right thoracotomy.  We discussed the role of wedge resection of both of these lesions.  They understand that the location of these may be amenable to wedge resection.  Given his underlying conditions I am not certain that a formal lobar resection would be indicated.  Therefore I reviewed with them the indications and risks of right thoracotomy with wedge resection of right upper and right lower lobe nodules with subsequent radiation therapy or chemotherapy should the need arise for any positive margin.  I reviewed with them the risks of surgery including risks of bleeding, infection, air leak and death.  I also reviewed with them the other treatment options including radiation therapy.  After an extensive discussion they would like to proceed on with surgery.  We will go ahead and set him up for that.    [name], MD

## 2018-12-31 ENCOUNTER — Telehealth: Payer: Self-pay | Admitting: Cardiothoracic Surgery

## 2018-12-31 NOTE — Telephone Encounter (Signed)
Pt has been advised of pre admission date/time, Covid Testing date and Surgery date.  Surgery Date: 01/06/19 with Dr Jonne Ply thoracotomy and lung resection (RUL and RLL) Preadmission Testing Date: 01/01/19 between 1-5:00pm-phone interview.  Covid Testing Date: 01/02/19 between 8-12pm - patient advised to go to the [place] [facility]  [employer] Video sent via [employer] [facility] and [employer].  Patient has been made aware to call [phone], between 1-3:00pm on 01/01/19, to find out what time to arrive.

## 2019-01-01 ENCOUNTER — Other Ambulatory Visit: Payer: Self-pay | Admitting: Cardiothoracic Surgery

## 2019-01-01 ENCOUNTER — Encounter
Admission: RE | Admit: 2019-01-01 | Discharge: 2019-01-01 | Disposition: A | Discharge status: Home / Self Care | Payer: PPO | Source: Ambulatory Visit | Attending: Cardiothoracic Surgery | Admitting: Cardiothoracic Surgery

## 2019-01-01 ENCOUNTER — Ambulatory Visit
Admission: RE | Admit: 2019-01-01 | Discharge: 2019-01-01 | Disposition: A | Discharge status: Home / Self Care | Payer: PPO | Source: Ambulatory Visit | Attending: Cardiothoracic Surgery | Admitting: Cardiothoracic Surgery

## 2019-01-01 ENCOUNTER — Inpatient Hospital Stay: Admission: RE | Admit: 2019-01-01 | Payer: PPO | Source: Ambulatory Visit

## 2019-01-01 ENCOUNTER — Other Ambulatory Visit: Payer: Self-pay

## 2019-01-01 DIAGNOSIS — J449 Chronic obstructive pulmonary disease, unspecified: Secondary | ICD-10-CM | POA: Diagnosis not present

## 2019-01-01 DIAGNOSIS — Z20828 Contact with and (suspected) exposure to other viral communicable diseases: Secondary | ICD-10-CM | POA: Insufficient documentation

## 2019-01-01 DIAGNOSIS — J439 Emphysema, unspecified: Secondary | ICD-10-CM | POA: Insufficient documentation

## 2019-01-01 DIAGNOSIS — R9431 Abnormal electrocardiogram [ECG] [EKG]: Secondary | ICD-10-CM | POA: Insufficient documentation

## 2019-01-01 DIAGNOSIS — R918 Other nonspecific abnormal finding of lung field: Secondary | ICD-10-CM

## 2019-01-01 DIAGNOSIS — Z01818 Encounter for other preprocedural examination: Secondary | ICD-10-CM | POA: Insufficient documentation

## 2019-01-01 DIAGNOSIS — Z01810 Encounter for preprocedural cardiovascular examination: Secondary | ICD-10-CM | POA: Diagnosis not present

## 2019-01-01 DIAGNOSIS — K449 Diaphragmatic hernia without obstruction or gangrene: Secondary | ICD-10-CM | POA: Insufficient documentation

## 2019-01-01 HISTORY — DX: Depression, unspecified: F32.A

## 2019-01-01 HISTORY — DX: Hyperlipidemia, unspecified: E78.5

## 2019-01-01 HISTORY — DX: Personal history of other diseases of the digestive system: Z87.19

## 2019-01-01 HISTORY — DX: Bipolar disorder, unspecified: F31.9

## 2019-01-01 LAB — CBC WITH DIFFERENTIAL/PLATELET
Abs Immature Granulocytes: 0.03 10*3/uL (ref 0.00–0.07)
Basophils Absolute: 0.1 10*3/uL (ref 0.0–0.1)
Basophils Relative: 1 %
Eosinophils Absolute: 0.2 10*3/uL (ref 0.0–0.5)
Eosinophils Relative: 2 %
HCT: 39.5 % (ref 39.0–52.0)
Hemoglobin: 13.7 g/dL (ref 13.0–17.0)
Immature Granulocytes: 0 %
Lymphocytes Relative: 32 %
Lymphs Abs: 2.2 10*3/uL (ref 0.7–4.0)
MCH: 33.4 pg (ref 26.0–34.0)
MCHC: 34.7 g/dL (ref 30.0–36.0)
MCV: 96.3 fL (ref 80.0–100.0)
Monocytes Absolute: 0.6 10*3/uL (ref 0.1–1.0)
Monocytes Relative: 9 %
Neutro Abs: 3.9 10*3/uL (ref 1.7–7.7)
Neutrophils Relative %: 56 %
Platelets: 191 10*3/uL (ref 150–400)
RBC: 4.1 MIL/uL — ABNORMAL LOW (ref 4.22–5.81)
RDW: 13 % (ref 11.5–15.5)
WBC: 7 10*3/uL (ref 4.0–10.5)
nRBC: 0 % (ref 0.0–0.2)

## 2019-01-01 LAB — COMPREHENSIVE METABOLIC PANEL
ALT: 21 U/L (ref 0–44)
AST: 26 U/L (ref 15–41)
Albumin: 3.9 g/dL (ref 3.5–5.0)
Alkaline Phosphatase: 66 U/L (ref 38–126)
Anion gap: 10 (ref 5–15)
BUN: 20 mg/dL (ref 8–23)
CO2: 26 mmol/L (ref 22–32)
Calcium: 8.9 mg/dL (ref 8.9–10.3)
Chloride: 106 mmol/L (ref 98–111)
Creatinine, Ser: 1.02 mg/dL (ref 0.61–1.24)
GFR calc Af Amer: >60 mL/min (ref >60)
GFR calc non Af Amer: >60 mL/min (ref >60)
Glucose, Bld: 133 mg/dL — ABNORMAL HIGH (ref 70–99)
Potassium: 4.3 mmol/L (ref 3.5–5.1)
Sodium: 142 mmol/L (ref 135–145)
Total Bilirubin: 0.5 mg/dL (ref 0.3–1.2)
Total Protein: 6.8 g/dL (ref 6.5–8.1)

## 2019-01-01 LAB — PROTIME-INR
INR: 0.9 (ref 0.8–1.2)
Prothrombin Time: 12.3 seconds (ref 11.4–15.2)

## 2019-01-01 LAB — SARS CORONAVIRUS 2 (TAT 6-24 HRS): SARS Coronavirus 2: NEGATIVE

## 2019-01-01 LAB — APTT: aPTT: 27 seconds (ref 24–36)

## 2019-01-01 NOTE — Patient Instructions (Signed)
Your procedure is scheduled on: Monday, January 06, 2019 Report to Day Surgery on the 2nd floor of the [facility]. To find out your arrival time, please call [phone] between 1PM - 3PM on: Thursday, January 02, 2019  REMEMBER: Instructions that are not followed completely may result in serious medical risk, up to and including death; or upon the discretion of your surgeon and anesthesiologist your surgery may need to be rescheduled.  Do not eat food after midnight the night before surgery.  No gum chewing, lozengers or hard candies.  You may however, drink CLEAR liquids up to 2 hours before you are scheduled to arrive for your surgery. Do not drink anything within 2 hours of the start of your surgery.  Clear liquids include: - water  - apple juice without pulp - gatorade - black coffee or tea (Do NOT add milk or creamers to the coffee or tea) Do NOT drink anything that is not on this list.  No Alcohol for 24 hours before or after surgery.  No Smoking including e-cigarettes for 24 hours prior to surgery.  No chewable tobacco products for at least 6 hours prior to surgery.  No nicotine patches on the day of surgery.  On the morning of surgery brush your teeth with toothpaste and water, you may rinse your mouth with mouthwash if you wish. Do not swallow any toothpaste or mouthwash.  Notify your doctor if there is any change in your medical condition (cold, fever, infection).  Do not wear jewelry, make-up, hairpins, clips or nail polish.  Do not wear lotions, powders, or perfumes.   Do not shave 48 hours prior to surgery.   Contacts and dentures may not be worn into surgery.  Do not bring valuables to the hospital, including drivers license, insurance or credit cards.  [facility] is not responsible for any belongings or valuables.   TAKE THESE MEDICATIONS THE MORNING OF SURGERY:  1.  Omeprazole - (take one the night before and one on the morning of surgery - helps to  prevent nausea after surgery.)  Use CHG Soap as directed on instruction sheet.  Now!  Stop aspirin and Anti-inflammatories (NSAIDS) such as Advil, Aleve, Ibuprofen, Motrin, Naproxen, Naprosyn and Aspirin based products such as Excedrin, Goodys Powder, BC Powder. (May take Tylenol or Acetaminophen if needed.)  Now!  Stop ANY OVER THE COUNTER supplements until after surgery. Coenzyme, fish oil) (May continue Vitamin D, Vitamin B, and multivitamin.)  Wear comfortable clothing (specific to your surgery type) to the hospital.  Plan for stool softeners for home use.  Please call [phone] if you have any questions about these instructions.

## 2019-01-02 ENCOUNTER — Other Ambulatory Visit: Payer: PPO

## 2019-01-05 LAB — PREPARE RBC (CROSSMATCH)

## 2019-01-06 ENCOUNTER — Inpatient Hospital Stay: Payer: PPO | Admitting: Anesthesiology

## 2019-01-06 ENCOUNTER — Inpatient Hospital Stay: Payer: PPO

## 2019-01-06 ENCOUNTER — Inpatient Hospital Stay
Admission: RE | Admit: 2019-01-06 | Discharge: 2019-01-09 | DRG: 164 | Disposition: A | Discharge status: Home / Self Care | Payer: PPO | Attending: Cardiothoracic Surgery | Admitting: Cardiothoracic Surgery

## 2019-01-06 ENCOUNTER — Other Ambulatory Visit: Payer: Self-pay

## 2019-01-06 ENCOUNTER — Encounter: Payer: Self-pay | Admitting: Cardiothoracic Surgery

## 2019-01-06 ENCOUNTER — Encounter
Admission: RE | Disposition: A | Discharge status: Home / Self Care | Payer: Self-pay | Source: Home / Self Care | Attending: Cardiothoracic Surgery

## 2019-01-06 DIAGNOSIS — R918 Other nonspecific abnormal finding of lung field: Secondary | ICD-10-CM | POA: Diagnosis not present

## 2019-01-06 DIAGNOSIS — E785 Hyperlipidemia, unspecified: Secondary | ICD-10-CM | POA: Diagnosis not present

## 2019-01-06 DIAGNOSIS — Z4682 Encounter for fitting and adjustment of non-vascular catheter: Secondary | ICD-10-CM | POA: Diagnosis not present

## 2019-01-06 DIAGNOSIS — J95812 Postprocedural air leak: Secondary | ICD-10-CM | POA: Diagnosis not present

## 2019-01-06 DIAGNOSIS — Z09 Encounter for follow-up examination after completed treatment for conditions other than malignant neoplasm: Secondary | ICD-10-CM

## 2019-01-06 DIAGNOSIS — R1310 Dysphagia, unspecified: Secondary | ICD-10-CM | POA: Diagnosis present

## 2019-01-06 DIAGNOSIS — J439 Emphysema, unspecified: Secondary | ICD-10-CM | POA: Diagnosis not present

## 2019-01-06 DIAGNOSIS — J852 Abscess of lung without pneumonia: Secondary | ICD-10-CM

## 2019-01-06 DIAGNOSIS — K449 Diaphragmatic hernia without obstruction or gangrene: Secondary | ICD-10-CM | POA: Diagnosis not present

## 2019-01-06 DIAGNOSIS — Z87891 Personal history of nicotine dependence: Secondary | ICD-10-CM

## 2019-01-06 DIAGNOSIS — Z23 Encounter for immunization: Secondary | ICD-10-CM

## 2019-01-06 DIAGNOSIS — D381 Neoplasm of uncertain behavior of trachea, bronchus and lung: Secondary | ICD-10-CM | POA: Diagnosis not present

## 2019-01-06 HISTORY — PX: THORACOTOMY: SHX5074

## 2019-01-06 HISTORY — PX: FLEXIBLE BRONCHOSCOPY: SHX5094

## 2019-01-06 HISTORY — DX: Abscess of lung without pneumonia: J85.2

## 2019-01-06 HISTORY — PX: VIDEO ASSISTED THORACOSCOPY (VATS)/WEDGE RESECTION: SHX6174

## 2019-01-06 LAB — MRSA PCR SCREENING: MRSA by PCR: NEGATIVE

## 2019-01-06 SURGERY — THORACOTOMY, MAJOR
Anesthesia: General | Laterality: Right

## 2019-01-06 SURGERY — Surgical Case
Anesthesia: *Unknown

## 2019-01-06 MED ORDER — CEFAZOLIN SODIUM-DEXTROSE 2-4 GM/100ML-% IV SOLN
2.0000 g | INTRAVENOUS | Status: AC
  Administered 2019-01-06: 2 g via INTRAVENOUS

## 2019-01-06 MED ORDER — PHENYLEPHRINE HCL (PRESSORS) 10 MG/ML IV SOLN
INTRAVENOUS | Status: DC | PRN
  Administered 2019-01-06: 50 ug via INTRAVENOUS

## 2019-01-06 MED ORDER — ONDANSETRON HCL 4 MG/2ML IJ SOLN
INTRAMUSCULAR | Status: AC
  Filled 2019-01-06: qty 2

## 2019-01-06 MED ORDER — ALBUTEROL SULFATE (2.5 MG/3ML) 0.083% IN NEBU
2.5000 mg | INHALATION_SOLUTION | RESPIRATORY_TRACT | Status: DC
  Administered 2019-01-07 (×3): 2.5 mg via RESPIRATORY_TRACT
  Filled 2019-01-06 (×3): qty 3

## 2019-01-06 MED ORDER — DIVALPROEX SODIUM ER 500 MG PO TB24
1000.0000 mg | ORAL_TABLET | Freq: Every day | ORAL | Status: DC
  Administered 2019-01-07 – 2019-01-09 (×3): 1000 mg via ORAL
  Filled 2019-01-06 (×4): qty 2

## 2019-01-06 MED ORDER — ONDANSETRON HCL 4 MG/2ML IJ SOLN: 4.0000 mg | Freq: Four times a day (QID) | INTRAMUSCULAR | Status: DC | PRN

## 2019-01-06 MED ORDER — MIDAZOLAM HCL 2 MG/2ML IJ SOLN
INTRAMUSCULAR | Status: AC
  Filled 2019-01-06: qty 2

## 2019-01-06 MED ORDER — SODIUM CHLORIDE (PF) 0.9 % IJ SOLN: INTRAMUSCULAR | Status: DC | PRN

## 2019-01-06 MED ORDER — MIDAZOLAM HCL 2 MG/2ML IJ SOLN
INTRAMUSCULAR | Status: DC | PRN
  Administered 2019-01-06: 2 mg via INTRAVENOUS

## 2019-01-06 MED ORDER — CEFAZOLIN SODIUM-DEXTROSE 2-4 GM/100ML-% IV SOLN
INTRAVENOUS | Status: AC
  Filled 2019-01-06: qty 100

## 2019-01-06 MED ORDER — ROCURONIUM BROMIDE 100 MG/10ML IV SOLN
INTRAVENOUS | Status: DC | PRN
  Administered 2019-01-06: 50 mg via INTRAVENOUS
  Administered 2019-01-06: 30 mg via INTRAVENOUS

## 2019-01-06 MED ORDER — ROCURONIUM BROMIDE 50 MG/5ML IV SOLN
INTRAVENOUS | Status: AC
  Filled 2019-01-06: qty 1

## 2019-01-06 MED ORDER — PNEUMOCOCCAL VAC POLYVALENT 25 MCG/0.5ML IJ INJ
0.5000 mL | INJECTION | INTRAMUSCULAR | Status: AC
  Administered 2019-01-07: 0.5 mL via INTRAMUSCULAR
  Filled 2019-01-06: qty 0.5

## 2019-01-06 MED ORDER — FENTANYL CITRATE (PF) 100 MCG/2ML IJ SOLN
INTRAMUSCULAR | Status: AC
  Filled 2019-01-06: qty 2

## 2019-01-06 MED ORDER — SUGAMMADEX SODIUM 200 MG/2ML IV SOLN
INTRAVENOUS | Status: AC
  Filled 2019-01-06: qty 2

## 2019-01-06 MED ORDER — CHLORHEXIDINE GLUCONATE CLOTH 2 % EX PADS: 6.0000 | MEDICATED_PAD | Freq: Once | CUTANEOUS | Status: DC

## 2019-01-06 MED ORDER — DEXTROSE-NACL 5-0.45 % IV SOLN: INTRAVENOUS | Status: DC

## 2019-01-06 MED ORDER — ONDANSETRON HCL 4 MG/2ML IJ SOLN: 4.0000 mg | Freq: Once | INTRAMUSCULAR | Status: DC | PRN

## 2019-01-06 MED ORDER — SODIUM CHLORIDE 0.9 % IV SOLN
INTRAVENOUS | Status: DC | PRN
  Administered 2019-01-06: 70 mL

## 2019-01-06 MED ORDER — TRAMADOL HCL 50 MG PO TABS
50.0000 mg | ORAL_TABLET | Freq: Four times a day (QID) | ORAL | Status: DC
  Administered 2019-01-06 – 2019-01-08 (×7): 50 mg via ORAL
  Administered 2019-01-08 (×2): 100 mg via ORAL
  Administered 2019-01-09 (×3): 50 mg via ORAL
  Filled 2019-01-06 (×3): qty 1
  Filled 2019-01-06: qty 2
  Filled 2019-01-06 (×7): qty 1
  Filled 2019-01-06: qty 2

## 2019-01-06 MED ORDER — FENTANYL CITRATE (PF) 100 MCG/2ML IJ SOLN
25.0000 ug | INTRAMUSCULAR | Status: DC | PRN
  Administered 2019-01-06 (×4): 25 ug via INTRAVENOUS

## 2019-01-06 MED ORDER — FENTANYL CITRATE (PF) 250 MCG/5ML IJ SOLN
INTRAMUSCULAR | Status: AC
  Filled 2019-01-06: qty 5

## 2019-01-06 MED ORDER — BUPIVACAINE HCL (PF) 0.25 % IJ SOLN
INTRAMUSCULAR | Status: AC
  Filled 2019-01-06: qty 30

## 2019-01-06 MED ORDER — PROPOFOL 10 MG/ML IV BOLUS
INTRAVENOUS | Status: DC | PRN
  Administered 2019-01-06: 110 mg via INTRAVENOUS

## 2019-01-06 MED ORDER — MORPHINE SULFATE (PF) 2 MG/ML IV SOLN: 1.0000 mg | INTRAVENOUS | Status: DC | PRN

## 2019-01-06 MED ORDER — CEFAZOLIN SODIUM-DEXTROSE 1-4 GM/50ML-% IV SOLN
1.0000 g | Freq: Three times a day (TID) | INTRAVENOUS | Status: AC
  Administered 2019-01-06 – 2019-01-07 (×3): 1 g via INTRAVENOUS
  Filled 2019-01-06 (×3): qty 50

## 2019-01-06 MED ORDER — SUGAMMADEX SODIUM 200 MG/2ML IV SOLN
INTRAVENOUS | Status: DC | PRN
  Administered 2019-01-06: 150 mg via INTRAVENOUS

## 2019-01-06 MED ORDER — LACTATED RINGERS IV SOLN: INTRAVENOUS | Status: DC

## 2019-01-06 MED ORDER — ONDANSETRON HCL 4 MG/2ML IJ SOLN
INTRAMUSCULAR | Status: DC | PRN
  Administered 2019-01-06: 4 mg via INTRAVENOUS

## 2019-01-06 MED ORDER — DEXAMETHASONE SODIUM PHOSPHATE 10 MG/ML IJ SOLN
INTRAMUSCULAR | Status: DC | PRN
  Administered 2019-01-06: 10 mg via INTRAVENOUS

## 2019-01-06 MED ORDER — FENTANYL CITRATE (PF) 100 MCG/2ML IJ SOLN
INTRAMUSCULAR | Status: DC | PRN
  Administered 2019-01-06: 100 ug via INTRAVENOUS
  Administered 2019-01-06 (×2): 25 ug via INTRAVENOUS
  Administered 2019-01-06 (×2): 50 ug via INTRAVENOUS

## 2019-01-06 MED ORDER — BUPIVACAINE LIPOSOME 1.3 % IJ SUSP
INTRAMUSCULAR | Status: AC
  Filled 2019-01-06: qty 20

## 2019-01-06 MED ORDER — BUPIVACAINE HCL 0.25 % IJ SOLN
INTRAMUSCULAR | Status: DC | PRN
  Administered 2019-01-06: 30 mL

## 2019-01-06 MED ORDER — PROPOFOL 10 MG/ML IV BOLUS
INTRAVENOUS | Status: AC
  Filled 2019-01-06: qty 20

## 2019-01-06 MED ORDER — BISACODYL 5 MG PO TBEC
10.0000 mg | DELAYED_RELEASE_TABLET | Freq: Every day | ORAL | Status: DC
  Administered 2019-01-07 – 2019-01-09 (×3): 10 mg via ORAL
  Filled 2019-01-06 (×3): qty 2

## 2019-01-06 MED ORDER — SODIUM CHLORIDE (PF) 0.9 % IJ SOLN
INTRAMUSCULAR | Status: AC
  Filled 2019-01-06: qty 50

## 2019-01-06 MED ORDER — CHLORHEXIDINE GLUCONATE CLOTH 2 % EX PADS
6.0000 | MEDICATED_PAD | Freq: Every day | CUTANEOUS | Status: DC
  Administered 2019-01-07 – 2019-01-09 (×3): 6 via TOPICAL

## 2019-01-06 SURGICAL SUPPLY — 90 items
APL PRP STRL LF DISP 70% ISPRP (MISCELLANEOUS) ×4
APL SKNCLS STERI-STRIP NONHPOA (GAUZE/BANDAGES/DRESSINGS)
BENZOIN TINCTURE PRP APPL 2/3 (GAUZE/BANDAGES/DRESSINGS) ×2 IMPLANT
BNDG COHESIVE 4X5 TAN STRL (GAUZE/BANDAGES/DRESSINGS) IMPLANT
BRONCHOSCOPE PED SLIM DISP (MISCELLANEOUS) ×3 IMPLANT
BULB RESERV EVAC DRAIN JP 100C (MISCELLANEOUS) ×1 IMPLANT
CANISTER SUCT 1200ML W/VALVE (MISCELLANEOUS) ×3 IMPLANT
CATH  RT ANGL  28F  SOF (CATHETERS) ×1
CATH RT ANGL 28F SOF (CATHETERS) IMPLANT
CATH THOR STR 28F  SOFT WA (CATHETERS) ×1
CATH THOR STR 28F SOFT WA (CATHETERS) IMPLANT
CATH URET ROBINSON 16FR STRL (CATHETERS) ×3 IMPLANT
CHLORAPREP W/TINT 26 (MISCELLANEOUS) ×6 IMPLANT
CNTNR SPEC 2.5X3XGRAD LEK (MISCELLANEOUS) ×4
CONN REDUCER 3/8X3/8X3/8Y (CONNECTOR) ×3
CONNECTOR REDUCER 3/8X3/8 (MISCELLANEOUS) ×2 IMPLANT
CONNECTOR REDUCER 3/8X3/8X3/8Y (CONNECTOR) IMPLANT
CONT SPEC 4OZ STER OR WHT (MISCELLANEOUS) ×2
CONT SPEC 4OZ STRL OR WHT (MISCELLANEOUS) ×4
CONTAINER SPEC 2.5X3XGRAD LEK (MISCELLANEOUS) ×8 IMPLANT
CUTTER ECHEON FLEX ENDO 45 340 (ENDOMECHANICALS) IMPLANT
DRAIN CHEST DRY SUCT SGL (MISCELLANEOUS) ×3 IMPLANT
DRAPE C-SECTION (MISCELLANEOUS) ×3 IMPLANT
DRAPE MAG INST 16X20 L/F (DRAPES) ×3 IMPLANT
DRSG OPSITE POSTOP 4X10 (GAUZE/BANDAGES/DRESSINGS) ×1 IMPLANT
DRSG OPSITE POSTOP 4X6 (GAUZE/BANDAGES/DRESSINGS) ×2 IMPLANT
DRSG OPSITE POSTOP 4X8 (GAUZE/BANDAGES/DRESSINGS) ×3 IMPLANT
DRSG TEGADERM 2-3/8X2-3/4 SM (GAUZE/BANDAGES/DRESSINGS) ×1 IMPLANT
DRSG TELFA 3X8 NADH (GAUZE/BANDAGES/DRESSINGS) ×3 IMPLANT
DRSG TELFA 4X3 1S NADH ST (GAUZE/BANDAGES/DRESSINGS) ×1 IMPLANT
ELECT BLADE 6.5 EXT (BLADE) ×4 IMPLANT
ELECT CAUTERY BLADE TIP 2.5 (TIP) ×3
ELECT REM PT RETURN 9FT ADLT (ELECTROSURGICAL) ×3
ELECTRODE CAUTERY BLDE TIP 2.5 (TIP) ×2 IMPLANT
ELECTRODE REM PT RTRN 9FT ADLT (ELECTROSURGICAL) ×2 IMPLANT
GAUZE SPONGE 4X4 12PLY STRL (GAUZE/BANDAGES/DRESSINGS) ×3 IMPLANT
GLOVE SURG SYN 7.5  E (GLOVE) ×2
GLOVE SURG SYN 7.5 E (GLOVE) ×4 IMPLANT
GLOVE SURG SYN 7.5 PF PI (GLOVE) ×4 IMPLANT
GOWN STRL REUS W/ TWL LRG LVL3 (GOWN DISPOSABLE) ×6 IMPLANT
GOWN STRL REUS W/TWL LRG LVL3 (GOWN DISPOSABLE) ×9
KIT TURNOVER KIT A (KITS) ×3 IMPLANT
L-HOOK LAP DISP 36CM (ELECTROSURGICAL) ×3
LABEL OR SOLS (LABEL) ×3 IMPLANT
LHOOK LAP DISP 36CM (ELECTROSURGICAL) IMPLANT
LOOP RED MAXI  1X406MM (MISCELLANEOUS)
LOOP VESSEL MAXI 1X406 RED (MISCELLANEOUS) ×2 IMPLANT
MARKER SKIN DUAL TIP RULER LAB (MISCELLANEOUS) ×3 IMPLANT
NDL HYPO 25X1 1.5 SAFETY (NEEDLE) IMPLANT
NDL SPNL 22GX3.5 QUINCKE BK (NEEDLE) ×2 IMPLANT
NEEDLE HYPO 25X1 1.5 SAFETY (NEEDLE) ×3 IMPLANT
NEEDLE SPNL 22GX3.5 QUINCKE BK (NEEDLE) ×3 IMPLANT
PACK BASIN MAJOR ARMC (MISCELLANEOUS) ×3 IMPLANT
PAD DRESSING TELFA 3X8 NADH (GAUZE/BANDAGES/DRESSINGS) ×2 IMPLANT
RELOAD PROXIMATE TA60MM GREEN (ENDOMECHANICALS) IMPLANT
RELOAD STAPLE 35X2.5 WHT THIN (STAPLE) ×8 IMPLANT
RELOAD STAPLE 60 3.8 GOLD REG (STAPLE) IMPLANT
RELOAD STAPLE 60 4.7 GRN THCK (ENDOMECHANICALS) IMPLANT
RELOAD STAPLER GOLD 60MM (STAPLE) ×8 IMPLANT
RELOAD STAPLER LINE PROX 30 GR (STAPLE) IMPLANT
SPONGE KITTNER 5P (MISCELLANEOUS) ×3 IMPLANT
STAPLE ECHEON FLEX 60 POW ENDO (STAPLE) ×1 IMPLANT
STAPLE RELOAD 2.5MM WHITE (STAPLE) IMPLANT
STAPLER RELOAD GOLD 60MM (STAPLE) ×12
STAPLER RELOAD LINE PROX 30 GR (STAPLE)
STAPLER RELOADABLE 30 GRN THCK (STAPLE) ×2 IMPLANT
STAPLER SKIN PROX 35W (STAPLE) ×3 IMPLANT
STAPLER VASCULAR ECHELON 35 (CUTTER) IMPLANT
STRIP CLOSURE SKIN 1/2X4 (GAUZE/BANDAGES/DRESSINGS) ×3 IMPLANT
SUT ETHILON 4-0 (SUTURE) ×3
SUT ETHILON 4-0 FS2 18XMFL BLK (SUTURE) ×2
SUT MNCRL AB 3-0 PS2 27 (SUTURE) IMPLANT
SUT PROLENE 5 0 RB 1 DA (SUTURE) IMPLANT
SUT SILK 0 (SUTURE) ×3
SUT SILK 0 30XBRD TIE 6 (SUTURE) ×2 IMPLANT
SUT SILK 1 SH (SUTURE) ×21 IMPLANT
SUT VIC AB 0 CT1 36 (SUTURE) ×6 IMPLANT
SUT VIC AB 2-0 CT1 27 (SUTURE) ×6
SUT VIC AB 2-0 CT1 TAPERPNT 27 (SUTURE) ×4 IMPLANT
SUT VICRYL 2 TP 1 (SUTURE) ×9 IMPLANT
SUTURE ETHLN 4-0 FS2 18XMF BLK (SUTURE) IMPLANT
SYR 10ML SLIP (SYRINGE) ×3 IMPLANT
SYR BULB IRRIG 60ML STRL (SYRINGE) ×3 IMPLANT
TAPE CLOTH 3X10 WHT NS LF (GAUZE/BANDAGES/DRESSINGS) ×3 IMPLANT
TAPE TRANSPORE STRL 2 31045 (GAUZE/BANDAGES/DRESSINGS) IMPLANT
TRAY FOLEY MTR SLVR 16FR STAT (SET/KITS/TRAYS/PACK) ×3 IMPLANT
TROCAR FLEXIPATH 20X80 (ENDOMECHANICALS) ×1 IMPLANT
TUBING CONNECTING 10 (TUBING) ×3 IMPLANT
WATER STERILE IRR 1000ML POUR (IV SOLUTION) ×3 IMPLANT
YANKAUER SUCT BULB TIP FLEX NO (MISCELLANEOUS) ×3 IMPLANT

## 2019-01-06 NOTE — Op Note (Signed)
  01/06/2019  4:54 PM  PATIENT:  [name]  73 y.o. male  PRE-OPERATIVE DIAGNOSIS: Right upper lobe and right lower lobe lung mass  POST-OPERATIVE DIAGNOSIS: Frozen section diagnosis of lung abscess right upper and right lower lobe  PROCEDURE: Preoperative bronchoscopy to assess endobronchial anatomy; muscle-sparing right thoracotomy with wedge resection of right upper and right lower lobe masses  SURGEON:  Surgeon(s) and Role:    [name], MD - Primary    * Fredirick Maudlin, MD  ASSISTANTS: Vennie Homans PAS  ANESTHESIA: General  INDICATIONS FOR PROCEDURE this patient is a 73 year old gentleman who had previously undergone a left thoracotomy for lung abscess many years ago.  He has been followed for about a year with enlarging right upper right lower lobe masses.  These were cavitary on the CT scan but were felt to be most consistent with bronchogenic carcinoma.  For this reason he was offered the above named procedure for definitive diagnosis and treatment the indications and risks were explained the patient gives informed consent  DICTATION: The patient was brought to the operating suite and placed in the supine position.  General endotracheal anesthesia was given through a double-lumen tube.  Preoperative bronchoscopy was carried out to the subsegmental levels bilaterally.  There is no evidence of endobronchial tumor, hemoptysis, purulent secretions.  The tube was secured at the appropriate spot in the left mainstem bronchus and the patient was then turned for a right thoracotomy.  All pressure points were carefully padded.  The patient was prepped and draped in usual sterile fashion.  A muscle-sparing right thoracotomy was performed.  The chest was entered through approximately the fifth or sixth intercostal space.  There was a large hiatal hernia with a fair amount of the stomach within the right chest.  The inferior pulmonary ligament was divided up to the level of the  inferior pulmonary vein.  Careful palpation of the lung revealed 2 lesions one in the right lower lobe and one in the right upper lobe.  There were extensive adhesions to the apex of the lung over the subclavian artery.  These were taken down for the most part to free up the inferior portion of the lung.  We then decided to wedge both of these lesions from the lung.  Using multiple firings of an endoscopic stapler we excised both the right upper and the right lower lobe lesions.  An adequate margin was taken in case these were malignancies.  Both specimens were sent to pathology and frozen section confirmed the diagnosis of lung abscess without definitive evidence of malignancy.  At that point we elected to close.  An angled and a straight 24 French chest tube were placed in standard fashion.  These were brought out through separate stab wounds.  The chest was irrigated and hemostasis was complete.  The ribs were then reapproximated with #2 Vicryl.  The chest was closed.  The serratus anterior and the latissimus muscle were allowed to return to their normal anatomic positions.  Scarpa's fascia was closed with a running Vicryl stitch and the skin with skin clips.  All sponge needle and instrument counts were correct as reported to me at the end of the case.  Patient was rolled in the supine position where he was extubated and taken to the recovery room in stable condition.   [name], MD

## 2019-01-06 NOTE — Transfer of Care (Signed)
Immediate Anesthesia Transfer of Care Note  Patient: [name]  Procedure(s) Performed: THORACOTOMY MAJOR (Right ) VIDEO ASSISTED THORACOSCOPY (VATS)/WEDGE RESECTION-RUL and RLL-lung resection (Right ) FLEXIBLE BRONCHOSCOPY ([name] )  Patient Location: PACU  Anesthesia Type:General  Level of Consciousness: awake and alert   Airway & Oxygen Therapy: Patient Spontanous Breathing and Patient connected to face mask oxygen  Post-op Assessment: Report given to RN and Post -op Vital signs reviewed and stable  Post vital signs: Reviewed and stable  Last Vitals:  Vitals Value Taken Time  BP 183/96 01/06/19 1647  Temp    Pulse 82 01/06/19 1651  Resp 17 01/06/19 1651  SpO2 99 % 01/06/19 1651  Vitals shown include unvalidated device data.  Last Pain:  Vitals:   01/06/19 1105  TempSrc: Temporal  PainSc: 0-No pain         Complications: No apparent anesthesia complications

## 2019-01-06 NOTE — Anesthesia Procedure Notes (Signed)
Procedure Name: Intubation Date/Time: 01/06/2019 1:22 PM Performed by: Gentry Fitz, CRNA Pre-anesthesia Checklist: Patient identified, Emergency Drugs available, Suction available, Patient being monitored and Timeout performed Patient Re-evaluated:Patient Re-evaluated prior to induction Oxygen Delivery Method: Circle system utilized Preoxygenation: Pre-oxygenation with 100% oxygen Induction Type: IV induction Ventilation: Mask ventilation without difficulty Laryngoscope Size: Mac and 4 Grade View: Grade I Endobronchial tube: EBT position confirmed by fiberoptic bronchoscope, Double lumen EBT and Left Number of attempts: 1 Airway Equipment and Method: Stylet Placement Confirmation: ETT inserted through vocal cords under direct vision,  positive ETCO2 and breath sounds checked- equal and bilateral Secured at: 39 cm Tube secured with: Tape Dental Injury: Teeth and Oropharynx as per pre-operative assessment

## 2019-01-06 NOTE — Interval H&P Note (Signed)
History and Physical Interval Note:  01/06/2019 12:20 PM  [name]  has presented today for surgery, with the diagnosis of lung mass-Right.  The various methods of treatment have been discussed with the patient and family. After consideration of risks, benefits and other options for treatment, the patient has consented to  Procedure(s): THORACOTOMY MAJOR (Right) VIDEO ASSISTED THORACOSCOPY (VATS)/WEDGE RESECTION-RUL and RLL-lung resection (Right) FLEXIBLE BRONCHOSCOPY ([name]) as a surgical intervention.  The patient's history has been reviewed, patient examined, no change in status, stable for surgery.  I have reviewed the patient's chart and labs.  Questions were answered to the patient's satisfaction.     [name]

## 2019-01-06 NOTE — Anesthesia Postprocedure Evaluation (Signed)
Anesthesia Post Note  Patient: [name]  Procedure(s) Performed: THORACOTOMY MAJOR (Right ) VIDEO ASSISTED THORACOSCOPY (VATS)/WEDGE RESECTION-RUL and RLL-lung resection (Right ) FLEXIBLE BRONCHOSCOPY ([name] )  Patient location during evaluation: PACU Anesthesia Type: General Level of consciousness: awake and alert Pain management: pain level controlled Vital Signs Assessment: post-procedure vital signs reviewed and stable Respiratory status: spontaneous breathing, nonlabored ventilation, respiratory function stable and patient connected to nasal cannula oxygen Cardiovascular status: blood pressure returned to baseline and stable Postop Assessment: no apparent nausea or vomiting Anesthetic complications: no     Last Vitals:  Vitals:   01/06/19 1748 01/06/19 1800  BP: (!) 177/91 (!) 168/84  Pulse: 82 84  Resp: 15 16  Temp: (!) 36.2 C   SpO2: 100% 100%    Last Pain:  Vitals:   01/06/19 1800  TempSrc:   PainSc: [place]

## 2019-01-06 NOTE — Anesthesia Post-op Follow-up Note (Signed)
Anesthesia QCDR form completed.        

## 2019-01-06 NOTE — Anesthesia Preprocedure Evaluation (Addendum)
Anesthesia Evaluation  Patient identified by MRN, date of birth, ID band Patient awake    Reviewed: Allergy & Precautions, H&P , NPO status , Patient's Chart, lab work & pertinent test results, reviewed documented beta blocker date and time   History of Anesthesia Complications Negative for: history of anesthetic complications  Airway Mallampati: I  TM Distance: >3 FB Neck ROM: full    Dental  (+) Dental Advidsory Given, Edentulous Upper, Edentulous Lower   Pulmonary neg shortness of breath, COPD,  COPD inhaler, neg recent URI, former smoker,    Pulmonary exam normal        Cardiovascular Exercise Tolerance: Good negative cardio ROS Normal cardiovascular exam     Neuro/Psych PSYCHIATRIC DISORDERS Anxiety Depression Bipolar Disorder negative neurological ROS     GI/Hepatic Neg liver ROS, hiatal hernia, GERD  ,  Endo/Other  negative endocrine ROS  Renal/GU negative Renal ROS  negative genitourinary   Musculoskeletal   Abdominal   Peds  Hematology negative hematology ROS (+)   Anesthesia Other Findings Past Medical History: No date: Arthritis     Comment:  in his fingers No date: Bipolar disorder (HCC) No date: BPH (benign prostatic hyperplasia) No date: Depression No date: Emphysema No date: Episodic confusion     Comment:  Possible bipolar disorder No date: GERD (gastroesophageal reflux disease) No date: History of chicken pox No date: History of hiatal hernia No date: HNP (herniated nucleus pulposus), lumbar No date: Hyperlipidemia 07/31/2012: Memory deficit No date: OCD (obsessive compulsive disorder) No date: Peripheral neuropathy 01/10/2003: Restless leg syndrome 03/01/2012: Unspecified psychosis   Reproductive/Obstetrics negative OB ROS                             Anesthesia Physical Anesthesia Plan  ASA: III  Anesthesia Plan: General   Post-op Pain Management:     Induction: Intravenous  PONV Risk Score and Plan: 2 and Ondansetron, Dexamethasone and Treatment may vary due to age or medical condition  Airway Management Planned: Double Lumen EBT  Additional Equipment:   Intra-op Plan:   Post-operative Plan: Extubation in OR and Possible Post-op intubation/ventilation  Informed Consent: I have reviewed the patients History and Physical, chart, labs and discussed the procedure including the risks, benefits and alternatives for the proposed anesthesia with the patient or authorized representative who has indicated his/her understanding and acceptance.     Dental Advisory Given  Plan Discussed with: Anesthesiologist, CRNA and Surgeon  Anesthesia Plan Comments:         Anesthesia Quick Evaluation

## 2019-01-07 LAB — TYPE AND SCREEN
ABO/RH(D): B POS
Antibody Screen: NEGATIVE
Unit division: 0
Unit division: 0

## 2019-01-07 LAB — BPAM RBC
Blood Product Expiration Date: 202101232359
Blood Product Expiration Date: 202101232359
Unit Type and Rh: 5100
Unit Type and Rh: 5100

## 2019-01-07 LAB — PREPARE RBC (CROSSMATCH)

## 2019-01-07 MED ORDER — ALBUTEROL SULFATE (2.5 MG/3ML) 0.083% IN NEBU
2.5000 mg | INHALATION_SOLUTION | Freq: Four times a day (QID) | RESPIRATORY_TRACT | Status: DC
  Administered 2019-01-07 – 2019-01-09 (×7): 2.5 mg via RESPIRATORY_TRACT
  Filled 2019-01-07 (×7): qty 3

## 2019-01-07 MED ORDER — HYDROCODONE-ACETAMINOPHEN 5-325 MG PO TABS
1.0000 | ORAL_TABLET | ORAL | Status: DC | PRN
  Administered 2019-01-07: 2 via ORAL
  Filled 2019-01-07: qty 2

## 2019-01-07 NOTE — Progress Notes (Signed)
Didn't sleep well.  Had some discomfort  VSS, Afeb  UO good  Minimal air leak.  Serous JP and chest Tube drainage  Wounds clean and dry Lungs equal and clear Heart regular  Will trransfer to floor Ambulate in halls Adjust oral pain meds Regular diet  [employer]

## 2019-01-07 NOTE — Plan of Care (Signed)
Pt up to chair with assist x 1, foley DC per Surgeon, pt verbalizes understanding of POC, CT w/ sanguineous drainage, minimal into JP.  Sister visited

## 2019-01-07 NOTE — Evaluation (Signed)
Physical Therapy Evaluation Patient Details Name: [name] MRN: [id number] DOB: 15-May-1945 Today's Date: 01/07/2019   History of Present Illness  Per MD notes: Pt is a 73 year old gentleman with a long smoking history who quit about a month ago.  PMH includes COPD, bipolar disorder, depression/anxiety, hiatal hernia, OCD, and peripheral neuropathy.  He saw Dr. Woody Seller for both upper and lower lobe nodules.  He was placed on antibiotics and a repeat CT scan was performed.  The CT scan which she had made just a few days ago showed continued enlargement of the peripheral right upper and peripheral right lower lobe nodules that were felt to be consistent with malignancy.  The patient does have a hiatal hernia and has had difficulty swallowing.  Pt now s/p muscle-sparing right thoracotomy with wedge resection of right upper and right lower lobe masses.    Clinical Impression  Pt presented with deficits in strength, transfers, mobility, gait, balance, and activity tolerance.  Pt was found in a recliner with bed mobility NT this session but pt reported needing "a little" help from nursing to get out of the bed and into a chair.  Pt was steady with transfers with good control and was able to amb 80 feet with SpO2 93-94% on room air and no adverse symptoms reported.  Pt steady during amb with reciprocal gait with the use of a RW.  Pt will benefit from HHPT services setting upon discharge to safely address above deficits for decreased caregiver assistance and eventual return to PLOF.      Follow Up Recommendations Home health PT;Supervision - Intermittent    Equipment Recommendations  3in1 (PT);Other (comment)(Need to confirm if pt already has a BSC)    Recommendations for Other Services       Precautions / Restrictions Precautions Precaution Comments: R chest tube; OK to put to water seal for ambulation; R JP drain Restrictions Weight Bearing Restrictions: No Other Position/Activity  Restrictions: HOB to 45 deg; SpO2 >/= 90%      Mobility  Bed Mobility               General bed mobility comments: NT, pt found in recliner  Transfers Overall transfer level: Needs assistance Equipment used: Rolling walker (2 wheeled) Transfers: Sit to/from Stand Sit to Stand: Supervision         General transfer comment: Good control and stability with transfers  Ambulation/Gait Ambulation/Gait assistance: Supervision Gait Distance (Feet): 80 Feet Assistive device: Rolling walker (2 wheeled) Gait Pattern/deviations: Step-through pattern;Decreased step length - right;Decreased step length - left Gait velocity: decreased   General Gait Details: Short B step length and slow cadence but steady without LOB and with HR and SpO2 on room air WNL  Stairs            Wheelchair Mobility    Modified Rankin (Stroke Patients Only)       Balance Overall balance assessment: Needs assistance   Sitting balance-Leahy Scale: Normal     Standing balance support: Bilateral upper extremity supported;During functional activity Standing balance-Leahy Scale: Good                               Pertinent Vitals/Pain Pain Assessment: 0-10 Pain Score: 6  Pain Location: Right chest Pain Descriptors / Indicators: Aching;Sore Pain Intervention(s): Premedicated before session;Monitored during session    [place] expects to be discharged to:: Private residence Living Arrangements: Alone Available Help at  Discharge: Family;Available PRN/intermittently Type of Home: House Home Access: Stairs to enter Entrance Stairs-Rails: None Entrance Stairs-Number of Steps: One small threshold step for home access Home Layout: One level Home Equipment: Cane - single point;Walker - 2 wheels;Walker - 4 wheels;Grab bars - tub/shower      Prior Function Level of Independence: Independent         Comments: Ind Amb limited community distances without an AD,  no fall history, Ind wtih ADLs; sister may be able to stay with pt upon discharge but if not will be able to check patient daily     Hand Dominance   Dominant Hand: Right    Extremity/Trunk Assessment   Upper Extremity Assessment Upper Extremity Assessment: Overall WFL for tasks assessed    Lower Extremity Assessment Lower Extremity Assessment: Generalized weakness       Communication   Communication: HOH  Cognition Arousal/Alertness: Awake/alert Behavior During Therapy: WFL for tasks assessed/performed Overall Cognitive Status: Within Functional Limits for tasks assessed                                        General Comments      Exercises Total Joint Exercises Ankle Circles/Pumps: Strengthening;Both;10 reps Quad Sets: Strengthening;Both;10 reps Gluteal Sets: Strengthening;Both;10 reps Hip ABduction/ADduction: AROM;Both;5 reps Long Arc Quad: Strengthening;Both;10 reps Knee Flexion: Strengthening;Both;10 reps Marching in Standing: AROM;Both;10 reps;Standing   Assessment/Plan    PT Assessment Patient needs continued PT services  PT Problem List Decreased strength;Decreased activity tolerance;Decreased balance;Decreased mobility;Decreased knowledge of use of DME       PT Treatment Interventions DME instruction;Gait training;Stair training;Functional mobility training;Therapeutic activities;Therapeutic exercise;Balance training;Patient/family education    PT Goals (Current goals can be found in the Care Plan section)  Acute Rehab PT Goals Patient Stated Goal: To get back home PT Goal Formulation: With patient Time For Goal Achievement: 01/20/19 Potential to Achieve Goals: Good    Frequency Min 2X/week   Barriers to discharge        Co-evaluation               AM-PAC PT "6 Clicks" Mobility  Outcome Measure Help needed turning from your back to your side while in a flat bed without using bedrails?: A Little Help needed moving from  lying on your back to sitting on the side of a flat bed without using bedrails?: A Little Help needed moving to and from a bed to a chair (including a wheelchair)?: A Little Help needed standing up from a chair using your arms (e.g., wheelchair or bedside chair)?: A Little Help needed to walk in hospital room?: A Little Help needed climbing 3-5 steps with a railing? : A Little 6 Click Score: 18    End of Session Equipment Utilized During Treatment: Other (comment)(No gait belt used secondary to multiple drains on pt's R side) Activity Tolerance: Patient tolerated treatment well Patient left: in chair;with call bell/phone within reach Nurse Communication: Mobility status;Other (comment)(Pt required chest tube to be placed back on suction and IV to be restarted) PT Visit Diagnosis: Muscle weakness (generalized) (M62.81);Difficulty in walking, not elsewhere classified (R26.2)    Time: 4967-5916 PT Time Calculation (min) (ACUTE ONLY): 35 min   Charges:   PT Evaluation $PT Eval Moderate Complexity: 1 Mod PT Treatments $Therapeutic Exercise: 8-22 mins        D. Scott Cydnee Fuquay PT, DPT 01/07/19, 2:15 PM

## 2019-01-08 ENCOUNTER — Inpatient Hospital Stay: Payer: PPO

## 2019-01-08 LAB — CBC
HCT: 37.4 % — ABNORMAL LOW (ref 39.0–52.0)
Hemoglobin: 12.5 g/dL — ABNORMAL LOW (ref 13.0–17.0)
MCH: 33.4 pg (ref 26.0–34.0)
MCHC: 33.4 g/dL (ref 30.0–36.0)
MCV: 100 fL (ref 80.0–100.0)
Platelets: 149 10*3/uL — ABNORMAL LOW (ref 150–400)
RBC: 3.74 MIL/uL — ABNORMAL LOW (ref 4.22–5.81)
RDW: 13.1 % (ref 11.5–15.5)
WBC: 11.5 10*3/uL — ABNORMAL HIGH (ref 4.0–10.5)
nRBC: 0 % (ref 0.0–0.2)

## 2019-01-08 LAB — BASIC METABOLIC PANEL
Anion gap: 12 (ref 5–15)
BUN: 23 mg/dL (ref 8–23)
CO2: 24 mmol/L (ref 22–32)
Calcium: 8.9 mg/dL (ref 8.9–10.3)
Chloride: 102 mmol/L (ref 98–111)
Creatinine, Ser: 0.98 mg/dL (ref 0.61–1.24)
GFR calc Af Amer: >60 mL/min (ref >60)
GFR calc non Af Amer: >60 mL/min (ref >60)
Glucose, Bld: 117 mg/dL — ABNORMAL HIGH (ref 70–99)
Potassium: 4.2 mmol/L (ref 3.5–5.1)
Sodium: 138 mmol/L (ref 135–145)

## 2019-01-08 NOTE — Progress Notes (Signed)
Physical Therapy Treatment Patient Details Name: [name] MRN: [id number] DOB: 07-May-1945 Today's Date: 01/08/2019    History of Present Illness Per MD notes: Pt is a 73 year old gentleman with a long smoking history who quit about a month ago.  PMH includes COPD, bipolar disorder, depression/anxiety, hiatal hernia, OCD, and peripheral neuropathy.  He saw Dr. Woody Seller for both upper and lower lobe nodules.  He was placed on antibiotics and a repeat CT scan was performed.  The CT scan which she had made just a few days ago showed continued enlargement of the peripheral right upper and peripheral right lower lobe nodules that were felt to be consistent with malignancy.  The patient does have a hiatal hernia and has had difficulty swallowing.  Pt now s/p muscle-sparing right thoracotomy with wedge resection of right upper and right lower lobe masses.    PT Comments    Pt presented with deficits in strength, transfers, mobility, gait, balance, and activity tolerance but made good progress towards goals.  Pt required no physical assistance with functional mobility and was steady with gait although he did require cues to amb closer to the RW with upright posture and to stay inside the RW during sharp turns.  Pt's SpO2 and HR were WNL during the session with pt experiencing min to mod SOB after amb 100' that quickly improved upon sitting.  Pt will benefit from HHPT services upon discharge to safely address above deficits for decreased caregiver assistance and eventual return to PLOF.     Follow Up Recommendations  Home health PT;Supervision - Intermittent     Equipment Recommendations  3in1 (PT);Other (comment)(Need to confirm if pt already has a BSC)    Recommendations for Other Services       Precautions / Restrictions Precautions Precautions: Fall Precaution Comments: R chest tube; OK to put to water seal for ambulation; R JP drain Restrictions Weight Bearing Restrictions:  No Other Position/Activity Restrictions: HOB to 45 deg; SpO2 >/= 90%    Mobility  Bed Mobility Overal bed mobility: Modified Independent             General bed mobility comments: Extra time and effort but no physical assistance required  Transfers Overall transfer level: Needs assistance Equipment used: Rolling walker (2 wheeled) Transfers: Sit to/from Stand Sit to Stand: Supervision         General transfer comment: Good control and stability with transfers  Ambulation/Gait Ambulation/Gait assistance: Supervision Gait Distance (Feet): 100 Feet x 1, 50 Feet x 1 Assistive device: Rolling walker (2 wheeled) Gait Pattern/deviations: Step-through pattern;Decreased step length - right;Decreased step length - left Gait velocity: decreased   General Gait Details: Short B step length and slow cadence but steady without LOB and with HR and SpO2 on room air WNL; mod verbal cues for amb closer to the [employer]    Modified Rankin (Stroke Patients Only)       Balance Overall balance assessment: Needs assistance   Sitting balance-Leahy Scale: Normal     Standing balance support: Bilateral upper extremity supported;During functional activity Standing balance-Leahy Scale: Good                              Cognition Arousal/Alertness: Awake/alert Behavior During Therapy: WFL for tasks assessed/performed Overall Cognitive Status: Within Functional Limits for tasks assessed  Exercises Total Joint Exercises Ankle Circles/Pumps: Strengthening;Both;10 reps Quad Sets: Strengthening;Both;10 reps Gluteal Sets: Strengthening;Both;10 reps Towel Squeeze: Strengthening;Both;10 reps Long Arc Quad: Strengthening;Both;10 reps Knee Flexion: Strengthening;Both;10 reps Marching in Standing: AROM;Both;10 reps;Standing    General Comments        Pertinent Vitals/Pain Pain  Assessment: No/denies pain    Home Living                      Prior Function            PT Goals (current goals can now be found in the care plan section) Progress towards PT goals: Progressing toward goals    Frequency    Min 2X/week      PT Plan Current plan remains appropriate    Co-evaluation              AM-PAC PT "6 Clicks" Mobility   Outcome Measure  Help needed turning from your back to your side while in a flat bed without using bedrails?: A Little Help needed moving from lying on your back to sitting on the side of a flat bed without using bedrails?: A Little Help needed moving to and from a bed to a chair (including a wheelchair)?: A Little Help needed standing up from a chair using your arms (e.g., wheelchair or bedside chair)?: A Little Help needed to walk in hospital room?: A Little Help needed climbing 3-5 steps with a railing? : A Little 6 Click Score: 18    End of Session Equipment Utilized During Treatment: Other (comment)(No gait belt used secondary to multiple drains on pt's R side) Activity Tolerance: Patient tolerated treatment well Patient left: in chair;with call bell/phone within reach;with SCD's reapplied;with chair alarm set Nurse Communication: Mobility status;Other (comment)(Pt required chest tube to be placed back on suction) PT Visit Diagnosis: Muscle weakness (generalized) (M62.81);Difficulty in walking, not elsewhere classified (R26.2)     Time: 4098-1191 PT Time Calculation (min) (ACUTE ONLY): 27 min  Charges:  $Gait Training: 8-22 mins $Therapeutic Exercise: 8-22 mins                     D. Scott Sheniah Supak PT, DPT 01/08/19, 12:16 PM

## 2019-01-08 NOTE — Progress Notes (Signed)
Not much pain, only when coughing.  Walked with patient in halls  No air leak seen.  60 cc from [place].  Serosanguinous  Wounds redressed - clean and dry Lungs equal bilaterally Cor reg  Will place chest tubes to water seal and repeat CXRay Check labs and pathology  [employer]

## 2019-01-09 ENCOUNTER — Other Ambulatory Visit: Payer: Self-pay | Admitting: Anatomic Pathology & Clinical Pathology

## 2019-01-09 ENCOUNTER — Inpatient Hospital Stay: Payer: PPO

## 2019-01-09 LAB — SURGICAL PATHOLOGY

## 2019-01-09 MED ORDER — TRAMADOL HCL 50 MG PO TABS: 50.0000 mg | ORAL_TABLET | Freq: Four times a day (QID) | ORAL | 0 refills | Status: DC

## 2019-01-09 MED ORDER — ALBUTEROL SULFATE (2.5 MG/3ML) 0.083% IN NEBU: 2.5000 mg | INHALATION_SOLUTION | RESPIRATORY_TRACT | Status: DC | PRN

## 2019-01-09 MED ORDER — IPRATROPIUM-ALBUTEROL 0.5-2.5 (3) MG/3ML IN SOLN
3.0000 mL | Freq: Three times a day (TID) | RESPIRATORY_TRACT | Status: DC
  Administered 2019-01-09: 3 mL via RESPIRATORY_TRACT
  Filled 2019-01-09: qty 3

## 2019-01-09 MED ORDER — HYDROCODONE-ACETAMINOPHEN 5-325 MG PO TABS: 1.0000 | ORAL_TABLET | ORAL | 0 refills | Status: DC | PRN

## 2019-01-09 NOTE — Progress Notes (Signed)
The posterior chest tube was removed this morning and the tube was subsequently clamped.  After approximately 1 hour and 45 minutes the repeat chest x-ray showed no evidence of pneumothorax and the other tube was removed.  All of his wounds are clean dry and intact.  I did discuss the results of the pathology with the patient and his sister.  Its not entirely clear yet what the final pathology will show although there was an incidental squamous cell carcinoma in one of the specimens.  We will await the final pathology but we will get our oncologist to see the patient as well.  I gave them all their discharge instructions.  She will record the drainage from the Jackson-Pratt and will contact our office on Monday morning.  I believe all their questions are answered and he is ready for discharge.

## 2019-01-09 NOTE — Care Management Important Message (Signed)
Important Message  Patient Details  Name: [name] MRN: [id number] Date of Birth: 1945-08-01   Medicare Important Message Given:  Yes     Dannette Barbara 01/09/2019, 11:19 AM

## 2019-01-09 NOTE — Progress Notes (Signed)
[name] to be D/C'd home with sister per MD order.  Discussed prescriptions and follow up appointments with the patient. Prescriptions given to patient, medication list explained in detail. Pt verbalized understanding.  Allergies as of 01/09/2019       Reactions   Ropinirole Hcl Other (See Comments)   Confusion, agiitation, disorientation, Hallucinations        Medication List     TAKE these medications    aspirin 81 MG tablet Take 1 tablet (81 mg total) by mouth daily.   cholecalciferol 25 MCG (1000 UT) tablet Commonly known as: VITAMIN D3 Take 1,000 Units by mouth daily.   CoQ10 100 MG Caps Take 100 mg by mouth daily.   divalproex 500 MG 24 hr tablet Commonly known as: DEPAKOTE ER Take 1,000 mg by mouth at bedtime.   FISH OIL PO Take 1 capsule by mouth daily.   HYDROcodone-acetaminophen 5-325 MG tablet Commonly known as: NORCO/VICODIN Take 1-2 tablets by mouth every 4 (four) hours as needed for moderate pain.   omeprazole 40 MG capsule Commonly known as: PRILOSEC Take 1 capsule (40 mg total) by mouth daily.   simvastatin 40 MG tablet Commonly known as: ZOCOR TAKE 1 TABLET (40 MG TOTAL) BY MOUTH DAILY AT 6 PM.   traMADol 50 MG tablet Commonly known as: ULTRAM Take 1-2 tablets (50-100 mg total) by mouth every 6 (six) hours.   vitamin B-12 1000 MCG tablet Commonly known as: CYANOCOBALAMIN Take 1,000 mcg by mouth daily.   ziprasidone 40 MG capsule Commonly known as: GEODON Take 40 mg by mouth daily with supper.               Discharge Care Instructions  (From admission, onward)           Start     Ordered   01/09/19 0000  Discharge wound care:    Comments: Wash over incisions starting 1.1.21 evening with soap and water and pat dry.  Cover with 4 x 4 and tape   01/09/19 1427            Vitals:   01/09/19 1503 01/09/19 1504  BP: (!) 150/88   Pulse: 84 72  Resp:  18  Temp: 98 F (36.7 C)   SpO2: 94% 92%    Skin clean,  dry and intact without evidence of skin break down, no evidence of skin tears noted. IV catheter discontinued intact. Site without signs and symptoms of complications. Dressing and pressure applied. Pt denies pain at this time. No complaints noted.  An After Visit Summary was printed and given to the patient. Patient escorted via [place], and D/C home via private auto.  [place] A [name]

## 2019-01-09 NOTE — Progress Notes (Signed)
  Eating better.  Pain is minimal.  Wounds clean and dry.    CXRay looked good this morning.  No air leak with vigorous cough.  Removed posterior chest tube.  Will clamp the anterior tube and repeat chest xray later today - id OK, remove tube and discharge to home.  JP draining serous fluid but 60 cc yesterday.  Will come home with this in place.  Pathology still pending.  [employer].

## 2019-01-09 NOTE — TOC Initial Note (Signed)
Transition of Care [facility]) - Initial/Assessment Note    Patient Details  Name: [name] MRN: [id number] Date of Birth: [birth year]  Transition of Care [facility]) CM/SW Contact:    Beverly Sessions, RN Phone Number: 01/09/2019, 3:55 PM  Clinical Narrative:                 Patient to discharge home today Sister at bedside, and will be transporting patient home  PT has assessed patient and recommends home health PT    RNCM reached out to MD, however he does not currently have Epic access to put in orders for home health.    RNCM confirmed with leadership that I am unable to take a verbal for the home health and face to face order.   RNCM reached out tot he home health agency to see if they are able to accept the referral and follow up with MD outpatient and they are not.   I have notified the patient sister to follow up with PCP if it is something they are interested in   Patient Goals and CMS Choice        Expected Discharge Plan and Services           Expected Discharge Date: 01/09/19                                    Prior Living Arrangements/Services                       Activities of Daily Living Home Assistive Devices/Equipment: Dentures (specify type), Hearing aid, Eyeglasses, Cane (specify quad or straight), Wheelchair ADL Screening (condition at time of admission) Patient's cognitive ability adequate to safely complete daily activities?: Yes Is the patient deaf or have difficulty hearing?: Yes Does the patient have difficulty seeing, even when wearing glasses/contacts?: No Does the patient have difficulty concentrating, remembering, or making decisions?: No Patient able to express need for assistance with ADLs?: Yes Does the patient have difficulty dressing or bathing?: No Independently performs ADLs?: Yes (appropriate for developmental age) Does the patient have difficulty walking or climbing stairs?: No Weakness of Legs: Both Weakness  of Arms/Hands: None  Permission Sought/Granted                  Emotional Assessment              Admission diagnosis:  Lung abscess [facility]) [J85.2] Patient Active Problem List   Diagnosis Date Noted  . Lung abscess ([place]) 01/06/2019  . Aortic atherosclerosis ([place]) 06/24/2018  . S/P lumbar fusion 02/18/2018  . Bipolar disorder ([place]) 08/06/2017  . S/P lumbar laminectomy 06/22/2017  . Coronary atherosclerosis 01/29/2017  . Lung nodule seen on imaging study 01/29/2017  . Maculopathy 12/28/2016  . Patent foramen ovale   . Abscess of upper lobe of left lung without pneumonia ([place]) 09/14/2015  . Smoking greater than 30 pack years 07/19/2015  . Subclinical hypothyroidism 07/17/2014  . Anxiety 07/08/2014  . Dyspnea on exertion 07/08/2014  . Memory deficit 07/31/2012  . Cerebrovascular disease, unspecified 03/01/2012  . Restless legs syndrome (RLS) 03/01/2012  . Unspecified hereditary and idiopathic peripheral neuropathy 03/01/2012  . COPD (chronic obstructive pulmonary disease) ([place]) 06/14/2010  . BPH (benign prostatic hyperplasia) 06/08/2009  . Late effects of cerebrovascular disease 07/19/2008  . Bipolar I disorder, single manic episode, moderate ([place]) 07/14/2008  . Fam hx-ischem heart disease 07/16/2006  .  Hyperlipidemia 01/10/2003  . Herpesviral infection of perianal skin and rectum 01/10/2000   PCP:  Birdie Sons, MD Pharmacy:   [id number] [place], [place] [address] Phone: [phone] Fax: [phone]     Social Determinants of Health (SDOH) Interventions    Readmission Risk Interventions No flowsheet data found.

## 2019-01-09 NOTE — Discharge Summary (Signed)
Physician Discharge Summary  Patient ID: [name] MRN: [id number] DOB/AGE: May 30, 1945 73 y.o.  Admit date: 01/06/2019 Discharge date: 01/09/2019   Discharge Diagnoses:  Active Problems:   Lung abscess ([place])   Procedures: Preoperative bronchoscopy with right thoracotomy and right upper lobe and right lower lobe wedge resections  Hospital Course: Patient underwent the above-named procedure and was nursed overnight in the intensive care unit and then the subsequent 2 days on the floor.  He had a very small air leak after extubation which resolved by postoperative day #1.  By postoperative day #3 it had no air leak and with the tubes clamped the chest x-ray showed no evidence of pneumothorax and the chest tubes were removed.  His Jackson-Pratt drain continued to drain about 50 to 60 cc/day and it was left in place at the time of discharge.  The family was instructed on how to manage his wounds and also on how to manage his Jackson-Pratt drain.  They will contact our office on Monday morning to review the drainage from the catheter.  In addition I did perform frozen sections in the operating room which revealed only an abscess but there was an incidental squamous cell carcinoma identified in the right upper lobe wedge resection.  We are awaiting the final and complete pathology.  Disposition: Discharge disposition: 01-Home or Self Care       Discharge Instructions    Diet - low sodium heart healthy   Complete by: As directed    Discharge wound care:   Complete by: As directed    Wash over incisions starting 1.1.21 evening with soap and water and pat dry.  Cover with 4 x 4 and tape   Increase activity slowly   Complete by: As directed      Allergies as of 01/09/2019      Reactions   Ropinirole Hcl Other (See Comments)   Confusion, agiitation, disorientation, Hallucinations      Medication List    TAKE these medications   aspirin 81 MG tablet Take 1 tablet (81 mg total)  by mouth daily.   cholecalciferol 25 MCG (1000 UT) tablet Commonly known as: VITAMIN D3 Take 1,000 Units by mouth daily.   CoQ10 100 MG Caps Take 100 mg by mouth daily.   divalproex 500 MG 24 hr tablet Commonly known as: DEPAKOTE ER Take 1,000 mg by mouth at bedtime.   FISH OIL PO Take 1 capsule by mouth daily.   HYDROcodone-acetaminophen 5-325 MG tablet Commonly known as: NORCO/VICODIN Take 1-2 tablets by mouth every 4 (four) hours as needed for moderate pain.   omeprazole 40 MG capsule Commonly known as: PRILOSEC Take 1 capsule (40 mg total) by mouth daily.   simvastatin 40 MG tablet Commonly known as: ZOCOR TAKE 1 TABLET (40 MG TOTAL) BY MOUTH DAILY AT 6 PM.   traMADol 50 MG tablet Commonly known as: ULTRAM Take 1-2 tablets (50-100 mg total) by mouth every 6 (six) hours.   vitamin B-12 1000 MCG tablet Commonly known as: CYANOCOBALAMIN Take 1,000 mcg by mouth daily.   ziprasidone 40 MG capsule Commonly known as: GEODON Take 40 mg by mouth daily with supper.            Discharge Care Instructions  (From admission, onward)         Start     Ordered   01/09/19 0000  Discharge wound care:    Comments: Wash over incisions starting 1.1.21 evening with soap and water and pat dry.  Cover with 4 x 4 and tape   01/09/19 1427           [name], MD

## 2019-01-13 ENCOUNTER — Ambulatory Visit (INDEPENDENT_AMBULATORY_CARE_PROVIDER_SITE_OTHER): Payer: Self-pay | Admitting: Physician Assistant

## 2019-01-13 ENCOUNTER — Encounter: Payer: Self-pay | Admitting: Physician Assistant

## 2019-01-13 ENCOUNTER — Other Ambulatory Visit: Payer: Self-pay

## 2019-01-13 VITALS — BP 127/85 | HR 89 | Temp 97.5°F | Ht 70.0 in | Wt 155.0 lb

## 2019-01-13 DIAGNOSIS — C3490 Malignant neoplasm of unspecified part of unspecified bronchus or lung: Secondary | ICD-10-CM

## 2019-01-13 DIAGNOSIS — Z09 Encounter for follow-up examination after completed treatment for conditions other than malignant neoplasm: Secondary | ICD-10-CM

## 2019-01-13 NOTE — Progress Notes (Signed)
[facility] POST-OP OFFICE VISIT  01/13/2019  HPI: [name] is a 74 y.o. male 7 days s/p muscle-sparing right thoracotomy with wedge resection of right upper and right lower lobe masses with Dr Genevive Bi.   He did well and was discharged on 12/31. He did go home with JP drain in right chest wall. Since discharge, he has been doing well. No issues with breathing. His drain has been putting out around 50 ccs a day of serosanguinous fluid. No other complaints.   Vital signs: There were no vitals taken for this visit.   Physical Exam: Constitutional: Well appearing male, NAD Respiratory: Lungs slightly diminished, no respiratory distress Chest: JP in the right lateral chest, serosanguinous, site is CDI  Skin: Thoracotomy incision is CDI with staples, no erythema or drainage  Assessment/Plan: This is a 74 y.o. male 7 days s/p muscle-sparing right thoracotomy with wedge resection of right upper and right lower lobe masses   - Continue drain; monitor and record output  - staples removed; steri-strips placed  - reviewed pathology  - follow up with oncology to discuss pathology results  - recommend follow up with Dr Dahlia Byes in the future for larger hiatal hernia  - rtc on Friday with Dr Genevive Bi  Patient seen with Dr Genevive Bi and plan as outline above discussed.   -- Edison Simon, PA-C [facility] 01/13/2019, 1:48 PM [phone] M-F: 7am - 4pm

## 2019-01-13 NOTE — Patient Instructions (Addendum)
Patient will keep the drain in until the drain output is 30cc or less for the entire day.  Patient will contact our office Thursday if the drain output is not close to 30cc.   Patient is to continue to change the dressing daily and continue to clean the would with soap and water.  Patient had staples removed at today's visit with Dr.Oaks and Otho Ket, PA-C.  Dr.Oaks recommend patient to call and scheduled an appointment with Dr.Pabon, MD to discuss Hiatal Hernia repair.

## 2019-01-14 DIAGNOSIS — I2510 Atherosclerotic heart disease of native coronary artery without angina pectoris: Secondary | ICD-10-CM | POA: Diagnosis not present

## 2019-01-14 DIAGNOSIS — F419 Anxiety disorder, unspecified: Secondary | ICD-10-CM | POA: Diagnosis not present

## 2019-01-14 DIAGNOSIS — F3012 Manic episode without psychotic symptoms, moderate: Secondary | ICD-10-CM | POA: Diagnosis not present

## 2019-01-14 DIAGNOSIS — J852 Abscess of lung without pneumonia: Secondary | ICD-10-CM | POA: Diagnosis not present

## 2019-01-14 DIAGNOSIS — Z48813 Encounter for surgical aftercare following surgery on the respiratory system: Secondary | ICD-10-CM | POA: Diagnosis not present

## 2019-01-14 DIAGNOSIS — Z7982 Long term (current) use of aspirin: Secondary | ICD-10-CM | POA: Diagnosis not present

## 2019-01-14 DIAGNOSIS — M6281 Muscle weakness (generalized): Secondary | ICD-10-CM | POA: Diagnosis not present

## 2019-01-14 DIAGNOSIS — J449 Chronic obstructive pulmonary disease, unspecified: Secondary | ICD-10-CM | POA: Diagnosis not present

## 2019-01-14 DIAGNOSIS — G8912 Acute post-thoracotomy pain: Secondary | ICD-10-CM | POA: Diagnosis not present

## 2019-01-14 DIAGNOSIS — Z87891 Personal history of nicotine dependence: Secondary | ICD-10-CM | POA: Diagnosis not present

## 2019-01-16 ENCOUNTER — Other Ambulatory Visit: Payer: Self-pay

## 2019-01-16 ENCOUNTER — Inpatient Hospital Stay: Payer: PPO | Attending: Internal Medicine | Admitting: Internal Medicine

## 2019-01-16 ENCOUNTER — Other Ambulatory Visit: Payer: PPO

## 2019-01-16 DIAGNOSIS — Z801 Family history of malignant neoplasm of trachea, bronchus and lung: Secondary | ICD-10-CM | POA: Diagnosis not present

## 2019-01-16 DIAGNOSIS — F429 Obsessive-compulsive disorder, unspecified: Secondary | ICD-10-CM | POA: Insufficient documentation

## 2019-01-16 DIAGNOSIS — J449 Chronic obstructive pulmonary disease, unspecified: Secondary | ICD-10-CM | POA: Insufficient documentation

## 2019-01-16 DIAGNOSIS — Z833 Family history of diabetes mellitus: Secondary | ICD-10-CM | POA: Insufficient documentation

## 2019-01-16 DIAGNOSIS — K219 Gastro-esophageal reflux disease without esophagitis: Secondary | ICD-10-CM | POA: Diagnosis not present

## 2019-01-16 DIAGNOSIS — Z8249 Family history of ischemic heart disease and other diseases of the circulatory system: Secondary | ICD-10-CM | POA: Insufficient documentation

## 2019-01-16 DIAGNOSIS — C3411 Malignant neoplasm of upper lobe, right bronchus or lung: Secondary | ICD-10-CM | POA: Insufficient documentation

## 2019-01-16 DIAGNOSIS — R634 Abnormal weight loss: Secondary | ICD-10-CM | POA: Diagnosis not present

## 2019-01-16 DIAGNOSIS — R05 Cough: Secondary | ICD-10-CM | POA: Insufficient documentation

## 2019-01-16 DIAGNOSIS — E785 Hyperlipidemia, unspecified: Secondary | ICD-10-CM

## 2019-01-16 DIAGNOSIS — Z87891 Personal history of nicotine dependence: Secondary | ICD-10-CM | POA: Insufficient documentation

## 2019-01-16 DIAGNOSIS — Z7982 Long term (current) use of aspirin: Secondary | ICD-10-CM | POA: Insufficient documentation

## 2019-01-16 DIAGNOSIS — Z79899 Other long term (current) drug therapy: Secondary | ICD-10-CM | POA: Diagnosis not present

## 2019-01-16 DIAGNOSIS — Z85118 Personal history of other malignant neoplasm of bronchus and lung: Secondary | ICD-10-CM | POA: Insufficient documentation

## 2019-01-16 NOTE — Progress Notes (Signed)
Tumor Board Documentation  [name] was presented by Dr Rogue Bussing at our Tumor Board on 01/16/2019, which included representatives from medical oncology, radiation oncology, navigation, pathology, radiology, surgical, surgical oncology, internal medicine, pharmacy, genetics, palliative care, research.  [name] currently presents as a new patient, for [name], for new positive pathology with history of the following treatments: active survellience, surgical intervention(s).  Additionally, we reviewed previous medical and familial history, history of present illness, and recent lab results along with all available histopathologic and imaging studies. The tumor board considered available treatment options and made the following recommendations: Active surveillance    The following procedures/referrals were also placed: No orders of the defined types were placed in this encounter.   Clinical Trial Status: not discussed   Staging used: AJCC Stage Group  AJCC Staging: T: 1a N: x   Group: Stage 1 Squamous Cell Lung Cancer   National site-specific guidelines NCCN were discussed with respect to the case.  Tumor board is a meeting of clinicians from various specialty areas who evaluate and discuss patients for whom a multidisciplinary approach is being considered. Final determinations in the plan of care are those of the provider(s). The responsibility for follow up of recommendations given during tumor board is that of the provider.   Today's extended care, comprehensive team conference, [name] was not present for the discussion and was not examined.   Multidisciplinary Tumor Board is a multidisciplinary case peer review process.  Decisions discussed in the Multidisciplinary Tumor Board reflect the opinions of the specialists present at the conference without having examined the patient.  Ultimately, treatment and diagnostic decisions rest with the primary provider(s) and the patient.

## 2019-01-16 NOTE — Assessment & Plan Note (Addendum)
#  Squamous cell cancer right upper lobe stage Ia [incidental]-discussed the pathology/ the overall good prognosis of stage I lung cancer.  No role for any adjuvant chemotherapy or radiation.   #However discussed that given history of lung cancer/smoking-new primary is always a risk.  Would recommend continued surveillance/lung cancer screening program.  #Smoker-quit smoking approximately 8 weeks ago.  Congratulated patient on continued abstinence.  #COPD-overall stable; continue follow-up with pulmonary.  # I discussed regarding Covid precautions/and also discussed proceeding with Covid vaccination when available.  Discussed that unfortunately the data safety and efficacy of vaccination is unclear especially in patients with immunocompromised state.  However, I think the benefits of the vaccination outweigh the potential risks.  Thank you Dr.Oaks for allowing me to participate in the care of your pleasant patient. Please do not hesitate to contact me with questions or concerns in the interim. Also discussed the tumor conference on 1/08.   # DISPOSITION:  # follow up in 12 months- MD; labs- cbc/cmp- Dr.B  Cc; Burgess Estelle

## 2019-01-16 NOTE — Progress Notes (Signed)
[name] [place] CONSULT NOTE  Patient Care Team: Birdie Sons, MD as PCP - General (Family Medicine) Chauncey Mann, MD as Referring Physician (Psychiatry)  CHIEF COMPLAINTS/PURPOSE OF CONSULTATION:  Lung cancer  #  Oncology History Overview Note  # RUL LUNG CANCER- SQUAMOUS CELL CA [pTIa;-3.5 mm; squamous cell; s/p wedge resection Dr.Oaks; incidental]- negative margins  # lung RUL wedge resection- [s/p tube]  # > 2010- left lung resection [? Etiology/benign]  DIAGNOSIS: RUL LUNG CA  STAGE:   I      ;  GOALS: cure  CURRENT/MOST RECENT THERAPY : surveillaince       Cancer of upper lobe of right lung ([place])  01/16/2019 Initial Diagnosis   Cancer of upper lobe of right lung (HCC)      HISTORY OF PRESENTING ILLNESS:  [name] 74 y.o.  male longstanding history of smoking noted to have incidental lung cancer is here for initial consultation.  Patient states that he was noted to have abnormal lung lesions on work-up of shortness of breath/cough.  Some of the lesions tend to improve on antibiotics.  However given the lack of complete resolution of the lung nodules he went on to have thoracotomy-wedge resection of the right upper and lower lobe.   Patient currently has a drain in place.  He continues to complain of cough and shortness of breath.  However overall is improving.  Patient has quit smoking.   Review of Systems  Constitutional: Positive for weight loss. Negative for chills, diaphoresis, fever and malaise/fatigue.  HENT: Negative for nosebleeds and sore throat.   Eyes: Negative for double vision.  Respiratory: Positive for cough, sputum production and shortness of breath. Negative for hemoptysis and wheezing.   Cardiovascular: Negative for chest pain, palpitations, orthopnea and leg swelling.  Gastrointestinal: Negative for abdominal pain, blood in stool, constipation, diarrhea, heartburn, melena, nausea and vomiting.  Genitourinary:  Negative for dysuria, frequency and urgency.  Musculoskeletal: Negative for back pain and joint pain.  Skin: Negative.  Negative for itching and rash.  Neurological: Negative for dizziness, tingling, focal weakness, weakness and headaches.  Endo/Heme/Allergies: Does not bruise/bleed easily.  Psychiatric/Behavioral: Negative for depression. The patient is not nervous/anxious and does not have insomnia.      MEDICAL HISTORY:  Past Medical History:  Diagnosis Date  . Arthritis    in his fingers  . Bipolar disorder ([place])   . BPH (benign prostatic hyperplasia)   . Depression   . Emphysema   . Episodic confusion    Possible bipolar disorder  . GERD (gastroesophageal reflux disease)   . History of chicken pox   . History of hiatal hernia   . HNP (herniated nucleus pulposus), lumbar   . Hyperlipidemia   . Memory deficit 07/31/2012  . OCD (obsessive compulsive disorder)   . Peripheral neuropathy   . Restless leg syndrome 01/10/2003  . Unspecified psychosis 03/01/2012    SURGICAL HISTORY: Past Surgical History:  Procedure Laterality Date  . BACK SURGERY    . COLONOSCOPY    . ESOPHAGOGASTRODUODENOSCOPY    . FLEXIBLE BRONCHOSCOPY [name] 01/06/2019   Procedure: FLEXIBLE BRONCHOSCOPY;  Surgeon: [name], MD;  Location: [facility];  Service: Thoracic;  Laterality: [name];  . liver excision     for non-cancerous mass  . LUMBAR LAMINECTOMY/DECOMPRESSION MICRODISCECTOMY Left 06/22/2017   Procedure: Extraforaminal Microdiscectomy  - L4-L5 - left;  Surgeon: Eustace Moore, MD;  Location: [place];  Service: Neurosurgery;  Laterality: Left;  .  LUNG SURGERY  2005   Lipoma Removal  . MAXIMUM ACCESS (MAS) TRANSFORAMINAL LUMBAR INTERBODY FUSION (TLIF) 1 LEVEL [name] 02/18/2018   Procedure: Transforaminal Lumbar Interbody Fusion - Lumbar Four - Lumbar Five;  Surgeon: Eustace Moore, MD;  Location: [place];  Service: Neurosurgery;  Laterality: [name];  Transforaminal Lumbar Interbody Fusion - Lumbar Four - Lumbar  Five  . THORACOTOMY Right 01/06/2019   Procedure: THORACOTOMY MAJOR;  Surgeon: [name], MD;  Location: [facility];  Service: Thoracic;  Laterality: Right;  [name] VIDEO ASSISTED THORACOSCOPY (VATS)/WEDGE RESECTION Right 01/06/2019   Procedure: VIDEO ASSISTED THORACOSCOPY (VATS)/WEDGE RESECTION-RUL and RLL-lung resection;  Surgeon: [name], MD;  Location: [facility];  Service: Thoracic;  Laterality: Right;    SOCIAL HISTORY: Social History   Socioeconomic History  . Marital status: Divorced    Spouse name: Not on file  . Number of children: 0  . Years of education: 72  . Highest education level: Not on file  Occupational History  . Occupation: Retired  Tobacco Use  . Smoking status: Former Smoker    Packs/day: 0.75    Years: 50.00    Pack years: 37.50    Types: Cigarettes    Quit date: 11/26/2018    Years since quitting: 0.1  . Smokeless tobacco: Never Used  Substance and Sexual Activity  . Alcohol use: No  . Drug use: No  . Sexual activity: Not on file  Other Topics Concern  . Not on file  Social History Narrative   Patient lives at home with wife.    Patient is retired.    Patient has no children.    Patient has 11th grade education.    Social Determinants of Health   Financial Resource Strain:   . Difficulty of Paying Living Expenses: Not on file  Food Insecurity:   . Worried About Charity fundraiser in the Last Year: Not on file  . Ran Out of Food in the Last Year: Not on file  Transportation Needs:   . Lack of Transportation (Medical): Not on file  . Lack of Transportation (Non-Medical): Not on file  Physical Activity:   . Days of Exercise per Week: Not on file  . Minutes of Exercise per Session: Not on file  Stress:   . Feeling of Stress : Not on file  Social Connections:   . Frequency of Communication with Friends and Family: Not on file  . Frequency of Social Gatherings with Friends and Family: Not on file  . Attends Religious Services: Not on file   . Active Member of Clubs or Organizations: Not on file  . Attends Archivist Meetings: Not on file  . Marital Status: Not on file  Intimate Partner Violence:   . Fear of Current or Ex-Partner: Not on file  . Emotionally Abused: Not on file  . Physically Abused: Not on file  . Sexually Abused: Not on file    FAMILY HISTORY: Family History  Problem Relation Age of Onset  . Heart disease Mother   . Cancer Father        lung  . Heart disease Father   . Diabetes Sister   . Heart disease Sister   . Seizures Paternal Grandfather     ALLERGIES:  is allergic to ropinirole hcl and tramadol.  MEDICATIONS:  Current Outpatient Medications  Medication Sig Dispense Refill  . aspirin 81 MG tablet Take 1 tablet (81 mg total) by mouth daily.    . cholecalciferol (VITAMIN  D3) 25 MCG (1000 UT) tablet Take 1,000 Units by mouth daily.    . Coenzyme Q10 (COQ10) 100 MG CAPS Take 100 mg by mouth daily.     . divalproex (DEPAKOTE ER) 500 MG 24 hr tablet Take 1,000 mg by mouth at bedtime.     . Omega-3 Fatty Acids (FISH OIL PO) Take 1 capsule by mouth daily.    [name] omeprazole (PRILOSEC) 40 MG capsule Take 1 capsule (40 mg total) by mouth daily. 90 capsule 2  . simvastatin (ZOCOR) 40 MG tablet TAKE 1 TABLET (40 MG TOTAL) BY MOUTH DAILY AT 6 PM. 90 tablet 4  . vitamin B-12 (CYANOCOBALAMIN) 1000 MCG tablet Take 1,000 mcg by mouth daily.    . ziprasidone (GEODON) 40 MG capsule Take 40 mg by mouth daily with supper.     [name] HYDROcodone-acetaminophen (NORCO/VICODIN) 5-325 MG tablet Take 1-2 tablets by mouth every 4 (four) hours as needed for moderate pain. (Patient not taking: Reported on 01/16/2019) 20 tablet 0   No current facility-administered medications for this visit.      [name]  PHYSICAL EXAMINATION:  Vitals:   01/16/19 1131  BP: 108/66  Pulse: 88  Temp: 97.8 F (36.6 C)   Filed Weights   01/16/19 1131  Weight: 168 lb (76.2 kg)    Physical Exam  Constitutional: He is oriented to  person, place, and time.  Mechele Claude elderly male patient.  Accompanied by his sister.  Is walking himself.  HENT:  Head: Normocephalic and atraumatic.  Mouth/Throat: Oropharynx is clear and moist. No oropharyngeal exudate.  Eyes: Pupils are equal, round, and reactive to light.  Cardiovascular: Normal rate and regular rhythm.  Pulmonary/Chest: No respiratory distress. He has no wheezes.  Decreased air entry bilaterally.  Drain present on the right side.  Abdominal: Soft. Bowel sounds are normal. He exhibits no distension and no mass. There is no abdominal tenderness. There is no rebound and no guarding.  Musculoskeletal:        General: No tenderness or edema. Normal range of motion.     Cervical back: Normal range of motion and neck supple.  Neurological: He is alert and oriented to person, place, and time.  Skin: Skin is warm.  Psychiatric: Affect normal.     LABORATORY DATA:  I have reviewed the data as listed Lab Results  Component Value Date   WBC 11.5 (H) 01/08/2019   HGB 12.5 (L) 01/08/2019   HCT 37.4 (L) 01/08/2019   MCV 100.0 01/08/2019   PLT 149 (L) 01/08/2019   Recent Labs    11/13/18 1035 01/01/19 1507 01/08/19 1258  NA 145* 142 138  K 4.7 4.3 4.2  CL 106 106 102  CO2 _0 GLUCOSE 83 133* 117*  BUN _1 CREATININE 1.06 1.02 0.98  CALCIUM 9.5 8.9 8.9  GFRNONAA 69 >60 >60  GFRAA 80 >60 >60  PROT 6.5 6.8  --   ALBUMIN 4.3 3.9  --   AST 23 26  --   ALT 14 21  --   ALKPHOS 81 66  --   BILITOT 0.3 0.5  --     RADIOGRAPHIC STUDIES: I have personally reviewed the radiological images as listed and agreed with the findings in the report. DG Chest 2 View  Result Date: 01/09/2019 CLINICAL DATA:  Postop check. Status post right upper lobe and right lower lobe wedge resections. EXAM: CHEST - 2 VIEW COMPARISON:  01/08/2019 FINDINGS: There are 2 chest tubes identified  on the right. No significant pneumothorax identified. Heart size normal. Large  hiatal hernia. Left lung clear. IMPRESSION: 1. Right-sided chest tubes in place without pneumothorax. 2. Large hiatal hernia. Electronically Signed   By: Kerby Moors M.D.   On: 01/09/2019 09:20   DG Chest 2 View  Result Date: 01/08/2019 CLINICAL DATA:  Postop, chest tube insertion EXAM: CHEST - 2 VIEW COMPARISON:  01/06/2019 FINDINGS: Right apical and basal chest tubes are present. No pneumothorax. Similar patchy opacities which may correspond to lesions on chest CT and/or postoperative changes on the right. Stable cardiomediastinal contours. Hiatal hernia is again noted. IMPRESSION: No substantial change. Chest tubes remain present with no pneumothorax identified. Electronically Signed   By: Macy Mis M.D.   On: 01/08/2019 15:43   Chest 2 View  Result Date: 01/02/2019 CLINICAL DATA:  Preoperative assessment. Patient scheduled for thoracotomy. COPD. EXAM: CHEST - 2 VIEW COMPARISON:  February 07, 2018 chest radiograph; chest CT December 25, 2018 FINDINGS: There is postoperative change in the left upper lobe. The irregular nodular opacity seen in the left upper lobe on recent CT is less well delineated as a discrete lesion by radiography but is apparent. This nodular area measures 1.8 x 1.4 cm by radiography. There are scattered areas of scarring bilaterally with areas of relative lucency bilaterally consistent with underlying emphysema. Note that emphysematous better demonstrated on CT. There are nipple shadows bilaterally. There is no edema or consolidation. The heart size is normal. Pulmonary vascularity is stable with diminished vascularity in the upper lobe regions, presumably due to underlying emphysematous change. Surgical clips are again noted in the left hilar region. There is a focal hiatal hernia. No adenopathy evident. No bone lesions. IMPRESSION: Nodular area in the left upper lobe is present but better seen on recent CT. There is postoperative scarring in this area. Underlying  emphysematous change evident. Scattered areas of scarring noted. No edema or consolidation. Heart size normal. Focal hiatal hernia evident. Electronically Signed   By: Lowella Grip III M.D.   On: 01/02/2019 08:32   CT CHEST WO CONTRAST  Result Date: 12/25/2018 CLINICAL DATA:  Lung nodule, abnormal chest radiograph. EXAM: CT CHEST WITHOUT CONTRAST TECHNIQUE: Multidetector CT imaging of the chest was performed following the standard protocol without IV contrast. COMPARISON:  PET 10/10/2018, CT chest 09/20/2018, 06/14/2018, 05/02/2017 and 12/15/2015. FINDINGS: Cardiovascular: Atherosclerotic calcification of the aorta, aortic valve and coronary arteries. Heart size normal. No pericardial effusion. Mediastinum/Nodes: No pathologically enlarged mediastinal or axillary lymph nodes. Hilar regions are difficult to evaluate without IV contrast. Esophagus is grossly unremarkable. Moderate hiatal hernia. Lungs/Pleura: Right apical scarring. Centrilobular and paraseptal emphysema. A thick-walled cavitary nodule in the posterior segment right upper lobe measures 8 mm (6 x 9 mm, 3/51), previously 6 mm on 09/20/2018. Cavitary nodule in the posterior right lower lobe has also enlarged, now measuring 7 x 15 mm (3/83), compared to 7 mm on 09/20/2018. Spiculated soft tissue associated with surgical clips in the left upper lobe measures 1.7 x 1.8 cm (3/47), similar. Prior biopsy revealed an abscess. Surrounding architectural distortion. Pleural calcification in the left hemithorax. No pleural fluid. Airway is unremarkable. Upper Abdomen: Visualized portions of the liver and gallbladder are unremarkable. Calcification is seen in the lateral limb right adrenal gland. Slight nodular thickening of the left adrenal gland. Visualized portions of the kidneys, spleen, pancreas, stomach and bowel are unremarkable with exception of a moderate hiatal hernia. Musculoskeletal: No worrisome lytic or sclerotic lesions. IMPRESSION: 1.  Enlarging cavitary  nodules in the right upper and right lower lobes, highly worrisome for synchronous bronchogenic carcinomas. 2. Spiculated soft tissue in the left upper lobe is stable and was shown to represent an abscess on prior biopsy. 3. Aortic atherosclerosis (ICD10-170.0). Coronary artery calcification. 4.  Emphysema (ICD10-J43.9). Electronically Signed   By: Lorin Picket M.D.   On: 12/25/2018 14:09   DG SWALLOW FUNC OP MEDICARE SPEECH PATH  Result Date: 01/16/2019 Please refer to "Notes" tab for Speech Pathology notes. CLINICAL DATA:  Dysphagia. EXAM: MODIFIED BARIUM SWALLOW TECHNIQUE: Different consistencies of barium were administered orally to the patient by the Speech Pathologist. Imaging of the pharynx was performed in the lateral projection. The radiologist was present in the fluoroscopy room for this study, providing personal supervision. FLUOROSCOPY TIME:  Fluoroscopy Time:  1 minutes 12 seconds Number of Acquired Spot Images: 0 COMPARISON:  None. FINDINGS: The patient was given multiple consistencies of barium. Please see the speech pathologist's report for details. IMPRESSION: Imaging was provided for the speech pathologist fall she gave the patient multiple consistencies of barium. Please see her report for details. Please refer to the Speech Pathologists report for complete details and recommendations. Electronically Signed   By: Dorise Bullion III M.D   On: 12/20/2018 13:18   DG Chest Port 1 View  Result Date: 01/09/2019 CLINICAL DATA:  Postop EXAM: PORTABLE CHEST 1 VIEW COMPARISON:  01/09/2019 FINDINGS: Interval removal of 1 of the 2 right chest tubes. No pneumothorax. Large hiatal hernia. No confluent opacities. Heart is normal size. No acute bony abnormality. IMPRESSION: Interval removal of 1 of the 2 right chest tubes without pneumothorax. Electronically Signed   By: Rolm Baptise M.D.   On: 01/09/2019 11:28   DG Chest Port 1 View  Result Date: 01/06/2019 CLINICAL DATA:   Postop check. EXAM: PORTABLE CHEST 1 VIEW COMPARISON:  January 01, 2019 FINDINGS: There are 2 right-sided chest tubes in place. There is no convincing right-sided pneumothorax. There is some subcutaneous gas along the right flank. The heart size is borderline enlarged. There is scattered pulmonary opacities bilaterally which may represent the patient's known cavitary lesions. There appears to be some scarring versus atelectasis at the lung bases bilaterally, left worse than right. A hiatal hernia is again noted. IMPRESSION: 1. Right-sided chest tubes in place without evidence for significant right-sided pneumothorax. 2. Scattered opacities bilaterally favored to represent the patient's known cavitary lesions. 3. Probable postoperative atelectasis at the lung bases. Electronically Signed   By: Constance Holster M.D.   On: 01/06/2019 17:24    ASSESSMENT & PLAN:   Cancer of upper lobe of right lung (HCC) #Squamous cell cancer right upper lobe stage Ia [incidental]-discussed the pathology/ the overall good prognosis of stage I lung cancer.  No role for any adjuvant chemotherapy or radiation.   #However discussed that given history of lung cancer/smoking-new primary is always a risk.  Would recommend continued surveillance/lung cancer screening program.  #Smoker-quit smoking approximately 8 weeks ago.  Congratulated patient on continued abstinence.  #COPD-overall stable; continue follow-up with pulmonary.  # I discussed regarding Covid precautions/and also discussed proceeding with Covid vaccination when available.  Discussed that unfortunately the data safety and efficacy of vaccination is unclear especially in patients with immunocompromised state.  However, I think the benefits of the vaccination outweigh the potential risks.  Thank you Dr.Oaks for allowing me to participate in the care of your pleasant patient. Please do not hesitate to contact me with questions or concerns in the interim. Also  discussed the tumor conference on 1/08.   # DISPOSITION:  # follow up in 12 months- MD; labs- cbc/cmp- Dr.B  Cc; Burgess Estelle   All questions were answered. The patient knows to call the clinic with any problems, questions or concerns.    Cammie Sickle, MD 01/17/2019 10:33 AM

## 2019-01-17 ENCOUNTER — Encounter: Payer: PPO | Admitting: Cardiothoracic Surgery

## 2019-01-21 ENCOUNTER — Other Ambulatory Visit: Payer: Self-pay

## 2019-01-21 ENCOUNTER — Ambulatory Visit (INDEPENDENT_AMBULATORY_CARE_PROVIDER_SITE_OTHER): Payer: Self-pay | Admitting: Physician Assistant

## 2019-01-21 ENCOUNTER — Encounter: Payer: Self-pay | Admitting: Physician Assistant

## 2019-01-21 VITALS — BP 116/71 | HR 76 | Temp 97.7°F | Resp 14 | Ht 70.0 in | Wt 160.0 lb

## 2019-01-21 DIAGNOSIS — Z09 Encounter for follow-up examination after completed treatment for conditions other than malignant neoplasm: Secondary | ICD-10-CM

## 2019-01-21 NOTE — Patient Instructions (Addendum)
We removed the drain today. You will need to keep a dry gauze over the drain site until it heals completely. Please see your appointment listed below.

## 2019-01-21 NOTE — Progress Notes (Signed)
[facility] POST-OP OFFICE VISIT  01/21/2019  HPI: [name] is a 74 y.o. male 15 days s/p muscle-sparing right thoracotomy with wedge resection of right upper and right lower lobe masses with Dr Genevive Bi.   He is here today for drain removal. Output has slowed to less than 25 ccs a day and has remained serous. No other new complaints.   Vital signs: BP 116/71   Pulse 76   Temp 97.7 F (36.5 C) (Temporal)   Resp 14   Ht 5\' 10"  (1.778 m)   Wt 160 lb (72.6 kg)   SpO2 97%   BMI 22.96 kg/m    Physical Exam: Constitutional: Well appearing male, NAD Chest: Right thoracotomy incision is healing well with steri-strips still in place, Jp in right chest wall with serous output, chest tube sites healing well.   Assessment/Plan: This is a 74 y.o. male 15 days s/p muscle-sparing right thoracotomy with wedge resection of right upper and right lower lobe masses    - Pain control prn  - Surgical drain removed  - Remaining sutures removed  - follow up with Dr Genevive Bi in 1-2 weeks for final re-check  -- Edison Simon, PA-C [facility] 01/21/2019, 10:47 AM [id number] M-F: 7am - 4pm

## 2019-01-29 ENCOUNTER — Other Ambulatory Visit: Payer: Self-pay

## 2019-01-29 DIAGNOSIS — Z09 Encounter for follow-up examination after completed treatment for conditions other than malignant neoplasm: Secondary | ICD-10-CM

## 2019-01-29 NOTE — Progress Notes (Signed)
Chest xray placed.

## 2019-01-31 ENCOUNTER — Encounter: Payer: Self-pay | Admitting: Cardiothoracic Surgery

## 2019-01-31 ENCOUNTER — Ambulatory Visit (INDEPENDENT_AMBULATORY_CARE_PROVIDER_SITE_OTHER): Payer: Self-pay | Admitting: Cardiothoracic Surgery

## 2019-01-31 ENCOUNTER — Other Ambulatory Visit: Payer: Self-pay

## 2019-01-31 ENCOUNTER — Ambulatory Visit
Admission: RE | Admit: 2019-01-31 | Discharge: 2019-01-31 | Disposition: A | Discharge status: Home / Self Care | Payer: PPO | Source: Ambulatory Visit | Attending: Cardiothoracic Surgery | Admitting: Cardiothoracic Surgery

## 2019-01-31 VITALS — BP 122/84 | HR 69 | Temp 97.7°F | Resp 16 | Ht 70.0 in | Wt 158.2 lb

## 2019-01-31 DIAGNOSIS — H2513 Age-related nuclear cataract, bilateral: Secondary | ICD-10-CM | POA: Diagnosis not present

## 2019-01-31 DIAGNOSIS — H524 Presbyopia: Secondary | ICD-10-CM | POA: Diagnosis not present

## 2019-01-31 DIAGNOSIS — Z09 Encounter for follow-up examination after completed treatment for conditions other than malignant neoplasm: Secondary | ICD-10-CM | POA: Diagnosis not present

## 2019-01-31 DIAGNOSIS — C3490 Malignant neoplasm of unspecified part of unspecified bronchus or lung: Secondary | ICD-10-CM

## 2019-01-31 DIAGNOSIS — J90 Pleural effusion, not elsewhere classified: Secondary | ICD-10-CM | POA: Diagnosis not present

## 2019-01-31 DIAGNOSIS — R911 Solitary pulmonary nodule: Secondary | ICD-10-CM

## 2019-01-31 DIAGNOSIS — H5203 Hypermetropia, bilateral: Secondary | ICD-10-CM | POA: Diagnosis not present

## 2019-01-31 DIAGNOSIS — H35723 Serous detachment of retinal pigment epithelium, bilateral: Secondary | ICD-10-CM | POA: Diagnosis not present

## 2019-01-31 NOTE — Progress Notes (Signed)
He has done well since his hospital discharge.  He has no complaints.  He is not taking any medications for pain control. He is not short of breath.  His lungs are clear and equal bilaterally.  His thoracotomy wound is healing as expected.  There are some Steri-Strips still present.  His left side is normal now.  His chest x-ray today looks good.  There is a small right pleural effusion.  There is no pneumothorax  I will see him back again in 2 months.

## 2019-01-31 NOTE — Patient Instructions (Signed)
Please call our questions or concerns. Please continue to follow up with Dr.Brahmanday. continue with your walking everyday.

## 2019-02-06 DIAGNOSIS — H25813 Combined forms of age-related cataract, bilateral: Secondary | ICD-10-CM | POA: Diagnosis not present

## 2019-02-06 DIAGNOSIS — H353121 Nonexudative age-related macular degeneration, left eye, early dry stage: Secondary | ICD-10-CM | POA: Diagnosis not present

## 2019-02-06 DIAGNOSIS — H35363 Drusen (degenerative) of macula, bilateral: Secondary | ICD-10-CM | POA: Diagnosis not present

## 2019-02-06 DIAGNOSIS — H353211 Exudative age-related macular degeneration, right eye, with active choroidal neovascularization: Secondary | ICD-10-CM | POA: Diagnosis not present

## 2019-02-06 DIAGNOSIS — H35723 Serous detachment of retinal pigment epithelium, bilateral: Secondary | ICD-10-CM | POA: Diagnosis not present

## 2019-02-10 DIAGNOSIS — G8912 Acute post-thoracotomy pain: Secondary | ICD-10-CM | POA: Diagnosis not present

## 2019-02-10 DIAGNOSIS — I2510 Atherosclerotic heart disease of native coronary artery without angina pectoris: Secondary | ICD-10-CM | POA: Diagnosis not present

## 2019-02-10 DIAGNOSIS — Z87891 Personal history of nicotine dependence: Secondary | ICD-10-CM | POA: Diagnosis not present

## 2019-02-10 DIAGNOSIS — J852 Abscess of lung without pneumonia: Secondary | ICD-10-CM | POA: Diagnosis not present

## 2019-02-10 DIAGNOSIS — F3012 Manic episode without psychotic symptoms, moderate: Secondary | ICD-10-CM | POA: Diagnosis not present

## 2019-02-10 DIAGNOSIS — Z7982 Long term (current) use of aspirin: Secondary | ICD-10-CM | POA: Diagnosis not present

## 2019-02-10 DIAGNOSIS — J449 Chronic obstructive pulmonary disease, unspecified: Secondary | ICD-10-CM | POA: Diagnosis not present

## 2019-02-10 DIAGNOSIS — M6281 Muscle weakness (generalized): Secondary | ICD-10-CM | POA: Diagnosis not present

## 2019-02-10 DIAGNOSIS — Z48813 Encounter for surgical aftercare following surgery on the respiratory system: Secondary | ICD-10-CM | POA: Diagnosis not present

## 2019-02-10 DIAGNOSIS — F419 Anxiety disorder, unspecified: Secondary | ICD-10-CM | POA: Diagnosis not present

## 2019-02-10 NOTE — Progress Notes (Signed)
$'@Patient'a$  ID: [name], male    DOB: [birth year], 74 y.o.   MRN: [id number]  Chief Complaint  Patient presents with  . Follow-up    Pt states she he has been doing pretty good. Pt denies any cough, SOB, wheezing, fever, or chills.    Referring provider: Birdie Sons, MD  HPI: 74 year old male, former smoker quit November 2020 (37 pack year hx). PMH significant for mild COPD (FEV1 80%), abnormal CT chest- LUL spiculated nodule s/p bx 2017 consistent with abscess. Treated with Augmentin. PET scan in 2018 minimally changed LUL lesion with calcification. No further imaging needed.  Patient of Dr. Mortimer Fries, last seen on 12/11/18. CXR showed three lesions right lung between 7-79m which have slightly increased over the last several months. Family hesitant to undergo invasive procedure, patient would need thoracotomy with wedge resection. Recommending patient be treated with Augmentin '875mg'$  BID x 2 weeks and repeat CT chest in 4 weeks.  CT chest ordered by Dr. OGenevive Bion 12/25/18 showed enlarging cavitary nodules right upper and lower lobes, spiculated soft tissue LUL is stable and represents abscess on prior biopsy. Family/apteint met with Dr. OGenevive Biwith general surgery on 12/27/18 and elected to precede with surgery. Patient underwent thoracotomy on 01/06/19, pathology positive stage 1 squamous cell lung cancer.    02/12/2019 Patient presents today for 2 month follow-up. Accompanied by her daughter and another male. S/p muscle-sparing thoracotomy with wedge resection of right upper and right lower lobe masseswith Dr OGenevive Biin December 2020.  Daughter reports that he is doing quite well since surgery, he has one more follow-up with Dr. OGenevive Biin March. He quit smoking in November. Reports that his cough has improved. No shortness of breath. Some tenderness to right chest wall from surgery. He is walking and staying somewhat active. Eating and drinking well, he did lose some weight after surgery. Denies  fever, chills, sweats, chest pain, chest tightness, wheezing, N/V/D.    Allergies  Allergen Reactions  . Ropinirole Hcl Other (See Comments)    Confusion, agiitation, disorientation, Hallucinations  . Tramadol     lethargy    Immunization History  Administered Date(s) Administered  . Fluad Quad(high Dose 65+) 10/28/2018  . Influenza, High Dose Seasonal PF 10/08/2015, 10/31/2016, 10/27/2017  . Pneumococcal Conjugate-13 07/14/2013  . Pneumococcal Polysaccharide-23 07/04/2011, 01/07/2019  . Tdap 06/04/2008  . Zoster 06/08/2009    Past Medical History:  Diagnosis Date  . Arthritis    in his fingers  . Bipolar disorder ([place]   . BPH (benign prostatic hyperplasia)   . Depression   . Emphysema   . Episodic confusion    Possible bipolar disorder  . GERD (gastroesophageal reflux disease)   . History of chicken pox   . History of hiatal hernia   . HNP (herniated nucleus pulposus), lumbar   . Hyperlipidemia   . Memory deficit 07/31/2012  . OCD (obsessive compulsive disorder)   . Peripheral neuropathy   . Restless leg syndrome 01/10/2003  . Unspecified psychosis 03/01/2012    Tobacco History: Social History   Tobacco Use  Smoking Status Former Smoker  . Packs/day: 0.75  . Years: 50.00  . Pack years: 37.50  . Types: Cigarettes  . Quit date: 11/26/2018  . Years since quitting: 0.2  Smokeless Tobacco Never Used   Counseling given: Not Answered   Outpatient Medications Prior to Visit  Medication Sig Dispense Refill  . aspirin 81 MG tablet Take 1 tablet (81 mg total) by mouth  daily.    . cholecalciferol (VITAMIN D3) 25 MCG (1000 UT) tablet Take 1,000 Units by mouth daily.    . Coenzyme Q10 (COQ10) 100 MG CAPS Take 100 mg by mouth daily.     . divalproex (DEPAKOTE ER) 500 MG 24 hr tablet Take 1,000 mg by mouth at bedtime.     . Omega-3 Fatty Acids (FISH OIL PO) Take 1 capsule by mouth daily.    [name] omeprazole (PRILOSEC) 40 MG capsule Take 1 capsule (40 mg total) by mouth  daily. 90 capsule 2  . simvastatin (ZOCOR) 40 MG tablet TAKE 1 TABLET (40 MG TOTAL) BY MOUTH DAILY AT 6 PM. 90 tablet 4  . vitamin B-12 (CYANOCOBALAMIN) 1000 MCG tablet Take 1,000 mcg by mouth daily.    . ziprasidone (GEODON) 40 MG capsule Take 40 mg by mouth daily with supper.      No facility-administered medications prior to visit.   Review of Systems  Review of Systems  Constitutional: Negative for appetite change, chills and fatigue.  Respiratory: Negative for apnea, cough, shortness of breath and wheezing.   Cardiovascular: Negative.   Gastrointestinal: Negative.    Physical Exam  BP 112/74 (BP Location: Left Arm, Patient Position: Sitting, Cuff Size: Large)   Pulse 68   Temp (!) 97.4 F (36.3 C) (Temporal)   Ht '5\' 10"'$  (1.778 m)   Wt 157 lb (71.2 kg)   SpO2 97% Comment: on ra  BMI 22.53 kg/m  Physical Exam Constitutional:      Appearance: Normal appearance.  HENT:     Head: Normocephalic and atraumatic.     Mouth/Throat:     Comments: Deferred d/t masking Cardiovascular:     Rate and Rhythm: Normal rate and regular rhythm.  Pulmonary:     Effort: Pulmonary effort is normal.     Breath sounds: Normal breath sounds. No wheezing or rhonchi.     Comments: CTA, diminished right upper lobe Musculoskeletal:        General: Normal range of motion.  Skin:    General: Skin is warm and dry.     Comments: Inc with steri strips and lap sites to lateral right chest wall. No erythema, warmth, swelling or drainage.   Neurological:     General: No focal deficit present.     Mental Status: He is alert and oriented to person, place, and time. Mental status is at baseline.  Psychiatric:        Mood and Affect: Mood normal.        Behavior: Behavior normal.        Thought Content: Thought content normal.        Judgment: Judgment normal.      Lab Results:  CBC    Component Value Date/Time   WBC 11.5 (H) 01/08/2019 1258   RBC 3.74 (L) 01/08/2019 1258   HGB 12.5 (L)  01/08/2019 1258   HGB 14.2 11/13/2018 1035   HCT 37.4 (L) 01/08/2019 1258   HCT 41.0 11/13/2018 1035   PLT 149 (L) 01/08/2019 1258   PLT 139 (L) 11/13/2018 1035   MCV 100.0 01/08/2019 1258   MCV 99 (H) 11/13/2018 1035   MCV 97 07/20/2012 1807   MCH 33.4 01/08/2019 1258   MCHC 33.4 01/08/2019 1258   RDW 13.1 01/08/2019 1258   RDW 13.1 11/13/2018 1035   RDW 13.8 07/20/2012 1807   LYMPHSABS 2.2 01/01/2019 1507   LYMPHSABS 1.6 07/31/2012 1047   MONOABS 0.6 01/01/2019 1507   EOSABS 0.2  01/01/2019 1507   EOSABS 0.2 07/31/2012 1047   BASOSABS 0.1 01/01/2019 1507   BASOSABS 0.0 07/31/2012 1047    BMET    Component Value Date/Time   NA 138 01/08/2019 1258   NA 145 (H) 11/13/2018 1035   NA 142 07/20/2012 1807   K 4.2 01/08/2019 1258   K 3.8 07/20/2012 1807   CL 102 01/08/2019 1258   CL 109 (H) 07/20/2012 1807   CO2 24 01/08/2019 1258   CO2 23 07/20/2012 1807   GLUCOSE 117 (H) 01/08/2019 1258   GLUCOSE 118 (H) 07/20/2012 1807   BUN 23 01/08/2019 1258   BUN 16 11/13/2018 1035   BUN 24 (H) 07/20/2012 1807   CREATININE 0.98 01/08/2019 1258   CREATININE 1.45 (H) 07/20/2012 1807   CALCIUM 8.9 01/08/2019 1258   CALCIUM 10.1 07/20/2012 1807   GFRNONAA >60 01/08/2019 1258   GFRNONAA 50 (L) 07/20/2012 1807   GFRAA >60 01/08/2019 1258   GFRAA 58 (L) 07/20/2012 1807    BNP No results found for: BNP  ProBNP No results found for: PROBNP  Imaging: DG Chest 2 View  Result Date: 01/31/2019 CLINICAL DATA:  Status post right thoracotomy with wedge resection on 01/06/2019. EXAM: CHEST - 2 VIEW COMPARISON:  01/09/2019 FINDINGS: Interval removal of right apical chest tube. No pneumothorax is seen. Small right pleural effusion. Postsurgical changes in the right Left lung is grossly clear. Surgical clips in the left hilar region. Underlying chronic interstitial markings. The heart is normal in size. Mild degenerative changes of the visualized thoracolumbar spine. IMPRESSION: Interval removal  of right apical chest tube. No pneumothorax is seen. Small right pleural effusion. Electronically Signed   By: Julian Hy M.D.   On: 01/31/2019 09:40     Assessment & Plan:   Cancer of upper lobe of right lung [facility]) S/p thoracotomy with wedge resection of right upper and right lower lobe masses with Dr Genevive Bi in December 2020. Doing well, no respiratory symptoms. Next follow-up in March    COPD (chronic obstructive pulmonary disease) (HCC) Stable, no shortness or breath or cough Not on maintenance inhaler or SABA FU in 6 months with Dr. Mortimer Fries and PFTs   Smoking greater than 30 pack years Quit smoking in November 2020   Martyn Ehrich, NP 02/12/2019

## 2019-02-11 DIAGNOSIS — H353211 Exudative age-related macular degeneration, right eye, with active choroidal neovascularization: Secondary | ICD-10-CM | POA: Diagnosis not present

## 2019-02-12 ENCOUNTER — Ambulatory Visit: Payer: PPO | Admitting: Primary Care

## 2019-02-12 ENCOUNTER — Other Ambulatory Visit: Payer: Self-pay

## 2019-02-12 ENCOUNTER — Encounter: Payer: Self-pay | Admitting: Primary Care

## 2019-02-12 VITALS — BP 112/74 | HR 68 | Temp 97.4°F | Ht 70.0 in | Wt 157.0 lb

## 2019-02-12 DIAGNOSIS — J449 Chronic obstructive pulmonary disease, unspecified: Secondary | ICD-10-CM

## 2019-02-12 DIAGNOSIS — C3411 Malignant neoplasm of upper lobe, right bronchus or lung: Secondary | ICD-10-CM | POA: Diagnosis not present

## 2019-02-12 DIAGNOSIS — F17210 Nicotine dependence, cigarettes, uncomplicated: Secondary | ICD-10-CM

## 2019-02-12 NOTE — Assessment & Plan Note (Signed)
Quit smoking in November 2020

## 2019-02-12 NOTE — Patient Instructions (Signed)
Pleasure seeing you today Dr. Carman Ching, glad you are doing well  Follow-up: 6 months with Dr. Mortimer Fries with PFTs     COPD and Physical Activity Chronic obstructive pulmonary disease (COPD) is a long-term (chronic) condition that affects the lungs. COPD is a general term that can be used to describe many different lung problems that cause lung swelling (inflammation) and limit airflow, including chronic bronchitis and emphysema. The main symptom of COPD is shortness of breath, which makes it harder to do even simple tasks. This can also make it harder to exercise and be active. Talk with your health care provider about treatments to help you breathe better and actions you can take to prevent breathing problems during physical activity. What are the benefits of exercising with COPD? Exercising regularly is an important part of a healthy lifestyle. You can still exercise and do physical activities even though you have COPD. Exercise and physical activity improve your shortness of breath by increasing blood flow (circulation). This causes your heart to pump more oxygen through your body. Moderate exercise can improve your:  Oxygen use.  Energy level.  Shortness of breath.  Strength in your breathing muscles.  Heart health.  Sleep.  Self-esteem and feelings of self-worth.  Depression, stress, and anxiety levels. Exercise can benefit everyone with COPD. The severity of your disease may affect how hard you can exercise, especially at first, but everyone can benefit. Talk with your health care provider about how much exercise is safe for you, and which activities and exercises are safe for you. What actions can I take to prevent breathing problems during physical activity?  Sign up for a pulmonary rehabilitation program. This type of program may include: ? Education about lung diseases. ? Exercise classes that teach you how to exercise and be more active while improving your breathing. This  usually involves:  Exercise using your lower extremities, such as a stationary bicycle.  About 30 minutes of exercise, 2 to 5 times per week, for 6 to 12 weeks  Strength training, such as push ups or leg lifts. ? Nutrition education. ? Group classes in which you can talk with others who also have COPD and learn ways to manage stress.  If you use an oxygen tank, you should use it while you exercise. Work with your health care provider to adjust your oxygen for your physical activity. Your resting flow rate is different from your flow rate during physical activity.  While you are exercising: ? Take slow breaths. ? Pace yourself and do not try to go too fast. ? Purse your lips while breathing out. Pursing your lips is similar to a kissing or whistling position. ? If doing exercise that uses a quick burst of effort, such as weight lifting:  Breathe in before starting the exercise.  Breathe out during the hardest part of the exercise (such as raising the weights). Where to find support You can find support for exercising with COPD from:  Your health care provider.  A pulmonary rehabilitation program.  Your local health department or community health programs.  Support groups, online or in-person. Your health care provider may be able to recommend support groups. Where to find more information You can find more information about exercising with COPD from:  American Lung Association: ClassInsider.se.  COPD Foundation: https://www.rivera.net/. Contact a health care provider if:  Your symptoms get worse.  You have chest pain.  You have nausea.  You have a fever.  You have trouble talking or catching  your breath.  You want to start a new exercise program or a new activity. Summary  COPD is a general term that can be used to describe many different lung problems that cause lung swelling (inflammation) and limit airflow. This includes chronic bronchitis and emphysema.  Exercise and  physical activity improve your shortness of breath by increasing blood flow (circulation). This causes your heart to provide more oxygen to your body.  Contact your health care provider before starting any exercise program or new activity. Ask your health care provider what exercises and activities are safe for you. This information is not intended to replace advice given to you by your health care provider. Make sure you discuss any questions you have with your health care provider. Document Revised: 04/17/2018 Document Reviewed: 01/18/2017 Elsevier Patient Education  2020 [employer].

## 2019-02-12 NOTE — Assessment & Plan Note (Signed)
S/p thoracotomy with wedge resection of right upper and right lower lobe masses with Dr Genevive Bi in December 2020. Doing well, no respiratory symptoms. Next follow-up in March

## 2019-02-12 NOTE — Assessment & Plan Note (Signed)
Stable, no shortness or breath or cough Not on maintenance inhaler or SABA FU in 6 months with Dr. Mortimer Fries and PFTs

## 2019-02-25 ENCOUNTER — Other Ambulatory Visit: Payer: Self-pay

## 2019-02-25 ENCOUNTER — Encounter: Payer: Self-pay | Admitting: Gastroenterology

## 2019-02-25 ENCOUNTER — Ambulatory Visit: Payer: PPO | Admitting: Gastroenterology

## 2019-02-25 VITALS — BP 112/68 | HR 76 | Temp 98.2°F | Ht 70.0 in | Wt 158.2 lb

## 2019-02-25 DIAGNOSIS — R1310 Dysphagia, unspecified: Secondary | ICD-10-CM

## 2019-02-25 DIAGNOSIS — R1319 Other dysphagia: Secondary | ICD-10-CM

## 2019-02-25 NOTE — H&P (View-Only) (Signed)
Gastroenterology Consultation  Referring Provider:     Birdie Sons, MD Primary Care Physician:  Birdie Sons, MD Primary Gastroenterologist:  Dr. Allen Norris     Reason for Consultation:     Dysphagia        HPI:   [name] is a 74 y.o. y/o male referred for consultation & management of dysphagia by Dr. Caryn Section, Kirstie Peri, MD.  This patient comes to me after having a history of dysphagia.  The patient was recently in the hospital and taken care of by Dr. Genevive Bi for a lung mass.  The patient was found to have a 3.5 mm squamous cell carcinoma in the right upper lung with an abscess right lower lung.  The patient now reports that he is no longer smoking.  He has been having trouble swallowing and states it is worse with solids more than it is liquids.  There is no report of any black stools or bloody stools.  The patient does report that he has been losing weight without trying.  He is not sure if it is because of all the things he has been going through with his lungs or because he is having difficulty swallowing.  Past Medical History:  Diagnosis Date  . Arthritis    in his fingers  . Bipolar disorder ([place])   . BPH (benign prostatic hyperplasia)   . Depression   . Emphysema   . Episodic confusion    Possible bipolar disorder  . GERD (gastroesophageal reflux disease)   . History of chicken pox   . History of hiatal hernia   . HNP (herniated nucleus pulposus), lumbar   . Hyperlipidemia   . Memory deficit 07/31/2012  . OCD (obsessive compulsive disorder)   . Peripheral neuropathy   . Restless leg syndrome 01/10/2003  . Unspecified psychosis 03/01/2012    Past Surgical History:  Procedure Laterality Date  . BACK SURGERY    . COLONOSCOPY    . ESOPHAGOGASTRODUODENOSCOPY    . FLEXIBLE BRONCHOSCOPY [name] 01/06/2019   Procedure: FLEXIBLE BRONCHOSCOPY;  Surgeon: [name], MD;  Location: [facility];  Service: Thoracic;  Laterality: [name];  . liver excision     for non-cancerous  mass  . LUMBAR LAMINECTOMY/DECOMPRESSION MICRODISCECTOMY Left 06/22/2017   Procedure: Extraforaminal Microdiscectomy  - L4-L5 - left;  Surgeon: Eustace Moore, MD;  Location: [place];  Service: Neurosurgery;  Laterality: Left;  . LUNG SURGERY  2005   Lipoma Removal  . MAXIMUM ACCESS (MAS) TRANSFORAMINAL LUMBAR INTERBODY FUSION (TLIF) 1 LEVEL [name] 02/18/2018   Procedure: Transforaminal Lumbar Interbody Fusion - Lumbar Four - Lumbar Five;  Surgeon: Eustace Moore, MD;  Location: [place];  Service: Neurosurgery;  Laterality: [name];  Transforaminal Lumbar Interbody Fusion - Lumbar Four - Lumbar Five  . THORACOTOMY Right 01/06/2019   Procedure: THORACOTOMY MAJOR;  Surgeon: [name], MD;  Location: [facility];  Service: Thoracic;  Laterality: Right;  [name] VIDEO ASSISTED THORACOSCOPY (VATS)/WEDGE RESECTION Right 01/06/2019   Procedure: VIDEO ASSISTED THORACOSCOPY (VATS)/WEDGE RESECTION-RUL and RLL-lung resection;  Surgeon: [name], MD;  Location: [facility];  Service: Thoracic;  Laterality: Right;    Prior to Admission medications   Medication Sig Start Date End Date Taking? Authorizing Provider  aspirin 81 MG tablet Take 1 tablet (81 mg total) by mouth daily. 08/06/17  Yes Birdie Sons, MD  cholecalciferol (VITAMIN D3) 25 MCG (1000 UT) tablet Take 1,000 Units by mouth daily.   Yes [provider]  Coenzyme Q10 (COQ10) 100 MG CAPS Take 100 mg by mouth daily.    Yes [provider]  divalproex (DEPAKOTE ER) 500 MG 24 hr tablet Take 1,000 mg by mouth at bedtime.    Yes [provider]  Omega-3 Fatty Acids (FISH OIL PO) Take 1 capsule by mouth daily.   Yes [provider]  omeprazole (PRILOSEC) 40 MG capsule Take 1 capsule (40 mg total) by mouth daily. 12/27/18  Yes Birdie Sons, MD  simvastatin (ZOCOR) 40 MG tablet TAKE 1 TABLET (40 MG TOTAL) BY MOUTH DAILY AT 6 PM. 12/24/17  Yes Birdie Sons, MD  vitamin B-12 (CYANOCOBALAMIN) 1000 MCG tablet Take 1,000 mcg by  mouth daily.   Yes [provider]  ziprasidone (GEODON) 40 MG capsule Take 40 mg by mouth daily with supper.    Yes [provider]    Family History  Problem Relation Age of Onset  . Heart disease Mother   . Cancer Father        lung  . Heart disease Father   . Diabetes Sister   . Heart disease Sister   . Seizures Paternal Grandfather      Social History   Tobacco Use  . Smoking status: Former Smoker    Packs/day: 0.75    Years: 50.00    Pack years: 37.50    Types: Cigarettes    Quit date: 11/26/2018    Years since quitting: 0.2  . Smokeless tobacco: Never Used  Substance Use Topics  . Alcohol use: No  . Drug use: No    Allergies as of 02/25/2019 - Review Complete 02/25/2019  Allergen Reaction Noted  . Ropinirole hcl Other (See Comments) 07/08/2014  . Tramadol  01/16/2019    Review of Systems:    All systems reviewed and negative except where noted in HPI.   Physical Exam:  BP 112/68   Pulse 76   Temp 98.2 F (36.8 C) (Oral)   Ht _0  (1.778 m)   Wt 158 lb 3.2 oz (71.8 kg)   BMI 22.70 kg/m  No LMP for male patient. General:   Alert,  Well-developed, well-nourished, pleasant and cooperative in NAD Head:  Normocephalic and atraumatic. Eyes:  Sclera clear, no icterus.   Conjunctiva pink. Ears:  Normal auditory acuity. Neck:  Supple; no masses or thyromegaly. Lungs:  Respirations even and unlabored.  Clear throughout to auscultation.   No wheezes, crackles, or rhonchi. No acute distress. Heart:  Regular rate and rhythm; no murmurs, clicks, rubs, or gallops. Abdomen:  Normal bowel sounds.  No bruits.  Soft, non-tender and non-distended without masses, hepatosplenomegaly or hernias noted.  No guarding or rebound tenderness.  Negative Carnett sign.   Rectal:  Deferred.  Pulses:  Normal pulses noted. Extremities:  No clubbing or edema.  No cyanosis. Neurologic:  Alert and oriented x3;  grossly normal neurologically. Skin:  Intact without  significant lesions or rashes.  No jaundice. Lymph Nodes:  No significant cervical adenopathy. Psych:  Alert and cooperative. Normal mood and affect.  Imaging Studies: DG Chest 2 View  Result Date: 01/31/2019 CLINICAL DATA:  Status post right thoracotomy with wedge resection on 01/06/2019. EXAM: CHEST - 2 VIEW COMPARISON:  01/09/2019 FINDINGS: Interval removal of right apical chest tube. No pneumothorax is seen. Small right pleural effusion. Postsurgical changes in the right Left lung is grossly clear. Surgical clips in the left hilar region. Underlying chronic interstitial markings. The heart is normal in size. Mild degenerative changes  of the visualized thoracolumbar spine. IMPRESSION: Interval removal of right apical chest tube. No pneumothorax is seen. Small right pleural effusion. Electronically Signed   By: Julian Hy M.D.   On: 01/31/2019 09:40    Assessment and Plan:   [name] is a 74 y.o. y/o male who comes in today with a history of dysphagia.  The patient has dysphagia more to solids than liquids.  The patient had has a recent history of a lung resection with a incidental lung cancer found at that time.  The patient will be set up for an EGD to look for any strictures or pathology as the cause of his symptoms.  The patient has been explained the plan and agrees with it.    Lucilla Lame, MD. Marval Regal    Note: This dictation was prepared with Dragon dictation along with smaller phrase technology. Any transcriptional errors that result from this process are unintentional.

## 2019-02-25 NOTE — Progress Notes (Signed)
  Gastroenterology Consultation  Referring Provider:     Fisher, Donald E, MD Primary Care Physician:  Fisher, Donald E, MD Primary Gastroenterologist:  Dr. Chyenne Sobczak     Reason for Consultation:     Dysphagia        HPI:   [name] is a 73 y.o. y/o male referred for consultation & management of dysphagia by Dr. Fisher, Donald E, MD.  This patient comes to me after having a history of dysphagia.  The patient was recently in the hospital and taken care of by Dr. Oaks for a lung mass.  The patient was found to have a 3.5 mm squamous cell carcinoma in the right upper lung with an abscess right lower lung.  The patient now reports that he is no longer smoking.  He has been having trouble swallowing and states it is worse with solids more than it is liquids.  There is no report of any black stools or bloody stools.  The patient does report that he has been losing weight without trying.  He is not sure if it is because of all the things he has been going through with his lungs or because he is having difficulty swallowing.  Past Medical History:  Diagnosis Date  . Arthritis    in his fingers  . Bipolar disorder (HCC)   . BPH (benign prostatic hyperplasia)   . Depression   . Emphysema   . Episodic confusion    Possible bipolar disorder  . GERD (gastroesophageal reflux disease)   . History of chicken pox   . History of hiatal hernia   . HNP (herniated nucleus pulposus), lumbar   . Hyperlipidemia   . Memory deficit 07/31/2012  . OCD (obsessive compulsive disorder)   . Peripheral neuropathy   . Restless leg syndrome 01/10/2003  . Unspecified psychosis 03/01/2012    Past Surgical History:  Procedure Laterality Date  . BACK SURGERY    . COLONOSCOPY    . ESOPHAGOGASTRODUODENOSCOPY    . FLEXIBLE BRONCHOSCOPY [name] 01/06/2019   Procedure: FLEXIBLE BRONCHOSCOPY;  Surgeon: Oaks, Timothy, MD;  Location: [facility] ORS;  Service: Thoracic;  Laterality: [name];  . liver excision     for non-cancerous  mass  . LUMBAR LAMINECTOMY/DECOMPRESSION MICRODISCECTOMY Left 06/22/2017   Procedure: Extraforaminal Microdiscectomy  - L4-L5 - left;  Surgeon: Jones, David S, MD;  Location: [facility] OR;  Service: Neurosurgery;  Laterality: Left;  . LUNG SURGERY  2005   Lipoma Removal  . MAXIMUM ACCESS (MAS) TRANSFORAMINAL LUMBAR INTERBODY FUSION (TLIF) 1 LEVEL [name] 02/18/2018   Procedure: Transforaminal Lumbar Interbody Fusion - Lumbar Four - Lumbar Five;  Surgeon: Jones, David S, MD;  Location: [facility] OR;  Service: Neurosurgery;  Laterality: [name];  Transforaminal Lumbar Interbody Fusion - Lumbar Four - Lumbar Five  . THORACOTOMY Right 01/06/2019   Procedure: THORACOTOMY MAJOR;  Surgeon: Oaks, Timothy, MD;  Location: [facility] ORS;  Service: Thoracic;  Laterality: Right;  . VIDEO ASSISTED THORACOSCOPY (VATS)/WEDGE RESECTION Right 01/06/2019   Procedure: VIDEO ASSISTED THORACOSCOPY (VATS)/WEDGE RESECTION-RUL and RLL-lung resection;  Surgeon: Oaks, Timothy, MD;  Location: [facility] ORS;  Service: Thoracic;  Laterality: Right;    Prior to Admission medications   Medication Sig Start Date End Date Taking? Authorizing Provider  aspirin 81 MG tablet Take 1 tablet (81 mg total) by mouth daily. 08/06/17  Yes Fisher, Donald E, MD  cholecalciferol (VITAMIN D3) 25 MCG (1000 UT) tablet Take 1,000 Units by mouth daily.   Yes [provider]    Coenzyme Q10 (COQ10) 100 MG CAPS Take 100 mg by mouth daily.    Yes [provider]  divalproex (DEPAKOTE ER) 500 MG 24 hr tablet Take 1,000 mg by mouth at bedtime.    Yes [provider]  Omega-3 Fatty Acids (FISH OIL PO) Take 1 capsule by mouth daily.   Yes [provider]  omeprazole (PRILOSEC) 40 MG capsule Take 1 capsule (40 mg total) by mouth daily. 12/27/18  Yes Fisher, Donald E, MD  simvastatin (ZOCOR) 40 MG tablet TAKE 1 TABLET (40 MG TOTAL) BY MOUTH DAILY AT 6 PM. 12/24/17  Yes Fisher, Donald E, MD  vitamin B-12 (CYANOCOBALAMIN) 1000 MCG tablet Take 1,000 mcg by  mouth daily.   Yes [provider]  ziprasidone (GEODON) 40 MG capsule Take 40 mg by mouth daily with supper.    Yes [provider]    Family History  Problem Relation Age of Onset  . Heart disease Mother   . Cancer Father        lung  . Heart disease Father   . Diabetes Sister   . Heart disease Sister   . Seizures Paternal Grandfather      Social History   Tobacco Use  . Smoking status: Former Smoker    Packs/day: 0.75    Years: 50.00    Pack years: 37.50    Types: Cigarettes    Quit date: 11/26/2018    Years since quitting: 0.2  . Smokeless tobacco: Never Used  Substance Use Topics  . Alcohol use: No  . Drug use: No    Allergies as of 02/25/2019 - Review Complete 02/25/2019  Allergen Reaction Noted  . Ropinirole hcl Other (See Comments) 07/08/2014  . Tramadol  01/16/2019    Review of Systems:    All systems reviewed and negative except where noted in HPI.   Physical Exam:  BP 112/68   Pulse 76   Temp 98.2 F (36.8 C) (Oral)   Ht 5' 10" (1.778 m)   Wt 158 lb 3.2 oz (71.8 kg)   BMI 22.70 kg/m  No LMP for male patient. General:   Alert,  Well-developed, well-nourished, pleasant and cooperative in NAD Head:  Normocephalic and atraumatic. Eyes:  Sclera clear, no icterus.   Conjunctiva pink. Ears:  Normal auditory acuity. Neck:  Supple; no masses or thyromegaly. Lungs:  Respirations even and unlabored.  Clear throughout to auscultation.   No wheezes, crackles, or rhonchi. No acute distress. Heart:  Regular rate and rhythm; no murmurs, clicks, rubs, or gallops. Abdomen:  Normal bowel sounds.  No bruits.  Soft, non-tender and non-distended without masses, hepatosplenomegaly or hernias noted.  No guarding or rebound tenderness.  Negative Carnett sign.   Rectal:  Deferred.  Pulses:  Normal pulses noted. Extremities:  No clubbing or edema.  No cyanosis. Neurologic:  Alert and oriented x3;  grossly normal neurologically. Skin:  Intact without  significant lesions or rashes.  No jaundice. Lymph Nodes:  No significant cervical adenopathy. Psych:  Alert and cooperative. Normal mood and affect.  Imaging Studies: DG Chest 2 View  Result Date: 01/31/2019 CLINICAL DATA:  Status post right thoracotomy with wedge resection on 01/06/2019. EXAM: CHEST - 2 VIEW COMPARISON:  01/09/2019 FINDINGS: Interval removal of right apical chest tube. No pneumothorax is seen. Small right pleural effusion. Postsurgical changes in the right Left lung is grossly clear. Surgical clips in the left hilar region. Underlying chronic interstitial markings. The heart is normal in size. Mild degenerative changes   of the visualized thoracolumbar spine. IMPRESSION: Interval removal of right apical chest tube. No pneumothorax is seen. Small right pleural effusion. Electronically Signed   By: Sriyesh  Krishnan M.D.   On: 01/31/2019 09:40    Assessment and Plan:   [name] is a 73 y.o. y/o male who comes in today with a history of dysphagia.  The patient has dysphagia more to solids than liquids.  The patient had has a recent history of a lung resection with a incidental lung cancer found at that time.  The patient will be set up for an EGD to look for any strictures or pathology as the cause of his symptoms.  The patient has been explained the plan and agrees with it.    Bexlee Bergdoll, MD. FACG    Note: This dictation was prepared with Dragon dictation along with smaller phrase technology. Any transcriptional errors that result from this process are unintentional.   

## 2019-02-26 ENCOUNTER — Other Ambulatory Visit
Admission: RE | Admit: 2019-02-26 | Discharge: 2019-02-26 | Disposition: A | Discharge status: Home / Self Care | Payer: PPO | Source: Ambulatory Visit | Attending: Gastroenterology | Admitting: Gastroenterology

## 2019-02-26 ENCOUNTER — Encounter: Payer: Self-pay | Admitting: Gastroenterology

## 2019-02-26 DIAGNOSIS — Z01812 Encounter for preprocedural laboratory examination: Secondary | ICD-10-CM | POA: Diagnosis not present

## 2019-02-26 DIAGNOSIS — Z20822 Contact with and (suspected) exposure to covid-19: Secondary | ICD-10-CM | POA: Insufficient documentation

## 2019-02-26 NOTE — Discharge Instructions (Signed)
General Anesthesia, Adult, Care After This sheet gives you information about how to care for yourself after your procedure. Your health care provider may also give you more specific instructions. If you have problems or questions, contact your health care provider. What can I expect after the procedure? After the procedure, the following side effects are common:  Pain or discomfort at the IV site.  Nausea.  Vomiting.  Sore throat.  Trouble concentrating.  Feeling cold or chills.  Weak or tired.  Sleepiness and fatigue.  Soreness and body aches. These side effects can affect parts of the body that were not involved in surgery. Follow these instructions at home:  For at least 24 hours after the procedure:  Have a responsible adult stay with you. It is important to have someone help care for you until you are awake and alert.  Rest as needed.  Do not: ? Participate in activities in which you could fall or become injured. ? Drive. ? Use heavy machinery. ? Drink alcohol. ? Take sleeping pills or medicines that cause drowsiness. ? Make important decisions or sign legal documents. ? Take care of children on your own. Eating and drinking  Follow any instructions from your health care provider about eating or drinking restrictions.  When you feel hungry, start by eating small amounts of foods that are soft and easy to digest (bland), such as toast. Gradually return to your regular diet.  Drink enough fluid to keep your urine pale yellow.  If you vomit, rehydrate by drinking water, juice, or clear broth. General instructions  If you have sleep apnea, surgery and certain medicines can increase your risk for breathing problems. Follow instructions from your health care provider about wearing your sleep device: ? Anytime you are sleeping, including during daytime naps. ? While taking prescription pain medicines, sleeping medicines, or medicines that make you drowsy.  Return to  your normal activities as told by your health care provider. Ask your health care provider what activities are safe for you.  Take over-the-counter and prescription medicines only as told by your health care provider.  If you smoke, do not smoke without supervision.  Keep all follow-up visits as told by your health care provider. This is important. Contact a health care provider if:  You have nausea or vomiting that does not get better with medicine.  You cannot eat or drink without vomiting.  You have pain that does not get better with medicine.  You are unable to pass urine.  You develop a skin rash.  You have a fever.  You have redness around your IV site that gets worse. Get help right away if:  You have difficulty breathing.  You have chest pain.  You have blood in your urine or stool, or you vomit blood. Summary  After the procedure, it is common to have a sore throat or nausea. It is also common to feel tired.  Have a responsible adult stay with you for the first 24 hours after general anesthesia. It is important to have someone help care for you until you are awake and alert.  When you feel hungry, start by eating small amounts of foods that are soft and easy to digest (bland), such as toast. Gradually return to your regular diet.  Drink enough fluid to keep your urine pale yellow.  Return to your normal activities as told by your health care provider. Ask your health care provider what activities are safe for you. This information is not   intended to replace advice given to you by your health care provider. Make sure you discuss any questions you have with your health care provider. Document Revised: 12/29/2016 Document Reviewed: 08/11/2016 Elsevier Patient Education  2020 Elsevier Inc.  

## 2019-02-27 LAB — SARS CORONAVIRUS 2 (TAT 6-24 HRS): SARS Coronavirus 2: NEGATIVE

## 2019-02-28 ENCOUNTER — Ambulatory Visit: Payer: PPO | Admitting: Anesthesiology

## 2019-02-28 ENCOUNTER — Other Ambulatory Visit: Payer: Self-pay

## 2019-02-28 ENCOUNTER — Encounter: Payer: Self-pay | Admitting: Gastroenterology

## 2019-02-28 ENCOUNTER — Encounter
Admission: RE | Disposition: A | Discharge status: Home / Self Care | Payer: Self-pay | Source: Ambulatory Visit | Attending: Gastroenterology

## 2019-02-28 ENCOUNTER — Ambulatory Visit
Admission: RE | Admit: 2019-02-28 | Discharge: 2019-02-28 | Disposition: A | Discharge status: Home / Self Care | Payer: PPO | Source: Ambulatory Visit | Attending: Gastroenterology | Admitting: Gastroenterology

## 2019-02-28 DIAGNOSIS — Z7982 Long term (current) use of aspirin: Secondary | ICD-10-CM | POA: Diagnosis not present

## 2019-02-28 DIAGNOSIS — I2510 Atherosclerotic heart disease of native coronary artery without angina pectoris: Secondary | ICD-10-CM | POA: Insufficient documentation

## 2019-02-28 DIAGNOSIS — Z85118 Personal history of other malignant neoplasm of bronchus and lung: Secondary | ICD-10-CM | POA: Diagnosis not present

## 2019-02-28 DIAGNOSIS — K222 Esophageal obstruction: Secondary | ICD-10-CM | POA: Diagnosis not present

## 2019-02-28 DIAGNOSIS — Z79899 Other long term (current) drug therapy: Secondary | ICD-10-CM | POA: Diagnosis not present

## 2019-02-28 DIAGNOSIS — R1319 Other dysphagia: Secondary | ICD-10-CM | POA: Diagnosis not present

## 2019-02-28 DIAGNOSIS — F209 Schizophrenia, unspecified: Secondary | ICD-10-CM | POA: Insufficient documentation

## 2019-02-28 DIAGNOSIS — Z87891 Personal history of nicotine dependence: Secondary | ICD-10-CM | POA: Insufficient documentation

## 2019-02-28 DIAGNOSIS — K219 Gastro-esophageal reflux disease without esophagitis: Secondary | ICD-10-CM | POA: Diagnosis not present

## 2019-02-28 DIAGNOSIS — E785 Hyperlipidemia, unspecified: Secondary | ICD-10-CM | POA: Insufficient documentation

## 2019-02-28 DIAGNOSIS — Z902 Acquired absence of lung [part of]: Secondary | ICD-10-CM | POA: Diagnosis not present

## 2019-02-28 DIAGNOSIS — R1310 Dysphagia, unspecified: Secondary | ICD-10-CM | POA: Diagnosis not present

## 2019-02-28 DIAGNOSIS — K449 Diaphragmatic hernia without obstruction or gangrene: Secondary | ICD-10-CM | POA: Diagnosis not present

## 2019-02-28 DIAGNOSIS — F319 Bipolar disorder, unspecified: Secondary | ICD-10-CM | POA: Diagnosis not present

## 2019-02-28 DIAGNOSIS — J439 Emphysema, unspecified: Secondary | ICD-10-CM | POA: Diagnosis not present

## 2019-02-28 DIAGNOSIS — Z801 Family history of malignant neoplasm of trachea, bronchus and lung: Secondary | ICD-10-CM | POA: Insufficient documentation

## 2019-02-28 HISTORY — PX: ESOPHAGOGASTRODUODENOSCOPY (EGD) WITH PROPOFOL: SHX5813

## 2019-02-28 HISTORY — DX: Presence of dental prosthetic device (complete) (partial): Z97.2

## 2019-02-28 HISTORY — DX: Presence of external hearing-aid: Z97.4

## 2019-02-28 SURGERY — ESOPHAGOGASTRODUODENOSCOPY (EGD) WITH PROPOFOL
Anesthesia: General | Site: Mouth

## 2019-02-28 MED ORDER — GLYCOPYRROLATE 0.2 MG/ML IJ SOLN
INTRAMUSCULAR | Status: DC | PRN
  Administered 2019-02-28: .2 mg via INTRAVENOUS

## 2019-02-28 MED ORDER — PROPOFOL 10 MG/ML IV BOLUS
INTRAVENOUS | Status: DC | PRN
  Administered 2019-02-28: 80 mg via INTRAVENOUS

## 2019-02-28 MED ORDER — LACTATED RINGERS IV SOLN
100.0000 mL/h | INTRAVENOUS | Status: DC
  Administered 2019-02-28: 100 mL/h via INTRAVENOUS

## 2019-02-28 SURGICAL SUPPLY — 9 items
BALLN DILATOR 15-18 8 (BALLOONS) ×2
BALLOON DILATOR 15-18 8 (BALLOONS) IMPLANT
BLOCK BITE 60FR ADLT L/F GRN (MISCELLANEOUS) ×2 IMPLANT
CANISTER SUCT 1200ML W/VALVE (MISCELLANEOUS) ×2 IMPLANT
GOWN CVR UNV OPN BCK APRN NK (MISCELLANEOUS) ×2 IMPLANT
GOWN ISOL THUMB LOOP REG UNIV (MISCELLANEOUS) ×4
KIT ENDO PROCEDURE OLY (KITS) ×2 IMPLANT
SYR INFLATION 60ML (SYRINGE) ×1 IMPLANT
WATER STERILE IRR 250ML POUR (IV SOLUTION) ×2 IMPLANT

## 2019-02-28 NOTE — Transfer of Care (Signed)
Immediate Anesthesia Transfer of Care Note  Patient: [name]  Procedure(s) Performed: ESOPHAGOGASTRODUODENOSCOPY (EGD) WITH DILATION ([name] Mouth)  Patient Location: PACU  Anesthesia Type: General  Level of Consciousness: awake, alert  and patient cooperative  Airway and Oxygen Therapy: Patient Spontanous Breathing and Patient connected to supplemental oxygen  Post-op Assessment: Post-op Vital signs reviewed, Patient's Cardiovascular Status Stable, Respiratory Function Stable, Patent Airway and No signs of Nausea or vomiting  Post-op Vital Signs: Reviewed and stable  Complications: No apparent anesthesia complications

## 2019-02-28 NOTE — Anesthesia Preprocedure Evaluation (Signed)
Anesthesia Evaluation    Airway Mallampati: II  TM Distance: >3 FB Neck ROM: Limited    Dental  (+) Upper Dentures, Lower Dentures   Pulmonary COPD, former smoker,   RUL lung cancer   breath sounds clear to auscultation       Cardiovascular + CAD   Rhythm:Regular Rate:Normal   HLD   Neuro/Psych PSYCHIATRIC DISORDERS Anxiety Depression Bipolar Disorder Schizophrenia  Neuromuscular disease (Peripheral neruopathy)    GI/Hepatic hiatal hernia, GERD  ,  Endo/Other  Hypothyroidism   Renal/GU      Musculoskeletal  (+) Arthritis ,   Abdominal   Peds  Hematology   Anesthesia Other Findings   Reproductive/Obstetrics                             Anesthesia Physical Anesthesia Plan  ASA: III  Anesthesia Plan: General   Post-op Pain Management:    Induction: Intravenous  PONV Risk Score and Plan: 2 and Propofol infusion, TIVA and Treatment may vary due to age or medical condition  Airway Management Planned: Natural Airway and Nasal Cannula  Additional Equipment:   Intra-op Plan:   Post-operative Plan:   Informed Consent: I have reviewed the patients History and Physical, chart, labs and discussed the procedure including the risks, benefits and alternatives for the proposed anesthesia with the patient or authorized representative who has indicated his/her understanding and acceptance.       Plan Discussed with: CRNA and Anesthesiologist  Anesthesia Plan Comments:         Anesthesia Quick Evaluation

## 2019-02-28 NOTE — Anesthesia Procedure Notes (Signed)
Procedure Name: General with mask airway Date/Time: 02/28/2019 11:56 AM Performed by: Jeannene Patella, CRNA Pre-anesthesia Checklist: Patient identified, Emergency Drugs available, Suction available, Patient being monitored and Timeout performed Patient Re-evaluated:Patient Re-evaluated prior to induction

## 2019-02-28 NOTE — Op Note (Signed)
[facility] Gastroenterology Patient Name: [name] Procedure Date: 02/28/2019 11:43 AM MRN: [id number] Account #: [id number] Date of Birth: 06/19/1945 Admit Type: Outpatient Age: 74 Room: [facility] OR ROOM 01 Gender: Male Note Status: Finalized Procedure:             Upper GI endoscopy Indications:           Dysphagia Providers:             Lucilla Lame MD, MD Referring MD:          Kirstie Peri. Caryn Section, MD (Referring MD) Medicines:             Propofol per Anesthesia Complications:         No immediate complications. Procedure:             Pre-Anesthesia Assessment:                        - Prior to the procedure, a History and Physical was                         performed, and patient medications and allergies were                         reviewed. The patient's tolerance of previous                         anesthesia was also reviewed. The risks and benefits                         of the procedure and the sedation options and risks                         were discussed with the patient. All questions were                         answered, and informed consent was obtained. Prior                         Anticoagulants: The patient has taken no previous                         anticoagulant or antiplatelet agents. ASA Grade                         Assessment: II - A patient with mild systemic disease.                         After reviewing the risks and benefits, the patient                         was deemed in satisfactory condition to undergo the                         procedure.                        After obtaining informed consent, the endoscope was  passed under direct vision. Throughout the procedure,                         the patient's blood pressure, pulse, and oxygen                         saturations were monitored continuously. The                         Endosonoscope was introduced through the mouth, and            advanced to the second part of duodenum. The upper GI                         endoscopy was accomplished without difficulty. The                         patient tolerated the procedure well. Findings:      A large hiatal hernia was present.      One benign-appearing, intrinsic mild stenosis was found at the       gastroesophageal junction. The stenosis was traversed. A TTS dilator was       passed through the scope. Dilation with a 15-16.5-18 mm balloon dilator       was performed to 18 mm. The dilation site was examined following       endoscope reinsertion and showed complete resolution of luminal       narrowing.      The stomach was normal.      The examined duodenum was normal. Impression:            - Large hiatal hernia.                        - Benign-appearing esophageal stenosis. Dilated.                        - Normal stomach.                        - Normal examined duodenum.                        - No specimens collected. Recommendation:        - Discharge patient to home.                        - Resume previous diet.                        - Continue present medications. Procedure Code(s):     --- Professional ---                        [phone], Esophagogastroduodenoscopy, flexible,                         transoral; with transendoscopic balloon dilation of                         esophagus (less than 30 mm diameter) Diagnosis Code(s):     --- Professional ---  R13.10, Dysphagia, unspecified                        K22.2, Esophageal obstruction CPT copyright 2019 American Medical Association. All rights reserved. The codes documented in this report are preliminary and upon coder review may  be revised to meet current compliance requirements. Lucilla Lame MD, MD 02/28/2019 11:57:31 AM This report has been signed electronically. Number of Addenda: 0 Note Initiated On: 02/28/2019 11:43 AM Total Procedure Duration: 0 hours 4 minutes 53 seconds   Estimated Blood Loss:  Estimated blood loss: none.      [facility]

## 2019-02-28 NOTE — Interval H&P Note (Signed)
History and Physical Interval Note:  02/28/2019 11:27 AM  [name]  has presented today for surgery, with the diagnosis of Dysphagia R13.10.  The various methods of treatment have been discussed with the patient and family. After consideration of risks, benefits and other options for treatment, the patient has consented to  Procedure(s) with comments: ESOPHAGOGASTRODUODENOSCOPY (EGD) WITH PROPOFOL ([name]) - Priority 3 as a surgical intervention.  The patient's history has been reviewed, patient examined, no change in status, stable for surgery.  I have reviewed the patient's chart and labs.  Questions were answered to the patient's satisfaction.     [name] [employer]

## 2019-02-28 NOTE — Anesthesia Postprocedure Evaluation (Signed)
Anesthesia Post Note  Patient: [name]  Procedure(s) Performed: ESOPHAGOGASTRODUODENOSCOPY (EGD) WITH DILATION ([name] Mouth)     Patient location during evaluation: PACU Anesthesia Type: General Level of consciousness: awake and alert Pain management: pain level controlled Vital Signs Assessment: post-procedure vital signs reviewed and stable Respiratory status: spontaneous breathing, nonlabored ventilation, respiratory function stable and patient connected to nasal cannula oxygen Cardiovascular status: blood pressure returned to baseline and stable Postop Assessment: no apparent nausea or vomiting Anesthetic complications: no    Shakedra Beam A  Meshach Perry

## 2019-03-03 ENCOUNTER — Encounter: Payer: Self-pay | Admitting: *Deleted

## 2019-03-09 ENCOUNTER — Other Ambulatory Visit: Payer: Self-pay | Admitting: Family Medicine

## 2019-03-14 DIAGNOSIS — H353211 Exudative age-related macular degeneration, right eye, with active choroidal neovascularization: Secondary | ICD-10-CM | POA: Diagnosis not present

## 2019-03-18 DIAGNOSIS — F3172 Bipolar disorder, in full remission, most recent episode hypomanic: Secondary | ICD-10-CM | POA: Diagnosis not present

## 2019-03-21 ENCOUNTER — Ambulatory Visit
Admission: RE | Admit: 2019-03-21 | Discharge: 2019-03-21 | Disposition: A | Discharge status: Home / Self Care | Payer: PPO | Source: Ambulatory Visit | Attending: Cardiothoracic Surgery | Admitting: Cardiothoracic Surgery

## 2019-03-21 ENCOUNTER — Encounter: Payer: Self-pay | Admitting: Cardiothoracic Surgery

## 2019-03-21 ENCOUNTER — Ambulatory Visit (INDEPENDENT_AMBULATORY_CARE_PROVIDER_SITE_OTHER): Payer: Self-pay | Admitting: Cardiothoracic Surgery

## 2019-03-21 ENCOUNTER — Encounter: Payer: PPO | Admitting: Cardiothoracic Surgery

## 2019-03-21 ENCOUNTER — Ambulatory Visit
Admission: RE | Admit: 2019-03-21 | Discharge: 2019-03-21 | Disposition: A | Discharge status: Home / Self Care | Payer: PPO | Attending: Cardiothoracic Surgery | Admitting: Cardiothoracic Surgery

## 2019-03-21 ENCOUNTER — Other Ambulatory Visit: Payer: Self-pay

## 2019-03-21 VITALS — BP 117/80 | HR 73 | Temp 97.7°F | Ht 70.0 in | Wt 160.4 lb

## 2019-03-21 DIAGNOSIS — R911 Solitary pulmonary nodule: Secondary | ICD-10-CM

## 2019-03-21 DIAGNOSIS — C3490 Malignant neoplasm of unspecified part of unspecified bronchus or lung: Secondary | ICD-10-CM

## 2019-03-21 NOTE — Patient Instructions (Addendum)
We will set you up with an appointment with Dr Dahlia Byes.   GENERAL POST-OPERATIVE PATIENT INSTRUCTIONS   FOLLOW-UP:  Please make an appointment with your physician in.  Call your physician immediately if you have any fevers greater than 102.5, drainage from you wound that is not clear or looks infected, persistent bleeding, increasing abdominal pain, problems urinating, or persistent nausea/vomiting.    WOUND CARE INSTRUCTIONS:  Keep a dry clean dressing on the wound if there is drainage. The initial bandage may be removed after 24 hours.  Once the wound has quit draining you may leave it open to air.  If clothing rubs against the wound or causes irritation and the wound is not draining you may cover it with a dry dressing during the daytime.  Try to keep the wound dry and avoid ointments on the wound unless directed to do so.  If the wound becomes bright red and painful or starts to drain infected material that is not clear, please contact your physician immediately.  If the wound is mildly pink and has a thick firm ridge underneath it, this is normal, and is referred to as a healing ridge.  This will resolve over the next 4-6 weeks.  DIET:  You may eat any foods that you can tolerate.  It is a good idea to eat a high fiber diet and take in plenty of fluids to prevent constipation.  If you do become constipated you may want to take a mild laxative or take ducolax tablets on a daily basis until your bowel habits are regular.  Constipation can be very uncomfortable, along with straining, after recent surgery.  ACTIVITY:  You are encouraged to cough and deep breath or use your incentive spirometer if you were given one, every 15-30 minutes when awake.  This will help prevent respiratory complications and low grade fevers post-operatively if you had a general anesthetic.  You may want to hug a pillow when coughing and sneezing to add additional support to the surgical area, if you had abdominal or chest  surgery, which will decrease pain during these times.  You are encouraged to walk and engage in light activity for the next two weeks.  You should not lift more than 20 pounds during this time frame as it could put you at increased risk for complications.  Twenty pounds is roughly equivalent to a plastic bag of groceries.    MEDICATIONS:  Try to take narcotic medications and anti-inflammatory medications, such as tylenol, ibuprofen, naprosyn, etc., with food.  This will minimize stomach upset from the medication.  Should you develop nausea and vomiting from the pain medication, or develop a rash, please discontinue the medication and contact your physician.  You should not drive, make important decisions, or operate machinery when taking narcotic pain medication.  QUESTIONS:  Please feel free to call your physician or the hospital operator if you have any questions, and they will be glad to assist you.

## 2019-03-21 NOTE — Progress Notes (Signed)
  Patient ID: [name], male   DOB: Aug 18, 1945, 74 y.o.   MRN: [id number]  HISTORY: Here for F/U.  Had endoscopy.  To see Dr. Seward Meth in one year.    Vitals:   03/21/19 1149  BP: 117/80  Pulse: 73  Temp: 97.7 F (36.5 C)  SpO2: 97%     EXAM:    Resp: Lungs are clear bilaterally and distant.  No respiratory distress, normal effort. Heart:  Regular without murmurs Abd:  Abdomen is soft, non distended and non tender. No masses are palpable.  There is no rebound and no guarding.  Neurological: Alert and oriented to person, place, and time. Coordination normal.  Skin: Skin is warm and dry. No rash noted. No diaphoretic. No erythema. No pallor.  Psychiatric: Normal mood and affect. Normal behavior. Judgment and thought content normal.    ASSESSMENT: Indpendent review of CXRay is unchanged.    PLAN:   To see Dr. Dahlia Byes for possible hiatal hernia repair.  Return to see me prn.  To see Dr. Burlene Arnt in one year.    [name], MD

## 2019-03-24 ENCOUNTER — Ambulatory Visit: Payer: PPO | Admitting: Surgery

## 2019-03-24 ENCOUNTER — Other Ambulatory Visit: Payer: Self-pay

## 2019-03-24 ENCOUNTER — Encounter: Payer: Self-pay | Admitting: Surgery

## 2019-03-24 VITALS — BP 145/81 | HR 71 | Temp 97.7°F | Ht 70.0 in | Wt 159.2 lb

## 2019-03-24 DIAGNOSIS — E538 Deficiency of other specified B group vitamins: Secondary | ICD-10-CM | POA: Diagnosis not present

## 2019-03-24 DIAGNOSIS — E559 Vitamin D deficiency, unspecified: Secondary | ICD-10-CM | POA: Diagnosis not present

## 2019-03-24 DIAGNOSIS — R251 Tremor, unspecified: Secondary | ICD-10-CM | POA: Diagnosis not present

## 2019-03-24 DIAGNOSIS — R1310 Dysphagia, unspecified: Secondary | ICD-10-CM | POA: Diagnosis not present

## 2019-03-24 DIAGNOSIS — F3012 Manic episode without psychotic symptoms, moderate: Secondary | ICD-10-CM | POA: Diagnosis not present

## 2019-03-24 DIAGNOSIS — R9082 White matter disease, unspecified: Secondary | ICD-10-CM | POA: Diagnosis not present

## 2019-03-24 NOTE — Patient Instructions (Addendum)
Patient is scheduled for a Barium Swallow on March 26, 2019 at [facility]. Patient will arrive at 11:15a and will have procedure at 11:30a. Patient is NOT to have any thing to eat or drink 3 hours prior to procedure. Patient will follow up with Dr.Pabon in three weeks.   Hiatal Hernia  A hiatal hernia occurs when part of the stomach slides above the muscle that separates the abdomen from the chest (diaphragm). A person can be born with a hiatal hernia (congenital), or it may develop over time. In almost all cases of hiatal hernia, only the top part of the stomach pushes through the diaphragm. Many people have a hiatal hernia with no symptoms. The larger the hernia, the more likely it is that you will have symptoms. In some cases, a hiatal hernia allows stomach acid to flow back into the tube that carries food from your mouth to your stomach (esophagus). This may cause heartburn symptoms. Severe heartburn symptoms may mean that you have developed a condition called gastroesophageal reflux disease (GERD). What are the causes? This condition is caused by a weakness in the opening (hiatus) where the esophagus passes through the diaphragm to attach to the upper part of the stomach. A person may be born with a weakness in the hiatus, or a weakness can develop over time. What increases the risk? This condition is more likely to develop in:  Older people. Age is a major risk factor for a hiatal hernia, especially if you are over the age of 18.  Pregnant women.  People who are overweight.  People who have frequent constipation. What are the signs or symptoms? Symptoms of this condition usually develop in the form of GERD symptoms. Symptoms include:  Heartburn.  Belching.  Indigestion.  Trouble swallowing.  Coughing or wheezing.  Sore throat.  Hoarseness.  Chest pain.  Nausea and vomiting. How is this diagnosed? This condition may be diagnosed during testing for GERD.  Tests that may be done include:  X-rays of your stomach or chest.  An upper gastrointestinal (GI) series. This is an X-ray exam of your GI tract that is taken after you swallow a chalky liquid that shows up clearly on the X-ray.  Endoscopy. This is a procedure to look into your stomach using a thin, flexible tube that has a tiny camera and light on the end of it. How is this treated? This condition may be treated by:  Dietary and lifestyle changes to help reduce GERD symptoms.  Medicines. These may include: ? Over-the-counter antacids. ? Medicines that make your stomach empty more quickly. ? Medicines that block the production of stomach acid (H2 blockers). ? Stronger medicines to reduce stomach acid (proton pump inhibitors).  Surgery to repair the hernia, if other treatments are not helping. If you have no symptoms, you may not need treatment. Follow these instructions at home: Lifestyle and activity  Do not use any products that contain nicotine or tobacco, such as cigarettes and e-cigarettes. If you need help quitting, ask your health care provider.  Try to achieve and maintain a healthy body weight.  Avoid putting pressure on your abdomen. Anything that puts pressure on your abdomen increases the amount of acid that may be pushed up into your esophagus. ? Avoid bending over, especially after eating. ? Raise the head of your bed by putting blocks under the legs. This keeps your head and esophagus higher than your stomach. ? Do not wear tight clothing around your chest or  stomach. ? Try not to strain when having a bowel movement, when urinating, or when lifting heavy objects. Eating and drinking  Avoid foods that can worsen GERD symptoms. These may include: ? Fatty foods, like fried foods. ? Citrus fruits, like oranges or lemon. ? Other foods and drinks that contain acid, like orange juice or tomatoes. ? Spicy food. ? Chocolate.  Eat frequent small meals instead of three  large meals a day. This helps prevent your stomach from getting too full. ? Eat slowly. ? Do not lie down right after eating. ? Do not eat 1-2 hours before bed.  Do not drink beverages with caffeine. These include cola, coffee, cocoa, and tea.  Do not drink alcohol. General instructions  Take over-the-counter and prescription medicines only as told by your health care provider.  Keep all follow-up visits as told by your health care provider. This is important. Contact a health care provider if:  Your symptoms are not controlled with medicines or lifestyle changes.  You are having trouble swallowing.  You have coughing or wheezing that will not go away. Get help right away if:  Your pain is getting worse.  Your pain spreads to your arms, neck, jaw, teeth, or back.  You have shortness of breath.  You sweat for no reason.  You feel sick to your stomach (nauseous) or you vomit.  You vomit blood.  You have bright red blood in your stools.  You have black, tarry stools. This information is not intended to replace advice given to you by your health care provider. Make sure you discuss any questions you have with your health care provider. Document Revised: 12/08/2016 Document Reviewed: 07/31/2016 Elsevier Patient Education  [place].

## 2019-03-24 NOTE — Progress Notes (Addendum)
Patient ID: [name], male   DOB: 03/03/1945, 74 y.o.   MRN: [id number]  HPI [name] is a 74 y.o. male seen in consultation at the request of Dr. Genevive Bi for a large hiatal hernia.  He reports that over the last 6 months of so has been having significant swallowing issues.  He is able to eat but prefers some soft food.  More recently an endoscopy was performed by Dr.Wohl that I have personally reviewed the images showing a large hiatal hernia and a stricture was dilated.  He feels much better from the swelling perspective after the stricture was dilated.  He is recovering well from a thoracotomy performed on the right side Dr. Genevive Bi lung mass with incidental squamous cell. He does have a history of COPD but quit smoking about 4 months ago.  HPI  Past Medical History:  Diagnosis Date  . Arthritis    in his fingers  . Bipolar disorder ([place])   . BPH (benign prostatic hyperplasia)   . Depression   . Emphysema   . Episodic confusion    Possible bipolar disorder  . GERD (gastroesophageal reflux disease)   . History of chicken pox   . History of hiatal hernia   . HNP (herniated nucleus pulposus), lumbar   . Hyperlipidemia   . Memory deficit 07/31/2012  . OCD (obsessive compulsive disorder)   . Peripheral neuropathy   . Restless leg syndrome 01/10/2003  . Unspecified psychosis 03/01/2012  . Wears dentures    full upper and lower  . Wears hearing aid in both ears     Past Surgical History:  Procedure Laterality Date  . BACK SURGERY    . COLONOSCOPY    . ESOPHAGOGASTRODUODENOSCOPY    . ESOPHAGOGASTRODUODENOSCOPY (EGD) WITH PROPOFOL [name] 02/28/2019   Procedure: ESOPHAGOGASTRODUODENOSCOPY (EGD) WITH DILATION;  Surgeon: Lucilla Lame, MD;  Location: [place];  Service: Endoscopy;  Laterality: [name];  Priority 3  . FLEXIBLE BRONCHOSCOPY [name] 01/06/2019   Procedure: FLEXIBLE BRONCHOSCOPY;  Surgeon: [name], MD;  Location: [facility];  Service: Thoracic;  Laterality: [name];   . liver excision     for non-cancerous mass  . LUMBAR LAMINECTOMY/DECOMPRESSION MICRODISCECTOMY Left 06/22/2017   Procedure: Extraforaminal Microdiscectomy  - L4-L5 - left;  Surgeon: Eustace Moore, MD;  Location: [place];  Service: Neurosurgery;  Laterality: Left;  . LUNG SURGERY  2005   Lipoma Removal  . MAXIMUM ACCESS (MAS) TRANSFORAMINAL LUMBAR INTERBODY FUSION (TLIF) 1 LEVEL [name] 02/18/2018   Procedure: Transforaminal Lumbar Interbody Fusion - Lumbar Four - Lumbar Five;  Surgeon: Eustace Moore, MD;  Location: [place];  Service: Neurosurgery;  Laterality: [name];  Transforaminal Lumbar Interbody Fusion - Lumbar Four - Lumbar Five  . THORACOTOMY Right 01/06/2019   Procedure: THORACOTOMY MAJOR;  Surgeon: [name], MD;  Location: [facility];  Service: Thoracic;  Laterality: Right;  [name] VIDEO ASSISTED THORACOSCOPY (VATS)/WEDGE RESECTION Right 01/06/2019   Procedure: VIDEO ASSISTED THORACOSCOPY (VATS)/WEDGE RESECTION-RUL and RLL-lung resection;  Surgeon: [name], MD;  Location: [facility];  Service: Thoracic;  Laterality: Right;    Family History  Problem Relation Age of Onset  . Heart disease Mother   . Cancer Father        lung  . Heart disease Father   . Diabetes Sister   . Heart disease Sister   . Seizures Paternal Grandfather     Social History Social History   Tobacco Use  . Smoking status: Former Smoker  Packs/day: 0.75    Years: 50.00    Pack years: 37.50    Types: Cigarettes    Quit date: 11/26/2018    Years since quitting: 0.3  . Smokeless tobacco: Never Used  Substance Use Topics  . Alcohol use: No  . Drug use: No    Allergies  Allergen Reactions  . Ropinirole Hcl Other (See Comments)    Confusion, agiitation, disorientation, Hallucinations  . Tramadol     lethargy    Current Outpatient Medications  Medication Sig Dispense Refill  . aspirin 81 MG tablet Take 1 tablet (81 mg total) by mouth daily.    . cholecalciferol (VITAMIN D3) 25 MCG (1000 UT) tablet  Take 1,000 Units by mouth daily.    . Coenzyme Q10 (COQ10) 100 MG CAPS Take 100 mg by mouth daily.     . divalproex (DEPAKOTE ER) 500 MG 24 hr tablet Take 1,000 mg by mouth at bedtime.     . Omega-3 Fatty Acids (FISH OIL PO) Take 1 capsule by mouth daily.    [name] omeprazole (PRILOSEC) 40 MG capsule Take 1 capsule (40 mg total) by mouth daily. 90 capsule 2  . simvastatin (ZOCOR) 40 MG tablet TAKE 1 TABLET (40 MG TOTAL) BY MOUTH DAILY AT 6 PM. 90 tablet 4  . vitamin B-12 (CYANOCOBALAMIN) 1000 MCG tablet Take 1,000 mcg by mouth daily.    . ziprasidone (GEODON) 40 MG capsule Take 40 mg by mouth daily with supper.      No current facility-administered medications for this visit.     Review of Systems Full ROS  was asked and was negative except for the information on the HPI  Physical Exam Blood pressure (!) 145/81, pulse 71, temperature 97.7 F (36.5 C), temperature source Temporal, height _0  (1.778 m), weight 159 lb 3.2 oz (72.2 kg), SpO2 98 %. CONSTITUTIONAL: NAD EYES: Pupils are equal, round, and reactive to light, Sclera are non-icteric. EARS, NOSE, MOUTH AND THROAT: Wearing a mask Hearing is intact to voice. LYMPH NODES:  Lymph nodes in the neck are normal. RESPIRATORY:  Lungs are clear. There is normal respiratory effort, with equal breath sounds bilaterally, and without pathologic use of accessory muscles. Right thoracotomy scar CARDIOVASCULAR: Heart is regular without murmurs, gallops, or rubs. GI: The abdomen is soft, nontender, and nondistended. There are no palpable masses. There is no hepatosplenomegaly. There are normal bowel sounds in all quadrants. GU: Rectal deferred.   MUSCULOSKELETAL: Normal muscle strength and tone. No cyanosis or edema.   SKIN: Turgor is good and there are no pathologic skin lesions or ulcers. NEUROLOGIC: Motor and sensation is grossly normal. Cranial nerves are grossly intact. PSYCH:  Oriented to person, place and time. Affect is normal.  Data  Reviewed  I have personally reviewed the patient's imaging, laboratory findings and medical records.    Assessment/Plan 74 year old male with symptomatic large hiatal hernia with at least a third of his stomach within the mediastinum.  Discussed with the patient in detail and he already has completed an endoscopic evaluation with dilation.  He did have swallow evaluation for aspiration in December but I will like to have a dedicated barium swallow to review the anatomy of the distal esophagus.  I Do think that he will be a good candidate for robotic approach.  We will first after complete appropriate esophageal evaluation.    Caroleen Hamman, MD FACS General Surgeon 03/24/2019, 11:52 AM

## 2019-03-26 ENCOUNTER — Ambulatory Visit
Admission: RE | Admit: 2019-03-26 | Discharge: 2019-03-26 | Disposition: A | Discharge status: Home / Self Care | Payer: PPO | Source: Ambulatory Visit | Attending: Surgery | Admitting: Surgery

## 2019-03-26 ENCOUNTER — Other Ambulatory Visit: Payer: Self-pay

## 2019-03-26 ENCOUNTER — Other Ambulatory Visit: Payer: Self-pay | Admitting: Surgery

## 2019-03-26 DIAGNOSIS — R1310 Dysphagia, unspecified: Secondary | ICD-10-CM

## 2019-03-26 DIAGNOSIS — K222 Esophageal obstruction: Secondary | ICD-10-CM | POA: Diagnosis not present

## 2019-03-26 DIAGNOSIS — K449 Diaphragmatic hernia without obstruction or gangrene: Secondary | ICD-10-CM | POA: Diagnosis not present

## 2019-03-27 ENCOUNTER — Telehealth: Payer: Self-pay

## 2019-03-27 NOTE — Telephone Encounter (Signed)
-----   Message from Jules Husbands, MD sent at 03/27/2019 10:25 AM EDT ----- Please let him know that swallow showed known hiatal hernia, we will talk some more in the next f/u appt, no thing alarming ----- Message ----- From: Interface, Rad Results In Sent: 03/26/2019   3:24 PM EDT To: Jules Husbands, MD

## 2019-03-27 NOTE — Telephone Encounter (Signed)
Spoke to patient's sister Letta Median and notified her of patient's recent Barium Swallow results per Dr.Pabon. Letta Median verbalized understanding and has no further questions.

## 2019-04-14 ENCOUNTER — Telehealth: Payer: Self-pay | Admitting: Emergency Medicine

## 2019-04-14 ENCOUNTER — Ambulatory Visit (INDEPENDENT_AMBULATORY_CARE_PROVIDER_SITE_OTHER): Payer: PPO | Admitting: Surgery

## 2019-04-14 ENCOUNTER — Other Ambulatory Visit: Payer: Self-pay

## 2019-04-14 ENCOUNTER — Encounter: Payer: Self-pay | Admitting: Surgery

## 2019-04-14 VITALS — BP 110/70 | HR 75 | Temp 97.7°F | Resp 12 | Wt 156.6 lb

## 2019-04-14 DIAGNOSIS — K449 Diaphragmatic hernia without obstruction or gangrene: Secondary | ICD-10-CM

## 2019-04-14 DIAGNOSIS — H353211 Exudative age-related macular degeneration, right eye, with active choroidal neovascularization: Secondary | ICD-10-CM | POA: Diagnosis not present

## 2019-04-14 NOTE — Telephone Encounter (Signed)
Medical Clearance sent to Dr Lelon Huh [phone]).

## 2019-04-14 NOTE — Patient Instructions (Addendum)
Our surgery will contact you to schedule your surgery. Please have the BLUE SHEET available when she calls you.   We will need to obtain a Medical Clearance from Dr Caryn Section. So he may need to see you prior to surgery but they will contact you if they do.   Call the office if you have any questions or concerns.   Hiatal Hernia  A hiatal hernia occurs when part of the stomach slides above the muscle that separates the abdomen from the chest (diaphragm). A person can be born with a hiatal hernia (congenital), or it may develop over time. In almost all cases of hiatal hernia, only the top part of the stomach pushes through the diaphragm. Many people have a hiatal hernia with no symptoms. The larger the hernia, the more likely it is that you will have symptoms. In some cases, a hiatal hernia allows stomach acid to flow back into the tube that carries food from your mouth to your stomach (esophagus). This may cause heartburn symptoms. Severe heartburn symptoms may mean that you have developed a condition called gastroesophageal reflux disease (GERD). What are the causes? This condition is caused by a weakness in the opening (hiatus) where the esophagus passes through the diaphragm to attach to the upper part of the stomach. A person may be born with a weakness in the hiatus, or a weakness can develop over time. What increases the risk? This condition is more likely to develop in:  Older people. Age is a major risk factor for a hiatal hernia, especially if you are over the age of 19.  Pregnant women.  People who are overweight.  People who have frequent constipation. What are the signs or symptoms? Symptoms of this condition usually develop in the form of GERD symptoms. Symptoms include:  Heartburn.  Belching.  Indigestion.  Trouble swallowing.  Coughing or wheezing.  Sore throat.  Hoarseness.  Chest pain.  Nausea and vomiting. How is this diagnosed? This condition may be diagnosed  during testing for GERD. Tests that may be done include:  X-rays of your stomach or chest.  An upper gastrointestinal (GI) series. This is an X-ray exam of your GI tract that is taken after you swallow a chalky liquid that shows up clearly on the X-ray.  Endoscopy. This is a procedure to look into your stomach using a thin, flexible tube that has a tiny camera and light on the end of it. How is this treated? This condition may be treated by:  Dietary and lifestyle changes to help reduce GERD symptoms.  Medicines. These may include: ? Over-the-counter antacids. ? Medicines that make your stomach empty more quickly. ? Medicines that block the production of stomach acid (H2 blockers). ? Stronger medicines to reduce stomach acid (proton pump inhibitors).  Surgery to repair the hernia, if other treatments are not helping. If you have no symptoms, you may not need treatment. Follow these instructions at home: Lifestyle and activity  Do not use any products that contain nicotine or tobacco, such as cigarettes and e-cigarettes. If you need help quitting, ask your health care provider.  Try to achieve and maintain a healthy body weight.  Avoid putting pressure on your abdomen. Anything that puts pressure on your abdomen increases the amount of acid that may be pushed up into your esophagus. ? Avoid bending over, especially after eating. ? Raise the head of your bed by putting blocks under the legs. This keeps your head and esophagus higher than your stomach. ?  Do not wear tight clothing around your chest or stomach. ? Try not to strain when having a bowel movement, when urinating, or when lifting heavy objects. Eating and drinking  Avoid foods that can worsen GERD symptoms. These may include: ? Fatty foods, like fried foods. ? Citrus fruits, like oranges or lemon. ? Other foods and drinks that contain acid, like orange juice or tomatoes. ? Spicy food. ? Chocolate.  Eat frequent small  meals instead of three large meals a day. This helps prevent your stomach from getting too full. ? Eat slowly. ? Do not lie down right after eating. ? Do not eat 1-2 hours before bed.  Do not drink beverages with caffeine. These include cola, coffee, cocoa, and tea.  Do not drink alcohol. General instructions  Take over-the-counter and prescription medicines only as told by your health care provider.  Keep all follow-up visits as told by your health care provider. This is important. Contact a health care provider if:  Your symptoms are not controlled with medicines or lifestyle changes.  You are having trouble swallowing.  You have coughing or wheezing that will not go away. Get help right away if:  Your pain is getting worse.  Your pain spreads to your arms, neck, jaw, teeth, or back.  You have shortness of breath.  You sweat for no reason.  You feel sick to your stomach (nauseous) or you vomit.  You vomit blood.  You have bright red blood in your stools.  You have black, tarry stools. This information is not intended to replace advice given to you by your health care provider. Make sure you discuss any questions you have with your health care provider. Document Revised: 12/08/2016 Document Reviewed: 07/31/2016 Elsevier Patient Education  [place].

## 2019-04-14 NOTE — Progress Notes (Signed)
Outpatient Surgical Follow Up  04/14/2019  [name] is an 74 y.o. male.   Chief Complaint  Patient presents with  . Follow-up    Hiatal Hernia - Barium Swallow 03/26/19 - Dr Dahlia Byes    HPI: 74 year old male with symptomatic hiatal hernia develop esophageal stricture status post dilation.  I have personally reviewed the most recent swallow study showing evidence of a narrowing in the distal esophagus large hiatal hernia..  The patient reports that he has no dysphagia.  No fevers no chills no chest pain.  He does have persistent reflux. Cussed the findings with our endoscopist Dr.Wohl performed the recent dilation and given that he is asymptomatic and is not showing any signs of dysphagia he does not necessarily recommend another dilation before definitive operative intervention is performed.  Past Medical History:  Diagnosis Date  . Arthritis    in his fingers  . Bipolar disorder ([place])   . BPH (benign prostatic hyperplasia)   . Depression   . Emphysema   . Episodic confusion    Possible bipolar disorder  . GERD (gastroesophageal reflux disease)   . History of chicken pox   . History of hiatal hernia   . HNP (herniated nucleus pulposus), lumbar   . Hyperlipidemia   . Memory deficit 07/31/2012  . OCD (obsessive compulsive disorder)   . Peripheral neuropathy   . Restless leg syndrome 01/10/2003  . Unspecified psychosis 03/01/2012  . Wears dentures    full upper and lower  . Wears hearing aid in both ears     Past Surgical History:  Procedure Laterality Date  . BACK SURGERY    . COLONOSCOPY    . ESOPHAGOGASTRODUODENOSCOPY    . ESOPHAGOGASTRODUODENOSCOPY (EGD) WITH PROPOFOL [name] 02/28/2019   Procedure: ESOPHAGOGASTRODUODENOSCOPY (EGD) WITH DILATION;  Surgeon: Lucilla Lame, MD;  Location: [place];  Service: Endoscopy;  Laterality: [name];  Priority 3  . FLEXIBLE BRONCHOSCOPY [name] 01/06/2019   Procedure: FLEXIBLE BRONCHOSCOPY;  Surgeon: [name], MD;  Location:  [facility];  Service: Thoracic;  Laterality: [name];  . liver excision     for non-cancerous mass  . LUMBAR LAMINECTOMY/DECOMPRESSION MICRODISCECTOMY Left 06/22/2017   Procedure: Extraforaminal Microdiscectomy  - L4-L5 - left;  Surgeon: Eustace Moore, MD;  Location: [place];  Service: Neurosurgery;  Laterality: Left;  . LUNG SURGERY  2005   Lipoma Removal  . MAXIMUM ACCESS (MAS) TRANSFORAMINAL LUMBAR INTERBODY FUSION (TLIF) 1 LEVEL [name] 02/18/2018   Procedure: Transforaminal Lumbar Interbody Fusion - Lumbar Four - Lumbar Five;  Surgeon: Eustace Moore, MD;  Location: [place];  Service: Neurosurgery;  Laterality: [name];  Transforaminal Lumbar Interbody Fusion - Lumbar Four - Lumbar Five  . THORACOTOMY Right 01/06/2019   Procedure: THORACOTOMY MAJOR;  Surgeon: [name], MD;  Location: [facility];  Service: Thoracic;  Laterality: Right;  [name] VIDEO ASSISTED THORACOSCOPY (VATS)/WEDGE RESECTION Right 01/06/2019   Procedure: VIDEO ASSISTED THORACOSCOPY (VATS)/WEDGE RESECTION-RUL and RLL-lung resection;  Surgeon: [name], MD;  Location: [facility];  Service: Thoracic;  Laterality: Right;    Family History  Problem Relation Age of Onset  . Heart disease Mother   . Cancer Father        lung  . Heart disease Father   . Diabetes Sister   . Heart disease Sister   . Seizures Paternal Grandfather     Social History:  reports that he quit smoking about 4 months ago. His smoking use included cigarettes. He has a 37.50 pack-year smoking history. He  has never used smokeless tobacco. He reports that he does not drink alcohol or use drugs.  Allergies:  Allergies  Allergen Reactions  . Ropinirole Hcl Other (See Comments)    Confusion, agiitation, disorientation, Hallucinations  . Tramadol     lethargy    Medications reviewed.    ROS Full ROS performed and is otherwise negative other than what is stated in HPI   BP 110/70   Pulse 75   Temp 97.7 F (36.5 C)   Resp 12   Wt 156 lb 9.6 oz (71 kg)    SpO2 97%   BMI 22.47 kg/m   Physical Exam Vitals and nursing note reviewed.  Constitutional:      General: He is not in acute distress.    Appearance: Normal appearance. He is normal weight. He is not ill-appearing.  Cardiovascular:     Rate and Rhythm: Normal rate and regular rhythm.  Pulmonary:     Effort: Pulmonary effort is normal. No respiratory distress.     Breath sounds: Normal breath sounds. No stridor.  Abdominal:     General: Abdomen is flat. There is no distension.     Palpations: There is no mass.     Tenderness: There is no abdominal tenderness. There is no rebound.     Hernia: No hernia is present.  Musculoskeletal:     Cervical back: Normal range of motion and neck supple. No rigidity or tenderness.  Skin:    General: Skin is warm and dry.     Capillary Refill: Capillary refill takes less than 2 seconds.  Neurological:     General: No focal deficit present.     Mental Status: He is alert and oriented to person, place, and time.  Psychiatric:        Mood and Affect: Mood normal.        Behavior: Behavior normal.        Thought Content: Thought content normal.        Judgment: Judgment normal.    Assessment/Plan: 74 year old male with significant symptomatic hiatal hernia with reflux of stricture status post dilation.  Clinically much improved and with out significant dysphagia. Discussed extensively with the patient about options and I do think he is a good candidate for robotic approach.  Since his clinical symptoms have improved from the dysphagia perspective I do not necessarily think that we need to do another dilation.  I had also discussed with Dr.Wohl extensively about the situation and he is in agreement with me.  We will monitor for robotic repair of hiatal hernia with fundoplication.  Procedure discussed with the patient in detail.  Risk, benefits and possible complications including but not limited to: Bleeding, infection, esophageal injury,  reintervention, conversion to open and need for potential further dilations down the line.  He also understands that there will be a change in the diet for the first few weeks. Greater than 50% of the 40 minutes  visit was spent in counseling/coordination of care   Caroleen Hamman, MD [place] Surgeon

## 2019-04-14 NOTE — H&P (View-Only) (Signed)
Outpatient Surgical Follow Up  04/14/2019  [name] is an 74 y.o. male.   Chief Complaint  Patient presents with  . Follow-up    Hiatal Hernia - Barium Swallow 03/26/19 - Dr Dahlia Byes    HPI: 74 year old male with symptomatic hiatal hernia develop esophageal stricture status post dilation.  I have personally reviewed the most recent swallow study showing evidence of a narrowing in the distal esophagus large hiatal hernia..  The patient reports that he has no dysphagia.  No fevers no chills no chest pain.  He does have persistent reflux. Cussed the findings with our endoscopist Dr.Wohl performed the recent dilation and given that he is asymptomatic and is not showing any signs of dysphagia he does not necessarily recommend another dilation before definitive operative intervention is performed.  Past Medical History:  Diagnosis Date  . Arthritis    in his fingers  . Bipolar disorder ([place]ann[place])   . BPH (benign prostatic hyperplasia)   . Depression   . Emphysema   . Episodic confusion    Possible bipolar disorder  . GERD (gastroesophageal reflux disease)   . History of chicken pox   . History of hiatal hernia   . HNP (herniated nucleus pulposus), lumbar   . Hyperlipidemia   . Memory deficit 07/31/2012  . OCD (obsessive compulsive disorder)   . Peripheral neuropathy   . Restless leg syndrome 01/10/2003  . Unspecified psychosis 03/01/2012  . Wears dentures    full upper and lower  . Wears hearing aid in both ears     Past Surgical History:  Procedure Laterality Date  . BACK SURGERY    . COLONOSCOPY    . ESOPHAGOGASTRODUODENOSCOPY    . ESOPHAGOGASTRODUODENOSCOPY (EGD) WITH PROPOFOL [name] 02/28/2019   Procedure: ESOPHAGOGASTRODUODENOSCOPY (EGD) WITH DILATION;  Surgeon: Lucilla Lame, MD;  Location: [facility];  Service: Endoscopy;  Laterality: [name];  Priority 3  . FLEXIBLE BRONCHOSCOPY [name] 01/06/2019   Procedure: FLEXIBLE BRONCHOSCOPY;  Surgeon: [name], MD;  Location:  [facility];  Service: Thoracic;  Laterality: [name];  . liver excision     for non-cancerous mass  . LUMBAR LAMINECTOMY/DECOMPRESSION MICRODISCECTOMY Left 06/22/2017   Procedure: Extraforaminal Microdiscectomy  - L4-L5 - left;  Surgeon: Eustace Moore, MD;  Location: [facility];  Service: Neurosurgery;  Laterality: Left;  . LUNG SURGERY  2005   Lipoma Removal  . MAXIMUM ACCESS (MAS) TRANSFORAMINAL LUMBAR INTERBODY FUSION (TLIF) 1 LEVEL [name] 02/18/2018   Procedure: Transforaminal Lumbar Interbody Fusion - Lumbar Four - Lumbar Five;  Surgeon: Eustace Moore, MD;  Location: [facility];  Service: Neurosurgery;  Laterality: [name];  Transforaminal Lumbar Interbody Fusion - Lumbar Four - Lumbar Five  . THORACOTOMY Right 01/06/2019   Procedure: THORACOTOMY MAJOR;  Surgeon: [name], MD;  Location: [facility];  Service: Thoracic;  Laterality: Right;  [name] VIDEO ASSISTED THORACOSCOPY (VATS)/WEDGE RESECTION Right 01/06/2019   Procedure: VIDEO ASSISTED THORACOSCOPY (VATS)/WEDGE RESECTION-RUL and RLL-lung resection;  Surgeon: [name], MD;  Location: [facility];  Service: Thoracic;  Laterality: Right;    Family History  Problem Relation Age of Onset  . Heart disease Mother   . Cancer Father        lung  . Heart disease Father   . Diabetes Sister   . Heart disease Sister   . Seizures Paternal Grandfather     Social History:  reports that he quit smoking about 4 months ago. His smoking use included cigarettes. He has a 37.50 pack-year smoking history. He  has never used smokeless tobacco. He reports that he does not drink alcohol or use drugs.  Allergies:  Allergies  Allergen Reactions  . Ropinirole Hcl Other (See Comments)    Confusion, agiitation, disorientation, Hallucinations  . Tramadol     lethargy    Medications reviewed.    ROS Full ROS performed and is otherwise negative other than what is stated in HPI   BP 110/70   Pulse 75   Temp 97.7 F (36.5 C)   Resp 12   Wt 156 lb 9.6 oz (71 kg)    SpO2 97%   BMI 22.47 kg/m   Physical Exam Vitals and nursing note reviewed.  Constitutional:      General: He is not in acute distress.    Appearance: Normal appearance. He is normal weight. He is not ill-appearing.  Cardiovascular:     Rate and Rhythm: Normal rate and regular rhythm.  Pulmonary:     Effort: Pulmonary effort is normal. No respiratory distress.     Breath sounds: Normal breath sounds. No stridor.  Abdominal:     General: Abdomen is flat. There is no distension.     Palpations: There is no mass.     Tenderness: There is no abdominal tenderness. There is no rebound.     Hernia: No hernia is present.  Musculoskeletal:     Cervical back: Normal range of motion and neck supple. No rigidity or tenderness.  Skin:    General: Skin is warm and dry.     Capillary Refill: Capillary refill takes less than 2 seconds.  Neurological:     General: No focal deficit present.     Mental Status: He is alert and oriented to person, place, and time.  Psychiatric:        Mood and Affect: Mood normal.        Behavior: Behavior normal.        Thought Content: Thought content normal.        Judgment: Judgment normal.    Assessment/Plan: 74 year old male with significant symptomatic hiatal hernia with reflux of stricture status post dilation.  Clinically much improved and with out significant dysphagia. Discussed extensively with the patient about options and I do think he is a good candidate for robotic approach.  Since his clinical symptoms have improved from the dysphagia perspective I do not necessarily think that we need to do another dilation.  I had also discussed with Dr.Wohl extensively about the situation and he is in agreement with me.  We will monitor for robotic repair of hiatal hernia with fundoplication.  Procedure discussed with the patient in detail.  Risk, benefits and possible complications including but not limited to: Bleeding, infection, esophageal injury,  reintervention, conversion to open and need for potential further dilations down the line.  He also understands that there will be a change in the diet for the first few weeks. Greater than 50% of the 40 minutes  visit was spent in counseling/coordination of care   Caroleen Hamman, MD [facility] Surgeon

## 2019-04-17 ENCOUNTER — Telehealth: Payer: Self-pay | Admitting: Surgery

## 2019-04-17 NOTE — Telephone Encounter (Signed)
Spoke with patient's sister, [name], she has been advised of Pre-Admission date/time, COVID Testing date and Surgery date.  Surgery Date: 05/06/19 Preadmission Testing Date: 04/25/19 (phone 1p-5p) Covid Testing Date: 05/02/19 - patient advised to go to the [facility] between 8a-1p   Patient's sister, [name] has been made aware to call [phone], between 1-3:00pm the day before surgery, to find out what time to arrive for surgery.

## 2019-04-20 ENCOUNTER — Telehealth: Payer: Self-pay | Admitting: Family Medicine

## 2019-04-20 DIAGNOSIS — Z01810 Encounter for preprocedural cardiovascular examination: Secondary | ICD-10-CM

## 2019-04-20 DIAGNOSIS — I2510 Atherosclerotic heart disease of native coronary artery without angina pectoris: Secondary | ICD-10-CM

## 2019-04-20 NOTE — Telephone Encounter (Signed)
Received pre-op evaluation request from Dr. Dahlia Byes. Patient needs referral to cardiology for cardiac pre-op clearance, and needs to make an appointment for general medical clearance.

## 2019-04-21 NOTE — Telephone Encounter (Signed)
LMTCB, okay for PEC to advise patient and schedule surgical clearance appointment.

## 2019-04-24 ENCOUNTER — Telehealth: Payer: Self-pay

## 2019-04-24 ENCOUNTER — Other Ambulatory Visit: Payer: Self-pay | Admitting: Emergency Medicine

## 2019-04-24 ENCOUNTER — Telehealth: Payer: Self-pay | Admitting: Emergency Medicine

## 2019-04-24 DIAGNOSIS — K449 Diaphragmatic hernia without obstruction or gangrene: Secondary | ICD-10-CM

## 2019-04-24 NOTE — Telephone Encounter (Signed)
Cardiac Clearance form faxed to Dr Saralyn Pilar at this time.   F: [phone] P: [phone]

## 2019-04-24 NOTE — Telephone Encounter (Signed)
Per Dr Sabino Snipes office, pt needs. Cardiac Clearance.   Cardiac clearance referral has been made and sent to Dr Isaias Cowman at this time. Fax [phone]. Last encounter note, demographics and insurance card sent.   Appt has been made to see Dr Sheppard Coil tomorrow 04/25/19 at [facility] at [facility]. Pt is to bring insurance card, ID, and list of meds.   Attempted to contact pt with no success, have tried calling both sisters on chart Massie Maroon and Lelon Frohlich with no success. I have left vm to contact the office back.

## 2019-04-24 NOTE — Telephone Encounter (Signed)
I wanted to let you know that Dr Caryn Section said this patient will need to have cardiac clearance.  We have been trying to get in touch with him but have been unsuccessful.  We will continue to make attempts.  Thank you Althea Charon on behalf of Dr Caryn Section and his office team   Copied from [place] [phone]. Topic: General - Other >> Apr 24, 2019 11:20 AM Alanda Slim E wrote: Reason for CRM:  medical clearance faxed on 4.5.21 from [facility] / surgery is schedule for 4.27.21/ please advise if this was received and needs to be faxed back

## 2019-04-24 NOTE — Telephone Encounter (Signed)
Call made to patient without being able to get through.  Patient will need to be seen by Cariology for cardiac clearance.

## 2019-04-25 ENCOUNTER — Encounter
Admission: RE | Admit: 2019-04-25 | Discharge: 2019-04-25 | Disposition: A | Discharge status: Home / Self Care | Payer: PPO | Source: Ambulatory Visit | Attending: Surgery | Admitting: Surgery

## 2019-04-25 ENCOUNTER — Telehealth: Payer: Self-pay | Admitting: *Deleted

## 2019-04-25 ENCOUNTER — Telehealth: Payer: Self-pay | Admitting: Family Medicine

## 2019-04-25 ENCOUNTER — Telehealth: Payer: Self-pay

## 2019-04-25 ENCOUNTER — Other Ambulatory Visit: Payer: Self-pay

## 2019-04-25 NOTE — Telephone Encounter (Signed)
Unable to contact pt nor sisters to discuss about pt appt today. Left vm to call the office as soon as possible.

## 2019-04-25 NOTE — Patient Instructions (Signed)
Your COVID test is on Friday 05/02/2019. DRIVE UP in front of the [facility] any time from 8:00 am to 1:00 pm.    Your surgery is scheduled on: Tuesday 05/06/2019 Go to Same Day Surgery 2nd floor [facility] Entrance-take elevator to 2nd floor. Check in with Surgery Information.) To find out your arrival time, call [phone] 1:00-3:00 PM on Monday 05/05/2019  Remember: Instructions that are not followed completely may result in serious medical risk, up to and including death, or upon the discretion of your surgeon and anesthesiologist your surgery may need to be rescheduled.   __x__ 1. Do not eat food (including mints, candies, chewing gum) after midnight the night before your procedure. You may drink water up to 2 hours before you are scheduled to arrive at the hospital for your procedure.   __x__ 2. No alcohol or smoking for 24 hours before or after surgery.  __x__ 3. Notify your doctor if there is any change in your medical condition (cold, fever, infections).  __x__ 4. On the morning of surgery, perform oral hygiene.  You may rinse your mouth with mouthwash if you wish.  Do not swallow any toothpaste or mouthwash.  Please read over the attached fact sheets: [facility] Preparing for Surgery  __x__ Use CHG Soap (provided in bag) as directed on instruction sheet.  Do not wear jewelry, lotions, powders, deodorant or perfumes on the day of surgery. Do not shave below your neck line 48 hours prior to surgery.  Do not bring valuables to the hospital.  [facility] is not responsible for any belongings or valuables.   Eyeglasses, hearing aids and dentures will need to be removed before you go into surgery.  Bring a plastic case for your hearing aids and eyeglasses to the hospital on the day of surgery. For patients discharged on the day of surgery, you will NOT be permitted to drive yourself home.  You must have a responsible adult with you for 24 hours after  surgery.  __x__ Take these medicines on the morning of surgery with a SMALL SIP OF WATER:  1. Omeprazole (Prilosec)- take in addition to your evening dose  __x__ Follow recommendations from Cardiologist, Pulmonologist or PCP regarding stopping Aspirin, Coumadin, Plavix, Eliquis, Effient, Pradaxa, and Pletal.  __x__ STOP TODAY (04/25/2019): Anti-inflammatories such as ASPIRIN, Advil, Ibuprofen, Motrin, Aleve, Naproxen, Naprosyn, BC/Goodies powders or aspirin- containing products.    __x__ STOP TODAY (04/25/2019): Over the counter supplements (OMEGA-3/ FISH OIL, COQ10).  __x__ CONTINUE your other medicines as directed if not mentioned above.

## 2019-04-25 NOTE — Telephone Encounter (Signed)
Attempting to contact the patient to let know of Cardiology appointment with Dr Saralyn Pilar at [facility] today at 11:00 am. Have made numerous attempts to contact him at home, cell, his sister Letta Median and Ervin Knack. No answers at any numbers. I have left a detailed message about this appointment at each of these numbers.

## 2019-04-25 NOTE — Telephone Encounter (Signed)
Patients sister Letta Median called and stated that she will not be taking him to any Cardiology appointments for pay money for doctors to make money, She stated that if he has to have a Cardiology appointment before surgery then he will not be having surgery. She also stated that he didn't have to have a cardiology appointment when he had surgery with Dr Genevive Bi in December.

## 2019-04-25 NOTE — Telephone Encounter (Signed)
Patient's wife is calling to report that the patient is not going to the cardiologist on 05/06/19. She is not going to pay to continue to make "doctors rich"  "If Dr. Dahlia Byes does not know the that patient is  ok. Wife states that it will be on Dr. Caryn Section."  Cb- [phone]

## 2019-04-28 ENCOUNTER — Telehealth: Payer: Self-pay

## 2019-04-28 NOTE — Telephone Encounter (Signed)
[name], with [facility], called in stating they would like to know the reason as to why PCP is requesting a cardiac clearance. Please advise as they state this is urgent and would like a call back at [phone].

## 2019-04-28 NOTE — Telephone Encounter (Signed)
because patient is 51 with known cerebrovascular disease, aortic atherosclerosis, coronary atherosclerosis and multiple family members with heart disease.

## 2019-04-28 NOTE — Telephone Encounter (Signed)
Spoke with Caryl Pina with Pre-Admit testing and she notified me that per Anesthesiologist. Patient would have to have the Medical and Cardiac clearance prior to his surgery. Caryl Pina suggested for future references that if patients are having surgery to not send clearance(s) until patient comes in for Pre-Admit testing and allow the Anesthesiologist determine whether or not the patient needs any clearance(s) completed.   Spoke with Cardiology department and they informed me that patient was scheduled for an New Patient appointment Friday, but did not show for the appointment.   Spoke with Ebony Hail at Dr.Fisher's office and was notified that patient's wife called Friday and requested information, as to why the patient needed any clearances as patient had a major surgery back in December without any requested clearance(s). Ebony Hail informed me she would send a note to Dr.Fisher's team and someone would give our office a call back at their earliest convenience.

## 2019-04-28 NOTE — Telephone Encounter (Signed)
Copied from [place] [phone]. Topic: General - Call Back - No Documentation >> Apr 28, 2019  3:22 PM Erick Blinks wrote: Reason for CRM: Destiny called from [facility] to speak with Philipp Deputy, please advise  Best contact: [phone]

## 2019-04-28 NOTE — Telephone Encounter (Signed)
I called and spoke with Destiny and advised her as below. Destiny states that the patient informed her that he had a major surgery back in December that did not require any clearance. Destiny says that patient is asking why this surgery required a clearance.

## 2019-04-29 NOTE — Telephone Encounter (Signed)
Destiny called again advising me that the Cardiothoracic surgeon Dr. Genevive Bi is going to be assisting Dr. Perrin Maltese with patient's surgery that is scheduled next week. Destiny says that Dr. Genevive Bi was the doctor that performed the surgery on the patient back in December without a medical clearance. Destiny is asking if Dr. Caryn Section would sign the medical clearance now since Dr. Faith Rogue, who is the cardiothoracic surgeon that performed the last surgery in December, will be assisting Dr. Perrin Maltese with next weeks surgery.

## 2019-04-30 ENCOUNTER — Telehealth: Payer: Self-pay | Admitting: Emergency Medicine

## 2019-04-30 ENCOUNTER — Telehealth: Payer: Self-pay

## 2019-04-30 NOTE — Telephone Encounter (Signed)
Spoke with patient's sister Letta Median to inform that patient will proceed with surgery scheduled on April 27th with Dr.Pabon assisted by Dr.Oaks. Dr.Pabon spoke with Anesthesiologist to get approval of the Medical Clearance. Medical Clearance received today 04/30/19 for Patient optimized at [facility] for surgery.

## 2019-04-30 NOTE — Pre-Procedure Instructions (Signed)
Dr Corlis Leak office asking since medical clearance was received from Dr Caryn Section PCP (this was obtained per Dr Dahlia Byes, not anesthesia) does pt really need to be cleared by cardiology. I called Dr Kayleen Memos and informed him of this...that Pabon's office is the one that initiated medical clearance and dr Caryn Section did clear pt medically at low risk but did mention in epic that pt needed cardiac clearance. Dr Kayleen Memos said that if PCP wants pt to get cardiac clearance, then pt will have to get cleared by cardiology prior to his surgery. Dr Barb Merino office notified of this

## 2019-04-30 NOTE — Pre-Procedure Instructions (Signed)
Birdie Sons, MD  Physician  Specialty:  Family Medicine  Telephone Encounter     Signed  Encounter Date:  04/20/2019          Signed         Show:Clear all [x] Manual[] Template[] Copied  Added by: [x] Birdie Sons, MD  [] Hover for details Received pre-op evaluation request from Dr. Dahlia Byes. Patient needs referral to cardiology for cardiac pre-op clearance, and needs to make an appointment for general medical clearance

## 2019-04-30 NOTE — Telephone Encounter (Signed)
Medical Clearance received from Dr Caryn Section.   "Patient is optimized for surgery from non-cardiac standpoint at low risk. Patient not seen or examined by me since 11-13-2018 to the best of my knowledge he is at low risk for non-cardiac complications based on review of M.R."

## 2019-05-01 NOTE — Pre-Procedure Instructions (Signed)
Called and spoke to Destiny about which anesthesia doc dr pabon spoke with yesterday because it is very confusing seeing my note where dr Kayleen Memos said pt had to have this cardiac clearance and then a note from dr pabons office saying now that dr pabon spoke with anesthesia yesterday and they gave the ok to proceed without cardiac clearance.I told Destiny that Dr Dahlia Byes needs to make a detailed note in epic regarding which anesthesia doc he talked to and the reasoning behind why cardiac clearance is not needed now even though Dr Caryn Section specified in [facility] that pt needed this cardiac clearance. Destiny verbalized understanding

## 2019-05-02 ENCOUNTER — Other Ambulatory Visit: Payer: Self-pay

## 2019-05-02 ENCOUNTER — Other Ambulatory Visit
Admission: RE | Admit: 2019-05-02 | Discharge: 2019-05-02 | Disposition: A | Discharge status: Home / Self Care | Payer: PPO | Source: Ambulatory Visit | Attending: Surgery | Admitting: Surgery

## 2019-05-02 DIAGNOSIS — Z01812 Encounter for preprocedural laboratory examination: Secondary | ICD-10-CM | POA: Diagnosis not present

## 2019-05-02 DIAGNOSIS — Z20822 Contact with and (suspected) exposure to covid-19: Secondary | ICD-10-CM | POA: Diagnosis not present

## 2019-05-02 LAB — CBC
HCT: 39.7 % (ref 39.0–52.0)
Hemoglobin: 13.5 g/dL (ref 13.0–17.0)
MCH: 32.8 pg (ref 26.0–34.0)
MCHC: 34 g/dL (ref 30.0–36.0)
MCV: 96.4 fL (ref 80.0–100.0)
Platelets: 209 10*3/uL (ref 150–400)
RBC: 4.12 MIL/uL — ABNORMAL LOW (ref 4.22–5.81)
RDW: 12.7 % (ref 11.5–15.5)
WBC: 6.9 10*3/uL (ref 4.0–10.5)
nRBC: 0 % (ref 0.0–0.2)

## 2019-05-02 LAB — SARS CORONAVIRUS 2 (TAT 6-24 HRS): SARS Coronavirus 2: NEGATIVE

## 2019-05-05 MED ORDER — CEFAZOLIN SODIUM-DEXTROSE 2-4 GM/100ML-% IV SOLN
2.0000 g | INTRAVENOUS | Status: AC
  Administered 2019-05-06: 2 g via INTRAVENOUS

## 2019-05-05 NOTE — Progress Notes (Signed)
I Had an extensive discussion with Dr. Kayleen Memos from anesthesia and he reviewed his EKG as well as prior anesthesia note from lobectomy.  Only the patient is not having any cardiac symptoms and Dr. Brayton Mars with proceed with scheduled surgery.

## 2019-05-05 NOTE — Progress Notes (Signed)
Anesthesiology:   After review of Mr. [name] chart, recent extensive surgery, medical clearance and all labs and EKG, we conclude also that he should be low risk and should not need additional cardiac clearance prior to surgery.      Thank you,  Valaria Good MD

## 2019-05-06 ENCOUNTER — Inpatient Hospital Stay
Admission: RE | Admit: 2019-05-06 | Discharge: 2019-05-07 | DRG: 328 | Disposition: A | Discharge status: Home / Self Care | Payer: PPO | Attending: Surgery | Admitting: Surgery

## 2019-05-06 ENCOUNTER — Inpatient Hospital Stay: Payer: PPO | Admitting: Anesthesiology

## 2019-05-06 ENCOUNTER — Other Ambulatory Visit: Payer: Self-pay

## 2019-05-06 ENCOUNTER — Encounter
Admission: RE | Disposition: A | Discharge status: Home / Self Care | Payer: Self-pay | Source: Home / Self Care | Attending: Surgery

## 2019-05-06 ENCOUNTER — Encounter: Payer: Self-pay | Admitting: Surgery

## 2019-05-06 DIAGNOSIS — Z87891 Personal history of nicotine dependence: Secondary | ICD-10-CM

## 2019-05-06 DIAGNOSIS — N400 Enlarged prostate without lower urinary tract symptoms: Secondary | ICD-10-CM | POA: Diagnosis not present

## 2019-05-06 DIAGNOSIS — F418 Other specified anxiety disorders: Secondary | ICD-10-CM | POA: Diagnosis not present

## 2019-05-06 DIAGNOSIS — Z809 Family history of malignant neoplasm, unspecified: Secondary | ICD-10-CM

## 2019-05-06 DIAGNOSIS — Z974 Presence of external hearing-aid: Secondary | ICD-10-CM

## 2019-05-06 DIAGNOSIS — Z8249 Family history of ischemic heart disease and other diseases of the circulatory system: Secondary | ICD-10-CM

## 2019-05-06 DIAGNOSIS — F319 Bipolar disorder, unspecified: Secondary | ICD-10-CM | POA: Diagnosis not present

## 2019-05-06 DIAGNOSIS — I2510 Atherosclerotic heart disease of native coronary artery without angina pectoris: Secondary | ICD-10-CM | POA: Diagnosis not present

## 2019-05-06 DIAGNOSIS — K449 Diaphragmatic hernia without obstruction or gangrene: Secondary | ICD-10-CM

## 2019-05-06 DIAGNOSIS — K219 Gastro-esophageal reflux disease without esophagitis: Secondary | ICD-10-CM | POA: Diagnosis not present

## 2019-05-06 DIAGNOSIS — K222 Esophageal obstruction: Secondary | ICD-10-CM | POA: Diagnosis present

## 2019-05-06 DIAGNOSIS — Z20822 Contact with and (suspected) exposure to covid-19: Secondary | ICD-10-CM | POA: Diagnosis not present

## 2019-05-06 DIAGNOSIS — E785 Hyperlipidemia, unspecified: Secondary | ICD-10-CM | POA: Diagnosis not present

## 2019-05-06 DIAGNOSIS — E039 Hypothyroidism, unspecified: Secondary | ICD-10-CM | POA: Diagnosis not present

## 2019-05-06 DIAGNOSIS — J449 Chronic obstructive pulmonary disease, unspecified: Secondary | ICD-10-CM | POA: Diagnosis not present

## 2019-05-06 DIAGNOSIS — F429 Obsessive-compulsive disorder, unspecified: Secondary | ICD-10-CM | POA: Diagnosis present

## 2019-05-06 DIAGNOSIS — Z9889 Other specified postprocedural states: Secondary | ICD-10-CM | POA: Diagnosis present

## 2019-05-06 HISTORY — PX: XI ROBOTIC ASSISTED PARAESOPHAGEAL HERNIA REPAIR: SHX6871

## 2019-05-06 LAB — CBC
HCT: 35.1 % — ABNORMAL LOW (ref 39.0–52.0)
Hemoglobin: 11.8 g/dL — ABNORMAL LOW (ref 13.0–17.0)
MCH: 32.4 pg (ref 26.0–34.0)
MCHC: 33.6 g/dL (ref 30.0–36.0)
MCV: 96.4 fL (ref 80.0–100.0)
Platelets: 216 10*3/uL (ref 150–400)
RBC: 3.64 MIL/uL — ABNORMAL LOW (ref 4.22–5.81)
RDW: 12.7 % (ref 11.5–15.5)
WBC: 9.3 10*3/uL (ref 4.0–10.5)
nRBC: 0 % (ref 0.0–0.2)

## 2019-05-06 LAB — CREATININE, SERUM
Creatinine, Ser: 1.27 mg/dL — ABNORMAL HIGH (ref 0.61–1.24)
GFR calc Af Amer: >60 mL/min (ref >60)
GFR calc non Af Amer: 56 mL/min — ABNORMAL LOW (ref >60)

## 2019-05-06 SURGERY — REPAIR, HERNIA, PARAESOPHAGEAL, ROBOT-ASSISTED
Anesthesia: General

## 2019-05-06 MED ORDER — FENTANYL CITRATE (PF) 250 MCG/5ML IJ SOLN
INTRAMUSCULAR | Status: AC
  Filled 2019-05-06: qty 5

## 2019-05-06 MED ORDER — ROCURONIUM BROMIDE 10 MG/ML (PF) SYRINGE
PREFILLED_SYRINGE | INTRAVENOUS | Status: AC
  Filled 2019-05-06: qty 10

## 2019-05-06 MED ORDER — MORPHINE SULFATE (PF) 2 MG/ML IV SOLN: 2.0000 mg | INTRAVENOUS | Status: DC | PRN

## 2019-05-06 MED ORDER — ESMOLOL HCL 100 MG/10ML IV SOLN
INTRAVENOUS | Status: DC | PRN
  Administered 2019-05-06 (×2): 10 mg via INTRAVENOUS
  Administered 2019-05-06: 20 mg via INTRAVENOUS

## 2019-05-06 MED ORDER — GABAPENTIN 300 MG PO CAPS
ORAL_CAPSULE | ORAL | Status: AC
  Administered 2019-05-06: 10:00:00 300 mg via ORAL
  Filled 2019-05-06: qty 1

## 2019-05-06 MED ORDER — CHLORHEXIDINE GLUCONATE CLOTH 2 % EX PADS: 6.0000 | MEDICATED_PAD | Freq: Once | CUTANEOUS | Status: DC

## 2019-05-06 MED ORDER — DIVALPROEX SODIUM ER 500 MG PO TB24
1000.0000 mg | ORAL_TABLET | Freq: Every day | ORAL | Status: DC
  Administered 2019-05-06: 21:00:00 1000 mg via ORAL
  Filled 2019-05-06 (×2): qty 2

## 2019-05-06 MED ORDER — BUPIVACAINE-EPINEPHRINE (PF) 0.25% -1:200000 IJ SOLN
INTRAMUSCULAR | Status: AC
  Filled 2019-05-06: qty 30

## 2019-05-06 MED ORDER — ONDANSETRON HCL 4 MG/2ML IJ SOLN: 4.0000 mg | Freq: Once | INTRAMUSCULAR | Status: DC | PRN

## 2019-05-06 MED ORDER — BUPIVACAINE LIPOSOME 1.3 % IJ SUSP
INTRAMUSCULAR | Status: AC
  Filled 2019-05-06: qty 20

## 2019-05-06 MED ORDER — ONDANSETRON HCL 4 MG/2ML IJ SOLN: 4.0000 mg | Freq: Four times a day (QID) | INTRAMUSCULAR | Status: DC | PRN

## 2019-05-06 MED ORDER — LIDOCAINE HCL (CARDIAC) PF 100 MG/5ML IV SOSY
PREFILLED_SYRINGE | INTRAVENOUS | Status: DC | PRN
  Administered 2019-05-06: 100 mg via INTRAVENOUS

## 2019-05-06 MED ORDER — SUCCINYLCHOLINE CHLORIDE 20 MG/ML IJ SOLN
INTRAMUSCULAR | Status: DC | PRN
  Administered 2019-05-06: 100 mg via INTRAVENOUS

## 2019-05-06 MED ORDER — LIDOCAINE HCL (PF) 2 % IJ SOLN
INTRAMUSCULAR | Status: AC
  Filled 2019-05-06: qty 5

## 2019-05-06 MED ORDER — ESMOLOL HCL-SODIUM CHLORIDE 2500 MG/250ML IV SOLN: INTRAVENOUS | Status: DC | PRN

## 2019-05-06 MED ORDER — ZIPRASIDONE HCL 40 MG PO CAPS
40.0000 mg | ORAL_CAPSULE | Freq: Every day | ORAL | Status: DC
  Administered 2019-05-06: 18:00:00 40 mg via ORAL
  Filled 2019-05-06 (×2): qty 1

## 2019-05-06 MED ORDER — SUCCINYLCHOLINE CHLORIDE 200 MG/10ML IV SOSY
PREFILLED_SYRINGE | INTRAVENOUS | Status: AC
  Filled 2019-05-06: qty 10

## 2019-05-06 MED ORDER — LACTATED RINGERS IV SOLN: INTRAVENOUS | Status: DC | PRN

## 2019-05-06 MED ORDER — CELECOXIB 200 MG PO CAPS: 200.0000 mg | ORAL_CAPSULE | ORAL | Status: AC

## 2019-05-06 MED ORDER — SODIUM CHLORIDE 0.9 % IV SOLN: INTRAVENOUS | Status: DC

## 2019-05-06 MED ORDER — OXYCODONE HCL 5 MG PO TABS: 5.0000 mg | ORAL_TABLET | ORAL | Status: DC | PRN

## 2019-05-06 MED ORDER — HEPARIN SODIUM (PORCINE) 5000 UNIT/ML IJ SOLN
5000.0000 [IU] | Freq: Three times a day (TID) | INTRAMUSCULAR | Status: DC
  Administered 2019-05-07: 5000 [IU] via SUBCUTANEOUS
  Filled 2019-05-06: qty 1

## 2019-05-06 MED ORDER — DIPHENHYDRAMINE HCL 50 MG/ML IJ SOLN: 12.5000 mg | Freq: Four times a day (QID) | INTRAMUSCULAR | Status: DC | PRN

## 2019-05-06 MED ORDER — ONDANSETRON HCL 4 MG/2ML IJ SOLN
INTRAMUSCULAR | Status: AC
  Filled 2019-05-06: qty 2

## 2019-05-06 MED ORDER — FENTANYL CITRATE (PF) 100 MCG/2ML IJ SOLN: 25.0000 ug | INTRAMUSCULAR | Status: DC | PRN

## 2019-05-06 MED ORDER — PROCHLORPERAZINE MALEATE 10 MG PO TABS
10.0000 mg | ORAL_TABLET | Freq: Four times a day (QID) | ORAL | Status: DC | PRN
  Filled 2019-05-06: qty 1

## 2019-05-06 MED ORDER — ACETAMINOPHEN 500 MG PO TABS
1000.0000 mg | ORAL_TABLET | Freq: Four times a day (QID) | ORAL | Status: DC
  Administered 2019-05-06 – 2019-05-07 (×4): 1000 mg via ORAL
  Filled 2019-05-06 (×4): qty 2

## 2019-05-06 MED ORDER — LABETALOL HCL 5 MG/ML IV SOLN
INTRAVENOUS | Status: DC | PRN
  Administered 2019-05-06: 5 mg via INTRAVENOUS

## 2019-05-06 MED ORDER — BUPIVACAINE-EPINEPHRINE 0.25% -1:200000 IJ SOLN
INTRAMUSCULAR | Status: DC | PRN
  Administered 2019-05-06: 30 mL

## 2019-05-06 MED ORDER — FAMOTIDINE 20 MG PO TABS: 20.0000 mg | ORAL_TABLET | Freq: Once | ORAL | Status: DC

## 2019-05-06 MED ORDER — ONDANSETRON HCL 4 MG/2ML IJ SOLN
INTRAMUSCULAR | Status: DC | PRN
  Administered 2019-05-06: 4 mg via INTRAVENOUS

## 2019-05-06 MED ORDER — ACETAMINOPHEN 500 MG PO TABS
ORAL_TABLET | ORAL | Status: AC
  Administered 2019-05-06: 10:00:00 1000 mg via ORAL
  Filled 2019-05-06: qty 2

## 2019-05-06 MED ORDER — ONDANSETRON 4 MG PO TBDP: 4.0000 mg | ORAL_TABLET | Freq: Four times a day (QID) | ORAL | Status: DC | PRN

## 2019-05-06 MED ORDER — FENTANYL CITRATE (PF) 100 MCG/2ML IJ SOLN
INTRAMUSCULAR | Status: DC | PRN
  Administered 2019-05-06: 50 ug via INTRAVENOUS
  Administered 2019-05-06: 25 ug via INTRAVENOUS
  Administered 2019-05-06: 50 ug via INTRAVENOUS
  Administered 2019-05-06: 25 ug via INTRAVENOUS
  Administered 2019-05-06: 50 ug via INTRAVENOUS

## 2019-05-06 MED ORDER — PROPOFOL 10 MG/ML IV BOLUS
INTRAVENOUS | Status: DC | PRN
  Administered 2019-05-06: 130 mg via INTRAVENOUS

## 2019-05-06 MED ORDER — FENTANYL CITRATE (PF) 100 MCG/2ML IJ SOLN
INTRAMUSCULAR | Status: AC
  Filled 2019-05-06: qty 2

## 2019-05-06 MED ORDER — ACETAMINOPHEN 500 MG PO TABS: 1000.0000 mg | ORAL_TABLET | ORAL | Status: AC

## 2019-05-06 MED ORDER — SUGAMMADEX SODIUM 500 MG/5ML IV SOLN
INTRAVENOUS | Status: AC
  Filled 2019-05-06: qty 5

## 2019-05-06 MED ORDER — PROPOFOL 10 MG/ML IV BOLUS
INTRAVENOUS | Status: AC
  Filled 2019-05-06: qty 20

## 2019-05-06 MED ORDER — CEFAZOLIN SODIUM-DEXTROSE 2-4 GM/100ML-% IV SOLN
INTRAVENOUS | Status: AC
  Filled 2019-05-06: qty 100

## 2019-05-06 MED ORDER — SODIUM CHLORIDE FLUSH 0.9 % IV SOLN
INTRAVENOUS | Status: AC
  Filled 2019-05-06: qty 50

## 2019-05-06 MED ORDER — HYDRALAZINE HCL 20 MG/ML IJ SOLN: 10.0000 mg | INTRAMUSCULAR | Status: DC | PRN

## 2019-05-06 MED ORDER — FAMOTIDINE 20 MG PO TABS
ORAL_TABLET | ORAL | Status: AC
  Administered 2019-05-06: 10:00:00 20 mg
  Filled 2019-05-06: qty 1

## 2019-05-06 MED ORDER — DIPHENHYDRAMINE HCL 12.5 MG/5ML PO ELIX
12.5000 mg | ORAL_SOLUTION | Freq: Four times a day (QID) | ORAL | Status: DC | PRN
  Filled 2019-05-06: qty 5

## 2019-05-06 MED ORDER — ROCURONIUM BROMIDE 100 MG/10ML IV SOLN
INTRAVENOUS | Status: DC | PRN
  Administered 2019-05-06: 40 mg via INTRAVENOUS
  Administered 2019-05-06: 20 mg via INTRAVENOUS
  Administered 2019-05-06: 10 mg via INTRAVENOUS
  Administered 2019-05-06: 20 mg via INTRAVENOUS

## 2019-05-06 MED ORDER — ESMOLOL HCL 100 MG/10ML IV SOLN
INTRAVENOUS | Status: AC
  Filled 2019-05-06: qty 10

## 2019-05-06 MED ORDER — SUGAMMADEX SODIUM 200 MG/2ML IV SOLN
INTRAVENOUS | Status: DC | PRN
  Administered 2019-05-06: 200 mg via INTRAVENOUS
  Administered 2019-05-06: 100 mg via INTRAVENOUS

## 2019-05-06 MED ORDER — PROCHLORPERAZINE EDISYLATE 10 MG/2ML IJ SOLN: 5.0000 mg | Freq: Four times a day (QID) | INTRAMUSCULAR | Status: DC | PRN

## 2019-05-06 MED ORDER — GABAPENTIN 300 MG PO CAPS: 300.0000 mg | ORAL_CAPSULE | ORAL | Status: AC

## 2019-05-06 MED ORDER — LABETALOL HCL 5 MG/ML IV SOLN
INTRAVENOUS | Status: AC
  Filled 2019-05-06: qty 4

## 2019-05-06 MED ORDER — CELECOXIB 200 MG PO CAPS
ORAL_CAPSULE | ORAL | Status: AC
  Administered 2019-05-06: 200 mg via ORAL
  Filled 2019-05-06: qty 1

## 2019-05-06 MED ORDER — LACTATED RINGERS IV SOLN: INTRAVENOUS | Status: DC

## 2019-05-06 MED ORDER — KETOROLAC TROMETHAMINE 15 MG/ML IJ SOLN
15.0000 mg | Freq: Four times a day (QID) | INTRAMUSCULAR | Status: DC
  Administered 2019-05-06 – 2019-05-07 (×4): 15 mg via INTRAVENOUS
  Filled 2019-05-06 (×4): qty 1

## 2019-05-06 MED ORDER — BUPIVACAINE LIPOSOME 1.3 % IJ SUSP
INTRAMUSCULAR | Status: DC | PRN
  Administered 2019-05-06: 20 mL

## 2019-05-06 SURGICAL SUPPLY — 59 items
"PENCIL ELECTRO HAND CTR " (MISCELLANEOUS) ×1 IMPLANT
ADH SKN CLS APL DERMABOND .7 (GAUZE/BANDAGES/DRESSINGS) ×1
APL PRP STRL LF DISP 70% ISPRP (MISCELLANEOUS) ×1
CANISTER SUCT 1200ML W/VALVE (MISCELLANEOUS) ×2 IMPLANT
CANNULA REDUC XI 12-8 STAPL (CANNULA) ×2
CANNULA REDUCER 12-8 DVNC XI (CANNULA) IMPLANT
CHLORAPREP W/TINT 26 (MISCELLANEOUS) ×2 IMPLANT
COVER WAND RF STERILE (DRAPES) ×2 IMPLANT
DECANTER SPIKE VIAL GLASS SM (MISCELLANEOUS) ×2 IMPLANT
DEFOGGER SCOPE WARMER CLEARIFY (MISCELLANEOUS) ×2 IMPLANT
DERMABOND ADVANCED (GAUZE/BANDAGES/DRESSINGS) ×1
DERMABOND ADVANCED .7 DNX12 (GAUZE/BANDAGES/DRESSINGS) ×1 IMPLANT
DRAPE 3/4 80X56 (DRAPES) ×2 IMPLANT
DRAPE ARM DVNC X/XI (DISPOSABLE) ×4 IMPLANT
DRAPE COLUMN DVNC XI (DISPOSABLE) ×1 IMPLANT
DRAPE DA VINCI XI ARM (DISPOSABLE) ×8
DRAPE DA VINCI XI COLUMN (DISPOSABLE) ×2
ELECT CAUTERY BLADE 6.4 (BLADE) ×2 IMPLANT
ELECT REM PT RETURN 9FT ADLT (ELECTROSURGICAL) ×2
ELECTRODE REM PT RTRN 9FT ADLT (ELECTROSURGICAL) ×1 IMPLANT
GLOVE BIO SURGEON STRL SZ7 (GLOVE) ×6 IMPLANT
GOWN STRL REUS W/ TWL LRG LVL3 (GOWN DISPOSABLE) ×4 IMPLANT
GOWN STRL REUS W/TWL LRG LVL3 (GOWN DISPOSABLE) ×8
GRASPER LAPSCPC 5X45 DSP (INSTRUMENTS) ×1 IMPLANT
IRRIGATION STRYKERFLOW (MISCELLANEOUS) IMPLANT
IRRIGATOR STRYKERFLOW (MISCELLANEOUS)
IV NS 1000ML (IV SOLUTION)
IV NS 1000ML BAXH (IV SOLUTION) IMPLANT
KIT PINK PAD W/HEAD ARE REST (MISCELLANEOUS) ×2
KIT PINK PAD W/HEAD ARM REST (MISCELLANEOUS) ×1 IMPLANT
KIT TURNOVER CYSTO (KITS) ×2 IMPLANT
LABEL OR SOLS (LABEL) ×2 IMPLANT
NEEDLE HYPO 22GX1.5 SAFETY (NEEDLE) ×2 IMPLANT
OBTURATOR OPTICAL STANDARD 8MM (TROCAR) ×2
OBTURATOR OPTICAL STND 8 DVNC (TROCAR) ×1
OBTURATOR OPTICALSTD 8 DVNC (TROCAR) ×1 IMPLANT
PACK LAP CHOLECYSTECTOMY (MISCELLANEOUS) ×2 IMPLANT
PENCIL ELECTRO HAND CTR (MISCELLANEOUS) ×2 IMPLANT
SEAL CANN UNIV 5-8 DVNC XI (MISCELLANEOUS) ×4 IMPLANT
SEAL XI 5MM-8MM UNIVERSAL (MISCELLANEOUS) ×6
SEALER VESSEL DA VINCI XI (MISCELLANEOUS) ×2
SEALER VESSEL EXT DVNC XI (MISCELLANEOUS) ×1 IMPLANT
SOLUTION ELECTROLUBE (MISCELLANEOUS) ×2 IMPLANT
SPONGE LAP 18X18 RF (DISPOSABLE) ×2 IMPLANT
STAPLER CANNULA SEAL DVNC XI (STAPLE) IMPLANT
STAPLER CANNULA SEAL XI (STAPLE) ×2
SUT MNCRL 4-0 (SUTURE) ×4
SUT MNCRL 4-0 27XMFL (SUTURE) ×2
SUT SILK 2 0 SH (SUTURE) ×6 IMPLANT
SUT VIC AB 2-0 SH 27 (SUTURE) ×4
SUT VIC AB 2-0 SH 27XBRD (SUTURE) IMPLANT
SUT VICRYL 0 AB UR-6 (SUTURE) ×4 IMPLANT
SUT VLOC 90 S/L VL9 GS22 (SUTURE) ×2 IMPLANT
SUTURE MNCRL 4-0 27XMF (SUTURE) ×1 IMPLANT
TRAY FOLEY SLVR 16FR LF STAT (SET/KITS/TRAYS/PACK) ×2 IMPLANT
TROCAR 130MM GELPORT  DAV (MISCELLANEOUS) ×1 IMPLANT
TROCAR BALLN GELPORT 12X130M (ENDOMECHANICALS) ×1 IMPLANT
TROCAR XCEL NON-BLD 5MMX100MML (ENDOMECHANICALS) ×2 IMPLANT
TUBING EVAC SMOKE HEATED PNEUM (TUBING) ×2 IMPLANT

## 2019-05-06 NOTE — Interval H&P Note (Signed)
History and Physical Interval Note:  05/06/2019 11:04 AM  [name]  has presented today for surgery, with the diagnosis of Hiatal hernia.  The various methods of treatment have been discussed with the patient and family. After consideration of risks, benefits and other options for treatment, the patient has consented to  Procedure(s): XI ROBOTIC ASSISTED PARAESOPHAGEAL HERNIA REPAIR ([name]) as a surgical intervention.  The patient's history has been reviewed, patient examined, no change in status, stable for surgery.  I have reviewed the patient's chart and labs.  Questions were answered to the patient's satisfaction.     [place]

## 2019-05-06 NOTE — Op Note (Signed)
Robotic assisted laparoscopic repair of  Type III paraesophageal hernia and Nissen fundoplication  Pre-operative Diagnosis: GERD, paraesophageal hernia  Post-operative Diagnosis: same  Procedure:  Robotic assisted laparoscopic Nissen fundoplication w repair of paraesophageal hernia  Surgeon: Caroleen Hamman, MD FACS  Assistant: Dr. Genevive Bi. Required due to the complexity of the case the need for exposure and lack of first assist.  Anesthesia: Gen. with endotracheal tube  Findings: Type III paraesophageal hernia Loose wrap 360 degree over 50 FR Bougie   Estimated Blood Loss: 15cc             Complications: none   Procedure Details  The patient was seen again in the Holding Room. The benefits, complications, treatment options, and expected outcomes were discussed with the patient. The risks of bleeding, infection, recurrence of symptoms, failure to resolve symptoms,  esophageal damage, Dysphagia, bowel injury, any of which could require further surgery were reviewed with the patient. The likelihood of improving the patient's symptoms with return to their baseline status is good.  The patient and/or family concurred with the proposed plan, giving informed consent.  The patient was taken to Operating Room, identified  and the procedure verified.  A Time Out was held and the above information confirmed.  Prior to the induction of general anesthesia, antibiotic prophylaxis was administered. VTE prophylaxis was in place. General endotracheal anesthesia was then administered and tolerated well. After the induction, the abdomen was prepped with Chloraprep and draped in the sterile fashion. The patient was positioned in the supine position.  Cut down technique was used to enter the abdominal cavity and a Hasson trochar was placed after two vicryl stitches were anchored to the fascia.  o evidence of bowel injury observed. Pneumoperitoneum was then created with CO2 and tolerated well without any adverse  changes in the patient's vital signs.  Three 8-mm ports were placed under direct vision. All skin incisions  were infiltrated with a local anesthetic agent before making the incision and placing the trocars. An additional 5 mm regular laparoscopic port was placed to assist with retraction and exposure.   The patient was positioned  in reverse Trendelenburg, robot was brought to the surgical field and docked in the standard fashion.  We made sure all the instrumentation was kept indirect view at all times and that there were no collision between the arms. I scrubbed out and went to the console.  I used a tip up toretract the liver, the vessel sealer on my right hand and a forced bipolar grasper on my left hand.  I used the extra 5 mm port allow me ample exposure and the ability to perform meticulous dissection  We Started dividing the lesser omentum via the pars flaccida.  We Were able to dissect the lesser curvature of the stomach and  dissected the fundus free from the right and left crus.  We circumferentially dissected the GE junction.  The hernia sac was also completely reduced and we were able to bring the stomach into the intra-abdominal position.  Attention then was turned to the greater curvature where the short gastrics were divided with sealer device.  We were able to identify the left crus and again were able to make sure there was a good circumferential dissection and that the hernia sac was completely excised.  We did perform a good dissection within the mediastinum to allow a complete reduction of the sac and  to completely allow an intra-abdominal Nissen fundoplication with plenty of esophageal length.  2-0V lock  suture was inserted and the crus as well as the hernia was closed with a running suture  We Asked anesthesia to place a 50 French bougie and this went easily, Initially CRNA placed a small bougie and we could not visualize it but eventually after up sizing the Bougie we did observe  its  Trajectory. 360 degree Nissen fundoplication was created with multiple 2-0 silk sutures and we placed 3 stitches taking some of the esophagus within that bite.  The fundoplication measured approximately 3-1/2 cm and he was floppy. I was very happy with the way the fundoplication laid and the repair of the hernia.  Inspection of the  upper quadrant was performed. No bleeding, bile  Or esophageal injuries leaks, or bowel injuries were noted. Robotic instruments and robotic arms were undocked in the standard fashion. All the needles were removed under direct visualization.   I scrubbed back in.  Pneumoperitoneum was released.  The periumbilical port site was closed with interrumpted 0 Vicryl sutures. 4-0 subcuticular Monocryl was used to close the skin. Liposomal marcaine was injected to all the incisions sites.  Dermabond was  applied.  The patient was then extubated and brought to the recovery room in stable condition. Sponge, lap, and needle counts were correct at closure and at the conclusion of the case.               Caroleen Hamman, MD, FACS

## 2019-05-06 NOTE — Transfer of Care (Signed)
Immediate Anesthesia Transfer of Care Note  Patient: [name]  Procedure(s) Performed: XI ROBOTIC ASSISTED PARAESOPHAGEAL HERNIA REPAIR ([name] )  Patient Location: PACU  Anesthesia Type:General  Level of Consciousness: awake and oriented  Airway & Oxygen Therapy: Patient Spontanous Breathing and Patient connected to face mask oxygen  Post-op Assessment: Report given to RN and Post -op Vital signs reviewed and stable  Post vital signs: Reviewed and stable  Last Vitals:  Vitals Value Taken Time  BP 160/80 05/06/19 1419  Temp 36.1 C 05/06/19 1419  Pulse 67 05/06/19 1424  Resp 17 05/06/19 1424  SpO2 100 % 05/06/19 1424  Vitals shown include unvalidated device data.  Last Pain:  Vitals:   05/06/19 0950  TempSrc: Temporal  PainSc: 0-No pain         Complications: No apparent anesthesia complications

## 2019-05-06 NOTE — Anesthesia Preprocedure Evaluation (Signed)
Anesthesia Evaluation  Patient identified by MRN, date of birth, ID band Patient awake    Reviewed: Allergy & Precautions, H&P , NPO status , Patient's Chart, lab work & pertinent test results, reviewed documented beta blocker date and time   History of Anesthesia Complications Negative for: history of anesthetic complications  Airway Mallampati: II  TM Distance: >3 FB Neck ROM: full    Dental  (+) Dental Advidsory Given, Edentulous Upper, Edentulous Lower   Pulmonary shortness of breath and with exertion, COPD,  COPD inhaler, neg recent URI, Patient abstained from smoking., former smoker,    Pulmonary exam normal        Cardiovascular Exercise Tolerance: Good + CAD and + Peripheral Vascular Disease  Normal cardiovascular exam  Patent foramen ovale   Neuro/Psych PSYCHIATRIC DISORDERS Anxiety Depression Bipolar Disorder  Neuromuscular disease    GI/Hepatic Neg liver ROS, hiatal hernia, GERD  ,  Endo/Other  Hypothyroidism   Renal/GU negative Renal ROS  negative genitourinary   Musculoskeletal  (+) Arthritis ,   Abdominal   Peds negative pediatric ROS (+)  Hematology negative hematology ROS (+)   Anesthesia Other Findings Past Medical History: No date: Arthritis     Comment:  in his fingers No date: Bipolar disorder (HCC) No date: BPH (benign prostatic hyperplasia) No date: Depression No date: Emphysema No date: Episodic confusion     Comment:  Possible bipolar disorder No date: GERD (gastroesophageal reflux disease) No date: History of chicken pox No date: History of hiatal hernia No date: HNP (herniated nucleus pulposus), lumbar No date: Hyperlipidemia 07/31/2012: Memory deficit No date: OCD (obsessive compulsive disorder) No date: Peripheral neuropathy 01/10/2003: Restless leg syndrome 03/01/2012: Unspecified psychosis   Reproductive/Obstetrics negative OB ROS                              Anesthesia Physical  Anesthesia Plan  ASA: III  Anesthesia Plan: General   Post-op Pain Management:    Induction: Intravenous  PONV Risk Score and Plan: 2 and Ondansetron, Dexamethasone and Treatment may vary due to age or medical condition  Airway Management Planned: Oral ETT  Additional Equipment:   Intra-op Plan:   Post-operative Plan: Extubation in OR  Informed Consent: I have reviewed the patients History and Physical, chart, labs and discussed the procedure including the risks, benefits and alternatives for the proposed anesthesia with the patient or authorized representative who has indicated his/her understanding and acceptance.     Dental Advisory Given  Plan Discussed with: Anesthesiologist, CRNA and Surgeon  Anesthesia Plan Comments:         Anesthesia Quick Evaluation

## 2019-05-06 NOTE — Anesthesia Procedure Notes (Signed)
Procedure Name: Intubation Date/Time: 05/06/2019 11:28 AM Performed by: Allean Found, CRNA Pre-anesthesia Checklist: Patient identified, Patient being monitored, Timeout performed, Emergency Drugs available and Suction available Patient Re-evaluated:Patient Re-evaluated prior to induction Oxygen Delivery Method: Circle system utilized Preoxygenation: Pre-oxygenation with 100% oxygen Induction Type: IV induction Ventilation: Mask ventilation without difficulty Laryngoscope Size: 3 and McGraph Grade View: Grade I Tube type: Oral Tube size: 7.0 mm Number of attempts: 1 Airway Equipment and Method: Stylet Placement Confirmation: ETT inserted through vocal cords under direct vision,  positive ETCO2 and breath sounds checked- equal and bilateral Secured at: 22 cm Tube secured with: Tape Dental Injury: Teeth and Oropharynx as per pre-operative assessment

## 2019-05-07 MED ORDER — HYDROCODONE-ACETAMINOPHEN 5-325 MG PO TABS: 1.0000 | ORAL_TABLET | Freq: Four times a day (QID) | ORAL | 0 refills | Status: DC | PRN

## 2019-05-07 NOTE — Anesthesia Postprocedure Evaluation (Signed)
Anesthesia Post Note  Patient: [name]  Procedure(s) Performed: XI ROBOTIC ASSISTED PARAESOPHAGEAL HERNIA REPAIR ([name] )  Patient location during evaluation: PACU Anesthesia Type: General Level of consciousness: awake and alert and oriented Pain management: pain level controlled Vital Signs Assessment: post-procedure vital signs reviewed and stable Respiratory status: spontaneous breathing Cardiovascular status: blood pressure returned to baseline Anesthetic complications: no     Last Vitals:  Vitals:   05/06/19 2020 05/07/19 0432  BP: 106/66 98/69  Pulse: 73 66  Resp: 20 20  Temp: 36.7 C 36.4 C  SpO2: 100% 98%    Last Pain:  Vitals:   05/07/19 0727  TempSrc:   PainSc: 0-No pain                 Sharlotte Baka

## 2019-05-07 NOTE — Progress Notes (Signed)
RN notified surgical PA Zack pt has not voided since surgery. RN bladder scan pt and it showed pt has 257 ml of urine on his bladder. RN assisted pt to stand up and try to void, but pt was not able to do it. Per PA will continue IVF running and continue to monitor pt for now.

## 2019-05-07 NOTE — Discharge Summary (Signed)
[facility] SURGICAL DISCHARGE SUMMARY   Patient ID: [name] MRN: [id number] DOB/AGE: 1945-06-23 74 y.o.  Admit date: 05/06/2019 Discharge date: 05/07/2019  Discharge Diagnoses Patient Active Problem List   Diagnosis Date Noted  . S/P Nissen fundoplication (without gastrostomy tube) procedure 05/06/2019  . Problems with swallowing and mastication   . Stricture and stenosis of esophagus     Consultants None  Procedures 05/06/2019:  Robotic assisted laparoscopic repair of  Type III paraesophageal hernia and Nissen fundoplication   HPI: 74 year old male with symptomatic hiatal hernia develop esophageal stricture status post dilation who presents to [facility] on 04/27 for robotic assisted laparoscopic nissen fundoplication with Dr Dahlia Byes.   Hospital Course: Informed consent was obtained and documented, and patient underwent uneventful Robotic assisted laparoscopic repair of  Type III paraesophageal hernia and Nissen fundoplication (Dr Dahlia Byes, [id number]/[id number]).  Post-operatively, patient's symptoms and pain improved/resolved and advancement of patient's diet and ambulation were well-tolerated. The remainder of patient's hospital course was essentially unremarkable, and discharge planning was initiated accordingly with patient safely able to be discharged home with appropriate discharge instructions, pain control, and outpatient follow-up after all of his questions were answered to his expressed satisfaction.   Discharge Condition: Good   Physical Examination:  Constitutional: Well appearing male, NAD Pulmonary: Normal effort, no respiratory distress Gastrointestinal: Soft, non-tender, non-distended, no rebound/guarding Skin: Laparoscopic incisions are CDI with dermabond, no erythema or drainage    Allergies as of 05/07/2019      Reactions   Ropinirole Hcl Other (See Comments)   Confusion, agiitation, disorientation, Hallucinations   Tramadol    lethargy       Medication List    TAKE these medications   aspirin 81 MG tablet Take 1 tablet (81 mg total) by mouth daily.   cholecalciferol 25 MCG (1000 UNIT) tablet Commonly known as: VITAMIN D3 Take 1,000 Units by mouth daily.   CoQ10 100 MG Caps Take 100 mg by mouth daily.   divalproex 500 MG 24 hr tablet Commonly known as: DEPAKOTE ER Take 1,000 mg by mouth at bedtime.   FISH OIL PO Take 1 capsule by mouth daily.   HYDROcodone-acetaminophen 5-325 MG tablet Commonly known as: NORCO/VICODIN Take 1-2 tablets by mouth every 6 (six) hours as needed for moderate pain.   omeprazole 40 MG capsule Commonly known as: PRILOSEC Take 1 capsule (40 mg total) by mouth daily.   simvastatin 40 MG tablet Commonly known as: ZOCOR TAKE 1 TABLET (40 MG TOTAL) BY MOUTH DAILY AT 6 PM.   vitamin B-12 1000 MCG tablet Commonly known as: CYANOCOBALAMIN Take 1,000 mcg by mouth daily.   ziprasidone 40 MG capsule Commonly known as: GEODON Take 40 mg by mouth daily with supper.        Follow-up Information    Jules Husbands, MD On 05/21/2019.   Specialty: General Surgery Why: 10:45am appointment Contact information: [address] [place] [address] [phone]            Time spent on discharge management including discussion of hospital course, clinical condition, outpatient instructions, prescriptions, and follow up with the patient and members of the medical team: >30 minutes  -- Edison Simon , PA-C [facility]  05/07/2019, 2:15 PM [phone] M-F: 7am - 4pm

## 2019-05-07 NOTE — Progress Notes (Signed)
Regnald H [place]  A and O x 4. VSS. Pt tolerating diet well. No complaints of pain or nausea. IV removed intact, prescriptions given. Pt voiced understanding of discharge instructions with no further questions. Pt discharged via wheelchair.   Allergies as of 05/07/2019      Reactions   Ropinirole Hcl Other (See Comments)   Confusion, agiitation, disorientation, Hallucinations   Tramadol    lethargy      Medication List    TAKE these medications   aspirin 81 MG tablet Take 1 tablet (81 mg total) by mouth daily.   cholecalciferol 25 MCG (1000 UNIT) tablet Commonly known as: VITAMIN D3 Take 1,000 Units by mouth daily.   CoQ10 100 MG Caps Take 100 mg by mouth daily.   divalproex 500 MG 24 hr tablet Commonly known as: DEPAKOTE ER Take 1,000 mg by mouth at bedtime.   FISH OIL PO Take 1 capsule by mouth daily.   HYDROcodone-acetaminophen 5-325 MG tablet Commonly known as: NORCO/VICODIN Take 1-2 tablets by mouth every 6 (six) hours as needed for moderate pain.   omeprazole 40 MG capsule Commonly known as: PRILOSEC Take 1 capsule (40 mg total) by mouth daily.   simvastatin 40 MG tablet Commonly known as: ZOCOR TAKE 1 TABLET (40 MG TOTAL) BY MOUTH DAILY AT 6 PM.   vitamin B-12 1000 MCG tablet Commonly known as: CYANOCOBALAMIN Take 1,000 mcg by mouth daily.   ziprasidone 40 MG capsule Commonly known as: GEODON Take 40 mg by mouth daily with supper.       Vitals:   05/07/19 0432 05/07/19 1116  BP: 98/69 109/77  Pulse: 66 62  Resp: 20 (!) 22  Temp: 97.6 F (36.4 C) (!) 97.5 F (36.4 C)  SpO2: 98% 98%    Francesco Sor

## 2019-05-19 DIAGNOSIS — H353211 Exudative age-related macular degeneration, right eye, with active choroidal neovascularization: Secondary | ICD-10-CM | POA: Diagnosis not present

## 2019-05-21 ENCOUNTER — Encounter: Payer: PPO | Admitting: Surgery

## 2019-05-26 ENCOUNTER — Other Ambulatory Visit: Payer: Self-pay

## 2019-05-26 ENCOUNTER — Ambulatory Visit (INDEPENDENT_AMBULATORY_CARE_PROVIDER_SITE_OTHER): Payer: Self-pay | Admitting: Surgery

## 2019-05-26 ENCOUNTER — Encounter: Payer: Self-pay | Admitting: Surgery

## 2019-05-26 VITALS — BP 121/78 | HR 87 | Temp 97.2°F | Resp 12 | Ht 70.0 in | Wt 150.4 lb

## 2019-05-26 DIAGNOSIS — Z09 Encounter for follow-up examination after completed treatment for conditions other than malignant neoplasm: Secondary | ICD-10-CM

## 2019-05-26 NOTE — Patient Instructions (Addendum)
Continue to AVOID drinking carbonated beverages. Still be careful with what you eat for the next 2 weeks.   Follow up as needed, call the office if you have any questions or concerns.   GENERAL POST-OPERATIVE PATIENT INSTRUCTIONS   WOUND CARE INSTRUCTIONS:  Keep a dry clean dressing on the wound if there is drainage. The initial bandage may be removed after 24 hours.  Once the wound has quit draining you may leave it open to air.  If clothing rubs against the wound or causes irritation and the wound is not draining you may cover it with a dry dressing during the daytime.  Try to keep the wound dry and avoid ointments on the wound unless directed to do so.  If the wound becomes bright red and painful or starts to drain infected material that is not clear, please contact your physician immediately.  If the wound is mildly pink and has a thick firm ridge underneath it, this is normal, and is referred to as a healing ridge.  This will resolve over the next 4-6 weeks.  BATHING: You may shower if you have been informed of this by your surgeon. However, Please do not submerge in a tub, hot tub, or pool until incisions are completely sealed or have been told by your surgeon that you may do so.  DIET:  You may eat any foods that you can tolerate.  It is a good idea to eat a high fiber diet and take in plenty of fluids to prevent constipation.  If you do become constipated you may want to take a mild laxative or take ducolax tablets on a daily basis until your bowel habits are regular.  Constipation can be very uncomfortable, along with straining, after recent surgery.  ACTIVITY:  You are encouraged to cough and deep breath or use your incentive spirometer if you were given one, every 15-30 minutes when awake.  This will help prevent respiratory complications and low grade fevers post-operatively if you had a general anesthetic.  You may want to hug a pillow when coughing and sneezing to add additional support  to the surgical area, if you had abdominal or chest surgery, which will decrease pain during these times.  You are encouraged to walk and engage in light activity for the next two weeks.  You should not lift more than 20 pounds, until 06/17/2019 as it could put you at increased risk for complications.  Twenty pounds is roughly equivalent to a plastic bag of groceries. At that time- Listen to your body when lifting, if you have pain when lifting, stop and then try again in a few days. Soreness after doing exercises or activities of daily living is normal as you get back in to your normal routine.  MEDICATIONS:  Try to take narcotic medications and anti-inflammatory medications, such as tylenol, ibuprofen, naprosyn, etc., with food.  This will minimize stomach upset from the medication.  Should you develop nausea and vomiting from the pain medication, or develop a rash, please discontinue the medication and contact your physician.  You should not drive, make important decisions, or operate machinery when taking narcotic pain medication.  SUNBLOCK Use sun block to incision area over the next year if this area will be exposed to sun. This helps decrease scarring and will allow you avoid a permanent darkened area over your incision.  QUESTIONS:  Please feel free to call our office if you have any questions, and we will be glad to assist you.

## 2019-05-26 NOTE — Progress Notes (Signed)
S/p robotic hiatal repair Doing very well Compliant w diet, no N/V No fevers, no dysphagia No reflux  PE NAD Abd: soft, nt, incisions c.d/i  A/P Doing very well w/o complications Advance diet slowly No heavy lifting RTC prn

## 2019-06-11 ENCOUNTER — Telehealth: Payer: Self-pay

## 2019-06-11 DIAGNOSIS — C3411 Malignant neoplasm of upper lobe, right bronchus or lung: Secondary | ICD-10-CM

## 2019-06-11 NOTE — Telephone Encounter (Signed)
Survivorship Care Plan visit completed.  Treatment summary reviewed and mailed to patient.  ASCO answers booklet reviewed and mailed to patient.  CARE program and Cancer Transitions discussed with patient along with other resources cancer center offers to patients and caregivers.  Patient verbalized understanding.  SCP packet mailed.  Patient in agreement for APP to have a Virtual visit to introduce them to the Survivorship Clinic.  Encouraged patient to call for any questions or concerns. 

## 2019-06-17 DIAGNOSIS — F3172 Bipolar disorder, in full remission, most recent episode hypomanic: Secondary | ICD-10-CM | POA: Diagnosis not present

## 2019-06-23 ENCOUNTER — Telehealth: Payer: Self-pay | Admitting: *Deleted

## 2019-06-23 NOTE — Telephone Encounter (Signed)
Per pts sister Jamal Maes request to cx 06/26/19 appt. And did not wish to have it R/S at this time.  Appt was cx as requested

## 2019-06-25 DIAGNOSIS — H353211 Exudative age-related macular degeneration, right eye, with active choroidal neovascularization: Secondary | ICD-10-CM | POA: Diagnosis not present

## 2019-06-26 ENCOUNTER — Inpatient Hospital Stay: Payer: PPO | Admitting: Oncology

## 2019-07-31 DIAGNOSIS — H353211 Exudative age-related macular degeneration, right eye, with active choroidal neovascularization: Secondary | ICD-10-CM | POA: Diagnosis not present

## 2019-09-04 DIAGNOSIS — H353211 Exudative age-related macular degeneration, right eye, with active choroidal neovascularization: Secondary | ICD-10-CM | POA: Diagnosis not present

## 2019-09-16 ENCOUNTER — Other Ambulatory Visit: Payer: Self-pay | Admitting: Family Medicine

## 2019-10-10 IMAGING — RF DG C-ARM 61-120 MIN
1 series · 2 of 2 positions shown · non-contrast
Comparison: 11/25/2017 lumbar spine MR.

CLINICAL DATA: 72-year-old male, fusion L4-5.  Initial encounter.

EXAM:
LUMBAR SPINE - 2-3 VIEW; DG C-ARM 61-120 MIN
Fluoroscopic time: 1 minutes and 2 seconds.

[Series 1: run · 2 of 2 slices shown]
[im 1/2]
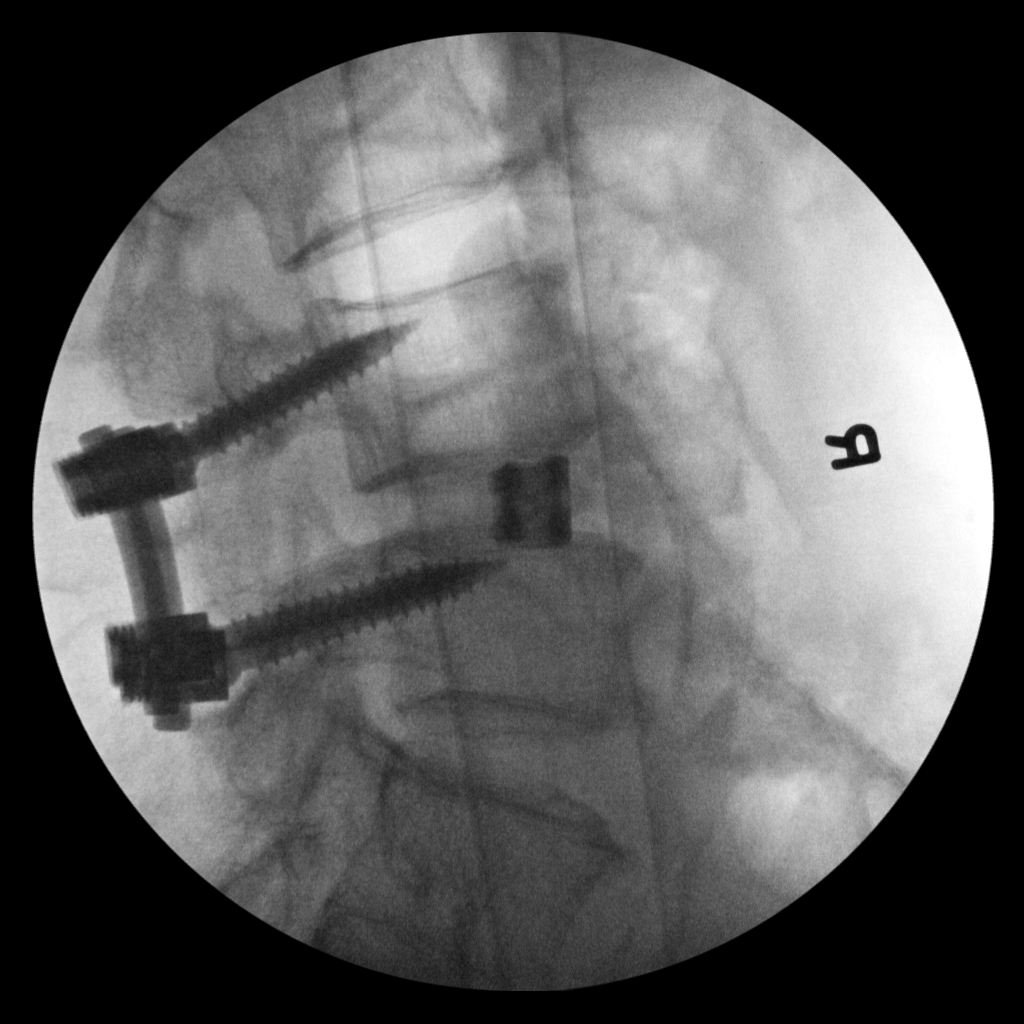
[im 2/2]
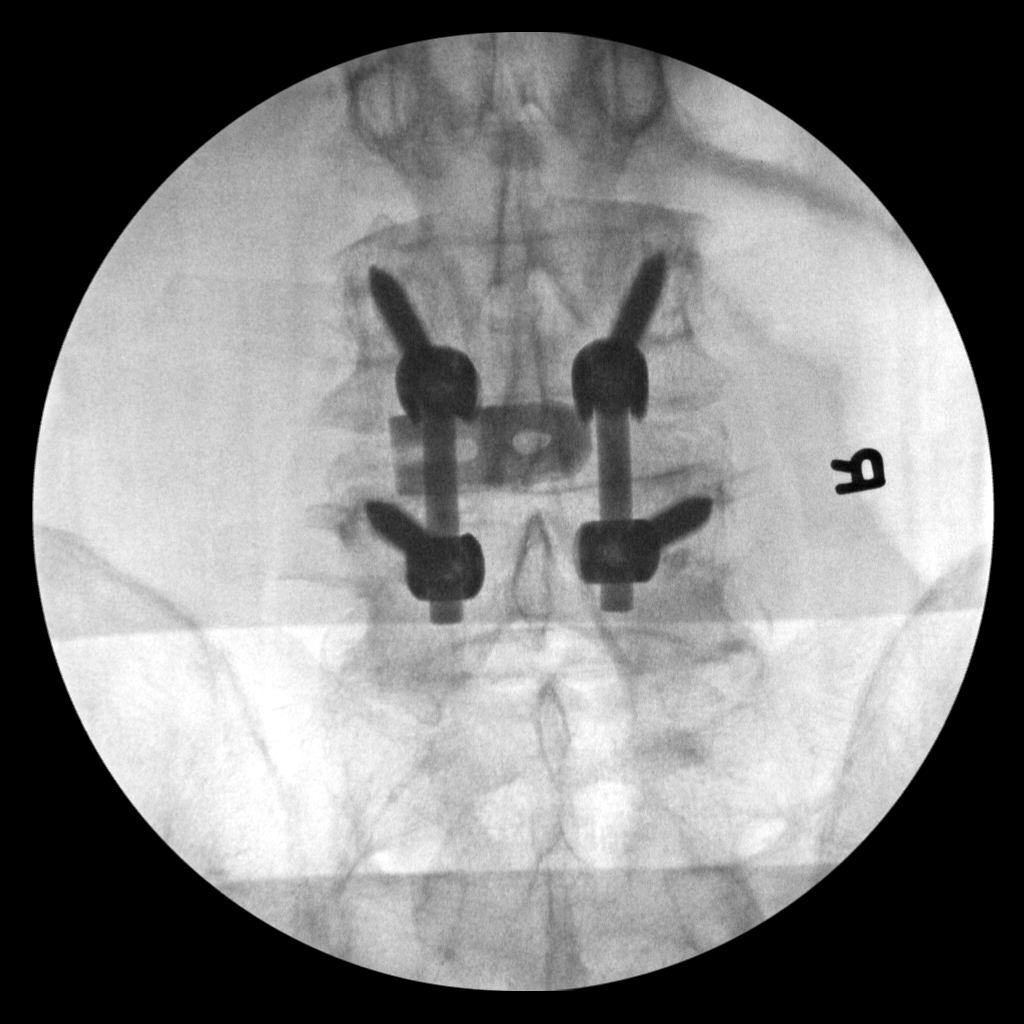

[2 of 2 positions shown; findings below may reference images not displayed]

FINDINGS: Two intraoperative C-arm views submitted for review after surgery.
Transitional vertebra previously labeled S1. The full extent of the
S1-2 rudimentary disc space is not entirely included on lateral
view. It appears patient is undergone L4-5 fusion with interbody
spacer, bilateral pedicle screws and posterior connecting bar
without complication noted. This can be assessed on follow-up.
IMPRESSION: Fusion L4-5 as noted above.

## 2019-10-15 DIAGNOSIS — H353211 Exudative age-related macular degeneration, right eye, with active choroidal neovascularization: Secondary | ICD-10-CM | POA: Diagnosis not present

## 2019-10-16 DIAGNOSIS — F3172 Bipolar disorder, in full remission, most recent episode hypomanic: Secondary | ICD-10-CM | POA: Diagnosis not present

## 2019-11-20 DIAGNOSIS — H353211 Exudative age-related macular degeneration, right eye, with active choroidal neovascularization: Secondary | ICD-10-CM | POA: Diagnosis not present

## 2019-12-22 DIAGNOSIS — H353211 Exudative age-related macular degeneration, right eye, with active choroidal neovascularization: Secondary | ICD-10-CM | POA: Diagnosis not present

## 2020-01-01 NOTE — Progress Notes (Deleted)
Annual Wellness Visit     Patient: [name], Male    DOB: [birth year], 74 y.o.   MRN: [id number] Visit Date: 01/05/2020  Today's Provider: Lelon Huh, MD   No chief complaint on file.  Subjective    [name] is a 74 y.o. male who presents today for his Annual Wellness Visit. He reports consuming a {diet types:17450} diet. {Exercise:19826} He generally feels {well/fairly well/poorly:18703}. He reports sleeping {well/fairly well/poorly:18703}. He {does/does not:200015} have additional problems to discuss today.   HPI Hypothyroid, follow-up  Lab Results  Component Value Date   TSH 2.420 11/13/2018   TSH 3.490 08/06/2017   TSH 5.710 (H) 07/19/2016   T4TOTAL 6.3 07/19/2016   T4TOTAL 5.5 07/19/2015   Wt Readings from Last 3 Encounters:  05/26/19 150 lb 6.4 oz (68.2 kg)  05/06/19 156 lb 1.4 oz (70.8 kg)  04/25/19 156 lb (70.8 kg)    He was last seen for hypothyroid 1 year ago.  Management since that visit includes none. He reports {excellent/good/fair/poor:19665} compliance with treatment. He {is/is not:21021397} having side effects. {document side effects if present:1}  Symptoms: {Yes/No:20286} change in energy level {Yes/No:20286} constipation  {Yes/No:20286} diarrhea {Yes/No:20286} heat / cold intolerance  {Yes/No:20286} nervousness {Yes/No:20286} palpitations  {Yes/No:20286} weight changes    -----------------------------------------------------------------------------------------  Follow up for COPD:  The patient was last seen for this 1 year ago. Management during that visit includes encouraging smoking cessation.   He reports {excellent/good/fair/poor:19665} compliance with treatment. He feels that condition is {improved/worse/unchanged:3041574}. He {is/is not:21021397} having side effects. ***  -----------------------------------------------------------------------------------------  Lipid/Cholesterol, Follow-up  Last lipid panel  Other pertinent labs  Lab Results  Component Value Date   CHOL 160 11/13/2018   HDL 69 11/13/2018   LDLCALC 74 11/13/2018   TRIG 94 11/13/2018   CHOLHDL 2.3 11/13/2018   Lab Results  Component Value Date   ALT 21 01/01/2019   AST 26 01/01/2019   PLT 216 05/06/2019   TSH 2.420 11/13/2018     He was last seen for this 1 year ago.  Management since that visit includes continuing same medication.  He reports {excellent/good/fair/poor:19665} compliance with treatment. He {is/is not:9024} having side effects. {document side effects if present:1}  Symptoms: {Yes/No:20286} chest pain {Yes/No:20286} chest pressure/discomfort  {Yes/No:20286} dyspnea {Yes/No:20286} lower extremity edema  {Yes/No:20286} numbness or tingling of extremity {Yes/No:20286} orthopnea  {Yes/No:20286} palpitations {Yes/No:20286} paroxysmal nocturnal dyspnea  {Yes/No:20286} speech difficulty {Yes/No:20286} syncope   Current diet: {diet habits:16563} Current exercise: {exercise types:16438}  The 10-year ASCVD risk score Mikey Bussing DC Jr., et al., 2013) is: 19.6%  ---------------------------------------------------------------------------------------------------   {Show patient history (optional):23778::" "}   Medications: Outpatient Medications Prior to Visit  Medication Sig  . aspirin 81 MG tablet Take 1 tablet (81 mg total) by mouth daily.  . cholecalciferol (VITAMIN D3) 25 MCG (1000 UT) tablet Take 1,000 Units by mouth daily.  . Coenzyme Q10 (COQ10) 100 MG CAPS Take 100 mg by mouth daily.   . divalproex (DEPAKOTE ER) 500 MG 24 hr tablet Take 1,000 mg by mouth at bedtime.   . Omega-3 Fatty Acids (FISH OIL PO) Take 1 capsule by mouth daily.  [name] omeprazole (PRILOSEC) 40 MG capsule TAKE 1 CAPSULE BY MOUTH EVERY DAY  . simvastatin (ZOCOR) 40 MG tablet TAKE 1 TABLET (40 MG TOTAL) BY MOUTH DAILY AT 6 PM.  . vitamin B-12 (CYANOCOBALAMIN) 1000 MCG tablet Take 1,000 mcg by mouth daily.  . ziprasidone (GEODON) 40 MG  capsule Take 40 mg by  mouth daily with supper.    No facility-administered medications prior to visit.    Allergies  Allergen Reactions  . Ropinirole Hcl Other (See Comments)    Confusion, agiitation, disorientation, Hallucinations  . Tramadol     lethargy    Patient Care Team: Birdie Sons, MD as PCP - General (Family Medicine) Chauncey Mann, MD as Referring Physician (Psychiatry) Cammie Sickle, MD as Consulting Physician (Internal Medicine) [name], MD as Referring Physician (Cardiothoracic Surgery) Jules Husbands, MD as Consulting Physician (General Surgery)  Review of Systems  {Heme  Chem  Endocrine  Serology  Results Review (optional):23779::" "}  Objective    Vitals: There were no vitals taken for this visit. {Show previous vital signs (optional):23777::" "}  Physical Exam ***  Most recent functional status assessment: In your present state of health, do you have any difficulty performing the following activities: 05/06/2019  Hearing? Y  Comment -  Vision? N  Difficulty concentrating or making decisions? N  Walking or climbing stairs? N  Dressing or bathing? N  Doing errands, shopping? -  Some recent data might be hidden   Most recent fall risk assessment: Fall Risk  04/14/2019  Falls in the past year? 0  Comment -  Number falls in past yr: -  Injury with Fall? -    Most recent depression screenings: PHQ 2/9 Scores 11/13/2018 08/06/2017  PHQ - 2 Score 0 0  PHQ- 9 Score 0 3   Most recent cognitive screening: No flowsheet data found. Most recent Audit-C alcohol use screening Alcohol Use Disorder Test (AUDIT) 11/13/2018  1. How often do you have a drink containing alcohol? 0  2. How many drinks containing alcohol do you have on a typical day when you are drinking? 0  3. How often do you have six or more drinks on one occasion? 0  AUDIT-C Score 0   A score of 3 or more in women, and 4 or more in men indicates increased risk for alcohol  abuse, EXCEPT if all of the points are from question 1   No results found for any visits on 01/05/20.  Assessment & Plan     Annual wellness visit done today including the all of the following: Reviewed patient's Family Medical History Reviewed and updated list of patient's medical providers Assessment of cognitive impairment was done Assessed patient's functional ability Established a written schedule for health screening [place] Completed and Reviewed  Exercise Activities and Dietary recommendations Goals   None     Immunization History  Administered Date(s) Administered  . Fluad Quad(high Dose 65+) 10/28/2018  . Influenza, High Dose Seasonal PF 10/08/2015, 10/31/2016, 10/27/2017  . Pneumococcal Conjugate-13 07/14/2013  . Pneumococcal Polysaccharide-23 07/04/2011, 01/07/2019, 06/11/2019  . Tdap 06/04/2008  . Zoster 06/08/2009    Health Maintenance  Topic Date Due  . COVID-19 Vaccine (1) Never done  . TETANUS/TDAP  06/05/2018  . INFLUENZA VACCINE  08/10/2019  . COLONOSCOPY  08/08/2020  . Hepatitis C Screening  Completed  . PNA vac Low Risk Adult  Completed     Discussed health benefits of physical activity, and encouraged him to engage in regular exercise appropriate for his age and condition.    ***  No follow-ups on file.     {provider attestation***:1}   Lelon Huh, MD  [facility] [phone] (phone) [phone] (fax)  [place]

## 2020-01-05 ENCOUNTER — Encounter: Payer: PPO | Admitting: Family Medicine

## 2020-01-15 ENCOUNTER — Telehealth: Payer: Self-pay | Admitting: Family Medicine

## 2020-01-15 NOTE — Telephone Encounter (Signed)
Copied from [place] [id number]. Topic: Medicare AWV >> Jan 15, 2020 12:58 PM Cher Nakai R wrote: Reason for CRM:  Left message for patient to call back and schedule Medicare Annual Wellness Visit (AWV) in office.   If not able to come in office, please offer to do virtually.   Last AWV 11/13/2018  Please schedule at anytime with [facility] Health Advisor.  If any questions, please contact me at [phone]

## 2020-01-16 ENCOUNTER — Ambulatory Visit: Payer: PPO | Admitting: Internal Medicine

## 2020-01-16 ENCOUNTER — Encounter: Payer: Self-pay | Admitting: Internal Medicine

## 2020-01-16 ENCOUNTER — Other Ambulatory Visit: Payer: PPO

## 2020-01-19 ENCOUNTER — Inpatient Hospital Stay: Payer: Medicare HMO | Attending: Internal Medicine

## 2020-01-19 ENCOUNTER — Encounter: Payer: Self-pay | Admitting: Internal Medicine

## 2020-01-19 ENCOUNTER — Inpatient Hospital Stay: Payer: Medicare HMO | Admitting: Internal Medicine

## 2020-01-19 DIAGNOSIS — K219 Gastro-esophageal reflux disease without esophagitis: Secondary | ICD-10-CM | POA: Insufficient documentation

## 2020-01-19 DIAGNOSIS — Z801 Family history of malignant neoplasm of trachea, bronchus and lung: Secondary | ICD-10-CM | POA: Insufficient documentation

## 2020-01-19 DIAGNOSIS — Z8249 Family history of ischemic heart disease and other diseases of the circulatory system: Secondary | ICD-10-CM | POA: Insufficient documentation

## 2020-01-19 DIAGNOSIS — C3411 Malignant neoplasm of upper lobe, right bronchus or lung: Secondary | ICD-10-CM

## 2020-01-19 DIAGNOSIS — Z7982 Long term (current) use of aspirin: Secondary | ICD-10-CM | POA: Insufficient documentation

## 2020-01-19 DIAGNOSIS — E785 Hyperlipidemia, unspecified: Secondary | ICD-10-CM | POA: Insufficient documentation

## 2020-01-19 DIAGNOSIS — Z87891 Personal history of nicotine dependence: Secondary | ICD-10-CM | POA: Insufficient documentation

## 2020-01-19 DIAGNOSIS — N400 Enlarged prostate without lower urinary tract symptoms: Secondary | ICD-10-CM | POA: Insufficient documentation

## 2020-01-19 DIAGNOSIS — F319 Bipolar disorder, unspecified: Secondary | ICD-10-CM | POA: Diagnosis not present

## 2020-01-19 DIAGNOSIS — G2581 Restless legs syndrome: Secondary | ICD-10-CM | POA: Insufficient documentation

## 2020-01-19 DIAGNOSIS — Z833 Family history of diabetes mellitus: Secondary | ICD-10-CM | POA: Diagnosis not present

## 2020-01-19 DIAGNOSIS — J449 Chronic obstructive pulmonary disease, unspecified: Secondary | ICD-10-CM | POA: Insufficient documentation

## 2020-01-19 DIAGNOSIS — F429 Obsessive-compulsive disorder, unspecified: Secondary | ICD-10-CM | POA: Diagnosis not present

## 2020-01-19 DIAGNOSIS — Z79899 Other long term (current) drug therapy: Secondary | ICD-10-CM | POA: Diagnosis not present

## 2020-01-19 LAB — CBC WITH DIFFERENTIAL/PLATELET
Abs Immature Granulocytes: 0.01 10*3/uL (ref 0.00–0.07)
Basophils Absolute: 0.1 10*3/uL (ref 0.0–0.1)
Basophils Relative: 1 %
Eosinophils Absolute: 0.1 10*3/uL (ref 0.0–0.5)
Eosinophils Relative: 2 %
HCT: 38.3 % — ABNORMAL LOW (ref 39.0–52.0)
Hemoglobin: 13.2 g/dL (ref 13.0–17.0)
Immature Granulocytes: 0 %
Lymphocytes Relative: 26 %
Lymphs Abs: 1.5 10*3/uL (ref 0.7–4.0)
MCH: 34.6 pg — ABNORMAL HIGH (ref 26.0–34.0)
MCHC: 34.5 g/dL (ref 30.0–36.0)
MCV: 100.5 fL — ABNORMAL HIGH (ref 80.0–100.0)
Monocytes Absolute: 0.5 10*3/uL (ref 0.1–1.0)
Monocytes Relative: 9 %
Neutro Abs: 3.5 10*3/uL (ref 1.7–7.7)
Neutrophils Relative %: 62 %
Platelets: 182 10*3/uL (ref 150–400)
RBC: 3.81 MIL/uL — ABNORMAL LOW (ref 4.22–5.81)
RDW: 13.2 % (ref 11.5–15.5)
WBC: 5.7 10*3/uL (ref 4.0–10.5)
nRBC: 0 % (ref 0.0–0.2)

## 2020-01-19 LAB — COMPREHENSIVE METABOLIC PANEL
ALT: 14 U/L (ref 0–44)
AST: 22 U/L (ref 15–41)
Albumin: 3.8 g/dL (ref 3.5–5.0)
Alkaline Phosphatase: 59 U/L (ref 38–126)
Anion gap: 8 (ref 5–15)
BUN: 22 mg/dL (ref 8–23)
CO2: 27 mmol/L (ref 22–32)
Calcium: 8.6 mg/dL — ABNORMAL LOW (ref 8.9–10.3)
Chloride: 106 mmol/L (ref 98–111)
Creatinine, Ser: 1.28 mg/dL — ABNORMAL HIGH (ref 0.61–1.24)
GFR, Estimated: 59 mL/min — ABNORMAL LOW (ref >60)
Glucose, Bld: 114 mg/dL — ABNORMAL HIGH (ref 70–99)
Potassium: 4.1 mmol/L (ref 3.5–5.1)
Sodium: 141 mmol/L (ref 135–145)
Total Bilirubin: 0.5 mg/dL (ref 0.3–1.2)
Total Protein: 6.8 g/dL (ref 6.5–8.1)

## 2020-01-19 NOTE — Assessment & Plan Note (Addendum)
#  Squamous cell cancer right upper lobe stage Ia [incidental; T= 3.5 mm].  Clinically noted the recurrence.  However given his extensive smoking history; recommend surveillance imaging on a yearly basis.  #Smoker-quit smoking; recommend continued abstinence.  #COPD-stable.  Continue follow-up with pulmonary.   # DISPOSITION: [id number] Fay-sister.  # CT chest in 1 weeks # follow up TBD- Dr.B  Cc; Burgess Estelle

## 2020-01-19 NOTE — Progress Notes (Signed)
[name] NOTE  Patient Care Team: Birdie Sons, MD as PCP - General (Family Medicine) Chauncey Mann, MD as Referring Physician (Psychiatry) Cammie Sickle, MD as Consulting Physician (Internal Medicine) [name], MD as Referring Physician (Cardiothoracic Surgery) Jules Husbands, MD as Consulting Physician (General Surgery)  CHIEF COMPLAINTS/PURPOSE OF CONSULTATION:  Lung cancer  #  Oncology History Overview Note  # DEC 2021- RUL LUNG CANCER- SQUAMOUS CELL CA [pTIa;-3.5 MM squamous cell; s/p wedge resection Dr.Oaks; incidental]- negative margins  # lung RUL wedge resection- [s/p tube]  # > 2010- left lung resection [? Etiology/benign]  DIAGNOSIS: RUL LUNG CA  STAGE:   I      ;  GOALS: cure  CURRENT/MOST RECENT THERAPY : surveillaince       Cancer of upper lobe of right lung ([place])  01/16/2019 Initial Diagnosis   Cancer of upper lobe of right lung ([facility])      HISTORY OF PRESENTING ILLNESS:  [name] 75 y.o.  male prior history of smoking; and history of stage I incidental lung cancer is here for follow-up.  Patient continues to have chronic mild shortness of breath not any worse.  Chronic mild cough.  Review of Systems  Constitutional: Negative for chills, diaphoresis, fever and malaise/fatigue.  HENT: Negative for nosebleeds and sore throat.   Eyes: Negative for double vision.  Respiratory: Positive for cough and shortness of breath. Negative for hemoptysis and wheezing.   Cardiovascular: Negative for chest pain, palpitations, orthopnea and leg swelling.  Gastrointestinal: Negative for abdominal pain, blood in stool, constipation, diarrhea, heartburn, melena, nausea and vomiting.  Genitourinary: Negative for dysuria, frequency and urgency.  Musculoskeletal: Negative for back pain and joint pain.  Skin: Negative.  Negative for itching and rash.  Neurological: Negative for dizziness, tingling, focal weakness,  weakness and headaches.  Endo/Heme/Allergies: Does not bruise/bleed easily.  Psychiatric/Behavioral: Negative for depression. The patient is not nervous/anxious and does not have insomnia.      MEDICAL HISTORY:  Past Medical History:  Diagnosis Date  . Arthritis    in his fingers  . Bipolar disorder ([place])   . BPH (benign prostatic hyperplasia)   . Cancer ([place]) 2020   LUNG CA  . Depression   . Emphysema   . Episodic confusion    Possible bipolar disorder  . GERD (gastroesophageal reflux disease)   . History of chicken pox   . History of hiatal hernia   . HNP (herniated nucleus pulposus), lumbar   . Hyperlipidemia   . Memory deficit 07/31/2012  . OCD (obsessive compulsive disorder)   . Peripheral neuropathy   . Restless leg syndrome 01/10/2003  . Unspecified psychosis 03/01/2012  . Wears dentures    full upper and lower  . Wears hearing aid in both ears     SURGICAL HISTORY: Past Surgical History:  Procedure Laterality Date  . BACK SURGERY    . COLONOSCOPY    . ESOPHAGOGASTRODUODENOSCOPY    . ESOPHAGOGASTRODUODENOSCOPY (EGD) WITH PROPOFOL [name] 02/28/2019   Procedure: ESOPHAGOGASTRODUODENOSCOPY (EGD) WITH DILATION;  Surgeon: Lucilla Lame, MD;  Location: [place];  Service: Endoscopy;  Laterality: [name];  Priority 3  . FLEXIBLE BRONCHOSCOPY [name] 01/06/2019   Procedure: FLEXIBLE BRONCHOSCOPY;  Surgeon: [name], MD;  Location: [facility];  Service: Thoracic;  Laterality: [name];  . liver excision     for non-cancerous mass  . LUMBAR LAMINECTOMY/DECOMPRESSION MICRODISCECTOMY Left 06/22/2017   Procedure: Extraforaminal Microdiscectomy  - L4-L5 - left;  Surgeon: Eustace Moore, MD;  Location: [place];  Service: Neurosurgery;  Laterality: Left;  . LUNG SURGERY  2005   Lipoma Removal  . MAXIMUM ACCESS (MAS) TRANSFORAMINAL LUMBAR INTERBODY FUSION (TLIF) 1 LEVEL [name] 02/18/2018   Procedure: Transforaminal Lumbar Interbody Fusion - Lumbar Four - Lumbar Five;  Surgeon: Eustace Moore, MD;  Location: [place];  Service: Neurosurgery;  Laterality: [name];  Transforaminal Lumbar Interbody Fusion - Lumbar Four - Lumbar Five  . THORACOTOMY Right 01/06/2019   Procedure: THORACOTOMY MAJOR;  Surgeon: [name], MD;  Location: [facility];  Service: Thoracic;  Laterality: Right;  [name] VIDEO ASSISTED THORACOSCOPY (VATS)/WEDGE RESECTION Right 01/06/2019   Procedure: VIDEO ASSISTED THORACOSCOPY (VATS)/WEDGE RESECTION-RUL and RLL-lung resection;  Surgeon: [name], MD;  Location: [facility];  Service: Thoracic;  Laterality: Right;  . XI ROBOTIC ASSISTED PARAESOPHAGEAL HERNIA REPAIR [name] 05/06/2019   Procedure: XI ROBOTIC ASSISTED PARAESOPHAGEAL HERNIA REPAIR;  Surgeon: Jules Husbands, MD;  Location: [facility];  Service: General;  Laterality: [name];    SOCIAL HISTORY: Social History   Socioeconomic History  . Marital status: Divorced    Spouse name: Not on file  . Number of children: 0  . Years of education: 26  . Highest education level: Not on file  Occupational History  . Occupation: Retired  Tobacco Use  . Smoking status: Former Smoker    Packs/day: 0.75    Years: 50.00    Pack years: 37.50    Types: Cigarettes    Quit date: 11/26/2018    Years since quitting: 1.1  . Smokeless tobacco: Never Used  Vaping Use  . Vaping Use: Never used  Substance and Sexual Activity  . Alcohol use: No  . Drug use: No  . Sexual activity: Not on file  Other Topics Concern  . Not on file  Social History Narrative   Patient lives at home alone.   Patient is retired.    Patient has no children.    Patient has 11th grade education.    Social Determinants of Health   Financial Resource Strain: Not on file  Food Insecurity: Not on file  Transportation Needs: Not on file  Physical Activity: Not on file  Stress: Not on file  Social Connections: Not on file  Intimate Partner Violence: Not on file    FAMILY HISTORY: Family History  Problem Relation Age of Onset  . Heart disease Mother    . Cancer Father        lung  . Heart disease Father   . Diabetes Sister   . Heart disease Sister   . Seizures Paternal Grandfather     ALLERGIES:  is allergic to ropinirole hcl and tramadol.  MEDICATIONS:  Current Outpatient Medications  Medication Sig Dispense Refill  . aspirin 81 MG tablet Take 1 tablet (81 mg total) by mouth daily.    . cholecalciferol (VITAMIN D3) 25 MCG (1000 UT) tablet Take 1,000 Units by mouth daily.    . Coenzyme Q10 (COQ10) 100 MG CAPS Take 100 mg by mouth daily.     . divalproex (DEPAKOTE ER) 500 MG 24 hr tablet Take 1,000 mg by mouth at bedtime.     . Omega-3 Fatty Acids (FISH OIL PO) Take 1 capsule by mouth daily.    [name] omeprazole (PRILOSEC) 40 MG capsule TAKE 1 CAPSULE BY MOUTH EVERY DAY 90 capsule 2  . simvastatin (ZOCOR) 40 MG tablet TAKE 1 TABLET (40 MG TOTAL) BY MOUTH DAILY AT 6 PM. 90  tablet 4  . vitamin B-12 (CYANOCOBALAMIN) 1000 MCG tablet Take 1,000 mcg by mouth daily.    . ziprasidone (GEODON) 40 MG capsule Take 40 mg by mouth daily with supper.      No current facility-administered medications for this visit.      [name]  PHYSICAL EXAMINATION:  Vitals:   01/19/20 1041  BP: 113/81  Pulse: 73  Resp: 16  Temp: 97.8 F (36.6 C)  SpO2: 100%   Filed Weights   01/19/20 1041  Weight: 151 lb 12.8 oz (68.9 kg)    Physical Exam Constitutional:      Comments: Frail-appearing elderly male patient.  Accompanied by his sister.  Is walking himself.  HENT:     Head: Normocephalic and atraumatic.     Mouth/Throat:     Pharynx: No oropharyngeal exudate.  Eyes:     Pupils: Pupils are equal, round, and reactive to light.  Cardiovascular:     Rate and Rhythm: Normal rate and regular rhythm.  Pulmonary:     Effort: No respiratory distress.     Breath sounds: No wheezing.  Abdominal:     General: Bowel sounds are normal. There is no distension.     Palpations: Abdomen is soft. There is no mass.     Tenderness: There is no abdominal tenderness.  There is no guarding or rebound.  Musculoskeletal:        General: No tenderness. Normal range of motion.     Cervical back: Normal range of motion and neck supple.  Skin:    General: Skin is warm.  Neurological:     Mental Status: He is alert and oriented to person, place, and time.  Psychiatric:        Mood and Affect: Affect normal.      LABORATORY DATA:  I have reviewed the data as listed Lab Results  Component Value Date   WBC 5.7 01/19/2020   HGB 13.2 01/19/2020   HCT 38.3 (L) 01/19/2020   MCV 100.5 (H) 01/19/2020   PLT 182 01/19/2020   Recent Labs    05/06/19 1758 01/19/20 1029  NA  --  141  K  --  4.1  CL  --  106  CO2  --  27  GLUCOSE  --  114*  BUN  --  22  CREATININE 1.27* 1.28*  CALCIUM  --  8.6*  GFRNONAA 56* 59*  GFRAA >60  --   PROT  --  6.8  ALBUMIN  --  3.8  AST  --  22  ALT  --  14  ALKPHOS  --  59  BILITOT  --  0.5    RADIOGRAPHIC STUDIES: I have personally reviewed the radiological images as listed and agreed with the findings in the report. No results found.  ASSESSMENT & PLAN:   Cancer of upper lobe of right lung ([facility]) #Squamous cell cancer right upper lobe stage Ia [incidental; T= 3.5 mm].  Clinically noted the recurrence.  However given his extensive smoking history; recommend surveillance imaging on a yearly basis.  #Smoker-quit smoking; recommend continued abstinence.  #COPD-stable.  Continue follow-up with pulmonary.   # DISPOSITION: [id number] Fay-sister.  # CT chest in 1 weeks # follow up TBD- Dr.B  Cc; Burgess Estelle   All questions were answered. The patient knows to call the clinic with any problems, questions or concerns.    Cammie Sickle, MD 01/19/2020 2:52 PM

## 2020-01-27 DIAGNOSIS — F3172 Bipolar disorder, in full remission, most recent episode hypomanic: Secondary | ICD-10-CM | POA: Diagnosis not present

## 2020-01-28 DIAGNOSIS — H353211 Exudative age-related macular degeneration, right eye, with active choroidal neovascularization: Secondary | ICD-10-CM | POA: Diagnosis not present

## 2020-01-29 DIAGNOSIS — F3172 Bipolar disorder, in full remission, most recent episode hypomanic: Secondary | ICD-10-CM | POA: Diagnosis not present

## 2020-01-30 ENCOUNTER — Ambulatory Visit
Admission: RE | Admit: 2020-01-30 | Discharge: 2020-01-30 | Disposition: A | Discharge status: Home / Self Care | Payer: Medicare HMO | Source: Ambulatory Visit | Attending: Internal Medicine | Admitting: Internal Medicine

## 2020-01-30 ENCOUNTER — Other Ambulatory Visit: Payer: Self-pay

## 2020-01-30 DIAGNOSIS — S2231XD Fracture of one rib, right side, subsequent encounter for fracture with routine healing: Secondary | ICD-10-CM | POA: Diagnosis not present

## 2020-01-30 DIAGNOSIS — I2510 Atherosclerotic heart disease of native coronary artery without angina pectoris: Secondary | ICD-10-CM | POA: Diagnosis not present

## 2020-01-30 DIAGNOSIS — C3411 Malignant neoplasm of upper lobe, right bronchus or lung: Secondary | ICD-10-CM | POA: Diagnosis not present

## 2020-01-30 DIAGNOSIS — J432 Centrilobular emphysema: Secondary | ICD-10-CM | POA: Diagnosis not present

## 2020-02-12 ENCOUNTER — Telehealth: Payer: Self-pay | Admitting: Internal Medicine

## 2020-02-12 DIAGNOSIS — C3411 Malignant neoplasm of upper lobe, right bronchus or lung: Secondary | ICD-10-CM

## 2020-02-12 NOTE — Addendum Note (Signed)
Addended by: Gloris Ham on: 02/12/2020 08:56 AM   Modules accepted: Orders

## 2020-02-12 NOTE — Telephone Encounter (Signed)
On 2/2-I called patient's Sister Blenda Mounts to her regarding the results of the CT scan negative for any recurrent malignancy.  Recommend follow-up imaging   C-please schedule follow-up-12 months; MD labs-CBC CMP; CT chest prior.GB

## 2020-02-13 ENCOUNTER — Other Ambulatory Visit: Payer: Self-pay

## 2020-02-13 ENCOUNTER — Encounter: Payer: Self-pay | Admitting: Family Medicine

## 2020-02-13 ENCOUNTER — Ambulatory Visit (INDEPENDENT_AMBULATORY_CARE_PROVIDER_SITE_OTHER): Payer: Medicare HMO | Admitting: Family Medicine

## 2020-02-13 VITALS — BP 122/70 | HR 62 | Temp 98.2°F | Resp 20 | Ht 70.0 in | Wt 150.0 lb

## 2020-02-13 DIAGNOSIS — Z0000 Encounter for general adult medical examination without abnormal findings: Secondary | ICD-10-CM | POA: Diagnosis not present

## 2020-02-13 DIAGNOSIS — E785 Hyperlipidemia, unspecified: Secondary | ICD-10-CM

## 2020-02-13 DIAGNOSIS — Z289 Immunization not carried out for unspecified reason: Secondary | ICD-10-CM | POA: Diagnosis not present

## 2020-02-13 DIAGNOSIS — Z23 Encounter for immunization: Secondary | ICD-10-CM | POA: Diagnosis not present

## 2020-02-13 DIAGNOSIS — Z125 Encounter for screening for malignant neoplasm of prostate: Secondary | ICD-10-CM

## 2020-02-13 MED ORDER — SHINGRIX 50 MCG/0.5ML IM SUSR: 0.5000 mL | Freq: Once | INTRAMUSCULAR | 0 refills | Status: AC

## 2020-02-13 MED ORDER — TETANUS-DIPHTH-ACELL PERTUSSIS 5-2.5-18.5 LF-MCG/0.5 IM SUSY: 0.5000 mL | PREFILLED_SYRINGE | Freq: Once | INTRAMUSCULAR | 0 refills | Status: AC

## 2020-02-13 NOTE — Patient Instructions (Addendum)
.   Please review the attached list of medications and notify my office if there are any errors.   The CDC recommends two doses of Shingrix (the shingles vaccine) separated by 2 to 6 months for adults age 75 years and older. I recommend checking with your pharmacy plan regarding coverage for this vaccine.     You are due for a Tdap (tetanus-diptheria-pertussis vaccine) which protects you from tetanus and whooping cough. Please check with your insurance plan or pharmacy regarding coverage for this vaccine.

## 2020-02-13 NOTE — Progress Notes (Signed)
Annual Wellness Visit     Patient: [name], Male    DOB: [birth year], 75 y.o.   MRN: [id number] Visit Date: 02/13/2020  Today's Provider: Lelon Huh, MD   Chief Complaint  Patient presents with  . Medicare Wellness  . COPD  . Hyperlipidemia   Subjective    [name] is a 75 y.o. male who presents today for his Annual Wellness Visit. He reports consuming a general diet. The patient does not participate in regular exercise at present. He generally feels fairly well. He reports sleeping fairly well. He does not have additional problems to discuss today.   HPI Follow up for COPD:  The patient was last seen for this 11/13/2018. During that visit patient was encouraged to quit smoking.  He reports good compliance with treatment. He feels that condition is Unchanged. He is not having side effects.   -----------------------------------------------------------------------------------------   Lipid/Cholesterol, Follow-up  Last lipid panel Other pertinent labs  Lab Results  Component Value Date   CHOL 160 11/13/2018   HDL 69 11/13/2018   LDLCALC 74 11/13/2018   TRIG 94 11/13/2018   CHOLHDL 2.3 11/13/2018   Lab Results  Component Value Date   ALT 14 01/19/2020   AST 22 01/19/2020   PLT 182 01/19/2020   TSH 2.420 11/13/2018     He was last seen for this 11/13/2018.   Management since that visit includes continuing same medications.  He reports good compliance with treatment. He is not having side effects.   Symptoms: No chest pain No chest pressure/discomfort  No dyspnea No lower extremity edema  No numbness or tingling of extremity No orthopnea  No palpitations No paroxysmal nocturnal dyspnea  No speech difficulty No syncope   Current diet: in general, an "unhealthy" diet Current exercise: none  The 10-year ASCVD risk score Mikey Bussing DC Jr., et al., 2013) is:  19.9%  ---------------------------------------------------------------------------------------------------     Medications: Outpatient Medications Prior to Visit  Medication Sig  . aspirin 81 MG tablet Take 1 tablet (81 mg total) by mouth daily.  . cholecalciferol (VITAMIN D3) 25 MCG (1000 UT) tablet Take 1,000 Units by mouth daily.  . Coenzyme Q10 (COQ10) 100 MG CAPS Take 100 mg by mouth daily.   . divalproex (DEPAKOTE ER) 500 MG 24 hr tablet Take 1,000 mg by mouth at bedtime.   . Omega-3 Fatty Acids (FISH OIL PO) Take 1 capsule by mouth daily.  [name] omeprazole (PRILOSEC) 40 MG capsule TAKE 1 CAPSULE BY MOUTH EVERY DAY  . simvastatin (ZOCOR) 40 MG tablet TAKE 1 TABLET (40 MG TOTAL) BY MOUTH DAILY AT 6 PM.  . vitamin B-12 (CYANOCOBALAMIN) 1000 MCG tablet Take 1,000 mcg by mouth daily.  . ziprasidone (GEODON) 40 MG capsule Take 40 mg by mouth daily with supper.    No facility-administered medications prior to visit.    Allergies  Allergen Reactions  . Ropinirole Hcl Other (See Comments)    Confusion, agiitation, disorientation, Hallucinations  . Tramadol     lethargy    Patient Care Team: Birdie Sons, MD as PCP - General (Family Medicine) Chauncey Mann, MD as Referring Physician (Psychiatry) Cammie Sickle, MD as Consulting Physician (Internal Medicine) [name], MD as Referring Physician (Cardiothoracic Surgery) Jules Husbands, MD as Consulting Physician (General Surgery)  Review of Systems  Constitutional: Negative for appetite change, chills, fatigue and fever.  HENT: Positive for hearing loss and rhinorrhea. Negative for congestion, ear pain, nosebleeds and trouble  swallowing.   Eyes: Negative for pain and visual disturbance.  Respiratory: Positive for shortness of breath. Negative for cough, chest tightness and wheezing.   Cardiovascular: Negative for chest pain, palpitations and leg swelling.  Gastrointestinal: Negative for abdominal pain, blood in stool,  constipation, diarrhea, nausea and vomiting.  Endocrine: Negative for polydipsia, polyphagia and polyuria.  Genitourinary: Negative for dysuria and flank pain.  Musculoskeletal: Positive for back pain. Negative for arthralgias, joint swelling, myalgias and neck stiffness.  Skin: Negative for color change, rash and wound.  Neurological: Negative for dizziness, tremors, seizures, speech difficulty, weakness, light-headedness and headaches.  Psychiatric/Behavioral: Negative for behavioral problems, confusion, decreased concentration, dysphoric mood and sleep disturbance. The patient is not nervous/anxious.   All other systems reviewed and are negative.     Objective    Vitals: BP 122/70 (BP Location: Left Arm, Patient Position: Sitting, Cuff Size: Normal)   Pulse 62   Temp 98.2 F (36.8 C) (Temporal)   Resp 20   Ht 5\' 10"  (1.778 m)   Wt 150 lb (68 kg)   SpO2 98% Comment: room air  BMI 21.52 kg/m    Physical Exam  General: Appearance:    Well developed, well nourished male in no acute distress  Eyes:    PERRL, conjunctiva/corneas clear, EOM's intact       Lungs:     Clear to auscultation bilaterally, respirations unlabored  Heart:    Normal heart rate. Normal rhythm. No murmurs, rubs, or gallops.   MS:   All extremities are intact.   Neurologic:   Awake, alert, oriented x 3. No apparent focal neurological           defect.         Most recent functional status assessment: In your present state of health, do you have any difficulty performing the following activities: 02/13/2020  Hearing? Y  Vision? N  Difficulty concentrating or making decisions? N  Walking or climbing stairs? N  Dressing or bathing? N  Doing errands, shopping? N  Some recent data might be hidden   Most recent fall risk assessment: Fall Risk  02/13/2020  Falls in the past year? 0  Comment -  Number falls in past yr: 0  Injury with Fall? 0  Follow up Falls evaluation completed    Most recent depression  screenings: PHQ 2/9 Scores 02/13/2020 11/13/2018  PHQ - 2 Score 0 0  PHQ- 9 Score 0 0   Most recent cognitive screening: 6CIT Screen 02/13/2020  What Year? 0 points  What month? 0 points  What time? 0 points  Count back from 20 0 points  Months in reverse 0 points  Repeat phrase 4 points  Total Score 4   Most recent Audit-C alcohol use screening Alcohol Use Disorder Test (AUDIT) 02/13/2020  1. How often do you have a drink containing alcohol? 0  2. How many drinks containing alcohol do you have on a typical day when you are drinking? 0  3. How often do you have six or more drinks on one occasion? 0  AUDIT-C Score 0  Alcohol Brief Interventions/Follow-up AUDIT Score <7 follow-up not indicated   A score of 3 or more in women, and 4 or more in men indicates increased risk for alcohol abuse, EXCEPT if all of the points are from question 1   No results found for any visits on 02/13/20.  Assessment & Plan     Annual wellness visit done today including the all of the following:  Reviewed patient's Family Medical History Reviewed and updated list of patient's medical providers Assessment of cognitive impairment was done Assessed patient's functional ability Established a written schedule for health screening [place] Completed and Reviewed  Exercise Activities and Dietary recommendations Goals   None     Immunization History  Administered Date(s) Administered  . Fluad Quad(high Dose 65+) 10/28/2018  . Influenza, High Dose Seasonal PF 10/08/2015, 10/31/2016, 10/27/2017  . Pneumococcal Conjugate-13 07/14/2013  . Pneumococcal Polysaccharide-23 07/04/2011, 01/07/2019, 06/11/2019  . Tdap 06/04/2008  . Zoster 06/08/2009    Health Maintenance  Topic Date Due  . COVID-19 Vaccine (1) Never done  . TETANUS/TDAP  06/05/2018  . INFLUENZA VACCINE  04/08/2020 (Originally 08/10/2019)  . COLONOSCOPY (Pts 45-30yrs Insurance coverage will need to be confirmed)  08/08/2020   . Hepatitis C Screening  Completed  . PNA vac Low Risk Adult  Completed     Discussed health benefits of physical activity, and encouraged him to engage in regular exercise appropriate for his age and condition.    1. Annual physical exam   2. Prostate cancer screening  - PSA Total (Reflex To Free)  3. Hyperlipidemia, unspecified hyperlipidemia type He is tolerating simvastatin well with no adverse effects.   - Lipid panel - TSH  4. Prescription for Shingrix. Vaccine not administered in office.   - Zoster Vaccine Adjuvanted [facility]) injection; Inject 0.5 mLs into the muscle once for 1 dose.  Dispense: 0.5 mL; Refill: 0  5. Prescription for Tdap. Vaccine not administered in office.   - Tdap ([place]) 5-2.5-18.5 LF-MCG/0.5 injection; Inject 0.5 mLs into the muscle once for 1 dose.  Dispense: 0.5 mL; Refill: 0  6. Medicare annual wellness visit, subsequent        The entirety of the information documented in the History of Present Illness, Review of Systems and Physical Exam were personally obtained by me. Portions of this information were initially documented by the CMA and reviewed by me for thoroughness and accuracy.      Lelon Huh, MD  [facility] [phone] (phone) [phone] (fax)  [place]

## 2020-02-14 LAB — TSH: TSH: 2.84 u[IU]/mL (ref 0.450–4.500)

## 2020-02-14 LAB — LIPID PANEL
Chol/HDL Ratio: 2.2 ratio (ref 0.0–5.0)
Cholesterol, Total: 172 mg/dL (ref 100–199)
HDL: 79 mg/dL (ref >39)
LDL Chol Calc (NIH): 78 mg/dL (ref 0–99)
Triglycerides: 82 mg/dL (ref 0–149)
VLDL Cholesterol Cal: 15 mg/dL (ref 5–40)

## 2020-02-14 LAB — PSA TOTAL (REFLEX TO FREE): Prostate Specific Ag, Serum: 2.2 ng/mL (ref 0.0–4.0)

## 2020-02-16 ENCOUNTER — Telehealth: Payer: Self-pay

## 2020-02-16 NOTE — Telephone Encounter (Signed)
-----   Message from Birdie Sons, MD sent at 02/16/2020  9:22 AM EST ----- Labs all very good, Continue current medications.  Check yearly.

## 2020-02-16 NOTE — Telephone Encounter (Signed)
Pt's sister called regarding [name] most recent lab results. Pt's sister was informed that [name]'s lab results were normal and she verbalized understanding.

## 2020-03-03 DIAGNOSIS — H353211 Exudative age-related macular degeneration, right eye, with active choroidal neovascularization: Secondary | ICD-10-CM | POA: Diagnosis not present

## 2020-03-22 DIAGNOSIS — H5203 Hypermetropia, bilateral: Secondary | ICD-10-CM | POA: Diagnosis not present

## 2020-03-22 DIAGNOSIS — H2513 Age-related nuclear cataract, bilateral: Secondary | ICD-10-CM | POA: Diagnosis not present

## 2020-03-22 DIAGNOSIS — H35723 Serous detachment of retinal pigment epithelium, bilateral: Secondary | ICD-10-CM | POA: Diagnosis not present

## 2020-03-22 DIAGNOSIS — H524 Presbyopia: Secondary | ICD-10-CM | POA: Diagnosis not present

## 2020-04-05 DIAGNOSIS — H903 Sensorineural hearing loss, bilateral: Secondary | ICD-10-CM | POA: Diagnosis not present

## 2020-04-07 DIAGNOSIS — H353211 Exudative age-related macular degeneration, right eye, with active choroidal neovascularization: Secondary | ICD-10-CM | POA: Diagnosis not present

## 2020-04-14 DIAGNOSIS — H353121 Nonexudative age-related macular degeneration, left eye, early dry stage: Secondary | ICD-10-CM | POA: Diagnosis not present

## 2020-04-14 DIAGNOSIS — H35363 Drusen (degenerative) of macula, bilateral: Secondary | ICD-10-CM | POA: Diagnosis not present

## 2020-04-14 DIAGNOSIS — H35723 Serous detachment of retinal pigment epithelium, bilateral: Secondary | ICD-10-CM | POA: Diagnosis not present

## 2020-04-14 DIAGNOSIS — H35453 Secondary pigmentary degeneration, bilateral: Secondary | ICD-10-CM | POA: Diagnosis not present

## 2020-04-14 DIAGNOSIS — H353211 Exudative age-related macular degeneration, right eye, with active choroidal neovascularization: Secondary | ICD-10-CM | POA: Diagnosis not present

## 2020-04-14 DIAGNOSIS — H25813 Combined forms of age-related cataract, bilateral: Secondary | ICD-10-CM | POA: Diagnosis not present

## 2020-04-15 DIAGNOSIS — H903 Sensorineural hearing loss, bilateral: Secondary | ICD-10-CM | POA: Diagnosis not present

## 2020-04-28 DIAGNOSIS — F3172 Bipolar disorder, in full remission, most recent episode hypomanic: Secondary | ICD-10-CM | POA: Diagnosis not present

## 2020-05-04 DIAGNOSIS — H353212 Exudative age-related macular degeneration, right eye, with inactive choroidal neovascularization: Secondary | ICD-10-CM | POA: Diagnosis not present

## 2020-05-04 DIAGNOSIS — H25013 Cortical age-related cataract, bilateral: Secondary | ICD-10-CM | POA: Diagnosis not present

## 2020-05-04 DIAGNOSIS — H18413 Arcus senilis, bilateral: Secondary | ICD-10-CM | POA: Diagnosis not present

## 2020-05-04 DIAGNOSIS — H2512 Age-related nuclear cataract, left eye: Secondary | ICD-10-CM | POA: Diagnosis not present

## 2020-05-04 DIAGNOSIS — H25043 Posterior subcapsular polar age-related cataract, bilateral: Secondary | ICD-10-CM | POA: Diagnosis not present

## 2020-05-04 DIAGNOSIS — H2513 Age-related nuclear cataract, bilateral: Secondary | ICD-10-CM | POA: Diagnosis not present

## 2020-05-04 DIAGNOSIS — H353124 Nonexudative age-related macular degeneration, left eye, advanced atrophic with subfoveal involvement: Secondary | ICD-10-CM | POA: Diagnosis not present

## 2020-05-14 DIAGNOSIS — Z961 Presence of intraocular lens: Secondary | ICD-10-CM | POA: Diagnosis not present

## 2020-05-14 DIAGNOSIS — H2511 Age-related nuclear cataract, right eye: Secondary | ICD-10-CM | POA: Diagnosis not present

## 2020-05-14 DIAGNOSIS — H2512 Age-related nuclear cataract, left eye: Secondary | ICD-10-CM | POA: Diagnosis not present

## 2020-05-18 DIAGNOSIS — H353211 Exudative age-related macular degeneration, right eye, with active choroidal neovascularization: Secondary | ICD-10-CM | POA: Diagnosis not present

## 2020-06-04 ENCOUNTER — Other Ambulatory Visit: Payer: Self-pay | Admitting: Family Medicine

## 2020-06-11 DIAGNOSIS — Z961 Presence of intraocular lens: Secondary | ICD-10-CM | POA: Diagnosis not present

## 2020-06-11 DIAGNOSIS — H2511 Age-related nuclear cataract, right eye: Secondary | ICD-10-CM | POA: Diagnosis not present

## 2020-06-18 DIAGNOSIS — H353211 Exudative age-related macular degeneration, right eye, with active choroidal neovascularization: Secondary | ICD-10-CM | POA: Diagnosis not present

## 2020-07-23 DIAGNOSIS — H353211 Exudative age-related macular degeneration, right eye, with active choroidal neovascularization: Secondary | ICD-10-CM | POA: Diagnosis not present

## 2020-08-06 DIAGNOSIS — F3172 Bipolar disorder, in full remission, most recent episode hypomanic: Secondary | ICD-10-CM | POA: Diagnosis not present

## 2020-08-30 DIAGNOSIS — H353211 Exudative age-related macular degeneration, right eye, with active choroidal neovascularization: Secondary | ICD-10-CM | POA: Diagnosis not present

## 2020-10-05 DIAGNOSIS — H353211 Exudative age-related macular degeneration, right eye, with active choroidal neovascularization: Secondary | ICD-10-CM | POA: Diagnosis not present

## 2020-10-29 DIAGNOSIS — F3172 Bipolar disorder, in full remission, most recent episode hypomanic: Secondary | ICD-10-CM | POA: Diagnosis not present

## 2020-11-01 DIAGNOSIS — F3172 Bipolar disorder, in full remission, most recent episode hypomanic: Secondary | ICD-10-CM | POA: Diagnosis not present

## 2020-11-09 DIAGNOSIS — H353211 Exudative age-related macular degeneration, right eye, with active choroidal neovascularization: Secondary | ICD-10-CM | POA: Diagnosis not present

## 2020-11-22 DIAGNOSIS — H35363 Drusen (degenerative) of macula, bilateral: Secondary | ICD-10-CM | POA: Diagnosis not present

## 2020-11-22 DIAGNOSIS — H35723 Serous detachment of retinal pigment epithelium, bilateral: Secondary | ICD-10-CM | POA: Diagnosis not present

## 2020-11-22 DIAGNOSIS — H353231 Exudative age-related macular degeneration, bilateral, with active choroidal neovascularization: Secondary | ICD-10-CM | POA: Diagnosis not present

## 2020-11-22 DIAGNOSIS — H35453 Secondary pigmentary degeneration, bilateral: Secondary | ICD-10-CM | POA: Diagnosis not present

## 2020-12-13 DIAGNOSIS — H353231 Exudative age-related macular degeneration, bilateral, with active choroidal neovascularization: Secondary | ICD-10-CM | POA: Diagnosis not present

## 2021-01-06 ENCOUNTER — Emergency Department
Admission: EM | Admit: 2021-01-06 | Discharge: 2021-01-06 | Disposition: A | Discharge status: Home / Self Care | Payer: Medicare HMO | Attending: Emergency Medicine | Admitting: Emergency Medicine

## 2021-01-06 ENCOUNTER — Telehealth: Payer: Self-pay

## 2021-01-06 ENCOUNTER — Emergency Department: Payer: Medicare HMO

## 2021-01-06 ENCOUNTER — Other Ambulatory Visit: Payer: Self-pay

## 2021-01-06 DIAGNOSIS — Z5321 Procedure and treatment not carried out due to patient leaving prior to being seen by health care provider: Secondary | ICD-10-CM | POA: Insufficient documentation

## 2021-01-06 DIAGNOSIS — R531 Weakness: Secondary | ICD-10-CM | POA: Insufficient documentation

## 2021-01-06 DIAGNOSIS — S0990XA Unspecified injury of head, initial encounter: Secondary | ICD-10-CM | POA: Diagnosis not present

## 2021-01-06 DIAGNOSIS — G319 Degenerative disease of nervous system, unspecified: Secondary | ICD-10-CM | POA: Diagnosis not present

## 2021-01-06 DIAGNOSIS — R296 Repeated falls: Secondary | ICD-10-CM | POA: Diagnosis not present

## 2021-01-06 DIAGNOSIS — F3172 Bipolar disorder, in full remission, most recent episode hypomanic: Secondary | ICD-10-CM | POA: Diagnosis not present

## 2021-01-06 LAB — CBC WITH DIFFERENTIAL/PLATELET
Abs Immature Granulocytes: 0.03 10*3/uL (ref 0.00–0.07)
Basophils Absolute: 0 10*3/uL (ref 0.0–0.1)
Basophils Relative: 1 %
Eosinophils Absolute: 0.1 10*3/uL (ref 0.0–0.5)
Eosinophils Relative: 1 %
HCT: 37.2 % — ABNORMAL LOW (ref 39.0–52.0)
Hemoglobin: 12 g/dL — ABNORMAL LOW (ref 13.0–17.0)
Immature Granulocytes: 1 %
Lymphocytes Relative: 18 %
Lymphs Abs: 1 10*3/uL (ref 0.7–4.0)
MCH: 32.9 pg (ref 26.0–34.0)
MCHC: 32.3 g/dL (ref 30.0–36.0)
MCV: 101.9 fL — ABNORMAL HIGH (ref 80.0–100.0)
Monocytes Absolute: 0.9 10*3/uL (ref 0.1–1.0)
Monocytes Relative: 17 %
Neutro Abs: 3.4 10*3/uL (ref 1.7–7.7)
Neutrophils Relative %: 62 %
Platelets: 147 10*3/uL — ABNORMAL LOW (ref 150–400)
RBC: 3.65 MIL/uL — ABNORMAL LOW (ref 4.22–5.81)
RDW: 13.8 % (ref 11.5–15.5)
WBC: 5.4 10*3/uL (ref 4.0–10.5)
nRBC: 0 % (ref 0.0–0.2)

## 2021-01-06 LAB — COMPREHENSIVE METABOLIC PANEL
ALT: 19 U/L (ref 0–44)
AST: 39 U/L (ref 15–41)
Albumin: 3.7 g/dL (ref 3.5–5.0)
Alkaline Phosphatase: 64 U/L (ref 38–126)
Anion gap: 9 (ref 5–15)
BUN: 18 mg/dL (ref 8–23)
CO2: 26 mmol/L (ref 22–32)
Calcium: 9.1 mg/dL (ref 8.9–10.3)
Chloride: 104 mmol/L (ref 98–111)
Creatinine, Ser: 1.14 mg/dL (ref 0.61–1.24)
GFR, Estimated: >60 mL/min (ref >60)
Glucose, Bld: 89 mg/dL (ref 70–99)
Potassium: 4.1 mmol/L (ref 3.5–5.1)
Sodium: 139 mmol/L (ref 135–145)
Total Bilirubin: 0.7 mg/dL (ref 0.3–1.2)
Total Protein: 7 g/dL (ref 6.5–8.1)

## 2021-01-06 LAB — VALPROIC ACID LEVEL: Valproic Acid Lvl: 61 ug/mL (ref 50.0–100.0)

## 2021-01-06 NOTE — Telephone Encounter (Signed)
FYI

## 2021-01-06 NOTE — Telephone Encounter (Signed)
Copied from [place] [id number]. Topic: General - Inquiry >> Jan 06, 2021  3:56 PM Greggory Keen D wrote: Reason for CRM: Dr. Nicolasa Ducking called asking to speak to Dr. Caryn Section about sending the patient to the ER.  Patient was in her office.  I told her Dr. Caryn Section was not in today.  I called the office to try to connect her with another provider.  Josie at [facility] was in the process of transferring [place] to someone but Dr. Nicolasa Ducking said it was ok and she did'nt need to speak to anyone.  She said she was sending the patient to the ER>

## 2021-01-06 NOTE — ED Triage Notes (Signed)
Pt was sent ehre by doctor for CT scan of head d/t increased falls- pt was sent with a note from the doctor that also states they want his valproic acid level checked- spoke with Dr Cheri Fowler about pt

## 2021-01-17 DIAGNOSIS — H353231 Exudative age-related macular degeneration, bilateral, with active choroidal neovascularization: Secondary | ICD-10-CM | POA: Diagnosis not present

## 2021-01-31 ENCOUNTER — Ambulatory Visit
Admission: RE | Admit: 2021-01-31 | Discharge: 2021-01-31 | Disposition: A | Discharge status: Home / Self Care | Payer: Medicare HMO | Source: Ambulatory Visit | Attending: Internal Medicine | Admitting: Internal Medicine

## 2021-01-31 ENCOUNTER — Other Ambulatory Visit: Payer: Self-pay

## 2021-01-31 DIAGNOSIS — C3411 Malignant neoplasm of upper lobe, right bronchus or lung: Secondary | ICD-10-CM | POA: Insufficient documentation

## 2021-01-31 DIAGNOSIS — S2231XA Fracture of one rib, right side, initial encounter for closed fracture: Secondary | ICD-10-CM | POA: Diagnosis not present

## 2021-01-31 MED ORDER — IOHEXOL 300 MG/ML  SOLN
75.0000 mL | Freq: Once | INTRAMUSCULAR | Status: AC | PRN
  Administered 2021-01-31: 75 mL via INTRAVENOUS

## 2021-02-02 ENCOUNTER — Other Ambulatory Visit: Payer: Self-pay | Admitting: Neurology

## 2021-02-02 ENCOUNTER — Inpatient Hospital Stay: Payer: Medicare HMO | Admitting: Internal Medicine

## 2021-02-02 ENCOUNTER — Other Ambulatory Visit: Payer: Self-pay

## 2021-02-02 ENCOUNTER — Inpatient Hospital Stay: Payer: Medicare HMO | Attending: Internal Medicine

## 2021-02-02 ENCOUNTER — Encounter: Payer: Self-pay | Admitting: Internal Medicine

## 2021-02-02 VITALS — BP 115/87 | HR 84 | Temp 98.4°F | Ht 70.0 in | Wt 146.2 lb

## 2021-02-02 DIAGNOSIS — C3411 Malignant neoplasm of upper lobe, right bronchus or lung: Secondary | ICD-10-CM | POA: Diagnosis not present

## 2021-02-02 DIAGNOSIS — Z113 Encounter for screening for infections with a predominantly sexual mode of transmission: Secondary | ICD-10-CM | POA: Diagnosis not present

## 2021-02-02 DIAGNOSIS — Z87891 Personal history of nicotine dependence: Secondary | ICD-10-CM | POA: Insufficient documentation

## 2021-02-02 DIAGNOSIS — Z79899 Other long term (current) drug therapy: Secondary | ICD-10-CM | POA: Diagnosis not present

## 2021-02-02 DIAGNOSIS — R413 Other amnesia: Secondary | ICD-10-CM | POA: Diagnosis not present

## 2021-02-02 DIAGNOSIS — R251 Tremor, unspecified: Secondary | ICD-10-CM | POA: Diagnosis not present

## 2021-02-02 DIAGNOSIS — R569 Unspecified convulsions: Secondary | ICD-10-CM | POA: Diagnosis not present

## 2021-02-02 DIAGNOSIS — Z7982 Long term (current) use of aspirin: Secondary | ICD-10-CM | POA: Insufficient documentation

## 2021-02-02 DIAGNOSIS — F3012 Manic episode without psychotic symptoms, moderate: Secondary | ICD-10-CM | POA: Diagnosis not present

## 2021-02-02 DIAGNOSIS — E559 Vitamin D deficiency, unspecified: Secondary | ICD-10-CM | POA: Diagnosis not present

## 2021-02-02 LAB — CBC WITH DIFFERENTIAL/PLATELET
Abs Immature Granulocytes: 0.01 10*3/uL (ref 0.00–0.07)
Basophils Absolute: 0 10*3/uL (ref 0.0–0.1)
Basophils Relative: 1 %
Eosinophils Absolute: 0.1 10*3/uL (ref 0.0–0.5)
Eosinophils Relative: 1 %
HCT: 39.8 % (ref 39.0–52.0)
Hemoglobin: 13.2 g/dL (ref 13.0–17.0)
Immature Granulocytes: 0 %
Lymphocytes Relative: 28 %
Lymphs Abs: 1.5 10*3/uL (ref 0.7–4.0)
MCH: 33.7 pg (ref 26.0–34.0)
MCHC: 33.2 g/dL (ref 30.0–36.0)
MCV: 101.5 fL — ABNORMAL HIGH (ref 80.0–100.0)
Monocytes Absolute: 0.4 10*3/uL (ref 0.1–1.0)
Monocytes Relative: 8 %
Neutro Abs: 3.3 10*3/uL (ref 1.7–7.7)
Neutrophils Relative %: 62 %
Platelets: 158 10*3/uL (ref 150–400)
RBC: 3.92 MIL/uL — ABNORMAL LOW (ref 4.22–5.81)
RDW: 13.4 % (ref 11.5–15.5)
WBC: 5.3 10*3/uL (ref 4.0–10.5)
nRBC: 0 % (ref 0.0–0.2)

## 2021-02-02 LAB — COMPREHENSIVE METABOLIC PANEL
ALT: 12 U/L (ref 0–44)
AST: 20 U/L (ref 15–41)
Albumin: 3.9 g/dL (ref 3.5–5.0)
Alkaline Phosphatase: 78 U/L (ref 38–126)
Anion gap: 8 (ref 5–15)
BUN: 24 mg/dL — ABNORMAL HIGH (ref 8–23)
CO2: 28 mmol/L (ref 22–32)
Calcium: 9.3 mg/dL (ref 8.9–10.3)
Chloride: 105 mmol/L (ref 98–111)
Creatinine, Ser: 1.27 mg/dL — ABNORMAL HIGH (ref 0.61–1.24)
GFR, Estimated: 59 mL/min — ABNORMAL LOW (ref >60)
Glucose, Bld: 119 mg/dL — ABNORMAL HIGH (ref 70–99)
Potassium: 4.3 mmol/L (ref 3.5–5.1)
Sodium: 141 mmol/L (ref 135–145)
Total Bilirubin: 0.7 mg/dL (ref 0.3–1.2)
Total Protein: 7.4 g/dL (ref 6.5–8.1)

## 2021-02-02 NOTE — Assessment & Plan Note (Addendum)
#  Squamous cell cancer right upper lobe stage Ia [incidental; T= 3.5 mm].    JAN 23rd-2023-CT scan-no evidence of recurrence; stable right middle lobe nodule; left upper lobe wedge resection site stable/scarring.  #S/p recent fall [evaluation with Dr. Georga Kaufmann MRI of the brain February 3rd.  Incidental rib fractures right side-noted on CT scan reviewed with patient and sister.  #Smoker-quit smoking-counseled quit smoking.  # rise in creatinine- 1.27; GFR- 59- recommend increase fluid inrale  #COPD-stable.  Continue follow-up with pulmonary.  #Incidental findings on  CT Imaging dated: JAN, 2023: Atherosclerosis; no fractures; reflux/Nissen fundoplication I reviewed/discussed/counseled the patient.   # DISPOSITION: [id number] Fay-sister.  # follow up in 12 months- MD; labs- cbc/cmp;CT chest prior-Dr.B  # I reviewed the blood work- with the patient in detail; also reviewed the imaging independently [as summarized above]; and with the patient in detail.

## 2021-02-02 NOTE — Progress Notes (Signed)
[name] NOTE  Patient Care Team: Birdie Sons, MD as PCP - General (Family Medicine) Chauncey Mann, MD as Referring Physician (Psychiatry) Cammie Sickle, MD as Consulting Physician (Internal Medicine) [name], MD (Inactive) as Referring Physician (Cardiothoracic Surgery) Jules Husbands, MD as Consulting Physician (General Surgery)  CHIEF COMPLAINTS/PURPOSE OF CONSULTATION:  Lung cancer  #  Oncology History Overview Note  # DEC 2021- RUL LUNG CANCER- SQUAMOUS CELL CA [pTIa;-3.5 MM squamous cell; s/p wedge resection Dr.Oaks; incidental]- negative margins  # lung RUL wedge resection- [s/p tube]  # > 2010- left lung resection [? Etiology/benign]  DIAGNOSIS: RUL LUNG CA  STAGE:   I      ;  GOALS: cure  CURRENT/MOST RECENT THERAPY : surveillaince       Cancer of upper lobe of right lung ([place])  01/16/2019 Initial Diagnosis   Cancer of upper lobe of right lung (HCC)      HISTORY OF PRESENTING ILLNESS: Ambulating independently.  Accompanied by his sister. [name] 76 y.o.  male prior history of smoking; and history of stage I incidental lung cancer is here for follow-up/review results of the CT scan.  In the interim patient had dizzy spell and fell; s/p evaluation with neurology Dr. Brigitte Pulse.  Awaiting MRI brain.  Patient states to have landed on his right chest.  Notes bruise/pain overall improved.  Denies any unusual chest pain or shortness of breath or cough.  Chronic mild shortness of breath on exertion.  Review of Systems  Constitutional:  Negative for chills, diaphoresis, fever and malaise/fatigue.  HENT:  Negative for nosebleeds and sore throat.   Eyes:  Negative for double vision.  Respiratory:  Positive for cough and shortness of breath. Negative for hemoptysis and wheezing.   Cardiovascular:  Negative for chest pain, palpitations, orthopnea and leg swelling.  Gastrointestinal:  Negative for abdominal pain, blood  in stool, constipation, diarrhea, heartburn, melena, nausea and vomiting.  Genitourinary:  Negative for dysuria, frequency and urgency.  Musculoskeletal:  Negative for back pain and joint pain.  Skin: Negative.  Negative for itching and rash.  Neurological:  Negative for dizziness, tingling, focal weakness, weakness and headaches.  Endo/Heme/Allergies:  Does not bruise/bleed easily.  Psychiatric/Behavioral:  Negative for depression. The patient is not nervous/anxious and does not have insomnia.     MEDICAL HISTORY:  Past Medical History:  Diagnosis Date   Arthritis    in his fingers   Bipolar disorder (HCC)    BPH (benign prostatic hyperplasia)    Cancer ([place]) 2020   LUNG CA   Depression    Emphysema    Episodic confusion    Possible bipolar disorder   GERD (gastroesophageal reflux disease)    History of chicken pox    History of hiatal hernia    HNP (herniated nucleus pulposus), lumbar    Hyperlipidemia    Memory deficit 07/31/2012   OCD (obsessive compulsive disorder)    Peripheral neuropathy    Restless leg syndrome 01/10/2003   Unspecified psychosis 03/01/2012   Wears dentures    full upper and lower   Wears hearing aid in both ears     SURGICAL HISTORY: Past Surgical History:  Procedure Laterality Date   BACK SURGERY     COLONOSCOPY     ESOPHAGOGASTRODUODENOSCOPY     ESOPHAGOGASTRODUODENOSCOPY (EGD) WITH PROPOFOL [name] 02/28/2019   Procedure: ESOPHAGOGASTRODUODENOSCOPY (EGD) WITH DILATION;  Surgeon: Lucilla Lame, MD;  Location: [place];  Service: Endoscopy;  Laterality: [name];  Priority 3   FLEXIBLE BRONCHOSCOPY [name] 01/06/2019   Procedure: FLEXIBLE BRONCHOSCOPY;  Surgeon: [name], MD;  Location: [facility];  Service: Thoracic;  Laterality: [name];   liver excision     for non-cancerous mass   LUMBAR LAMINECTOMY/DECOMPRESSION MICRODISCECTOMY Left 06/22/2017   Procedure: Extraforaminal Microdiscectomy  - L4-L5 - left;  Surgeon: Eustace Moore, MD;  Location: [place];  Service: Neurosurgery;  Laterality: Left;   LUNG SURGERY  2005   Lipoma Removal   MAXIMUM ACCESS (MAS) TRANSFORAMINAL LUMBAR INTERBODY FUSION (TLIF) 1 LEVEL [name] 02/18/2018   Procedure: Transforaminal Lumbar Interbody Fusion - Lumbar Four - Lumbar Five;  Surgeon: Eustace Moore, MD;  Location: [place];  Service: Neurosurgery;  Laterality: [name];  Transforaminal Lumbar Interbody Fusion - Lumbar Four - Lumbar Five   THORACOTOMY Right 01/06/2019   Procedure: THORACOTOMY MAJOR;  Surgeon: [name], MD;  Location: [facility];  Service: Thoracic;  Laterality: Right;   VIDEO ASSISTED THORACOSCOPY (VATS)/WEDGE RESECTION Right 01/06/2019   Procedure: VIDEO ASSISTED THORACOSCOPY (VATS)/WEDGE RESECTION-RUL and RLL-lung resection;  Surgeon: [name], MD;  Location: [facility];  Service: Thoracic;  Laterality: Right;   XI ROBOTIC ASSISTED PARAESOPHAGEAL HERNIA REPAIR [name] 05/06/2019   Procedure: XI ROBOTIC ASSISTED PARAESOPHAGEAL HERNIA REPAIR;  Surgeon: Jules Husbands, MD;  Location: [facility];  Service: General;  Laterality: [name];    SOCIAL HISTORY: Social History   Socioeconomic History   Marital status: Divorced    Spouse name: Not on file   Number of children: 0   Years of education: 11   Highest education level: Not on file  Occupational History   Occupation: Retired  Tobacco Use   Smoking status: Former    Packs/day: 0.75    Years: 50.00    Pack years: 37.50    Types: Cigarettes    Quit date: 11/26/2018    Years since quitting: 2.1   Smokeless tobacco: Never  Vaping Use   Vaping Use: Never used  Substance and Sexual Activity   Alcohol use: No   Drug use: No   Sexual activity: Not on file  Other Topics Concern   Not on file  Social History Narrative   Patient lives at home alone.   Patient is retired.    Patient has no children.    Patient has 11th grade education.    Social Determinants of Health   Financial Resource Strain: Not  on file  Food Insecurity: Not on file  Transportation Needs: Not on file  Physical Activity: Not on file  Stress: Not on file  Social Connections: Not on file  Intimate Partner Violence: Not on file    FAMILY HISTORY: Family History  Problem Relation Age of Onset   Heart disease Mother    Cancer Father        lung   Heart disease Father    Diabetes Sister    Heart disease Sister    Seizures Paternal Grandfather     ALLERGIES:  is allergic to ropinirole hcl and tramadol.  MEDICATIONS:  Current Outpatient Medications  Medication Sig Dispense Refill   aspirin 81 MG tablet Take 1 tablet (81 mg total) by mouth daily.     cholecalciferol (VITAMIN D3) 25 MCG (1000 UT) tablet Take 1,000 Units by mouth daily.     Coenzyme Q10 (COQ10) 100 MG CAPS Take 100 mg by mouth daily.      divalproex (DEPAKOTE ER) 500 MG 24 hr tablet Take 1,000 mg by mouth  at bedtime.      omeprazole (PRILOSEC) 40 MG capsule TAKE 1 CAPSULE BY MOUTH EVERY DAY 90 capsule 2   simvastatin (ZOCOR) 40 MG tablet TAKE 1 TABLET BY MOUTH EVERY DAY AT 6PM 90 tablet 2   vitamin B-12 (CYANOCOBALAMIN) 1000 MCG tablet Take 1,000 mcg by mouth daily.     ziprasidone (GEODON) 40 MG capsule Take 40 mg by mouth daily with supper.      No current facility-administered medications for this visit.      [name]  PHYSICAL EXAMINATION:  Vitals:   02/02/21 1025  BP: 115/87  Pulse: 84  Temp: 98.4 F (36.9 C)  SpO2: 100%   Filed Weights   02/02/21 1025  Weight: 146 lb 3.2 oz (66.3 kg)    Physical Exam Constitutional:      Comments: Frail-appearing elderly male patient.  Accompanied by his sister.  Is walking himself.  HENT:     Head: Normocephalic and atraumatic.     Mouth/Throat:     Pharynx: No oropharyngeal exudate.  Eyes:     Pupils: Pupils are equal, round, and reactive to light.  Cardiovascular:     Rate and Rhythm: Normal rate and regular rhythm.  Pulmonary:     Effort: No respiratory distress.      Breath sounds: No wheezing.  Abdominal:     General: Bowel sounds are normal. There is no distension.     Palpations: Abdomen is soft. There is no mass.     Tenderness: There is no abdominal tenderness. There is no guarding or rebound.  Musculoskeletal:        General: No tenderness. Normal range of motion.     Cervical back: Normal range of motion and neck supple.  Skin:    General: Skin is warm.  Neurological:     Mental Status: He is alert and oriented to person, place, and time.  Psychiatric:        Mood and Affect: Affect normal.     LABORATORY DATA:  I have reviewed the data as listed Lab Results  Component Value Date   WBC 5.3 02/02/2021   HGB 13.2 02/02/2021   HCT 39.8 02/02/2021   MCV 101.5 (H) 02/02/2021   PLT 158 02/02/2021   Recent Labs    01/06/21 1648 02/02/21 1012  NA 139 141  K 4.1 4.3  CL 104 105  CO2 26 28  GLUCOSE 89 119*  BUN 18 24*  CREATININE 1.14 1.27*  CALCIUM 9.1 9.3  GFRNONAA >60 59*  PROT 7.0 7.4  ALBUMIN 3.7 3.9  AST 39 20  ALT 19 12  ALKPHOS 64 78  BILITOT 0.7 0.7    RADIOGRAPHIC STUDIES: I have personally reviewed the radiological images as listed and agreed with the findings in the report. CT HEAD WO CONTRAST (5MM)  Result Date: 01/06/2021 CLINICAL DATA:  Head trauma.  Recent falls. EXAM: CT HEAD WITHOUT CONTRAST TECHNIQUE: Contiguous axial images were obtained from the base of the skull through the vertex without intravenous contrast. COMPARISON:  CT head 06/23/2008 FINDINGS: Brain: Moderate atrophy with significant progression since 2010. Moderate white matter hypodensity diffusely has developed in the interval. Negative for acute infarct, hemorrhage, mass. Vascular: Negative for hyperdense vessel. Skull: Negative Sinuses/Orbits: Mucosal edema paranasal sinuses. Bilateral cataract extraction Other: None IMPRESSION: No acute findings Significant progression of atrophy and chronic microvascular ischemia since 2010. Electronically  Signed   By: Franchot Gallo M.D.   On: 01/06/2021 17:53   CT CHEST W CONTRAST  Addendum Date: 01/31/2021   ADDENDUM REPORT: 01/31/2021 16:39 ADDENDUM: The original report was by Dr. Van Clines. The following addendum is by Dr. Van Clines: ONE OTHER IMPRESSION TO ADD:: ONE OTHER IMPRESSION TO ADD: 4.  New subacute fracture of the right anterior third rib. Electronically Signed   By: Van Clines M.D.   On: 01/31/2021 16:39   Result Date: 01/31/2021 CLINICAL DATA:  Non-small cell lung cancer of the right upper lobe. Shortness of breath. EXAM: CT CHEST WITH CONTRAST TECHNIQUE: Multidetector CT imaging of the chest was performed during intravenous contrast administration. RADIATION DOSE REDUCTION: This exam was performed according to the departmental dose-optimization program which includes automated exposure control, adjustment of the mA and/or kV according to patient size and/or use of iterative reconstruction technique. CONTRAST:  37m OMNIPAQUE IOHEXOL 300 MG/ML  SOLN COMPARISON:  01/30/2020 FINDINGS: Cardiovascular: Coronary, aortic arch, and branch vessel atherosclerotic vascular disease. Mediastinum/Nodes: Mild distal esophageal wall thickening. Fundoplication wrap around the gastroesophageal junction. No pathologic adenopathy identified. Lungs/Pleura: Stable right apical scarring. Centrilobular emphysema. Right middle lobe nodule 8 by 4 by 4 mm (volume = 70 mm^3) on image 115 series 3, formerly 7 by 4 by 4 mm by my measurements. Stable scarring and wedge resections in the right lung. Wedge resection in the left upper lobe is again observed with local architectural distortion along its upper margin, as well as a oval-shaped nodular region along the upper margin measuring 1.4 by 0.9 cm on image 48 of series 3, formerly 1.5 by 0.9 cm by my measurements. Overall the appearance is morphologically very similar to previous. Calcified pleural plaque on the left. Upper Abdomen: Atherosclerosis  is present, including aortoiliac atherosclerotic disease and atherosclerotic plaque in the SMA. Fundoplication wrap noted. No adrenal mass observed. Musculoskeletal: New subacute right anterior third rib fracture, image 61 series 3. Old healed right rib fractures are again observed. IMPRESSION: 1. Essentially stable right middle lobe nodule and stable nodularity along the left upper lobe wedge resection site. Stable scarring along right lung wedge resection sites. No specific evidence of progressive malignancy. 2. Mild distal esophageal wall thickening, esophagitis would be a common cause. The patient does have a fundoplication wrap in place around the gastroesophageal junction. 3. Aortic Atherosclerosis (ICD10-I70.0) and Emphysema (ICD10-J43.9). Coronary and systemic atherosclerosis. Electronically Signed: By: WVan ClinesM.D. On: 01/31/2021 16:36    ASSESSMENT & PLAN:   Cancer of upper lobe of right lung (HCC) #Squamous cell cancer right upper lobe stage Ia [incidental; T= 3.5 mm].    JAN 23rd-2023-CT scan-no evidence of recurrence; stable right middle lobe nodule; left upper lobe wedge resection site stable/scarring.  #S/p recent fall [evaluation with Dr. SGeorga KaufmannMRI of the brain February 3rd.  Incidental rib fractures right side-noted on CT scan reviewed with patient and sister.  #Smoker-quit smoking-counseled quit smoking.  # rise in creatinine- 1.27; GFR- 59- recommend increase fluid inrale  #COPD-stable.  Continue follow-up with pulmonary.  #Incidental findings on  CT Imaging dated: JAN, 2023: Atherosclerosis; no fractures; reflux/Nissen fundoplication I reviewed/discussed/counseled the patient.   # DISPOSITION: [id number]-sister.  # follow up in 12 months- MD; labs- cbc/cmp;CT chest prior-Dr.B  # I reviewed the blood work- with the patient in detail; also reviewed the imaging independently [as summarized above]; and with the patient in detail.      All questions  were answered. The patient knows to call the clinic with any problems, questions or concerns.    GCammie Sickle MD 02/02/2021 11:29 AM

## 2021-02-02 NOTE — Progress Notes (Signed)
Would like CT/scan results done 2 days ago.

## 2021-02-08 LAB — MISC LABCORP TEST (SEND OUT): Labcorp test code: 70789

## 2021-02-10 ENCOUNTER — Other Ambulatory Visit: Payer: Self-pay | Admitting: Family Medicine

## 2021-02-10 ENCOUNTER — Telehealth: Payer: Self-pay | Admitting: *Deleted

## 2021-02-10 NOTE — Telephone Encounter (Signed)
I called and got him scheduled for his yearly physical with Dr Caryn Section for 05/10/2021 at 11:00.

## 2021-02-10 NOTE — Telephone Encounter (Signed)
Requested medication (s) are due for refill today:   Yes  Requested medication (s) are on the active medication list:   Yes  Future visit scheduled:   Yes CPE for 05/10/2021 at 11:00 with Dr. Caryn Section   Last ordered: 06/04/2020 #90, 2 refills  Returned because lab work due however appt for CPE isn't until 05/10/2021.   Dr. Caryn Section to review for refills to last until his appt.   Requested Prescriptions  Pending Prescriptions Disp Refills   simvastatin (ZOCOR) 40 MG tablet [Pharmacy Med Name: SIMVASTATIN 40 MG TABLET] 90 tablet 2    Sig: TAKE 1 TABLET BY MOUTH EVERY DAY AT 6PM     Cardiovascular:  Antilipid - Statins Failed - 02/10/2021 11:49 AM      Failed - Valid encounter within last 12 months    Recent Outpatient Visits           12 months ago Annual physical exam   [facility] Birdie Sons, MD   2 years ago Annual physical exam   [facility] Birdie Sons, MD   3 years ago Annual physical exam   [facility] Birdie Sons, MD   4 years ago Annual physical exam   [facility] Birdie Sons, MD   5 years ago Chronic obstructive pulmonary disease, unspecified COPD type [facility])   [facility] Birdie Sons, MD       Future Appointments             In 2 months Fisher, Kirstie Peri, MD [facility]            Failed - Lipid Panel in normal range within the last 12 months    Cholesterol, Total  Date Value Ref Range Status  02/13/2020 172 100 - 199 mg/dL Final   LDL Chol Calc (NIH)  Date Value Ref Range Status  02/13/2020 78 0 - 99 mg/dL Final   HDL  Date Value Ref Range Status  02/13/2020 79 >39 mg/dL Final   Triglycerides  Date Value Ref Range Status  02/13/2020 82 0 - 149 mg/dL Final         Passed - Patient is not pregnant

## 2021-02-11 ENCOUNTER — Other Ambulatory Visit: Payer: Self-pay | Admitting: Family Medicine

## 2021-02-11 ENCOUNTER — Ambulatory Visit
Admission: RE | Admit: 2021-02-11 | Discharge: 2021-02-11 | Disposition: A | Discharge status: Home / Self Care | Payer: Medicare HMO | Source: Ambulatory Visit | Attending: Neurology | Admitting: Neurology

## 2021-02-11 DIAGNOSIS — R413 Other amnesia: Secondary | ICD-10-CM | POA: Insufficient documentation

## 2021-02-11 DIAGNOSIS — R410 Disorientation, unspecified: Secondary | ICD-10-CM | POA: Diagnosis not present

## 2021-02-19 DIAGNOSIS — R569 Unspecified convulsions: Secondary | ICD-10-CM | POA: Diagnosis not present

## 2021-02-21 DIAGNOSIS — H353231 Exudative age-related macular degeneration, bilateral, with active choroidal neovascularization: Secondary | ICD-10-CM | POA: Diagnosis not present

## 2021-03-28 DIAGNOSIS — H353231 Exudative age-related macular degeneration, bilateral, with active choroidal neovascularization: Secondary | ICD-10-CM | POA: Diagnosis not present

## 2021-04-12 ENCOUNTER — Ambulatory Visit: Payer: Medicare HMO | Attending: Neurology | Admitting: Physical Therapy

## 2021-04-12 ENCOUNTER — Encounter: Payer: Self-pay | Admitting: Physical Therapy

## 2021-04-12 VITALS — HR 75

## 2021-04-12 DIAGNOSIS — R262 Difficulty in walking, not elsewhere classified: Secondary | ICD-10-CM | POA: Insufficient documentation

## 2021-04-12 DIAGNOSIS — R2681 Unsteadiness on feet: Secondary | ICD-10-CM | POA: Diagnosis not present

## 2021-04-12 DIAGNOSIS — M6281 Muscle weakness (generalized): Secondary | ICD-10-CM | POA: Insufficient documentation

## 2021-04-12 NOTE — Therapy (Signed)
[facility] ?[place], [place], [address] ?Phone: [phone]   Fax:  [phone] ? ?Physical Therapy Evaluation ? ?Patient Details  ?Name: [name] ?MRN: [id number] ?Date of Birth: 1945/05/24 ?Referring Provider (PT): Manuella Ghazi, MD ? ? ?Encounter Date: 04/12/2021 ? ? PT End of Session - 04/12/21 2051   ? ? Visit Number 1   ? Number of Visits 17   ? Date for PT Re-Evaluation 06/07/21   ? Authorization Type [employer]   ? PT Start Time 0802   ? PT Stop Time 0855   ? PT Time Calculation (min) 53 min   ? Equipment Utilized During Treatment Gait belt   ? Activity Tolerance Patient tolerated treatment well   ? Behavior During Therapy [facility] for tasks assessed/performed   ? ?  ?  ? ?  ? ? ?Past Medical History:  ?Diagnosis Date  ? Arthritis   ? in his fingers  ? Bipolar disorder ([place])   ? BPH (benign prostatic hyperplasia)   ? Cancer [facility]) 2020  ? LUNG CA  ? Depression   ? Emphysema   ? Episodic confusion   ? Possible bipolar disorder  ? GERD (gastroesophageal reflux disease)   ? History of chicken pox   ? History of hiatal hernia   ? HNP (herniated nucleus pulposus), lumbar   ? Hyperlipidemia   ? Memory deficit 07/31/2012  ? OCD (obsessive compulsive disorder)   ? Peripheral neuropathy   ? Restless leg syndrome 01/10/2003  ? Unspecified psychosis 03/01/2012  ? Wears dentures   ? full upper and lower  ? Wears hearing aid in both ears   ? ? ?Past Surgical History:  ?Procedure Laterality Date  ? BACK SURGERY    ? COLONOSCOPY    ? ESOPHAGOGASTRODUODENOSCOPY    ? ESOPHAGOGASTRODUODENOSCOPY (EGD) WITH PROPOFOL [name] 02/28/2019  ? Procedure: ESOPHAGOGASTRODUODENOSCOPY (EGD) WITH DILATION;  Surgeon: Lucilla Lame, MD;  Location: [place];  Service: Endoscopy;  Laterality: [name];  Priority 3  ? FLEXIBLE BRONCHOSCOPY [name] 01/06/2019  ? Procedure: FLEXIBLE BRONCHOSCOPY;  Surgeon: [name], MD;  Location: [facility];  Service: Thoracic;  Laterality: [name];  ?  liver excision    ? for non-cancerous mass  ? LUMBAR LAMINECTOMY/DECOMPRESSION MICRODISCECTOMY Left 06/22/2017  ? Procedure: Extraforaminal Microdiscectomy  - L4-L5 - left;  Surgeon: Eustace Moore, MD;  Location: [place];  Service: Neurosurgery;  Laterality: Left;  ? LUNG SURGERY  2005  ? Lipoma Removal  ? MAXIMUM ACCESS (MAS) TRANSFORAMINAL LUMBAR INTERBODY FUSION (TLIF) 1 LEVEL [name] 02/18/2018  ? Procedure: Transforaminal Lumbar Interbody Fusion - Lumbar Four - Lumbar Five;  Surgeon: Eustace Moore, MD;  Location: [place];  Service: Neurosurgery;  Laterality: [name];  Transforaminal Lumbar Interbody Fusion - Lumbar Four - Lumbar Five  ? THORACOTOMY Right 01/06/2019  ? Procedure: THORACOTOMY MAJOR;  Surgeon: [name], MD;  Location: [facility];  Service: Thoracic;  Laterality: Right;  ? VIDEO ASSISTED THORACOSCOPY (VATS)/WEDGE RESECTION Right 01/06/2019  ? Procedure: VIDEO ASSISTED THORACOSCOPY (VATS)/WEDGE RESECTION-RUL and RLL-lung resection;  Surgeon: [name], MD;  Location: [facility];  Service: Thoracic;  Laterality: Right;  ? XI ROBOTIC ASSISTED PARAESOPHAGEAL HERNIA REPAIR [name] 05/06/2019  ? Procedure: XI ROBOTIC ASSISTED PARAESOPHAGEAL HERNIA REPAIR;  Surgeon: Jules Husbands, MD;  Location: [facility];  Service: General;  Laterality: [name];  ? ? ?Vitals:  ? 04/12/21 0829  ?Pulse: 75  ?SpO2: 98%  ? ? ? ? Subjective Assessment -  04/12/21 0805   ? ? Subjective "I have been having trouble with my balance for a while. I have to slow walk."   ? Pertinent History 76 yo Male reports imbalance over last several years. He presents to therapy without cane/walker. He reports he is not using any assistive device at this time. He denies any recent falls, but states he has to be careful. He denies any numbness/tingling; He denies any pain; He reports difficulty standing/walking because his "legs give out." He reports not being active at home. He does still mow the yard (uses a riding lawnmower); He occassionally will go to the  grocery store. He is being seen by Dr. Manuella Ghazi (neurologist) for confusion spells. Last episode was in Dec 2022. He is being referred for EEG and MRI to address possible seizure like disorder. He denies any recent spells. He denies any dizziness. PMH significant for Bi-polar disorder, Lung CA (2020, no CA now) does get short of breath sometimes, Hard of hearing (wears hearing aides), history of herniated disc in lumbar spine (no pain currently), Peripheral neuropathy, OCD, memory deficits (Denies any issue), arthritis (mostly hands), GERD, emphysema, BPH;   ? Limitations Standing;Walking   ? How long can you sit comfortably? >1 hour- no difficulty;   ? How long can you stand comfortably? 5-10 min, limited because legs give out.   ? How long can you walk comfortably? a few hundred feet;   ? Diagnostic tests CT scan in Dec 2022 was negative for acute abnormality;   ? Patient Stated Goals "I wish I could walk better."   ? Currently in Pain? No/denies   ? ?  ?  ? ?  ? ? ? ? ? [facility] PT Assessment - 04/12/21 0001   ? ?  ? Assessment  ? Medical Diagnosis Imbalance   ? Referring Provider (PT) Manuella Ghazi, MD   ? Onset Date/Surgical Date --   several years  ? Hand Dominance Left   ? Next MD Visit --   unsure  ? Prior Therapy none   ?  ? Precautions  ? Precautions Fall   ? Required Braces or Orthoses --   none  ?  ? Restrictions  ? Weight Bearing Restrictions No   ?  ? Balance Screen  ? Has the patient fallen in the past 6 months No   ? Has the patient had a decrease in activity level because of a fear of falling?  No   ? Is the patient reluctant to leave their home because of a fear of falling?  No   ?  ? Home Environment  ? Living Environment Private residence   ? Living Arrangements Alone   ? Available Help at Discharge Family;Available PRN/intermittently   ? Type of Home House   ? Home Access Level entry   ? Home Layout --   has 1 step inside  ? Home Equipment None   ?  ? Prior Function  ? Level of Independence Independent with  basic ADLs   Sister does cooking/ he gets someone to clean the house  ? Vocation Retired   ? Leisure watch television (likes westerns)   ?  ? Cognition  ? Overall Cognitive Status Difficult to assess   ?  ? Observation/Other Assessments  ? Observations very nice gentleman, hard of hearing   ? Focus on Therapeutic Outcomes (FOTO)  52%   ?  ? Sensation  ? Light Touch Appears Intact   ?  ? Coordination  ?  Gross Motor Movements are Fluid and Coordinated Yes   ? Fine Motor Movements are Fluid and Coordinated No   ? Finger Nose Finger Test slightly dysmetric   ? Heel Shin Test accurate bilaterally   ?  ? Posture/Postural Control  ? Posture Comments WFL, sits with slumped posture, increased thoracic kyphosis, able to self correct, does have forward head   ?  ? ROM / Strength  ? AROM / PROM / Strength AROM;Strength   ?  ? AROM  ? Overall AROM Comments BUE are WFL, no limitations   ?  ? Strength  ? Overall Strength Comments BUE grossly 4/5   ? Strength Assessment Site Hip;Knee;Ankle   ? Right/Left Hip Right;Left   ? Right Hip Flexion 4/5   ? Right Hip ABduction 4/5   ? Right Hip ADduction 4/5   ? Left Hip Flexion 4/5   ? Left Hip ABduction 4/5   ? Left Hip ADduction 4/5   ? Right/Left Knee Right;Left   ? Right Knee Flexion 5/5   ? Right Knee Extension 5/5   ? Left Knee Flexion 3+/5   ? Left Knee Extension 3+/5   ? Right/Left Ankle Right;Left   ? Right Ankle Dorsiflexion 3+/5   ? Right Ankle Plantar Flexion 3-/5   ? Left Ankle Dorsiflexion 3+/5   ? Left Ankle Plantar Flexion 3-/5   ?  ? Transfers  ? Comments able to transfer sit<>Stand without pushing on arm rests of chair, does report slight difficulty   ?  ? Ambulation/Gait  ? Gait Comments Pt ambulates mod I with wide base of support, short step length, faster cadence with slight imbalance noted, decreased heel/toe walk   ?  ? Standardized Balance Assessment  ? Standardized Balance Assessment Five Times Sit to Stand;10 meter walk test;Mini-BESTest   ? Five times sit to  stand comments  13.2 sec with arms across chest (low risk for falls)   ? 10 Meter Walk 1.08   ?  ? Mini-BESTest  ? Sit To Stand Normal: Comes to stand without use of hands and stabilizes independently.   ? Ri

## 2021-04-19 ENCOUNTER — Ambulatory Visit: Payer: Medicare HMO

## 2021-04-19 DIAGNOSIS — M6281 Muscle weakness (generalized): Secondary | ICD-10-CM | POA: Diagnosis not present

## 2021-04-19 DIAGNOSIS — R2681 Unsteadiness on feet: Secondary | ICD-10-CM

## 2021-04-19 DIAGNOSIS — R262 Difficulty in walking, not elsewhere classified: Secondary | ICD-10-CM | POA: Diagnosis not present

## 2021-04-19 NOTE — Therapy (Signed)
[facility] ?[place], [place], [address] ?Phone: [phone]   Fax:  [phone] ? ?Physical Therapy Treatment ? ?Patient Details  ?Name: [name] ?MRN: [id number] ?Date of Birth: 04-Mar-1945 ?Referring Provider (PT): Manuella Ghazi, MD ? ? ?Encounter Date: 04/19/2021 ? ? PT End of Session - 04/19/21 0800   ? ? Visit Number 2   ? Number of Visits 17   ? Date for PT Re-Evaluation 06/07/21   ? Authorization Type [employer]   ? PT Start Time 0800   ? PT Stop Time 0844   ? PT Time Calculation (min) 44 min   ? Equipment Utilized During Treatment Gait belt   ? Activity Tolerance Patient tolerated treatment well   ? Behavior During Therapy [facility] for tasks assessed/performed   ? ?  ?  ? ?  ? ? ?Past Medical History:  ?Diagnosis Date  ? Arthritis   ? in his fingers  ? Bipolar disorder ([place])   ? BPH (benign prostatic hyperplasia)   ? Cancer [facility]) 2020  ? LUNG CA  ? Depression   ? Emphysema   ? Episodic confusion   ? Possible bipolar disorder  ? GERD (gastroesophageal reflux disease)   ? History of chicken pox   ? History of hiatal hernia   ? HNP (herniated nucleus pulposus), lumbar   ? Hyperlipidemia   ? Memory deficit 07/31/2012  ? OCD (obsessive compulsive disorder)   ? Peripheral neuropathy   ? Restless leg syndrome 01/10/2003  ? Unspecified psychosis 03/01/2012  ? Wears dentures   ? full upper and lower  ? Wears hearing aid in both ears   ? ? ?Past Surgical History:  ?Procedure Laterality Date  ? BACK SURGERY    ? COLONOSCOPY    ? ESOPHAGOGASTRODUODENOSCOPY    ? ESOPHAGOGASTRODUODENOSCOPY (EGD) WITH PROPOFOL [name] 02/28/2019  ? Procedure: ESOPHAGOGASTRODUODENOSCOPY (EGD) WITH DILATION;  Surgeon: Lucilla Lame, MD;  Location: [place];  Service: Endoscopy;  Laterality: [name];  Priority 3  ? FLEXIBLE BRONCHOSCOPY [name] 01/06/2019  ? Procedure: FLEXIBLE BRONCHOSCOPY;  Surgeon: [name], MD;  Location: [facility];  Service: Thoracic;  Laterality: [name];  ?  liver excision    ? for non-cancerous mass  ? LUMBAR LAMINECTOMY/DECOMPRESSION MICRODISCECTOMY Left 06/22/2017  ? Procedure: Extraforaminal Microdiscectomy  - L4-L5 - left;  Surgeon: Eustace Moore, MD;  Location: [place];  Service: Neurosurgery;  Laterality: Left;  ? LUNG SURGERY  2005  ? Lipoma Removal  ? MAXIMUM ACCESS (MAS) TRANSFORAMINAL LUMBAR INTERBODY FUSION (TLIF) 1 LEVEL [name] 02/18/2018  ? Procedure: Transforaminal Lumbar Interbody Fusion - Lumbar Four - Lumbar Five;  Surgeon: Eustace Moore, MD;  Location: [place];  Service: Neurosurgery;  Laterality: [name];  Transforaminal Lumbar Interbody Fusion - Lumbar Four - Lumbar Five  ? THORACOTOMY Right 01/06/2019  ? Procedure: THORACOTOMY MAJOR;  Surgeon: [name], MD;  Location: [facility];  Service: Thoracic;  Laterality: Right;  ? VIDEO ASSISTED THORACOSCOPY (VATS)/WEDGE RESECTION Right 01/06/2019  ? Procedure: VIDEO ASSISTED THORACOSCOPY (VATS)/WEDGE RESECTION-RUL and RLL-lung resection;  Surgeon: [name], MD;  Location: [facility];  Service: Thoracic;  Laterality: Right;  ? XI ROBOTIC ASSISTED PARAESOPHAGEAL HERNIA REPAIR [name] 05/06/2019  ? Procedure: XI ROBOTIC ASSISTED PARAESOPHAGEAL HERNIA REPAIR;  Surgeon: Jules Husbands, MD;  Location: [facility];  Service: General;  Laterality: [name];  ? ? ?There were no vitals filed for this visit. ? ? Subjective Assessment - 04/19/21 0801   ? ?  Subjective Patient reports he is having a hard time walking. No falls or LOB since last session.   ? Pertinent History 76 yo Male reports imbalance over last several years. He presents to therapy without cane/walker. He reports he is not using any assistive device at this time. He denies any recent falls, but states he has to be careful. He denies any numbness/tingling; He denies any pain; He reports difficulty standing/walking because his "legs give out." He reports not being active at home. He does still mow the yard (uses a riding lawnmower); He occassionally will go to the  grocery store. He is being seen by Dr. Manuella Ghazi (neurologist) for confusion spells. Last episode was in Dec 2022. He is being referred for EEG and MRI to address possible seizure like disorder. He denies any recent spells. He denies any dizziness. PMH significant for Bi-polar disorder, Lung CA (2020, no CA now) does get short of breath sometimes, Hard of hearing (wears hearing aides), history of herniated disc in lumbar spine (no pain currently), Peripheral neuropathy, OCD, memory deficits (Denies any issue), arthritis (mostly hands), GERD, emphysema, BPH;   ? Limitations Standing;Walking   ? How long can you sit comfortably? >1 hour- no difficulty;   ? How long can you stand comfortably? 5-10 min, limited because legs give out.   ? How long can you walk comfortably? a few hundred feet;   ? Diagnostic tests CT scan in Dec 2022 was negative for acute abnormality;   ? Patient Stated Goals "I wish I could walk better."   ? Currently in Pain? No/denies   ? ?  ?  ? ?  ? ? ? ? ? ? ?Neuro Re-ed ? ?SLS 30 seconds BUE support each LE ?Tandem stance 30 seconds BUE support each LE ? ?Standing with CGA next to support surface:  ?Airex pad: static stand 30 seconds x 2 trials, noticeable trembling of ankles/LE's with fatigue and challenge to maintain stability ?Airex pad: horizontal head turns 30 seconds scanning room 10x ; cueing for arc of motion  ?Airex pad: vertical head turns 30 seconds, cueing for arc of motion, noticeable sway with upward gaze increasing demand on ankle righting reaction musculature ?Airex pad: one foot on 6" step one foot on airex pad, hold position for 30 seconds, switch legs, 2x each LE; ? ? ?ambulate in hallway: ?-horizontal head turns with cues for reading alphabet from cards 86 ftx 2 sets ?-horizontal head turns with cues for reading number and symbols from cards 86 ft x 2 sets  ?-?red light/green light? for sudden initiation/termination of ambulation with close CGA for carryover to natural environment  2x 86 ft  ? ?TherEx ? ?Standing marches 10x each LE; BUE support  ?10x STS no UE support  ?6" step up/down 10x each LE ; UE support; requires seated rest break between legs.  ?Standing RTB row 15x ? ?Seated: ?Lateral hedgehog step over 15x each LE ?GTB hamstring curl 15x each LE  ? ?Access Code: [email or web address] ?URL: [email or web address] ?Date: 04/19/2021 ?Prepared by: Janna Arch ? ?Exercises ?- Standing Tandem Balance with Counter Support  - 1 x daily - 7 x weekly - 2 sets - 2 reps - 30 hold ?- Standing Single Leg Stance with Counter Support  - 1 x daily - 7 x weekly - 2 sets - 2 reps - 30 hold ?- Standing March with Counter Support  - 1 x daily - 7 x weekly - 2 sets - 10 reps - 5 hold ? ? ?  Pt educated throughout session about proper posture and technique with exercises. Improved exercise technique, movement at target joints, use of target muscles after min to mod verbal, visual, tactile cues. ? ?Vitals monitored throughout session with rest breaks to maintain therapeutic values  ? ?Patient presents with excellent motivation. HEP education and performance for understanding done today with handout given. Step ups are very challenging for patient with patient having shortness of breath and elevated HR. Patient will benefit from skilled physical therapy to increase mobility, strength, and stability.  ? ? ? ? ? ? ? ? ? ? ? ? ? ? ? PT Education - 04/19/21 0759   ? ? Education Details HEP, exercise technique   ? Person(s) Educated Patient   ? Methods Explanation;Demonstration;Tactile cues;Verbal cues   ? Comprehension Verbalized understanding;Returned demonstration;Verbal cues required;Tactile cues required   ? ?  ?  ? ?  ? ? ? PT Short Term Goals - 04/12/21 2053   ? ?  ? PT SHORT TERM GOAL #1  ? Title Patient will be adherent to HEP at least 3x a week to improve mobility and strength.   ? Time 4   ? Period Weeks   ? Status New   ? Target Date 05/10/21   ?  ? PT SHORT TERM GOAL #2  ? Title Patient will be able  to hold SLS for >5 sec to indicate improved safety and mobility when stepping in/out of tub or negotiating steps.   ? Time 4   ? Period Weeks   ? Status New   ? Target Date 05/10/21   ? ?  ?  ? ?  ? ? ? ? P

## 2021-04-21 ENCOUNTER — Ambulatory Visit: Payer: Medicare HMO

## 2021-04-21 DIAGNOSIS — R2681 Unsteadiness on feet: Secondary | ICD-10-CM | POA: Diagnosis not present

## 2021-04-21 DIAGNOSIS — R262 Difficulty in walking, not elsewhere classified: Secondary | ICD-10-CM | POA: Diagnosis not present

## 2021-04-21 DIAGNOSIS — M6281 Muscle weakness (generalized): Secondary | ICD-10-CM | POA: Diagnosis not present

## 2021-04-21 NOTE — Therapy (Signed)
[place] [facility] ?[place], [place], [address] ?Phone: [phone]   Fax:  [phone] ? ?Physical Therapy Treatment ? ?Patient Details  ?Name: [name] ?MRN: [id number] ?Date of Birth: 1945-08-22 ?Referring Provider (PT): Manuella Ghazi, MD ? ? ?Encounter Date: 04/21/2021 ? ? PT End of Session - 04/21/21 0805   ? ? Visit Number 3   ? Number of Visits 17   ? Date for PT Re-Evaluation 06/07/21   ? Authorization Type [employer]   ? PT Start Time 0800   ? PT Stop Time 0844   ? PT Time Calculation (min) 44 min   ? Equipment Utilized During Treatment Gait belt   ? Activity Tolerance Patient tolerated treatment well   ? Behavior During Therapy [facility] for tasks assessed/performed   ? ?  ?  ? ?  ? ? ?Past Medical History:  ?Diagnosis Date  ? Arthritis   ? in his fingers  ? Bipolar disorder ([place])   ? BPH (benign prostatic hyperplasia)   ? Cancer [facility]) 2020  ? LUNG CA  ? Depression   ? Emphysema   ? Episodic confusion   ? Possible bipolar disorder  ? GERD (gastroesophageal reflux disease)   ? History of chicken pox   ? History of hiatal hernia   ? HNP (herniated nucleus pulposus), lumbar   ? Hyperlipidemia   ? Memory deficit 07/31/2012  ? OCD (obsessive compulsive disorder)   ? Peripheral neuropathy   ? Restless leg syndrome 01/10/2003  ? Unspecified psychosis 03/01/2012  ? Wears dentures   ? full upper and lower  ? Wears hearing aid in both ears   ? ? ?Past Surgical History:  ?Procedure Laterality Date  ? BACK SURGERY    ? COLONOSCOPY    ? ESOPHAGOGASTRODUODENOSCOPY    ? ESOPHAGOGASTRODUODENOSCOPY (EGD) WITH PROPOFOL [name] 02/28/2019  ? Procedure: ESOPHAGOGASTRODUODENOSCOPY (EGD) WITH DILATION;  Surgeon: Lucilla Lame, MD;  Location: [place];  Service: Endoscopy;  Laterality: [name];  Priority 3  ? FLEXIBLE BRONCHOSCOPY [name] 01/06/2019  ? Procedure: FLEXIBLE BRONCHOSCOPY;  Surgeon: [name], MD;  Location: [facility];  Service: Thoracic;  Laterality: [name];  ?  liver excision    ? for non-cancerous mass  ? LUMBAR LAMINECTOMY/DECOMPRESSION MICRODISCECTOMY Left 06/22/2017  ? Procedure: Extraforaminal Microdiscectomy  - L4-L5 - left;  Surgeon: Eustace Moore, MD;  Location: [place];  Service: Neurosurgery;  Laterality: Left;  ? LUNG SURGERY  2005  ? Lipoma Removal  ? MAXIMUM ACCESS (MAS) TRANSFORAMINAL LUMBAR INTERBODY FUSION (TLIF) 1 LEVEL [name] 02/18/2018  ? Procedure: Transforaminal Lumbar Interbody Fusion - Lumbar Four - Lumbar Five;  Surgeon: Eustace Moore, MD;  Location: [place];  Service: Neurosurgery;  Laterality: [name];  Transforaminal Lumbar Interbody Fusion - Lumbar Four - Lumbar Five  ? THORACOTOMY Right 01/06/2019  ? Procedure: THORACOTOMY MAJOR;  Surgeon: [name], MD;  Location: [facility];  Service: Thoracic;  Laterality: Right;  ? VIDEO ASSISTED THORACOSCOPY (VATS)/WEDGE RESECTION Right 01/06/2019  ? Procedure: VIDEO ASSISTED THORACOSCOPY (VATS)/WEDGE RESECTION-RUL and RLL-lung resection;  Surgeon: [name], MD;  Location: [facility];  Service: Thoracic;  Laterality: Right;  ? XI ROBOTIC ASSISTED PARAESOPHAGEAL HERNIA REPAIR [name] 05/06/2019  ? Procedure: XI ROBOTIC ASSISTED PARAESOPHAGEAL HERNIA REPAIR;  Surgeon: Jules Husbands, MD;  Location: [facility];  Service: General;  Laterality: [name];  ? ? ?There were no vitals filed for this visit. ? ? Subjective Assessment - 04/21/21 0804   ? ?  Subjective Patient reports he was sore after last session. His niece died yesterday and so was at family for the day.   ? Pertinent History 76 yo Male reports imbalance over last several years. He presents to therapy without cane/walker. He reports he is not using any assistive device at this time. He denies any recent falls, but states he has to be careful. He denies any numbness/tingling; He denies any pain; He reports difficulty standing/walking because his "legs give out." He reports not being active at home. He does still mow the yard (uses a riding lawnmower); He occassionally  will go to the grocery store. He is being seen by Dr. Manuella Ghazi (neurologist) for confusion spells. Last episode was in Dec 2022. He is being referred for EEG and MRI to address possible seizure like disorder. He denies any recent spells. He denies any dizziness. PMH significant for Bi-polar disorder, Lung CA (2020, no CA now) does get short of breath sometimes, Hard of hearing (wears hearing aides), history of herniated disc in lumbar spine (no pain currently), Peripheral neuropathy, OCD, memory deficits (Denies any issue), arthritis (mostly hands), GERD, emphysema, BPH;   ? Limitations Standing;Walking   ? How long can you sit comfortably? >1 hour- no difficulty;   ? How long can you stand comfortably? 5-10 min, limited because legs give out.   ? How long can you walk comfortably? a few hundred feet;   ? Diagnostic tests CT scan in Dec 2022 was negative for acute abnormality;   ? Patient Stated Goals "I wish I could walk better."   ? Currently in Pain? No/denies   ? ?  ?  ? ?  ? ? ? ?Neuro Re-ed ? speed ladder: ?-one foot each square 8x length of // bars ?-lateral stepping 8x length of // bars ? ?[place]ange hurdle: ?-lateral step over and back 10x each direction ?-single limb anterior/posterior step back 10x each LE  ? ?Standing with CGA next to support surface:  ?Airex pad: static stand 30 seconds x 2 trials, noticeable trembling of ankles/LE's with fatigue and challenge to maintain stability ?Airex pad: horizontal head turns 30 seconds scanning room 10x ; cueing for arc of motion  ?Airex pad: vertical head turns 30 seconds, cueing for arc of motion, noticeable sway with upward gaze increasing demand on ankle righting reaction musculature ?Airex pad: one foot on 6" step one foot on airex pad, hold position for 30 seconds, switch legs, 2x each LE; ?  ? ?  ?TherEx ? Nustep Lvl 2 seat position 9; cues for SPM> 60 and breathing techniques, 4 minutes ? ?Ambulate 160 ft then superset with 10 sit to stands; x 2  sets ? ?Seated: ?GTB hamstring curl 15x each LE  ?adduction ball squeezes 10x 3 second holds ?GTB abduction 15x  ? ?HEP review ?Exercises ?- Standing Tandem Balance with Counter Support  - 1 x daily - 7 x weekly - 2 sets - 2 reps - 30 hold ?- Standing Single Leg Stance with Counter Support  - 1 x daily - 7 x weekly - 2 sets - 2 reps - 30 hold ?- Standing March with Counter Support  - 1 x daily - 7 x weekly - 2 sets - 10 reps - 5 hold ?  ?Pt educated throughout session about proper posture and technique with exercises. Improved exercise technique, movement at target joints, use of target muscles after min to mod verbal, visual, tactile cues. ? ?Vitals monitored with rest breaks provided to maintain therapeutic  ranges.  ? ?Patient presents with good motivation throughout physical therapy session. He does require frequent seated rest breaks due to shortness of breath and fatigue. His vitals are monitored to ensure therapeutic range. Patient has poor foot clearance with fatigue and standard step length is challenging to obtain and maintain.  Patient will benefit from skilled physical therapy to increase mobility, strength, and stability. ? ? ? ? ? ? ? ? ? ? ? ? ? ? ? ? ? ? ? ? ? ? PT Education - 04/21/21 0804   ? ? Education Details exercise technique, body mechanics   ? Person(s) Educated Patient   ? Methods Explanation;Demonstration;Tactile cues;Verbal cues   ? Comprehension Verbalized understanding;Verbal cues required;Returned demonstration;Tactile cues required   ? ?  ?  ? ?  ? ? ? PT Short Term Goals - 04/12/21 2053   ? ?  ? PT SHORT TERM GOAL #1  ? Title Patient will be adherent to HEP at least 3x a week to improve mobility and strength.   ? Time 4   ? Period Weeks   ? Status New   ? Target Date 05/10/21   ?  ? PT SHORT TERM GOAL #2  ? Title Patient will be able to hold SLS for >5 sec to indicate improved safety and mobility when stepping in/out of tub or negotiating steps.   ? Time 4   ? Period Weeks   ? Status  New   ? Target Date 05/10/21   ? ?  ?  ? ?  ? ? ? ? PT Long Term Goals - 04/12/21 2054   ? ?  ? PT LONG TERM GOAL #1  ? Title Patient will improve balance as evidenced by increasing Mini Best test to >22/28 to reduce

## 2021-04-26 ENCOUNTER — Ambulatory Visit: Payer: Medicare HMO

## 2021-04-26 DIAGNOSIS — R262 Difficulty in walking, not elsewhere classified: Secondary | ICD-10-CM | POA: Diagnosis not present

## 2021-04-26 DIAGNOSIS — R2681 Unsteadiness on feet: Secondary | ICD-10-CM

## 2021-04-26 DIAGNOSIS — M6281 Muscle weakness (generalized): Secondary | ICD-10-CM | POA: Diagnosis not present

## 2021-04-26 NOTE — Therapy (Signed)
[place] [facility] ?[place], [place], [address] ?Phone: [phone]   Fax:  [phone] ? ?Physical Therapy Treatment ? ?Patient Details  ?Name: [name] ?MRN: [id number] ?Date of Birth: 12-Oct-1945 ?Referring Provider (PT): Manuella Ghazi, MD ? ? ?Encounter Date: 04/26/2021 ? ? PT End of Session - 04/26/21 0753   ? ? Visit Number 4   ? Number of Visits 17   ? Date for PT Re-Evaluation 06/07/21   ? Authorization Type [employer]   ? PT Start Time [id number]   ? PT Stop Time 0844   ? PT Time Calculation (min) 45 min   ? Equipment Utilized During Treatment Gait belt   ? Activity Tolerance Patient tolerated treatment well   ? Behavior During Therapy [facility] for tasks assessed/performed   ? ?  ?  ? ?  ? ? ?Past Medical History:  ?Diagnosis Date  ? Arthritis   ? in his fingers  ? Bipolar disorder ([place])   ? BPH (benign prostatic hyperplasia)   ? Cancer [facility]) 2020  ? LUNG CA  ? Depression   ? Emphysema   ? Episodic confusion   ? Possible bipolar disorder  ? GERD (gastroesophageal reflux disease)   ? History of chicken pox   ? History of hiatal hernia   ? HNP (herniated nucleus pulposus), lumbar   ? Hyperlipidemia   ? Memory deficit 07/31/2012  ? OCD (obsessive compulsive disorder)   ? Peripheral neuropathy   ? Restless leg syndrome 01/10/2003  ? Unspecified psychosis 03/01/2012  ? Wears dentures   ? full upper and lower  ? Wears hearing aid in both ears   ? ? ?Past Surgical History:  ?Procedure Laterality Date  ? BACK SURGERY    ? COLONOSCOPY    ? ESOPHAGOGASTRODUODENOSCOPY    ? ESOPHAGOGASTRODUODENOSCOPY (EGD) WITH PROPOFOL [name] 02/28/2019  ? Procedure: ESOPHAGOGASTRODUODENOSCOPY (EGD) WITH DILATION;  Surgeon: Lucilla Lame, MD;  Location: [place];  Service: Endoscopy;  Laterality: [name];  Priority 3  ? FLEXIBLE BRONCHOSCOPY [name] 01/06/2019  ? Procedure: FLEXIBLE BRONCHOSCOPY;  Surgeon: [name], MD;  Location: [facility];  Service: Thoracic;  Laterality: [name];  ?  liver excision    ? for non-cancerous mass  ? LUMBAR LAMINECTOMY/DECOMPRESSION MICRODISCECTOMY Left 06/22/2017  ? Procedure: Extraforaminal Microdiscectomy  - L4-L5 - left;  Surgeon: Eustace Moore, MD;  Location: [place];  Service: Neurosurgery;  Laterality: Left;  ? LUNG SURGERY  2005  ? Lipoma Removal  ? MAXIMUM ACCESS (MAS) TRANSFORAMINAL LUMBAR INTERBODY FUSION (TLIF) 1 LEVEL [name] 02/18/2018  ? Procedure: Transforaminal Lumbar Interbody Fusion - Lumbar Four - Lumbar Five;  Surgeon: Eustace Moore, MD;  Location: [place];  Service: Neurosurgery;  Laterality: [name];  Transforaminal Lumbar Interbody Fusion - Lumbar Four - Lumbar Five  ? THORACOTOMY Right 01/06/2019  ? Procedure: THORACOTOMY MAJOR;  Surgeon: [name], MD;  Location: [facility];  Service: Thoracic;  Laterality: Right;  ? VIDEO ASSISTED THORACOSCOPY (VATS)/WEDGE RESECTION Right 01/06/2019  ? Procedure: VIDEO ASSISTED THORACOSCOPY (VATS)/WEDGE RESECTION-RUL and RLL-lung resection;  Surgeon: [name], MD;  Location: [facility];  Service: Thoracic;  Laterality: Right;  ? XI ROBOTIC ASSISTED PARAESOPHAGEAL HERNIA REPAIR [name] 05/06/2019  ? Procedure: XI ROBOTIC ASSISTED PARAESOPHAGEAL HERNIA REPAIR;  Surgeon: Jules Husbands, MD;  Location: [facility];  Service: General;  Laterality: [name];  ? ? ?There were no vitals filed for this visit. ? ? Subjective Assessment - 04/26/21 0759   ? ?  Subjective Patient reports compliance with HEP. No falls or LOB since last session. Had a good weekend.   ? Pertinent History 76 yo Male reports imbalance over last several years. He presents to therapy without cane/walker. He reports he is not using any assistive device at this time. He denies any recent falls, but states he has to be careful. He denies any numbness/tingling; He denies any pain; He reports difficulty standing/walking because his "legs give out." He reports not being active at home. He does still mow the yard (uses a riding lawnmower); He occassionally will go to the  grocery store. He is being seen by Dr. Manuella Ghazi (neurologist) for confusion spells. Last episode was in Dec 2022. He is being referred for EEG and MRI to address possible seizure like disorder. He denies any recent spells. He denies any dizziness. PMH significant for Bi-polar disorder, Lung CA (2020, no CA now) does get short of breath sometimes, Hard of hearing (wears hearing aides), history of herniated disc in lumbar spine (no pain currently), Peripheral neuropathy, OCD, memory deficits (Denies any issue), arthritis (mostly hands), GERD, emphysema, BPH;   ? Limitations Standing;Walking   ? How long can you sit comfortably? >1 hour- no difficulty;   ? How long can you stand comfortably? 5-10 min, limited because legs give out.   ? How long can you walk comfortably? a few hundred feet;   ? Diagnostic tests CT scan in Dec 2022 was negative for acute abnormality;   ? Patient Stated Goals "I wish I could walk better."   ? Currently in Pain? No/denies   ? ?  ?  ? ?  ? ? ? ? ? ? ?  ?Neuro Re-ed ? speed ladder: ?-one foot each square 10x length of // bars; cue for heel toe patterning of ambulation ?-lateral stepping 8x length of // bars ?  ?Orange hurdle: ?-single limb anterior/posterior step back 10x each LE  ? ?  ?ambulate in hallway: ?-horizontal head turns with cues for reading alphabet from cards 86 ftx 2 sets ?-horizontal head turns with cues for reading number and symbols from cards 86 ft x 2 sets  ?-?red light/green light? for sudden initiation/termination of ambulation with close CGA for carryover to natural environment 2x 86 ft  ? ?Weave between 8 cones x 4 trials; cueing for sequencing and not scissoring.  ? ?Seated: ?Opposite UE/LE raise 10x each side;  ?  ?TherEx ? Nustep Lvl 3 seat position 9; cues for SPM> 60 and breathing techniques, 4 minutes ?  ?Ambulate with focus on heel strike 80 ft x2 trials ? ?Ambulate 160 ft then superset with 10 sit to stands; x 2 sets ?  ?Seated: ?GTB hamstring curl 15x each LE   ?GTB abduction 15x  ?GTB march 10x each LE ?GTB around ankles: alternating LAQ 10x each LE  ? ?Pt educated throughout session about proper posture and technique with exercises. Improved exercise technique, movement at target joints, use of target muscles after min to mod verbal, visual, tactile cues. ? ?Patient presents with excellent motivation throughout physical therapy session. He requires CGA with ambulation tasks due to intermittent instability. His ability to perform heel toe ambulation improved with repetition and cueing for sequencing. Patient will benefit from skilled physical therapy to increase mobility, strength, and stability. ? ? ? ? ? ? ? ? ? ? ? ? ? ? ? ? ? ? ? ? PT Education - 04/26/21 0752   ? ? Education Details exercise technique, body mechanics   ?  Person(s) Educated Patient   ? Methods Explanation;Demonstration;Verbal cues;Tactile cues   ? Comprehension Verbalized understanding;Returned demonstration;Verbal cues required;Tactile cues required   ? ?  ?  ? ?  ? ? ? PT Short Term Goals - 04/12/21 2053   ? ?  ? PT SHORT TERM GOAL #1  ? Title Patient will be adherent to HEP at least 3x a week to improve mobility and strength.   ? Time 4   ? Period Weeks   ? Status New   ? Target Date 05/10/21   ?  ? PT SHORT TERM GOAL #2  ? Title Patient will be able to hold SLS for >5 sec to indicate improved safety and mobility when stepping in/out of tub or negotiating steps.   ? Time 4   ? Period Weeks   ? Status New   ? Target Date 05/10/21   ? ?  ?  ? ?  ? ? ? ? PT Long Term Goals - 04/12/21 2054   ? ?  ? PT LONG TERM GOAL #1  ? Title Patient will improve balance as evidenced by increasing Mini Best test to >22/28 to reduce fall risk.   ? Time 8   ? Period Weeks   ? Status New   ? Target Date 06/07/21   ?  ? PT LONG TERM GOAL #2  ? Title Patient will ambulate independently with normal base of support and reciprocal gait pattern for better gait ability at home and in community.   ? Time 8   ? Period Weeks   ?  Status New   ? Target Date 06/07/21   ?  ? PT LONG TERM GOAL #3  ? Title Patient will improve FOTO survey score by at least 5% to indicate improved functional mobility with ADLs.   ? Time 8   ? Period

## 2021-04-28 ENCOUNTER — Ambulatory Visit: Payer: Medicare HMO

## 2021-04-28 DIAGNOSIS — R2681 Unsteadiness on feet: Secondary | ICD-10-CM

## 2021-04-28 DIAGNOSIS — M6281 Muscle weakness (generalized): Secondary | ICD-10-CM

## 2021-04-28 DIAGNOSIS — R262 Difficulty in walking, not elsewhere classified: Secondary | ICD-10-CM

## 2021-04-28 NOTE — Therapy (Signed)
[place] [facility] ?[place], [place], [address] ?Phone: [phone]   Fax:  [phone] ? ?Physical Therapy Treatment ? ?Patient Details  ?Name: [name] ?MRN: [id number] ?Date of Birth: 12-Feb-1945 ?Referring Provider (PT): Manuella Ghazi, MD ? ? ?Encounter Date: 04/28/2021 ? ? PT End of Session - 04/28/21 0809   ? ? Visit Number 5   ? Number of Visits 17   ? Date for PT Re-Evaluation 06/07/21   ? Authorization Type [employer]   ? PT Start Time 0802   ? PT Stop Time 0844   ? PT Time Calculation (min) 42 min   ? Equipment Utilized During Treatment Gait belt   ? Activity Tolerance Patient tolerated treatment well   ? Behavior During Therapy [facility] for tasks assessed/performed   ? ?  ?  ? ?  ? ? ?Past Medical History:  ?Diagnosis Date  ? Arthritis   ? in his fingers  ? Bipolar disorder ([place])   ? BPH (benign prostatic hyperplasia)   ? Cancer [facility]) 2020  ? LUNG CA  ? Depression   ? Emphysema   ? Episodic confusion   ? Possible bipolar disorder  ? GERD (gastroesophageal reflux disease)   ? History of chicken pox   ? History of hiatal hernia   ? HNP (herniated nucleus pulposus), lumbar   ? Hyperlipidemia   ? Memory deficit 07/31/2012  ? OCD (obsessive compulsive disorder)   ? Peripheral neuropathy   ? Restless leg syndrome 01/10/2003  ? Unspecified psychosis 03/01/2012  ? Wears dentures   ? full upper and lower  ? Wears hearing aid in both ears   ? ? ?Past Surgical History:  ?Procedure Laterality Date  ? BACK SURGERY    ? COLONOSCOPY    ? ESOPHAGOGASTRODUODENOSCOPY    ? ESOPHAGOGASTRODUODENOSCOPY (EGD) WITH PROPOFOL [name] 02/28/2019  ? Procedure: ESOPHAGOGASTRODUODENOSCOPY (EGD) WITH DILATION;  Surgeon: Lucilla Lame, MD;  Location: [place];  Service: Endoscopy;  Laterality: [name];  Priority 3  ? FLEXIBLE BRONCHOSCOPY [name] 01/06/2019  ? Procedure: FLEXIBLE BRONCHOSCOPY;  Surgeon: [name], MD;  Location: [facility];  Service: Thoracic;  Laterality: [name];  ?  liver excision    ? for non-cancerous mass  ? LUMBAR LAMINECTOMY/DECOMPRESSION MICRODISCECTOMY Left 06/22/2017  ? Procedure: Extraforaminal Microdiscectomy  - L4-L5 - left;  Surgeon: Eustace Moore, MD;  Location: [place];  Service: Neurosurgery;  Laterality: Left;  ? LUNG SURGERY  2005  ? Lipoma Removal  ? MAXIMUM ACCESS (MAS) TRANSFORAMINAL LUMBAR INTERBODY FUSION (TLIF) 1 LEVEL [name] 02/18/2018  ? Procedure: Transforaminal Lumbar Interbody Fusion - Lumbar Four - Lumbar Five;  Surgeon: Eustace Moore, MD;  Location: [place];  Service: Neurosurgery;  Laterality: [name];  Transforaminal Lumbar Interbody Fusion - Lumbar Four - Lumbar Five  ? THORACOTOMY Right 01/06/2019  ? Procedure: THORACOTOMY MAJOR;  Surgeon: [name], MD;  Location: [facility];  Service: Thoracic;  Laterality: Right;  ? VIDEO ASSISTED THORACOSCOPY (VATS)/WEDGE RESECTION Right 01/06/2019  ? Procedure: VIDEO ASSISTED THORACOSCOPY (VATS)/WEDGE RESECTION-RUL and RLL-lung resection;  Surgeon: [name], MD;  Location: [facility];  Service: Thoracic;  Laterality: Right;  ? XI ROBOTIC ASSISTED PARAESOPHAGEAL HERNIA REPAIR [name] 05/06/2019  ? Procedure: XI ROBOTIC ASSISTED PARAESOPHAGEAL HERNIA REPAIR;  Surgeon: Jules Husbands, MD;  Location: [facility];  Service: General;  Laterality: [name];  ? ? ?There were no vitals filed for this visit. ? ? Subjective Assessment - 04/28/21 0805   ? ?  Subjective Pt reports he is doing well and has not had a fall or LOB since the last session.  Pt notes his HEP has been going fair.  He notes performing STS has been somewhat difficult.   ? Pertinent History 76 yo Male reports imbalance over last several years. He presents to therapy without cane/walker. He reports he is not using any assistive device at this time. He denies any recent falls, but states he has to be careful. He denies any numbness/tingling; He denies any pain; He reports difficulty standing/walking because his "legs give out." He reports not being active at home. He  does still mow the yard (uses a riding lawnmower); He occassionally will go to the grocery store. He is being seen by Dr. Manuella Ghazi (neurologist) for confusion spells. Last episode was in Dec 2022. He is being referred for EEG and MRI to address possible seizure like disorder. He denies any recent spells. He denies any dizziness. PMH significant for Bi-polar disorder, Lung CA (2020, no CA now) does get short of breath sometimes, Hard of hearing (wears hearing aides), history of herniated disc in lumbar spine (no pain currently), Peripheral neuropathy, OCD, memory deficits (Denies any issue), arthritis (mostly hands), GERD, emphysema, BPH;   ? Limitations Standing;Walking   ? How long can you sit comfortably? >1 hour- no difficulty;   ? How long can you stand comfortably? 5-10 min, limited because legs give out.   ? How long can you walk comfortably? a few hundred feet;   ? Diagnostic tests CT scan in Dec 2022 was negative for acute abnormality;   ? Patient Stated Goals "I wish I could walk better."   ? ?  ?  ? ?  ? ? ? ? ? ?Neuro Re-ed ? ?Orange hurdle: ?-single limb anterior/posterior step back 10x each LE with close CGA  ? ?Ambulate in hallway: ?-horizontal head turns with cues for reading alphabet from cards 86 ftx 2 sets ?-horizontal head turns with cues for reading number and symbols from cards 86 ft x 2 sets  ?-?red light/green light? for sudden initiation/termination of ambulation with close CGA for carryover to natural environment 2x 86 ft  ? ?Seated: ?Opposite UE/LE raise 10x each side;  ? ? ? ?TherEx ? ?Nustep Lvl 3 seat position 9; cues for SPM> 60 and breathing techniques, 4 minutes ?Standing hip abduction with 4# AW, 2x10 ?Standing hip extension with 4# AW, 2x10; pt needing verbal and visual cuing for proper technique ?Ambulate 160 ft then superset with 10 sit to stands; x 2 sets ? ?Seated: ?GTB hamstring curl 2x15 each LE  ?GTB abduction 2x15  ?GTB march 2x10 each LE ?4# AW around ankles: alternating LAQ  2x10 each LE  ? ? ? ?Pt educated throughout session about proper posture and technique with exercises. Improved exercise technique, movement at target joints, use of target muscles after min to mod verbal, visual, tactile cues. ? ? ? ? ? ? ? ? ? ? ? ? PT Short Term Goals - 04/12/21 2053   ? ?  ? PT SHORT TERM GOAL #1  ? Title Patient will be adherent to HEP at least 3x a week to improve mobility and strength.   ? Time 4   ? Period Weeks   ? Status New   ? Target Date 05/10/21   ?  ? PT SHORT TERM GOAL #2  ? Title Patient will be able to hold SLS for >5 sec to indicate improved safety and mobility when  stepping in/out of tub or negotiating steps.   ? Time 4   ? Period Weeks   ? Status New   ? Target Date 05/10/21   ? ?  ?  ? ?  ? ? ? ? PT Long Term Goals - 04/12/21 2054   ? ?  ? PT LONG TERM GOAL #1  ? Title Patient will improve balance as evidenced by increasing Mini Best test to >22/28 to reduce fall risk.   ? Time 8   ? Period Weeks   ? Status New   ? Target Date 06/07/21   ?  ? PT LONG TERM GOAL #2  ? Title Patient will ambulate independently with normal base of support and reciprocal gait pattern for better gait ability at home and in community.   ? Time 8   ? Period Weeks   ? Status New   ? Target Date 06/07/21   ?  ? PT LONG TERM GOAL #3  ? Title Patient will improve FOTO survey score by at least 5% to indicate improved functional mobility with ADLs.   ? Time 8   ? Period Weeks   ? Status New   ? Target Date 06/07/21   ?  ? PT LONG TERM GOAL #4  ? Title Patient will be able to ambulate 1000 feet in 6 min walk to indicate community ambulator for increased mobility.   ? Time 8   ? Period Weeks   ? Status New   ? Target Date 06/07/21   ?  ? PT LONG TERM GOAL #5  ? Title Patient will improve BLE gross strength to at least 4+/5 to indicate improve functional strength with ADLs.   ? Time 8   ? Period Weeks   ? Status New   ? Target Date 06/07/21   ? ?  ?  ? ?  ? ? ? ? ? ? ? ? Plan - 04/28/21 0909   ? ? Clinical  Impression Statement Pt performed well, requiring frequent rest breaks needed due to pt having SOB and also elevated HR (>120).  With education on pursed lip breathing, HR returned to WNL fairly quickly.

## 2021-05-02 DIAGNOSIS — H353231 Exudative age-related macular degeneration, bilateral, with active choroidal neovascularization: Secondary | ICD-10-CM | POA: Diagnosis not present

## 2021-05-03 ENCOUNTER — Ambulatory Visit: Payer: Medicare HMO

## 2021-05-03 DIAGNOSIS — M6281 Muscle weakness (generalized): Secondary | ICD-10-CM | POA: Diagnosis not present

## 2021-05-03 DIAGNOSIS — R262 Difficulty in walking, not elsewhere classified: Secondary | ICD-10-CM | POA: Diagnosis not present

## 2021-05-03 DIAGNOSIS — R2681 Unsteadiness on feet: Secondary | ICD-10-CM | POA: Diagnosis not present

## 2021-05-03 NOTE — Therapy (Signed)
[facility] ?[place], [place], [address] ?Phone: [phone]   Fax:  [phone] ? ?Physical Therapy Treatment ? ?Patient Details  ?Name: [name] ?MRN: [id number] ?Date of Birth: March 25, 1945 ?Referring Provider (PT): Manuella Ghazi, MD ? ? ?Encounter Date: 05/03/2021 ? ? PT End of Session - 05/03/21 0803   ? ? Visit Number 6   ? Number of Visits 17   ? Date for PT Re-Evaluation 06/07/21   ? Authorization Type [employer]   ? PT Start Time 0800   ? PT Stop Time 0844   ? PT Time Calculation (min) 44 min   ? Equipment Utilized During Treatment Gait belt   ? Activity Tolerance Patient tolerated treatment well   ? Behavior During Therapy [facility] for tasks assessed/performed   ? ?  ?  ? ?  ? ? ?Past Medical History:  ?Diagnosis Date  ? Arthritis   ? in his fingers  ? Bipolar disorder ([place])   ? BPH (benign prostatic hyperplasia)   ? Cancer [facility]) 2020  ? LUNG CA  ? Depression   ? Emphysema   ? Episodic confusion   ? Possible bipolar disorder  ? GERD (gastroesophageal reflux disease)   ? History of chicken pox   ? History of hiatal hernia   ? HNP (herniated nucleus pulposus), lumbar   ? Hyperlipidemia   ? Memory deficit 07/31/2012  ? OCD (obsessive compulsive disorder)   ? Peripheral neuropathy   ? Restless leg syndrome 01/10/2003  ? Unspecified psychosis 03/01/2012  ? Wears dentures   ? full upper and lower  ? Wears hearing aid in both ears   ? ? ?Past Surgical History:  ?Procedure Laterality Date  ? BACK SURGERY    ? COLONOSCOPY    ? ESOPHAGOGASTRODUODENOSCOPY    ? ESOPHAGOGASTRODUODENOSCOPY (EGD) WITH PROPOFOL [name] 02/28/2019  ? Procedure: ESOPHAGOGASTRODUODENOSCOPY (EGD) WITH DILATION;  Surgeon: Lucilla Lame, MD;  Location: [place];  Service: Endoscopy;  Laterality: [name];  Priority 3  ? FLEXIBLE BRONCHOSCOPY [name] 01/06/2019  ? Procedure: FLEXIBLE BRONCHOSCOPY;  Surgeon: [name], MD;  Location: [facility];  Service: Thoracic;  Laterality: [name];  ?  liver excision    ? for non-cancerous mass  ? LUMBAR LAMINECTOMY/DECOMPRESSION MICRODISCECTOMY Left 06/22/2017  ? Procedure: Extraforaminal Microdiscectomy  - L4-L5 - left;  Surgeon: Eustace Moore, MD;  Location: [place];  Service: Neurosurgery;  Laterality: Left;  ? LUNG SURGERY  2005  ? Lipoma Removal  ? MAXIMUM ACCESS (MAS) TRANSFORAMINAL LUMBAR INTERBODY FUSION (TLIF) 1 LEVEL [name] 02/18/2018  ? Procedure: Transforaminal Lumbar Interbody Fusion - Lumbar Four - Lumbar Five;  Surgeon: Eustace Moore, MD;  Location: [place];  Service: Neurosurgery;  Laterality: [name];  Transforaminal Lumbar Interbody Fusion - Lumbar Four - Lumbar Five  ? THORACOTOMY Right 01/06/2019  ? Procedure: THORACOTOMY MAJOR;  Surgeon: [name], MD;  Location: [facility];  Service: Thoracic;  Laterality: Right;  ? VIDEO ASSISTED THORACOSCOPY (VATS)/WEDGE RESECTION Right 01/06/2019  ? Procedure: VIDEO ASSISTED THORACOSCOPY (VATS)/WEDGE RESECTION-RUL and RLL-lung resection;  Surgeon: [name], MD;  Location: [facility];  Service: Thoracic;  Laterality: Right;  ? XI ROBOTIC ASSISTED PARAESOPHAGEAL HERNIA REPAIR [name] 05/06/2019  ? Procedure: XI ROBOTIC ASSISTED PARAESOPHAGEAL HERNIA REPAIR;  Surgeon: Jules Husbands, MD;  Location: [facility];  Service: General;  Laterality: [name];  ? ? ?There were no vitals filed for this visit. ? ? Subjective Assessment - 05/03/21 0802   ? ?  Subjective Patient reports he fell once, tripped over something. Has been compliant with HEP. Is feeling sore today.   ? Pertinent History 76 yo Male reports imbalance over last several years. He presents to therapy without cane/walker. He reports he is not using any assistive device at this time. He denies any recent falls, but states he has to be careful. He denies any numbness/tingling; He denies any pain; He reports difficulty standing/walking because his "legs give out." He reports not being active at home. He does still mow the yard (uses a riding lawnmower); He occassionally  will go to the grocery store. He is being seen by Dr. Manuella Ghazi (neurologist) for confusion spells. Last episode was in Dec 2022. He is being referred for EEG and MRI to address possible seizure like disorder. He denies any recent spells. He denies any dizziness. PMH significant for Bi-polar disorder, Lung CA (2020, no CA now) does get short of breath sometimes, Hard of hearing (wears hearing aides), history of herniated disc in lumbar spine (no pain currently), Peripheral neuropathy, OCD, memory deficits (Denies any issue), arthritis (mostly hands), GERD, emphysema, BPH;   ? Limitations Standing;Walking   ? How long can you sit comfortably? >1 hour- no difficulty;   ? How long can you stand comfortably? 5-10 min, limited because legs give out.   ? How long can you walk comfortably? a few hundred feet;   ? Diagnostic tests CT scan in Dec 2022 was negative for acute abnormality;   ? Patient Stated Goals "I wish I could walk better."   ? Currently in Pain? No/denies   ? ?  ?  ? ?  ? ? ? ? ?Neuro Re-ed ?  ?Orange hurdle: ?-single limb anterior/posterior step back 10x each LE with close CGA  ?-lateral step over and back 10x each side  ?  ?Standing with CGA next to support surface:  ?Airex pad: static stand 30 seconds x 2 trials, noticeable trembling of ankles/LE's with fatigue and challenge to maintain stability ?Airex pad: horizontal head turns 30 seconds scanning room 10x ; cueing for arc of motion  ?Airex pad: vertical head turns 30 seconds, cueing for arc of motion, noticeable sway with upward gaze increasing demand on ankle righting reaction musculature ?Airex pad: one foot on 6" step one foot on airex pad, hold position for 30 seconds, switch legs, 2x each LE; ? Airex pad 6" step alternating toe taps without UE support 10x each LE  ?  ?Half foam roller df/pf 15x with UE support ?  ?  ?  ?TherEx ?  ?Nustep Lvl 3 seat position 9; cues for SPM> 60 and breathing techniques, 4 minutes ?Walking knee flexion/marches 4x length  of // bars with 4# AW ?Standing hip abduction/side steps with 4# AW, 4x length of // bars ?Standing hip extension/backwards walking with 4# AW, 4x length of // bars ? ? ?10x STS ? ?  ?Seated: ?GTB abduction 2x15  ?4# AW around ankles: ?- alternating LAQ x10 each LE  ?-alternating march 15x each LE ?-lateral step out 15x each LE  ?  ?  ?  ?Pt educated throughout session about proper posture and technique with exercises. Improved exercise technique, movement at target joints, use of target muscles after min to mod verbal, visual, tactile cues. ? ? ? ?Patient presents with excellent motivation throughout physical therapy session. His ankle righting reactions are improving on stable and unstable surfaces with decreased instability in all positions. He does fatigue with use of ankle weights.  Patient will benefit from skilled physical therapy to increase mobility, strength, and stability. ? ? ? ? ? ? ? ? ? ? ? ? ? ? ? ? ? ? ? ? ? PT Education - 05/03/21 0803   ? ? Education Details exercise technique, body mechanics   ? Person(s) Educated Patient   ? Methods Explanation;Demonstration;Tactile cues;Verbal cues   ? Comprehension Verbalized understanding;Returned demonstration;Verbal cues required;Tactile cues required   ? ?  ?  ? ?  ? ? ? PT Short Term Goals - 04/12/21 2053   ? ?  ? PT SHORT TERM GOAL #1  ? Title Patient will be adherent to HEP at least 3x a week to improve mobility and strength.   ? Time 4   ? Period Weeks   ? Status New   ? Target Date 05/10/21   ?  ? PT SHORT TERM GOAL #2  ? Title Patient will be able to hold SLS for >5 sec to indicate improved safety and mobility when stepping in/out of tub or negotiating steps.   ? Time 4   ? Period Weeks   ? Status New   ? Target Date 05/10/21   ? ?  ?  ? ?  ? ? ? ? PT Long Term Goals - 04/12/21 2054   ? ?  ? PT LONG TERM GOAL #1  ? Title Patient will improve balance as evidenced by increasing Mini Best test to >22/28 to reduce fall risk.   ? Time 8   ? Period Weeks    ? Status New   ? Target Date 06/07/21   ?  ? PT LONG TERM GOAL #2  ? Title Patient will ambulate independently with normal base of support and reciprocal gait pattern for better gait ability at home

## 2021-05-05 ENCOUNTER — Ambulatory Visit: Payer: Medicare HMO

## 2021-05-05 DIAGNOSIS — M6281 Muscle weakness (generalized): Secondary | ICD-10-CM | POA: Diagnosis not present

## 2021-05-05 DIAGNOSIS — R262 Difficulty in walking, not elsewhere classified: Secondary | ICD-10-CM | POA: Diagnosis not present

## 2021-05-05 DIAGNOSIS — R2681 Unsteadiness on feet: Secondary | ICD-10-CM

## 2021-05-05 NOTE — Therapy (Signed)
[name] ?[place], [place], [address] ?Phone: [phone]   Fax:  [phone] ? ?Physical Therapy Treatment ? ?Patient Details  ?Name: [name] ?MRN: [id number] ?Date of Birth: [birth year] ?Referring Provider (PT): Manuella Ghazi, MD ? ? ?Encounter Date: 05/05/2021 ? ? PT End of Session - 05/05/21 0802   ? ? Visit Number 7   ? Number of Visits 17   ? Date for PT Re-Evaluation 06/07/21   ? Authorization Type [employer]   ? PT Start Time 0800   ? PT Stop Time 0844   ? PT Time Calculation (min) 44 min   ? Equipment Utilized During Treatment Gait belt   ? Activity Tolerance Patient tolerated treatment well   ? Behavior During Therapy [facility] for tasks assessed/performed   ? ?  ?  ? ?  ? ? ?Past Medical History:  ?Diagnosis Date  ? Arthritis   ? in his fingers  ? Bipolar disorder ([place])   ? BPH (benign prostatic hyperplasia)   ? Cancer [facility]) 2020  ? LUNG CA  ? Depression   ? Emphysema   ? Episodic confusion   ? Possible bipolar disorder  ? GERD (gastroesophageal reflux disease)   ? History of chicken pox   ? History of hiatal hernia   ? HNP (herniated nucleus pulposus), lumbar   ? Hyperlipidemia   ? Memory deficit 07/31/2012  ? OCD (obsessive compulsive disorder)   ? Peripheral neuropathy   ? Restless leg syndrome 01/10/2003  ? Unspecified psychosis 03/01/2012  ? Wears dentures   ? full upper and lower  ? Wears hearing aid in both ears   ? ? ?Past Surgical History:  ?Procedure Laterality Date  ? BACK SURGERY    ? COLONOSCOPY    ? ESOPHAGOGASTRODUODENOSCOPY    ? ESOPHAGOGASTRODUODENOSCOPY (EGD) WITH PROPOFOL [name] 02/28/2019  ? Procedure: ESOPHAGOGASTRODUODENOSCOPY (EGD) WITH DILATION;  Surgeon: Lucilla Lame, MD;  Location: [place];  Service: Endoscopy;  Laterality: [name];  Priority 3  ? FLEXIBLE BRONCHOSCOPY [name] 01/06/2019  ? Procedure: FLEXIBLE BRONCHOSCOPY;  Surgeon: [name], MD;  Location: [facility];  Service: Thoracic;  Laterality: [name];  ?  liver excision    ? for non-cancerous mass  ? LUMBAR LAMINECTOMY/DECOMPRESSION MICRODISCECTOMY Left 06/22/2017  ? Procedure: Extraforaminal Microdiscectomy  - L4-L5 - left;  Surgeon: Eustace Moore, MD;  Location: [place];  Service: Neurosurgery;  Laterality: Left;  ? LUNG SURGERY  2005  ? Lipoma Removal  ? MAXIMUM ACCESS (MAS) TRANSFORAMINAL LUMBAR INTERBODY FUSION (TLIF) 1 LEVEL [name] 02/18/2018  ? Procedure: Transforaminal Lumbar Interbody Fusion - Lumbar Four - Lumbar Five;  Surgeon: Eustace Moore, MD;  Location: [place];  Service: Neurosurgery;  Laterality: [name];  Transforaminal Lumbar Interbody Fusion - Lumbar Four - Lumbar Five  ? THORACOTOMY Right 01/06/2019  ? Procedure: THORACOTOMY MAJOR;  Surgeon: [name], MD;  Location: [facility];  Service: Thoracic;  Laterality: Right;  ? VIDEO ASSISTED THORACOSCOPY (VATS)/WEDGE RESECTION Right 01/06/2019  ? Procedure: VIDEO ASSISTED THORACOSCOPY (VATS)/WEDGE RESECTION-RUL and RLL-lung resection;  Surgeon: [name], MD;  Location: [facility];  Service: Thoracic;  Laterality: Right;  ? XI ROBOTIC ASSISTED PARAESOPHAGEAL HERNIA REPAIR [name] 05/06/2019  ? Procedure: XI ROBOTIC ASSISTED PARAESOPHAGEAL HERNIA REPAIR;  Surgeon: Jules Husbands, MD;  Location: [facility];  Service: General;  Laterality: [name];  ? ? ?There were no vitals filed for this visit. ? ? Subjective Assessment - 05/05/21 0801   ? ?  Subjective Patient mowed his yard with his riding Conservation officer, nature yesterday. No falls or LOB reports feeling more stiff today.   ? Pertinent History 76 yo Male reports imbalance over last several years. He presents to therapy without cane/walker. He reports he is not using any assistive device at this time. He denies any recent falls, but states he has to be careful. He denies any numbness/tingling; He denies any pain; He reports difficulty standing/walking because his "legs give out." He reports not being active at home. He does still mow the yard (uses a riding lawnmower); He  occassionally will go to the grocery store. He is being seen by Dr. Manuella Ghazi (neurologist) for confusion spells. Last episode was in Dec 2022. He is being referred for EEG and MRI to address possible seizure like disorder. He denies any recent spells. He denies any dizziness. PMH significant for Bi-polar disorder, Lung CA (2020, no CA now) does get short of breath sometimes, Hard of hearing (wears hearing aides), history of herniated disc in lumbar spine (no pain currently), Peripheral neuropathy, OCD, memory deficits (Denies any issue), arthritis (mostly hands), GERD, emphysema, BPH;   ? Limitations Standing;Walking   ? How long can you sit comfortably? >1 hour- no difficulty;   ? How long can you stand comfortably? 5-10 min, limited because legs give out.   ? How long can you walk comfortably? a few hundred feet;   ? Diagnostic tests CT scan in Dec 2022 was negative for acute abnormality;   ? Patient Stated Goals "I wish I could walk better."   ? Currently in Pain? No/denies   ? ?  ?  ? ?  ? ? ? ? ? ? ? ? ? ?  ?Neuro Re-ed ?Toss ball in hallway with ambulation ; patient very out of breath requiring seated rest break due to HR 143. 86 ft x 2 trials. BP afterwards: 113/75  ? ?Lateral step with ball toss 4x length of // bars;  HR elevation to 130  ? ?Orange hurdle: ?-single limb anterior/posterior step back 10x each LE with close CGA  ?-lateral step over and back 10x each side  ?  ?Standing with CGA next to support surface:  ?Airex pad: one foot on 6" step one foot on airex pad, hold position for 30 seconds, switch legs, 2x each LE; ? Airex pad 6" step alternating toe taps without UE support 10x each LE  ? airex pad throw balls into hoop for pertubation's with SUE support x 15 balls  ?Half foam roller df/pf 15x with UE support ?  ?  ?  ?TherEx ?  ?Nustep Lvl 3-4 seat position 9; cues for SPM> 60 and breathing techniques, 4 minutes ? ?Matrix leg press: BLE: #40; 10x; 2 sets cues for sequencing and slowing down movement  for optimal muscle recruitment ? ?Pt educated throughout session about proper posture and technique with exercises. Improved exercise technique, movement at target joints, use of target muscles after min to mod verbal, visual, tactile cues. ?  ?Patient's vitals monitored throughout session due to elevating HR. HR is able to return to therapeutic range quickly with seated rest breaks. Leg press tolerated well however patient is fatigued quickly by it requiring rest break between sets. Patient will benefit from skilled physical therapy to increase mobility, strength, and stability ? ? ? ? ? ? ? ? ? ? ? ? ? ? ? ? ? ? PT Education - 05/05/21 0802   ? ? Education Details exercise technique, body  mechanics   ? Person(s) Educated Patient   ? Methods Explanation;Demonstration;Tactile cues;Verbal cues   ? Comprehension Verbalized understanding;Returned demonstration;Verbal cues required;Tactile cues required   ? ?  ?  ? ?  ? ? ? PT Short Term Goals - 04/12/21 2053   ? ?  ? PT SHORT TERM GOAL #1  ? Title Patient will be adherent to HEP at least 3x a week to improve mobility and strength.   ? Time 4   ? Period Weeks   ? Status New   ? Target Date 05/10/21   ?  ? PT SHORT TERM GOAL #2  ? Title Patient will be able to hold SLS for >5 sec to indicate improved safety and mobility when stepping in/out of tub or negotiating steps.   ? Time 4   ? Period Weeks   ? Status New   ? Target Date 05/10/21   ? ?  ?  ? ?  ? ? ? ? PT Long Term Goals - 04/12/21 2054   ? ?  ? PT LONG TERM GOAL #1  ? Title Patient will improve balance as evidenced by increasing Mini Best test to >22/28 to reduce fall risk.   ? Time 8   ? Period Weeks   ? Status New   ? Target Date 06/07/21   ?  ? PT LONG TERM GOAL #2  ? Title Patient will ambulate independently with normal base of support and reciprocal gait pattern for better gait ability at home and in community.   ? Time 8   ? Period Weeks   ? Status New   ? Target Date 06/07/21   ?  ? PT LONG TERM GOAL #3  ?  Title Patient will improve FOTO survey score by at least 5% to indicate improved functional mobility with ADLs.   ? Time 8   ? Period Weeks   ? Status New   ? Target Date 06/07/21   ?  ? PT LONG TERM GOAL #4  ?

## 2021-05-10 ENCOUNTER — Other Ambulatory Visit: Payer: Self-pay | Admitting: Family Medicine

## 2021-05-10 ENCOUNTER — Ambulatory Visit (INDEPENDENT_AMBULATORY_CARE_PROVIDER_SITE_OTHER): Payer: Medicare HMO | Admitting: Family Medicine

## 2021-05-10 ENCOUNTER — Ambulatory Visit: Payer: Medicare HMO | Attending: Neurology

## 2021-05-10 ENCOUNTER — Encounter: Payer: Self-pay | Admitting: Family Medicine

## 2021-05-10 VITALS — BP 146/74 | HR 70 | Resp 16 | Ht 70.0 in | Wt 144.2 lb

## 2021-05-10 DIAGNOSIS — Z125 Encounter for screening for malignant neoplasm of prostate: Secondary | ICD-10-CM

## 2021-05-10 DIAGNOSIS — M6281 Muscle weakness (generalized): Secondary | ICD-10-CM | POA: Diagnosis not present

## 2021-05-10 DIAGNOSIS — R2681 Unsteadiness on feet: Secondary | ICD-10-CM | POA: Diagnosis not present

## 2021-05-10 DIAGNOSIS — E038 Other specified hypothyroidism: Secondary | ICD-10-CM

## 2021-05-10 DIAGNOSIS — J449 Chronic obstructive pulmonary disease, unspecified: Secondary | ICD-10-CM

## 2021-05-10 DIAGNOSIS — E785 Hyperlipidemia, unspecified: Secondary | ICD-10-CM | POA: Diagnosis not present

## 2021-05-10 DIAGNOSIS — Z0000 Encounter for general adult medical examination without abnormal findings: Secondary | ICD-10-CM

## 2021-05-10 DIAGNOSIS — F419 Anxiety disorder, unspecified: Secondary | ICD-10-CM | POA: Diagnosis not present

## 2021-05-10 DIAGNOSIS — F17210 Nicotine dependence, cigarettes, uncomplicated: Secondary | ICD-10-CM | POA: Diagnosis not present

## 2021-05-10 DIAGNOSIS — I700 Atherosclerosis of aorta: Secondary | ICD-10-CM

## 2021-05-10 DIAGNOSIS — Z289 Immunization not carried out for unspecified reason: Secondary | ICD-10-CM

## 2021-05-10 DIAGNOSIS — R262 Difficulty in walking, not elsewhere classified: Secondary | ICD-10-CM | POA: Diagnosis not present

## 2021-05-10 DIAGNOSIS — F3178 Bipolar disorder, in full remission, most recent episode mixed: Secondary | ICD-10-CM

## 2021-05-10 DIAGNOSIS — Z23 Encounter for immunization: Secondary | ICD-10-CM | POA: Diagnosis not present

## 2021-05-10 MED ORDER — SHINGRIX 50 MCG/0.5ML IM SUSR: 0.5000 mL | Freq: Once | INTRAMUSCULAR | 0 refills | Status: AC

## 2021-05-10 MED ORDER — TETANUS-DIPHTH-ACELL PERTUSSIS 5-2.5-18.5 LF-MCG/0.5 IM SUSY: 0.5000 mL | PREFILLED_SYRINGE | Freq: Once | INTRAMUSCULAR | 0 refills | Status: AC

## 2021-05-10 NOTE — Progress Notes (Deleted)
? ? ?I,[name],acting as a scribe for Lelon Huh, MD.,have documented all relevant documentation on the behalf of Lelon Huh, MD,as directed by  Lelon Huh, MD while in the presence of Lelon Huh, MD. ? ?Annual Wellness Visit ? ?  ? ?Patient: [name], Male    DOB: February 27, 1945, 76 y.o.   MRN: [id number] ?Visit Date: 05/10/2021 ? ?Today's Provider: Lelon Huh, MD  ? ?Chief Complaint  ?Patient presents with  ? Annual Exam  ? Depression  ? ?Subjective  ?  ?[name] is a 76 y.o. male who presents today for his Annual Wellness Visit. ?He reports consuming a general diet. The patient does not participate in regular exercise at present. He generally feels well. He reports sleeping well. He does have additional problems to discuss today.  ?Patient agreeable to Tetanus vaccine today. Patient reports colonoscopy within the year. To return in 5 years. ?Depression, Follow-up ? ?He  was last seen for this 15 months ago. ?Changes made at last visit include continue Lexapro.  ?He reports excellent compliance with treatment. ?He is not having side effects.  ?He reports good tolerance of treatment. ?Current symptoms include: depressed mood, difficulty concentrating, and hopelessness ?He feels he is Worse since last visit.Patient feels Lexapro is not making a difference.   ? ?  05/10/2021  ? 11:25 AM  ?GAD 7 : Generalized Anxiety Score  ?Nervous, Anxious, on Edge 3  ?Control/stop worrying 2  ?Worry too much - different things 1  ?Trouble relaxing 0  ?Restless 0  ?Easily annoyed or irritable 3  ?Afraid - awful might happen 1  ?Total GAD 7 Score 10  ?Anxiety Difficulty Very difficult  ? ?[facility] Office Visit from 05/10/2021 in [place]  ?PHQ-9 Total Score 13  ? ?  ? ? ?  05/10/2021  ? 11:33 AM 02/13/2020  ?  8:59 AM 11/13/2018  ? 10:04 AM 08/06/2017  ?  9:07 AM 07/19/2016  ?  9:10 AM  ?Depression screen PHQ 2/9  ?Decreased Interest 2 0 0 0 0  ?Down, Depressed, Hopeless 3 0 0 0 0  ?PHQ - 2  Score 5 0 0 0 0  ?Altered sleeping 0 0 0 0 0  ?Tired, decreased energy 2 0 0 0 0  ?Change in appetite 3 0 0 3 0  ?Feeling bad or failure about yourself  2 0 0 0 0  ?Trouble concentrating 1 0 0 0 0  ?Moving slowly or fidgety/restless 0 0 0 0 0  ?Suicidal thoughts 0 0 0 0 0  ?PHQ-9 Score 13 0 0 3 0  ?Difficult doing work/chores Very difficult Not difficult at all Not difficult at all Not difficult at all Not difficult at all  ?-9 ? ? ? ?  05/10/2021  ? 11:33 AM 02/13/2020  ?  8:59 AM 11/13/2018  ? 10:04 AM  ?Depression screen PHQ 2/9  ?Decreased Interest 2 0 0  ?Down, Depressed, Hopeless 3 0 0  ?PHQ - 2 Score 5 0 0  ?Altered sleeping 0 0 0  ?Tired, decreased energy 2 0 0  ?Change in appetite 3 0 0  ?Feeling bad or failure about yourself  2 0 0  ?Trouble concentrating 1 0 0  ?Moving slowly or fidgety/restless 0 0 0  ?Suicidal thoughts 0 0 0  ?PHQ-9 Score 13 0 0  ?Difficult doing work/chores Very difficult Not difficult at all Not difficult at all  ?  ?-----------------------------------------------------------------------------------------  ? ? ? ?Medications: ?Outpatient Medications Prior  to Visit  ?Medication Sig  ? aspirin 81 MG tablet Take 1 tablet (81 mg total) by mouth daily.  ? cholecalciferol (VITAMIN D3) 25 MCG (1000 UT) tablet Take 1,000 Units by mouth daily.  ? Coenzyme Q10 (COQ10) 100 MG CAPS Take 100 mg by mouth daily.   ? divalproex (DEPAKOTE ER) 500 MG 24 hr tablet Take 1,000 mg by mouth at bedtime.   ? omeprazole (PRILOSEC) 40 MG capsule TAKE 1 CAPSULE BY MOUTH EVERY DAY  ? simvastatin (ZOCOR) 40 MG tablet TAKE 1 TABLET BY MOUTH EVERY DAY AT 6PM  ? vitamin B-12 (CYANOCOBALAMIN) 1000 MCG tablet Take 1,000 mcg by mouth daily.  ? ziprasidone (GEODON) 40 MG capsule Take 40 mg by mouth daily with supper.   ? ?No facility-administered medications prior to visit.  ?  ?Allergies  ?Allergen Reactions  ? Ropinirole Hcl Other (See Comments)  ?  Confusion, agiitation, disorientation, Hallucinations  ? Tramadol   ?   lethargy  ? ? ?Patient Care Team: ?Birdie Sons, MD as PCP - General (Family Medicine) ?Chauncey Mann, MD as Referring Physician (Psychiatry) ?Cammie Sickle, MD as Consulting Physician (Internal Medicine) ?[name], MD (Inactive) as Referring Physician (Cardiothoracic Surgery) ?Jules Husbands, MD as Consulting Physician (General Surgery) ? ?Review of Systems  ?Constitutional:  Positive for unexpected weight change.  ?HENT:  Positive for hearing loss, rhinorrhea and trouble swallowing.   ?Eyes:  Positive for redness and itching.  ?Respiratory:  Positive for cough and shortness of breath.   ?Genitourinary:  Positive for difficulty urinating and frequency.  ?Musculoskeletal:  Positive for back pain.  ?Skin:  Positive for wound.  ?Neurological:  Positive for numbness.  ?Hematological:  Bruises/bleeds easily.  ?Psychiatric/Behavioral:  Positive for confusion. The patient is nervous/anxious.   ? ?{Labs  Heme  Chem  Endocrine  Serology  Results Review (optional):23779} ?  ? Objective  ?  ?Vitals: BP 109/84 (BP Location: Right Arm, Patient Position: Sitting, Cuff Size: Normal)   Pulse 78   Temp 97.9 ?F (36.6 ?C) (Oral)   Resp 16   Wt 191 lb (86.6 kg)   SpO2 98%   BMI 27.41 kg/m?  ?{Show previous vital signs (optional):23777} ? ? ?Physical Exam ?*** ? ?Most recent functional status assessment: ?   ? View : No data to display.  ?  ?  ?  ? ?Most recent fall risk assessment: ? ?  05/10/2021  ? 11:33 AM  ?Fall Risk   ?Falls in the past year? 0  ?Number falls in past yr: 0  ?Injury with Fall? 0  ?Follow up Falls evaluation completed  ? ? Most recent depression screenings: ? ?  05/10/2021  ? 11:33 AM 02/13/2020  ?  8:59 AM  ?PHQ 2/9 Scores  ?PHQ - 2 Score 5 0  ?PHQ- 9 Score 13 0  ? ?Most recent cognitive screening: ? ?  02/13/2020  ?  9:05 AM  ?6CIT Screen  ?What Year? 0 points  ?What month? 0 points  ?What time? 0 points  ?Count back from 20 0 points  ?Months in reverse 0 points  ?Repeat phrase 4 points   ?Total Score 4 points  ? ?Most recent Audit-C alcohol use screening ? ?  02/13/2020  ?  9:00 AM  ?Alcohol Use Disorder Test (AUDIT)  ?1. How often do you have a drink containing alcohol? 0  ?2. How many drinks containing alcohol do you have on a typical day when you are drinking? 0  ?3.  How often do you have six or more drinks on one occasion? 0  ?AUDIT-C Score 0  ?Alcohol Brief Interventions/Follow-up AUDIT Score <7 follow-up not indicated  ? ?A score of 3 or more in women, and 4 or more in men indicates increased risk for alcohol abuse, EXCEPT if all of the points are from question 1  ? ?No results found for any visits on 05/10/21. ? Assessment & Plan  ?  ? ?Annual wellness visit done today including the all of the following: ?Reviewed patient's Family Medical History ?Reviewed and updated list of patient's medical providers ?Assessment of cognitive impairment was done ?Assessed patient's functional ability ?Established a written schedule for health screening services ?Health Risk Assessent Completed and Reviewed ? ?Exercise Activities and Dietary recommendations ? Goals   ?None ?  ? ? ?Immunization History  ?Administered Date(s) Administered  ? Fluad Quad(high Dose 65+) 10/28/2018  ? Influenza, High Dose Seasonal PF 10/08/2015, 10/31/2016, 10/27/2017  ? Pneumococcal Conjugate-13 07/14/2013  ? Pneumococcal Polysaccharide-23 07/04/2011, 01/07/2019, 06/11/2019  ? Tdap 06/04/2008  ? Zoster, Live 06/08/2009  ? ? ?Health Maintenance  ?Topic Date Due  ? COVID-19 Vaccine (1) Never done  ? Zoster Vaccines- Shingrix (1 of 2) Never done  ? TETANUS/TDAP  06/05/2018  ? COLONOSCOPY (Pts 45-28yrs Insurance coverage will need to be confirmed)  08/08/2020  ? INFLUENZA VACCINE  08/09/2021  ? Pneumonia Vaccine 45+ Years old  Completed  ? Hepatitis C Screening  Completed  ? HPV VACCINES  Aged Out  ? ? ? ?Discussed health benefits of physical activity, and encouraged him to engage in regular exercise appropriate for his age and  condition.  ?  ?*** ? ?No follow-ups on file.  ?  ? ?{provider attestation***:1} ? ? ?Lelon Huh, MD  ?[facility] ?[phone] (phone) ?[phone] (fax) ? ? ?

## 2021-05-10 NOTE — Progress Notes (Signed)
? ? ? ?Annual Wellness Visit ? ?  ? ?Patient: [name], Male    DOB: Aug 31, 1945, 76 y.o.   MRN: [id number] ?Visit Date: 05/10/2021 ? ?Today's Provider: Lelon Huh, MD  ? ?Chief Complaint  ?Patient presents with  ? Annual Exam  ? Depression  ? ?Subjective  ?  ?[name] is a 76 y.o. male who presents today for his Annual Wellness Visit. ? ? ?Medications: ?Outpatient Medications Prior to Visit  ?Medication Sig  ? aspirin 81 MG tablet Take 1 tablet (81 mg total) by mouth daily.  ? cholecalciferol (VITAMIN D3) 25 MCG (1000 UT) tablet Take 1,000 Units by mouth daily.  ? Coenzyme Q10 (COQ10) 100 MG CAPS Take 100 mg by mouth daily.   ? divalproex (DEPAKOTE ER) 500 MG 24 hr tablet Take 1,000 mg by mouth at bedtime.   ? omeprazole (PRILOSEC) 40 MG capsule TAKE 1 CAPSULE BY MOUTH EVERY DAY  ? simvastatin (ZOCOR) 40 MG tablet TAKE 1 TABLET BY MOUTH EVERY DAY AT 6PM  ? vitamin B-12 (CYANOCOBALAMIN) 1000 MCG tablet Take 1,000 mcg by mouth daily.  ? ziprasidone (GEODON) 40 MG capsule Take 40 mg by mouth daily with supper.   ? ?No facility-administered medications prior to visit.  ?  ?Allergies  ?Allergen Reactions  ? Ropinirole Hcl Other (See Comments)  ?  Confusion, agiitation, disorientation, Hallucinations  ? Tramadol   ?  lethargy  ? ? ?Patient Care Team: ?Birdie Sons, MD as PCP - General (Family Medicine) ?Chauncey Mann, MD as Referring Physician (Psychiatry) ?Cammie Sickle, MD as Consulting Physician (Internal Medicine) ?[name], MD (Inactive) as Referring Physician (Cardiothoracic Surgery) ?Jules Husbands, MD as Consulting Physician (General Surgery) ? ? ? ?  ? Objective  ?  ? ?Most recent functional status assessment: ? ?  05/10/2021  ? 11:49 AM  ?In your present state of health, do you have any difficulty performing the following activities:  ?Hearing? 0  ?Vision? 0  ?Difficulty concentrating or making decisions? 0  ?Walking or climbing stairs? 1  ?Dressing or bathing? 0  ?Doing  errands, shopping? 0  ? ?Most recent fall risk assessment: ? ?  05/10/2021  ? 11:48 AM  ?Fall Risk   ?Falls in the past year? 1  ?Number falls in past yr: 0  ?Injury with Fall? 0  ?Risk for fall due to : History of fall(s)  ? ? Most recent depression screenings: ? ?  05/10/2021  ? 11:49 AM 02/13/2020  ?  8:59 AM  ?PHQ 2/9 Scores  ?PHQ - 2 Score 3 0  ?PHQ- 9 Score 10 0  ? ?Most recent cognitive screening: ? ?  02/13/2020  ?  9:05 AM  ?6CIT Screen  ?What Year? 0 points  ?What month? 0 points  ?What time? 0 points  ?Count back from 20 0 points  ?Months in reverse 0 points  ?Repeat phrase 4 points  ?Total Score 4 points  ? ?Most recent Audit-C alcohol use screening ? ?  05/10/2021  ? 11:48 AM  ?Alcohol Use Disorder Test (AUDIT)  ?1. How often do you have a drink containing alcohol? 0  ?2. How many drinks containing alcohol do you have on a typical day when you are drinking? 0  ?3. How often do you have six or more drinks on one occasion? 0  ?AUDIT-C Score 0  ? ?A score of 3 or more in women, and 4 or more in men indicates increased risk for alcohol  abuse, EXCEPT if all of the points are from question 1  ? ?No results found for any visits on 05/10/21. ? Assessment & Plan  ?  ? ?Annual wellness visit done today including the all of the following: ?Reviewed patient's Family Medical History ?Reviewed and updated list of patient's medical providers ?Assessment of cognitive impairment was done ?Assessed patient's functional ability ?Established a written schedule for health screening services ?Health Risk Assessent Completed and Reviewed ? ?Exercise Activities and Dietary recommendations ? Goals   ?None ?  ? ? ?Immunization History  ?Administered Date(s) Administered  ? Fluad Quad(high Dose 65+) 10/28/2018  ? Influenza, High Dose Seasonal PF 10/08/2015, 10/31/2016, 10/27/2017  ? Pneumococcal Conjugate-13 07/14/2013  ? Pneumococcal Polysaccharide-23 07/04/2011, 01/07/2019, 06/11/2019  ? Tdap 06/04/2008  ? Zoster, Live 06/08/2009   ? ? ?Health Maintenance  ?Topic Date Due  ? COVID-19 Vaccine (1) Never done  ? Zoster Vaccines- Shingrix (1 of 2) Never done  ? TETANUS/TDAP  06/05/2018  ? COLONOSCOPY (Pts 45-80yrs Insurance coverage will need to be confirmed)  08/08/2020  ? INFLUENZA VACCINE  08/09/2021  ? Pneumonia Vaccine 26+ Years old  Completed  ? Hepatitis C Screening  Completed  ? HPV VACCINES  Aged Out  ? ? ? ?Discussed health benefits of physical activity, and encouraged him to engage in regular exercise appropriate for his age and condition.  ?  ? ?  ? ? ? ? ?Lelon Huh, MD  ?[facility] ?[phone] (phone) ?[phone] (fax) ? ?[facility]   ?

## 2021-05-10 NOTE — Patient Instructions (Signed)
Please review the attached list of medications and notify my office if there are any errors.   Please go to the lab draw station in [address] on the second floor of [facility]  when you are fasting for 8 hours. Normal hours are 8:00am to 11:30am and 1:00pm to 4:00pm Monday through Friday     

## 2021-05-10 NOTE — Therapy (Signed)
[facility] ?[place], [place], [address] ?Phone: [phone]   Fax:  [phone] ? ?Physical Therapy Treatment ? ?Patient Details  ?Name: [name] ?MRN: [id number] ?Date of Birth: [birth year] ?Referring Provider (PT): Manuella Ghazi, MD ? ? ?Encounter Date: 05/10/2021 ? ? PT End of Session - 05/10/21 0743   ? ? Visit Number 8   ? Number of Visits 17   ? Date for PT Re-Evaluation 06/07/21   ? Authorization Type [employer]   ? PT Start Time [phone]   ? PT Stop Time 0844   ? PT Time Calculation (min) 45 min   ? Equipment Utilized During Treatment Gait belt   ? Activity Tolerance Patient tolerated treatment well   ? Behavior During Therapy [facility] for tasks assessed/performed   ? ?  ?  ? ?  ? ? ?Past Medical History:  ?Diagnosis Date  ? Arthritis   ? in his fingers  ? Bipolar disorder ([place])   ? BPH (benign prostatic hyperplasia)   ? Cancer [facility]) 2020  ? LUNG CA  ? Depression   ? Emphysema   ? Episodic confusion   ? Possible bipolar disorder  ? GERD (gastroesophageal reflux disease)   ? History of chicken pox   ? History of hiatal hernia   ? HNP (herniated nucleus pulposus), lumbar   ? Hyperlipidemia   ? Memory deficit 07/31/2012  ? OCD (obsessive compulsive disorder)   ? Peripheral neuropathy   ? Restless leg syndrome 01/10/2003  ? Unspecified psychosis 03/01/2012  ? Wears dentures   ? full upper and lower  ? Wears hearing aid in both ears   ? ? ?Past Surgical History:  ?Procedure Laterality Date  ? BACK SURGERY    ? COLONOSCOPY    ? ESOPHAGOGASTRODUODENOSCOPY    ? ESOPHAGOGASTRODUODENOSCOPY (EGD) WITH PROPOFOL [name] 02/28/2019  ? Procedure: ESOPHAGOGASTRODUODENOSCOPY (EGD) WITH DILATION;  Surgeon: Lucilla Lame, MD;  Location: [place];  Service: Endoscopy;  Laterality: [name];  Priority 3  ? FLEXIBLE BRONCHOSCOPY [name] 01/06/2019  ? Procedure: FLEXIBLE BRONCHOSCOPY;  Surgeon: [name], MD;  Location: [facility];  Service: Thoracic;  Laterality: [name];  ?  liver excision    ? for non-cancerous mass  ? LUMBAR LAMINECTOMY/DECOMPRESSION MICRODISCECTOMY Left 06/22/2017  ? Procedure: Extraforaminal Microdiscectomy  - L4-L5 - left;  Surgeon: Eustace Moore, MD;  Location: [place];  Service: Neurosurgery;  Laterality: Left;  ? LUNG SURGERY  2005  ? Lipoma Removal  ? MAXIMUM ACCESS (MAS) TRANSFORAMINAL LUMBAR INTERBODY FUSION (TLIF) 1 LEVEL [name] 02/18/2018  ? Procedure: Transforaminal Lumbar Interbody Fusion - Lumbar Four - Lumbar Five;  Surgeon: Eustace Moore, MD;  Location: [facility];  Service: Neurosurgery;  Laterality: [name];  Transforaminal Lumbar Interbody Fusion - Lumbar Four - Lumbar Five  ? THORACOTOMY Right 01/06/2019  ? Procedure: THORACOTOMY MAJOR;  Surgeon: [name], MD;  Location: [facility];  Service: Thoracic;  Laterality: Right;  ? VIDEO ASSISTED THORACOSCOPY (VATS)/WEDGE RESECTION Right 01/06/2019  ? Procedure: VIDEO ASSISTED THORACOSCOPY (VATS)/WEDGE RESECTION-RUL and RLL-lung resection;  Surgeon: [name], MD;  Location: [facility];  Service: Thoracic;  Laterality: Right;  ? XI ROBOTIC ASSISTED PARAESOPHAGEAL HERNIA REPAIR [name] 05/06/2019  ? Procedure: XI ROBOTIC ASSISTED PARAESOPHAGEAL HERNIA REPAIR;  Surgeon: Jules Husbands, MD;  Location: [facility];  Service: General;  Laterality: [name];  ? ? ?There were no vitals filed for this visit. ? ? Subjective Assessment - 05/10/21 0759   ? ?  Subjective Patient reports no aches or pains. Is stiff and having a hard time walking. Reports he fell once when putting brush in the woods. Is going to Dr. Alm Bustard after PT for a physical.   ? Pertinent History 76 yo Male reports imbalance over last several years. He presents to therapy without cane/walker. He reports he is not using any assistive device at this time. He denies any recent falls, but states he has to be careful. He denies any numbness/tingling; He denies any pain; He reports difficulty standing/walking because his "legs give out." He reports not being active at home.  He does still mow the yard (uses a riding lawnmower); He occassionally will go to the grocery store. He is being seen by Dr. Manuella Ghazi (neurologist) for confusion spells. Last episode was in Dec 2022. He is being referred for EEG and MRI to address possible seizure like disorder. He denies any recent spells. He denies any dizziness. PMH significant for Bi-polar disorder, Lung CA (2020, no CA now) does get short of breath sometimes, Hard of hearing (wears hearing aides), history of herniated disc in lumbar spine (no pain currently), Peripheral neuropathy, OCD, memory deficits (Denies any issue), arthritis (mostly hands), GERD, emphysema, BPH;   ? Limitations Standing;Walking   ? How long can you sit comfortably? >1 hour- no difficulty;   ? How long can you stand comfortably? 5-10 min, limited because legs give out.   ? How long can you walk comfortably? a few hundred feet;   ? Diagnostic tests CT scan in Dec 2022 was negative for acute abnormality;   ? Patient Stated Goals "I wish I could walk better."   ? Currently in Pain? No/denies   ? ?  ?  ? ?  ? ? ? ?  ?Neuro Re-ed ?Bosu ball round side up: ?-lateral modified lunge 10x each LE BUE support ?-modified forward lunge 10x each LE ; BUE support ? ?Anterior posterior tilt board; Max cueing for sequencing 10x each LE placement ? ?Orange hurdle: ?-single limb anterior/posterior step back 10x each LE with close CGA  ?-lateral step over and back 10x each side  ?  ?Standing with CGA next to support surface:  ?Airex balance beam:  ?-lateral stepping 6x length of // bars ?-tandem walk 4x length of // bars ?  ?TherEx ?  ?Nustep Lvl 4 seat position 9; cues for SPM> 60 and breathing techniques, 4 minutes ?  ?Matrix leg press: BLE: #55; 10x; 2 sets cues for sequencing and slowing down movement for optimal muscle recruitment ? ?Superset:x2 trials  ?Exercise 1: walk 160 ft with CGA cues for sequencing ?Exercise 2: sit to stand 10x no UE support  ? ?Squat with UE support on bars  10x ?Heel raise with UE support 15x  ?  ?Pt educated throughout session about proper posture and technique with exercises. Improved exercise technique, movement at target joints, use of target muscles after min to mod verbal, visual, tactile cues. ? ? ? ? ?Patient is highly motivated for progression of strength and stability. He is able to tolerate leg press with increased resistance. He does require monitoring of vitals to ensure therapeutic range and rest breaks accordingly. Unstable surfaces are improving including surfaces such as bosu ball. Patient will benefit from skilled physical therapy to increase mobility, strength, and stability ? ? ? ? ? ? ? ? ? ? ? ? ? ? ? ? ? ? ? ? PT Education - 05/10/21 0742   ? ? Education Details exercise  technique, body mechanics   ? Person(s) Educated Patient   ? Methods Explanation;Demonstration;Tactile cues;Verbal cues   ? Comprehension Verbalized understanding;Returned demonstration;Tactile cues required;Verbal cues required   ? ?  ?  ? ?  ? ? ? PT Short Term Goals - 04/12/21 2053   ? ?  ? PT SHORT TERM GOAL #1  ? Title Patient will be adherent to HEP at least 3x a week to improve mobility and strength.   ? Time 4   ? Period Weeks   ? Status New   ? Target Date 05/10/21   ?  ? PT SHORT TERM GOAL #2  ? Title Patient will be able to hold SLS for >5 sec to indicate improved safety and mobility when stepping in/out of tub or negotiating steps.   ? Time 4   ? Period Weeks   ? Status New   ? Target Date 05/10/21   ? ?  ?  ? ?  ? ? ? ? PT Long Term Goals - 04/12/21 2054   ? ?  ? PT LONG TERM GOAL #1  ? Title Patient will improve balance as evidenced by increasing Mini Best test to >22/28 to reduce fall risk.   ? Time 8   ? Period Weeks   ? Status New   ? Target Date 06/07/21   ?  ? PT LONG TERM GOAL #2  ? Title Patient will ambulate independently with normal base of support and reciprocal gait pattern for better gait ability at home and in community.   ? Time 8   ? Period Weeks   ?  Status New   ? Target Date 06/07/21   ?  ? PT LONG TERM GOAL #3  ? Title Patient will improve FOTO survey score by at least 5% to indicate improved functional mobility with ADLs.   ? Time 8   ? Period Weeks

## 2021-05-10 NOTE — Progress Notes (Signed)
? ?I,Sha'taria Tyson,acting as a scribe for Lelon Huh, MD.,have documented all relevant documentation on the behalf of Lelon Huh, MD,as directed by  Lelon Huh, MD while in the presence of Lelon Huh, MD.  ? ?Complete physical exam ? ? ?Patient: [name]   DOB: 1945/06/19   76 y.o. Male  MRN: [id number] ?Visit Date: 05/10/2021 ? ?Today's healthcare provider: Lelon Huh, MD  ? ?Chief Complaint  ?Patient presents with  ? Annual Exam  ? Depression  ? ?Subjective  ?  ?[name] is a 76 y.o. male who presents today for a complete physical exam.  ?He reports consuming a general diet. The patient does not participate in regular exercise at present. He generally feels well. He reports sleeping well. He does not have additional problems to discuss today.  ?HPI  ? ?He is also here to follow up on hyperlipidemia, doing well on current medications with no adverse effects. Continue regular follow up with psychiatry for bipolar disorder and Dr. Rogue Bussing for lung cancer.  ? ?Past Medical History:  ?Diagnosis Date  ? Arthritis   ? in his fingers  ? Bipolar disorder ([place])   ? BPH (benign prostatic hyperplasia)   ? Cancer [facility]) 2020  ? LUNG CA  ? Depression   ? Emphysema   ? Episodic confusion   ? Possible bipolar disorder  ? GERD (gastroesophageal reflux disease)   ? History of chicken pox   ? History of hiatal hernia   ? HNP (herniated nucleus pulposus), lumbar   ? Hyperlipidemia   ? Memory deficit 07/31/2012  ? OCD (obsessive compulsive disorder)   ? Peripheral neuropathy   ? Restless leg syndrome 01/10/2003  ? Unspecified psychosis 03/01/2012  ? Wears dentures   ? full upper and lower  ? Wears hearing aid in both ears   ? ?Past Surgical History:  ?Procedure Laterality Date  ? BACK SURGERY    ? COLONOSCOPY    ? ESOPHAGOGASTRODUODENOSCOPY    ? ESOPHAGOGASTRODUODENOSCOPY (EGD) WITH PROPOFOL [name] 02/28/2019  ? Procedure: ESOPHAGOGASTRODUODENOSCOPY (EGD) WITH DILATION;  Surgeon: Lucilla Lame, MD;   Location: [place];  Service: Endoscopy;  Laterality: [name];  Priority 3  ? FLEXIBLE BRONCHOSCOPY [name] 01/06/2019  ? Procedure: FLEXIBLE BRONCHOSCOPY;  Surgeon: [name], MD;  Location: [facility];  Service: Thoracic;  Laterality: [name];  ? liver excision    ? for non-cancerous mass  ? LUMBAR LAMINECTOMY/DECOMPRESSION MICRODISCECTOMY Left 06/22/2017  ? Procedure: Extraforaminal Microdiscectomy  - L4-L5 - left;  Surgeon: Eustace Moore, MD;  Location: [place];  Service: Neurosurgery;  Laterality: Left;  ? LUNG SURGERY  2005  ? Lipoma Removal  ? MAXIMUM ACCESS (MAS) TRANSFORAMINAL LUMBAR INTERBODY FUSION (TLIF) 1 LEVEL [name] 02/18/2018  ? Procedure: Transforaminal Lumbar Interbody Fusion - Lumbar Four - Lumbar Five;  Surgeon: Eustace Moore, MD;  Location: [place];  Service: Neurosurgery;  Laterality: [name];  Transforaminal Lumbar Interbody Fusion - Lumbar Four - Lumbar Five  ? THORACOTOMY Right 01/06/2019  ? Procedure: THORACOTOMY MAJOR;  Surgeon: [name], MD;  Location: [facility];  Service: Thoracic;  Laterality: Right;  ? VIDEO ASSISTED THORACOSCOPY (VATS)/WEDGE RESECTION Right 01/06/2019  ? Procedure: VIDEO ASSISTED THORACOSCOPY (VATS)/WEDGE RESECTION-RUL and RLL-lung resection;  Surgeon: [name], MD;  Location: [facility];  Service: Thoracic;  Laterality: Right;  ? XI ROBOTIC ASSISTED PARAESOPHAGEAL HERNIA REPAIR [name] 05/06/2019  ? Procedure: XI ROBOTIC ASSISTED PARAESOPHAGEAL HERNIA REPAIR;  Surgeon: Jules Husbands, MD;  Location: [facility];  Service: General;  Laterality: [name];  ? ?Social History  ? ?Socioeconomic History  ? Marital status: Divorced  ?  Spouse name: Not on file  ? Number of children: 0  ? Years of education: 49  ? Highest education level: Not on file  ?Occupational History  ? Occupation: Retired  ?Tobacco Use  ? Smoking status: Former  ?  Packs/day: 0.75  ?  Years: 50.00  ?  Pack years: 37.50  ?  Types: Cigarettes  ?  Quit date: 11/26/2018  ?  Years since quitting: 2.4  ? Smokeless tobacco:  Never  ?Vaping Use  ? Vaping Use: Never used  ?Substance and Sexual Activity  ? Alcohol use: No  ? Drug use: No  ? Sexual activity: Not on file  ?Other Topics Concern  ? Not on file  ?Social History Narrative  ? Patient lives at home alone.  ? Patient is retired.   ? Patient has no children.   ? Patient has 11th grade education.   ? ?Social Determinants of Health  ? ?Financial Resource Strain: Not on file  ?Food Insecurity: Not on file  ?Transportation Needs: Not on file  ?Physical Activity: Not on file  ?Stress: Not on file  ?Social Connections: Not on file  ?Intimate Partner Violence: Not on file  ? ?Family Status  ?Relation Name Status  ? Mother  Deceased at age 48  ?     MI  ? Father  Deceased at age 6  ?     from cancer (unknown type)  ? Sister  Alive  ? Sister  Alive  ? Sister  Alive  ? Other Niece-2 Alive  ? PGF  (Not Specified)  ? ?Family History  ?Problem Relation Age of Onset  ? Heart disease Mother   ? Cancer Father   ?     lung  ? Heart disease Father   ? Diabetes Sister   ? Heart disease Sister   ? Seizures Paternal Grandfather   ? ?Allergies  ?Allergen Reactions  ? Ropinirole Hcl Other (See Comments)  ?  Confusion, agiitation, disorientation, Hallucinations  ? Tramadol   ?  lethargy  ?  ?Patient Care Team: ?Birdie Sons, MD as PCP - General (Family Medicine) ?Chauncey Mann, MD as Referring Physician (Psychiatry) ?Cammie Sickle, MD as Consulting Physician (Internal Medicine) ?[name], MD (Inactive) as Referring Physician (Cardiothoracic Surgery) ?Jules Husbands, MD as Consulting Physician (General Surgery)  ? ?Medications: ?Outpatient Medications Prior to Visit  ?Medication Sig  ? aspirin 81 MG tablet Take 1 tablet (81 mg total) by mouth daily.  ? cholecalciferol (VITAMIN D3) 25 MCG (1000 UT) tablet Take 1,000 Units by mouth daily.  ? Coenzyme Q10 (COQ10) 100 MG CAPS Take 100 mg by mouth daily.   ? divalproex (DEPAKOTE ER) 500 MG 24 hr tablet Take 1,000 mg by mouth at bedtime.   ?  omeprazole (PRILOSEC) 40 MG capsule TAKE 1 CAPSULE BY MOUTH EVERY DAY  ? simvastatin (ZOCOR) 40 MG tablet TAKE 1 TABLET BY MOUTH EVERY DAY AT 6PM  ? vitamin B-12 (CYANOCOBALAMIN) 1000 MCG tablet Take 1,000 mcg by mouth daily.  ? ziprasidone (GEODON) 40 MG capsule Take 40 mg by mouth daily with supper.   ? ?No facility-administered medications prior to visit.  ? ? ?Review of Systems ? ? ? Objective  ?  ?BP (!) 146/74 (BP Location: Right Arm, Patient Position: Sitting)   Pulse 70   Resp 16   Ht _0  (1.778 m)  Wt 144 lb 3.2 oz (65.4 kg)   SpO2 97%   BMI 20.69 kg/m?  ? ? ? ?Physical Exam  ? ?General Appearance:    Well developed, well nourished male. Alert, cooperative, in no acute distress, appears stated age  ?Head:    Normocephalic, without obvious abnormality, atraumatic  ?Eyes:    PERRL, conjunctiva/corneas clear, EOM's intact, fundi  ?  benign, both eyes       ?Ears:    Normal TM's and external ear canals, both ears  ?Nose:   Nares normal, septum midline, mucosa normal, no drainage ?  or sinus tenderness  ?Throat:   Lips, mucosa, and tongue normal; teeth and gums normal  ?Neck:   Supple, symmetrical, trachea midline, no adenopathy;     ?  thyroid:  No enlargement/tenderness/nodules; no carotid ?  bruit or JVD  ?Back:     Symmetric, no curvature, ROM normal, no CVA tenderness  ?Lungs:     Clear to auscultation bilaterally, respirations unlabored  ?Chest wall:    No tenderness or deformity  ?Heart:    Normal heart rate. Normal rhythm. No murmurs, rubs, or gallops.  S1 and S2 normal  ?Abdomen:     Soft, non-tender, bowel sounds active all four quadrants,  ?  no masses, no organomegaly  ?Genitalia:    deferred  ?Rectal:    deferred  ?Extremities:   All extremities are intact. No cyanosis or edema  ?Pulses:   2+ and symmetric all extremities  ?Skin:   Skin color, texture, turgor normal, no rashes or lesions  ?Lymph nodes:   Cervical, supraclavicular, and axillary nodes normal  ?Neurologic:   CNII-XII intact.  Normal strength, sensation and reflexes    ?  throughout  ?  ? Assessment & Plan  ?  ?1. Annual physical exam ? ? ?2. Hyperlipidemia, unspecified hyperlipidemia type ?He is tolerating simvastatin well with no advers

## 2021-05-11 LAB — COMPREHENSIVE METABOLIC PANEL
ALT: 15 IU/L (ref 0–44)
AST: 35 IU/L (ref 0–40)
Albumin/Globulin Ratio: 1.6 (ref 1.2–2.2)
Albumin: 4.5 g/dL (ref 3.7–4.7)
Alkaline Phosphatase: 80 IU/L (ref 44–121)
BUN/Creatinine Ratio: 20 (ref 10–24)
BUN: 24 mg/dL (ref 8–27)
Bilirubin Total: 0.4 mg/dL (ref 0.0–1.2)
CO2: 22 mmol/L (ref 20–29)
Calcium: 9.8 mg/dL (ref 8.6–10.2)
Chloride: 105 mmol/L (ref 96–106)
Creatinine, Ser: 1.19 mg/dL (ref 0.76–1.27)
Globulin, Total: 2.9 g/dL (ref 1.5–4.5)
Glucose: 93 mg/dL (ref 70–99)
Potassium: 4.1 mmol/L (ref 3.5–5.2)
Sodium: 146 mmol/L — ABNORMAL HIGH (ref 134–144)
Total Protein: 7.4 g/dL (ref 6.0–8.5)
eGFR: 64 mL/min/{1.73_m2} (ref >59)

## 2021-05-11 LAB — T4, FREE: Free T4: 1.08 ng/dL (ref 0.82–1.77)

## 2021-05-11 LAB — ALB+ALP+AST+BUN+CA+CHOL+GLU...
Cholesterol, Total: 168 mg/dL (ref 100–199)
LDH: 236 IU/L — ABNORMAL HIGH (ref 121–224)
Phosphorus: 3.5 mg/dL (ref 2.8–4.1)
T4, Total: 6.9 ug/dL (ref 4.5–12.0)
Triglycerides: 78 mg/dL (ref 0–149)
Uric Acid: 9.2 mg/dL — ABNORMAL HIGH (ref 3.8–8.4)

## 2021-05-11 LAB — CBC
Hematocrit: 36.9 % — ABNORMAL LOW (ref 37.5–51.0)
Hemoglobin: 12.7 g/dL — ABNORMAL LOW (ref 13.0–17.7)
MCH: 33.6 pg — ABNORMAL HIGH (ref 26.6–33.0)
MCHC: 34.4 g/dL (ref 31.5–35.7)
MCV: 98 fL — ABNORMAL HIGH (ref 79–97)
Platelets: 204 10*3/uL (ref 150–450)
RBC: 3.78 x10E6/uL — ABNORMAL LOW (ref 4.14–5.80)
RDW: 13.9 % (ref 11.6–15.4)
WBC: 7.6 10*3/uL (ref 3.4–10.8)

## 2021-05-11 LAB — PSA TOTAL (REFLEX TO FREE): Prostate Specific Ag, Serum: 2.7 ng/mL (ref 0.0–4.0)

## 2021-05-11 LAB — ALT+CL+CREAT+GGT+K+NA: GGT: 14 IU/L (ref 0–65)

## 2021-05-11 LAB — LIPID PANEL
Chol/HDL Ratio: 2 ratio (ref 0.0–5.0)
HDL: 82 mg/dL (ref >39)
LDL Chol Calc (NIH): 71 mg/dL (ref 0–99)
VLDL Cholesterol Cal: 15 mg/dL (ref 5–40)

## 2021-05-12 ENCOUNTER — Ambulatory Visit: Payer: Medicare HMO

## 2021-05-12 DIAGNOSIS — R262 Difficulty in walking, not elsewhere classified: Secondary | ICD-10-CM | POA: Diagnosis not present

## 2021-05-12 DIAGNOSIS — R2681 Unsteadiness on feet: Secondary | ICD-10-CM

## 2021-05-12 DIAGNOSIS — M6281 Muscle weakness (generalized): Secondary | ICD-10-CM | POA: Diagnosis not present

## 2021-05-12 NOTE — Therapy (Signed)
[place] [facility] ?[place], [place], [address] ?Phone: [phone]   Fax:  [phone] ? ?Physical Therapy Treatment ? ?Patient Details  ?Name: [name] ?MRN: [id number] ?Date of Birth: 1945/01/18 ?Referring Provider (PT): Manuella Ghazi, MD ? ? ?Encounter Date: 05/12/2021 ? ? PT End of Session - 05/12/21 0858   ? ? Visit Number 9   ? Number of Visits 17   ? Date for PT Re-Evaluation 06/07/21   ? Authorization Type [employer]   ? PT Start Time [id number]   ? PT Stop Time 7902   ? PT Time Calculation (min) 43 min   ? Equipment Utilized During Treatment Gait belt   ? Activity Tolerance Patient tolerated treatment well   ? Behavior During Therapy [facility] for tasks assessed/performed   ? ?  ?  ? ?  ? ? ?Past Medical History:  ?Diagnosis Date  ? Arthritis   ? in his fingers  ? Bipolar disorder ([place])   ? BPH (benign prostatic hyperplasia)   ? Cancer [facility]) 2020  ? LUNG CA  ? Depression   ? Emphysema   ? Episodic confusion   ? Possible bipolar disorder  ? GERD (gastroesophageal reflux disease)   ? History of chicken pox   ? History of hiatal hernia   ? HNP (herniated nucleus pulposus), lumbar   ? Hyperlipidemia   ? Memory deficit 07/31/2012  ? OCD (obsessive compulsive disorder)   ? Peripheral neuropathy   ? Restless leg syndrome 01/10/2003  ? Unspecified psychosis 03/01/2012  ? Wears dentures   ? full upper and lower  ? Wears hearing aid in both ears   ? ? ?Past Surgical History:  ?Procedure Laterality Date  ? BACK SURGERY    ? COLONOSCOPY    ? ESOPHAGOGASTRODUODENOSCOPY    ? ESOPHAGOGASTRODUODENOSCOPY (EGD) WITH PROPOFOL [name] 02/28/2019  ? Procedure: ESOPHAGOGASTRODUODENOSCOPY (EGD) WITH DILATION;  Surgeon: Lucilla Lame, MD;  Location: [place];  Service: Endoscopy;  Laterality: [name];  Priority 3  ? FLEXIBLE BRONCHOSCOPY [name] 01/06/2019  ? Procedure: FLEXIBLE BRONCHOSCOPY;  Surgeon: [name], MD;  Location: [facility];  Service: Thoracic;  Laterality: [name];  ?  liver excision    ? for non-cancerous mass  ? LUMBAR LAMINECTOMY/DECOMPRESSION MICRODISCECTOMY Left 06/22/2017  ? Procedure: Extraforaminal Microdiscectomy  - L4-L5 - left;  Surgeon: Eustace Moore, MD;  Location: [name];  Service: Neurosurgery;  Laterality: Left;  ? LUNG SURGERY  2005  ? Lipoma Removal  ? MAXIMUM ACCESS (MAS) TRANSFORAMINAL LUMBAR INTERBODY FUSION (TLIF) 1 LEVEL [name] 02/18/2018  ? Procedure: Transforaminal Lumbar Interbody Fusion - Lumbar Four - Lumbar Five;  Surgeon: Eustace Moore, MD;  Location: [place];  Service: Neurosurgery;  Laterality: [name];  Transforaminal Lumbar Interbody Fusion - Lumbar Four - Lumbar Five  ? THORACOTOMY Right 01/06/2019  ? Procedure: THORACOTOMY MAJOR;  Surgeon: [name], MD;  Location: [facility];  Service: Thoracic;  Laterality: Right;  ? VIDEO ASSISTED THORACOSCOPY (VATS)/WEDGE RESECTION Right 01/06/2019  ? Procedure: VIDEO ASSISTED THORACOSCOPY (VATS)/WEDGE RESECTION-RUL and RLL-lung resection;  Surgeon: [name], MD;  Location: [facility];  Service: Thoracic;  Laterality: Right;  ? XI ROBOTIC ASSISTED PARAESOPHAGEAL HERNIA REPAIR [name] 05/06/2019  ? Procedure: XI ROBOTIC ASSISTED PARAESOPHAGEAL HERNIA REPAIR;  Surgeon: Jules Husbands, MD;  Location: [facility];  Service: General;  Laterality: [name];  ? ? ?There were no vitals filed for this visit. ? ? Subjective Assessment - 05/12/21 0804   ? ?  Subjective Pt reports his week has gone fair. Pt stumbles upon standing up from his chair to walk into therapy, steadies himeslf grabbing onto wall. Pt states "I just give out too much" when walking.   ? Pertinent History 76 yo Male reports imbalance over last several years. He presents to therapy without cane/walker. He reports he is not using any assistive device at this time. He denies any recent falls, but states he has to be careful. He denies any numbness/tingling; He denies any pain; He reports difficulty standing/walking because his "legs give out." He reports not being  active at home. He does still mow the yard (uses a riding lawnmower); He occassionally will go to the grocery store. He is being seen by Dr. Manuella Ghazi (neurologist) for confusion spells. Last episode was in Dec 2022. He is being referred for EEG and MRI to address possible seizure like disorder. He denies any recent spells. He denies any dizziness. PMH significant for Bi-polar disorder, Lung CA (2020, no CA now) does get short of breath sometimes, Hard of hearing (wears hearing aides), history of herniated disc in lumbar spine (no pain currently), Peripheral neuropathy, OCD, memory deficits (Denies any issue), arthritis (mostly hands), GERD, emphysema, BPH;   ? Limitations Standing;Walking   ? How long can you sit comfortably? >1 hour- no difficulty;   ? How long can you stand comfortably? 5-10 min, limited because legs give out.   ? How long can you walk comfortably? a few hundred feet;   ? Diagnostic tests CT scan in Dec 2022 was negative for acute abnormality;   ? Patient Stated Goals "I wish I could walk better."   ? Currently in Pain? No/denies   ? ?  ?  ? ?  ? ? INTERVENTIONS ?  ?  ?Neuro Re-ed ?Bosu ball round side up: ?-lateral modified lunge 12x each LE BUE support ?-modified forward lunge 12x each LE ; BUE support ? ? ?Standing with CGA next to support surface:  ?Airex balance beam:  ?-lateral stepping 5x length of // bars. Pt rates as challenging.  ?-tandem walk 6x length of // bars. Intermittent UE support ? ? ?Orange hurdle: ?Lateral and forward/backward stepping over hurdle x multiple reps of each. Intermittent UE support ? ? ? ?TherEx ?  ?Nustep Lvl 4 seat position 9; cues for SPM> 60 and continued cues for breathing techniques, 4 minutes. Pt completes 0.12 miles. ?  ?Matrix leg press: BLE: #55; 12x; 2 sets. Pt rates medium.  ?  ?Superset:x2 trials  ?Exercise 1: walk 160 ft with CGA cues for sequencing ?Exercise 2: sit to stand 10x no UE support  ?Comments: Pt reports his balance felt fair with gait ?   ?Squat with UE support on bars 12x. Pt rates as challenging.  ? ?Heel raise with UE support 15x. Difficulty with technique ? ?Seated heel raises 15x. Tremulous ?  ?Recovery intervals required throughout.  ? ?Pt educated throughout session about proper posture and technique with exercises. Improved exercise technique, movement at target joints, use of target muscles after min to mod verbal, visual, tactile cues. ?  ? PT Education - 05/12/21 0857   ? ? Education Details exercise technique   ? Person(s) Educated Patient   ? Methods Explanation;Demonstration;Tactile cues;Verbal cues   ? Comprehension Verbalized understanding;Returned demonstration;Verbal cues required;Need further instruction   ? ?  ?  ? ?  ? ? ? PT Short Term Goals - 04/12/21 2053   ? ?  ? PT SHORT TERM GOAL #  1  ? Title Patient will be adherent to HEP at least 3x a week to improve mobility and strength.   ? Time 4   ? Period Weeks   ? Status New   ? Target Date 05/10/21   ?  ? PT SHORT TERM GOAL #2  ? Title Patient will be able to hold SLS for >5 sec to indicate improved safety and mobility when stepping in/out of tub or negotiating steps.   ? Time 4   ? Period Weeks   ? Status New   ? Target Date 05/10/21   ? ?  ?  ? ?  ? ? ? ? PT Long Term Goals - 04/12/21 2054   ? ?  ? PT LONG TERM GOAL #1  ? Title Patient will improve balance as evidenced by increasing Mini Best test to >22/28 to reduce fall risk.   ? Time 8   ? Period Weeks   ? Status New   ? Target Date 06/07/21   ?  ? PT LONG TERM GOAL #2  ? Title Patient will ambulate independently with normal base of support and reciprocal gait pattern for better gait ability at home and in community.   ? Time 8   ? Period Weeks   ? Status New   ? Target Date 06/07/21   ?  ? PT LONG TERM GOAL #3  ? Title Patient will improve FOTO survey score by at least 5% to indicate improved functional mobility with ADLs.   ? Time 8   ? Period Weeks   ? Status New   ? Target Date 06/07/21   ?  ? PT LONG TERM GOAL #4  ?  Title Patient will be able to ambulate 1000 feet in 6 min walk to indicate community ambulator for increased mobility.   ? Time 8   ? Period Weeks   ? Status New   ? Target Date 06/07/21   ?  ? PT LONG TERM GOAL

## 2021-05-17 ENCOUNTER — Ambulatory Visit: Payer: Medicare HMO | Admitting: Physical Therapy

## 2021-05-17 ENCOUNTER — Encounter: Payer: Self-pay | Admitting: Physical Therapy

## 2021-05-17 DIAGNOSIS — R262 Difficulty in walking, not elsewhere classified: Secondary | ICD-10-CM | POA: Diagnosis not present

## 2021-05-17 DIAGNOSIS — R2681 Unsteadiness on feet: Secondary | ICD-10-CM

## 2021-05-17 DIAGNOSIS — M6281 Muscle weakness (generalized): Secondary | ICD-10-CM | POA: Diagnosis not present

## 2021-05-17 NOTE — Therapy (Signed)
[place] [facility] ?[place], [place], [address] ?Phone: [phone]   Fax:  [phone] ? ?Physical Therapy Treatment ?Physical Therapy Progress Note ? ? ?Dates of reporting period  04/12/21   to   05/17/21 ? ? ?Patient Details  ?Name: [name] ?MRN: [id number] ?Date of Birth: 02/05/1945 ?Referring Provider (PT): Manuella Ghazi, MD ? ? ?Encounter Date: 05/17/2021 ? ? PT End of Session - 05/17/21 6962   ? ? Visit Number 10   ? Number of Visits 17   ? Date for PT Re-Evaluation 06/07/21   ? Authorization Type [employer]   ? PT Start Time 0805   ? PT Stop Time 0845   ? PT Time Calculation (min) 40 min   ? Equipment Utilized During Treatment Gait belt   ? Activity Tolerance Patient tolerated treatment well   ? Behavior During Therapy [facility] for tasks assessed/performed   ? ?  ?  ? ?  ? ? ?Past Medical History:  ?Diagnosis Date  ? Arthritis   ? in his fingers  ? Bipolar disorder ([place])   ? BPH (benign prostatic hyperplasia)   ? Cancer [facility]) 2020  ? LUNG CA  ? Depression   ? Emphysema   ? Episodic confusion   ? Possible bipolar disorder  ? GERD (gastroesophageal reflux disease)   ? History of chicken pox   ? History of hiatal hernia   ? HNP (herniated nucleus pulposus), lumbar   ? Hyperlipidemia   ? Memory deficit 07/31/2012  ? OCD (obsessive compulsive disorder)   ? Peripheral neuropathy   ? Restless leg syndrome 01/10/2003  ? Unspecified psychosis 03/01/2012  ? Wears dentures   ? full upper and lower  ? Wears hearing aid in both ears   ? ? ?Past Surgical History:  ?Procedure Laterality Date  ? BACK SURGERY    ? COLONOSCOPY    ? ESOPHAGOGASTRODUODENOSCOPY    ? ESOPHAGOGASTRODUODENOSCOPY (EGD) WITH PROPOFOL [name] 02/28/2019  ? Procedure: ESOPHAGOGASTRODUODENOSCOPY (EGD) WITH DILATION;  Surgeon: Lucilla Lame, MD;  Location: [place];  Service: Endoscopy;  Laterality: [name];  Priority 3  ? FLEXIBLE BRONCHOSCOPY [name] 01/06/2019  ? Procedure: FLEXIBLE BRONCHOSCOPY;   Surgeon: [name], MD;  Location: [facility];  Service: Thoracic;  Laterality: [name];  ? liver excision    ? for non-cancerous mass  ? LUMBAR LAMINECTOMY/DECOMPRESSION MICRODISCECTOMY Left 06/22/2017  ? Procedure: Extraforaminal Microdiscectomy  - L4-L5 - left;  Surgeon: Eustace Moore, MD;  Location: [place];  Service: Neurosurgery;  Laterality: Left;  ? LUNG SURGERY  2005  ? Lipoma Removal  ? MAXIMUM ACCESS (MAS) TRANSFORAMINAL LUMBAR INTERBODY FUSION (TLIF) 1 LEVEL [name] 02/18/2018  ? Procedure: Transforaminal Lumbar Interbody Fusion - Lumbar Four - Lumbar Five;  Surgeon: Eustace Moore, MD;  Location: [place];  Service: Neurosurgery;  Laterality: [name];  Transforaminal Lumbar Interbody Fusion - Lumbar Four - Lumbar Five  ? THORACOTOMY Right 01/06/2019  ? Procedure: THORACOTOMY MAJOR;  Surgeon: [name], MD;  Location: [facility];  Service: Thoracic;  Laterality: Right;  ? VIDEO ASSISTED THORACOSCOPY (VATS)/WEDGE RESECTION Right 01/06/2019  ? Procedure: VIDEO ASSISTED THORACOSCOPY (VATS)/WEDGE RESECTION-RUL and RLL-lung resection;  Surgeon: [name], MD;  Location: [facility];  Service: Thoracic;  Laterality: Right;  ? XI ROBOTIC ASSISTED PARAESOPHAGEAL HERNIA REPAIR [name] 05/06/2019  ? Procedure: XI ROBOTIC ASSISTED PARAESOPHAGEAL HERNIA REPAIR;  Surgeon: Jules Husbands, MD;  Location: [facility];  Service: General;  Laterality: [name];  ? ? ?  There were no vitals filed for this visit. ? ? Subjective Assessment - 05/17/21 0811   ? ? Subjective Pt reports increased stiffness today in his legs. Denies any pain. Reports adherence with HEP- doing them 30 min each day;   ? Pertinent History 76 yo Male reports imbalance over last several years. He presents to therapy without cane/walker. He reports he is not using any assistive device at this time. He denies any recent falls, but states he has to be careful. He denies any numbness/tingling; He denies any pain; He reports difficulty standing/walking because his "legs give out." He  reports not being active at home. He does still mow the yard (uses a riding lawnmower); He occassionally will go to the grocery store. He is being seen by Dr. Manuella Ghazi (neurologist) for confusion spells. Last episode was in Dec 2022. He is being referred for EEG and MRI to address possible seizure like disorder. He denies any recent spells. He denies any dizziness. PMH significant for Bi-polar disorder, Lung CA (2020, no CA now) does get short of breath sometimes, Hard of hearing (wears hearing aides), history of herniated disc in lumbar spine (no pain currently), Peripheral neuropathy, OCD, memory deficits (Denies any issue), arthritis (mostly hands), GERD, emphysema, BPH;   ? Limitations Standing;Walking   ? How long can you sit comfortably? >1 hour- no difficulty;   ? How long can you stand comfortably? 5-10 min, limited because legs give out.   ? How long can you walk comfortably? a few hundred feet;   ? Diagnostic tests CT scan in Dec 2022 was negative for acute abnormality;   ? Patient Stated Goals "I wish I could walk better."   ? Currently in Pain? No/denies   ? Multiple Pain Sites No   ? ?  ?  ? ?  ? ? ?TherEx ?  ?Nustep Lvl 2-4 seat position 10; cues for SPM> 60 and continued cues for breathing techniques, 4 minutes. Pt completes 0.15 miles. ?  ?Matrix leg press: BLE: #55; 12x; 2 sets. Pt rates medium.  ? ? [facility] PT Assessment - 05/17/21 0001   ? ?  ? Observation/Other Assessments  ? Focus on Therapeutic Outcomes (FOTO)  54%   ?  ? 6 Minute Walk- Baseline  ? 6 Minute Walk- Baseline yes   ? BP (mmHg) 131/80   ? HR (bpm) 77   ? 02 Sat (%RA) 100 %   ? Modified Borg Scale for Dyspnea 0- Nothing at all   ?  ? 6 Minute walk- Post Test  ? 6 Minute Walk Post Test yes   ? BP (mmHg) 139/75   ? HR (bpm) 91   ? 02 Sat (%RA) 99 %   ? Modified Borg Scale for Dyspnea 3- Moderate shortness of breath or breathing difficulty   ?  ? 6 minute walk test results   ? Aerobic Endurance Distance Walked 1035   ?  ? Mini-BESTest  ? Sit  To Stand Normal: Comes to stand without use of hands and stabilizes independently.   ? Rise to Toes Moderate: Heels up, but not full range (smaller than when holding hands), OR noticeable instability for 3 s.   ? Stand on one leg (left) Moderate: < 20 s   ? Stand on one leg (right) Moderate: < 20 s   ? Stand on one leg - lowest score 1   ? Compensatory Stepping Correction - Forward Moderate: More than one step is required to recover equilibrium   ?  Compensatory Stepping Correction - Backward No step, OR would fall if not caught, OR falls spontaneously.   ? Compensatory Stepping Correction - Left Lateral Severe: Falls, or cannot step   ? Compensatory Stepping Correction - Right Lateral Severe:  Falls, or cannot step   ? Stepping Corredtion Lateral - lowest score 0   ? Stance - Feet together, eyes open, firm surface  Normal: 30s   ? Stance - Feet together, eyes closed, foam surface  Moderate: < 30s   ? Incline - Eyes Closed Normal: Stands independently 30s and aligns with gravity   ? Change in Gait Speed Normal: Significantly changes walkling speed without imbalance   ? Walk with head turns - Horizontal Normal: performs head turns with no change in gait speed and good balance   ? Walk with pivot turns Normal: Turns with feet close FAST (< 3 steps) with good balance.   ? Step over obstacles Moderate: Steps over box but touches box OR displays cautious behavior by slowing gait.   ? Timed UP & GO with Dual Task Moderate: Dual Task affects either counting OR walking (>10%) when compared to the TUG without Dual Task.   ? Mini-BEST total score 18   ? ?  ?  ? ?  ?Instructed patient in outcome measures to address progress towards goals: see above; ? ? ?Patient's condition has the potential to improve in response to therapy. Maximum improvement is yet to be obtained. The anticipated improvement is attainable and reasonable in a generally predictable time.  Patient reports adherence with HEP; He hasn't fallen for 3-4 weeks.  Patient reports still feeling unsteady and unbalanced;   ? ? ? ? ? ? ? ? ? ? ? ? ? ? ? ? ? ? ? ? ? ? ? ? PT Education - 05/17/21 0812   ? ? Education Details exercise technique/progress towards goals;   ?

## 2021-05-19 ENCOUNTER — Ambulatory Visit: Payer: Medicare HMO

## 2021-05-19 DIAGNOSIS — R262 Difficulty in walking, not elsewhere classified: Secondary | ICD-10-CM | POA: Diagnosis not present

## 2021-05-19 DIAGNOSIS — M6281 Muscle weakness (generalized): Secondary | ICD-10-CM

## 2021-05-19 DIAGNOSIS — R2681 Unsteadiness on feet: Secondary | ICD-10-CM

## 2021-05-19 NOTE — Therapy (Signed)
[place] [facility] ?[place], [place], [address] ?Phone: [phone]   Fax:  [phone] ? ?Physical Therapy Treatment ? ?Patient Details  ?Name: [name] ?MRN: [id number] ?Date of Birth: 12-01-1945 ?Referring Provider (PT): Manuella Ghazi, MD ? ? ?Encounter Date: 05/19/2021 ? ? PT End of Session - 05/19/21 0758   ? ? Visit Number 11   ? Number of Visits 17   ? Date for PT Re-Evaluation 06/07/21   ? Authorization Type [employer]   ? PT Start Time 0800   ? PT Stop Time 0844   ? PT Time Calculation (min) 44 min   ? Equipment Utilized During Treatment Gait belt   ? Activity Tolerance Patient tolerated treatment well   ? Behavior During Therapy [facility] for tasks assessed/performed   ? ?  ?  ? ?  ? ? ?Past Medical History:  ?Diagnosis Date  ? Arthritis   ? in his fingers  ? Bipolar disorder ([place])   ? BPH (benign prostatic hyperplasia)   ? Cancer [facility]) 2020  ? LUNG CA  ? Depression   ? Emphysema   ? Episodic confusion   ? Possible bipolar disorder  ? GERD (gastroesophageal reflux disease)   ? History of chicken pox   ? History of hiatal hernia   ? HNP (herniated nucleus pulposus), lumbar   ? Hyperlipidemia   ? Memory deficit 07/31/2012  ? OCD (obsessive compulsive disorder)   ? Peripheral neuropathy   ? Restless leg syndrome 01/10/2003  ? Unspecified psychosis 03/01/2012  ? Wears dentures   ? full upper and lower  ? Wears hearing aid in both ears   ? ? ?Past Surgical History:  ?Procedure Laterality Date  ? BACK SURGERY    ? COLONOSCOPY    ? ESOPHAGOGASTRODUODENOSCOPY    ? ESOPHAGOGASTRODUODENOSCOPY (EGD) WITH PROPOFOL [name] 02/28/2019  ? Procedure: ESOPHAGOGASTRODUODENOSCOPY (EGD) WITH DILATION;  Surgeon: Lucilla Lame, MD;  Location: [place];  Service: Endoscopy;  Laterality: [name];  Priority 3  ? FLEXIBLE BRONCHOSCOPY [name] 01/06/2019  ? Procedure: FLEXIBLE BRONCHOSCOPY;  Surgeon: [name], MD;  Location: [facility];  Service: Thoracic;  Laterality: [name];   ? liver excision    ? for non-cancerous mass  ? LUMBAR LAMINECTOMY/DECOMPRESSION MICRODISCECTOMY Left 06/22/2017  ? Procedure: Extraforaminal Microdiscectomy  - L4-L5 - left;  Surgeon: Eustace Moore, MD;  Location: [place];  Service: Neurosurgery;  Laterality: Left;  ? LUNG SURGERY  2005  ? Lipoma Removal  ? MAXIMUM ACCESS (MAS) TRANSFORAMINAL LUMBAR INTERBODY FUSION (TLIF) 1 LEVEL [name] 02/18/2018  ? Procedure: Transforaminal Lumbar Interbody Fusion - Lumbar Four - Lumbar Five;  Surgeon: Eustace Moore, MD;  Location: [place];  Service: Neurosurgery;  Laterality: [name];  Transforaminal Lumbar Interbody Fusion - Lumbar Four - Lumbar Five  ? THORACOTOMY Right 01/06/2019  ? Procedure: THORACOTOMY MAJOR;  Surgeon: [name], MD;  Location: [facility];  Service: Thoracic;  Laterality: Right;  ? VIDEO ASSISTED THORACOSCOPY (VATS)/WEDGE RESECTION Right 01/06/2019  ? Procedure: VIDEO ASSISTED THORACOSCOPY (VATS)/WEDGE RESECTION-RUL and RLL-lung resection;  Surgeon: [name], MD;  Location: [facility];  Service: Thoracic;  Laterality: Right;  ? XI ROBOTIC ASSISTED PARAESOPHAGEAL HERNIA REPAIR [name] 05/06/2019  ? Procedure: XI ROBOTIC ASSISTED PARAESOPHAGEAL HERNIA REPAIR;  Surgeon: Jules Husbands, MD;  Location: [facility];  Service: General;  Laterality: [name];  ? ? ?There were no vitals filed for this visit. ? ? Subjective Assessment - 05/19/21 0801   ? ?  Subjective Patient reports he doesn't know if he will ever get his balance back. Reports compliance with HEP.   ? Pertinent History 76 yo Male reports imbalance over last several years. He presents to therapy without cane/walker. He reports he is not using any assistive device at this time. He denies any recent falls, but states he has to be careful. He denies any numbness/tingling; He denies any pain; He reports difficulty standing/walking because his "legs give out." He reports not being active at home. He does still mow the yard (uses a riding lawnmower); He occassionally will  go to the grocery store. He is being seen by Dr. Manuella Ghazi (neurologist) for confusion spells. Last episode was in Dec 2022. He is being referred for EEG and MRI to address possible seizure like disorder. He denies any recent spells. He denies any dizziness. PMH significant for Bi-polar disorder, Lung CA (2020, no CA now) does get short of breath sometimes, Hard of hearing (wears hearing aides), history of herniated disc in lumbar spine (no pain currently), Peripheral neuropathy, OCD, memory deficits (Denies any issue), arthritis (mostly hands), GERD, emphysema, BPH;   ? Limitations Standing;Walking   ? How long can you sit comfortably? >1 hour- no difficulty;   ? How long can you stand comfortably? 5-10 min, limited because legs give out.   ? How long can you walk comfortably? a few hundred feet;   ? Diagnostic tests CT scan in Dec 2022 was negative for acute abnormality;   ? Patient Stated Goals "I wish I could walk better."   ? Currently in Pain? No/denies   ? ?  ?  ? ?  ? ? ? ? ? ? ?Neuro Re-ed ?Standing with CGA next to support surface:  ?Airex pad: static stand 30 seconds x 2 trials, noticeable trembling of ankles/LE's with fatigue and challenge to maintain stability ?Airex pad: horizontal head turns 30 seconds scanning room 10x ; cueing for arc of motion  ?Airex pad: vertical head turns 30 seconds, cueing for arc of motion, noticeable sway with upward gaze increasing demand on ankle righting reaction musculature ?Airex pad: one foot on 6" step one foot on airex pad, hold position for 30 seconds, switch legs, 2x each LE; ? ?Bosu ball round side up: ?-lateral modified lunge 10x each LE BUE support ?-modified forward lunge 10x each LE ; BUE support ?  ?Orange hurdle: ?-single limb anterior/posterior step back 10x each LE with close CGA  ?  ?Standing with CGA next to support surface:  ?Airex balance beam:  ?-lateral stepping 6x length of // bars ?-tandem walk 4x length of // bars ?-pool noodle chest press 10x    ? ?TherEx ?  ?Nustep Lvl 4 seat position 8; cues for SPM> 60 and breathing techniques, 4 minutes ?  ?Matrix leg press: BLE: #55; 10x; 2 sets cues for sequencing and slowing down movement for optimal muscle recruitment ?  ? ambulate 160 ft with CGA and cues for heel strike ?10x STS from standard height chair.  ? ?High knee march with 2lb ankle weights: 4x length of // bars ?Lateral step 4x length of // bars with 2lb ankle weights. ?Seated LAQ 10x 3 second.  ?Heel raise with UE support 15x  ?  ?Pt educated throughout session about proper posture and technique with exercises. Improved exercise technique, movement at target joints, use of target muscles after min to mod verbal, visual, tactile cues. ? ? ? ? ? ? ?Patient presents to physical therapy with excellent motivation.  He does limited ankle righting reactions on unstable surfaces that improves with cueing for upright posture and finding COM. He would benefit from additional skilled PT intervention to improve strength, balance and mobility; ? ? ? ? ? ? ? ? ? ? ? ? ? ? ? PT Education - 05/19/21 0758   ? ? Education Details exercise technique, body mechanics   ? Person(s) Educated Patient   ? Methods Explanation;Demonstration;Tactile cues;Verbal cues   ? Comprehension Verbalized understanding;Returned demonstration;Verbal cues required;Tactile cues required   ? ?  ?  ? ?  ? ? ? PT Short Term Goals - 05/17/21 0812   ? ?  ? PT SHORT TERM GOAL #1  ? Title Patient will be adherent to HEP at least 3x a week to improve mobility and strength.   ? Baseline 5/9: does 30 min each day;   ? Time 4   ? Period Weeks   ? Status New   ? Target Date 05/10/21   ?  ? PT SHORT TERM GOAL #2  ? Title Patient will be able to hold SLS for >5 sec to indicate improved safety and mobility when stepping in/out of tub or negotiating steps.   ? Baseline 5/9: R: 5 sec, L: 2 sec   ? Time 4   ? Period Weeks   ? Status Partially Met   ? Target Date 05/10/21   ? ?  ?  ? ?  ? ? ? ? PT Long Term Goals  - 05/17/21 0813   ? ?  ? PT LONG TERM GOAL #1  ? Title Patient will improve balance as evidenced by increasing Mini Best test to >22/28 to reduce fall risk.   ? Baseline 5/9: 18/28   ? Time 8   ? Period Weeks

## 2021-05-24 ENCOUNTER — Encounter: Payer: Self-pay | Admitting: Family Medicine

## 2021-05-24 ENCOUNTER — Ambulatory Visit: Payer: Medicare HMO | Admitting: Physical Therapy

## 2021-05-24 DIAGNOSIS — R262 Difficulty in walking, not elsewhere classified: Secondary | ICD-10-CM | POA: Diagnosis not present

## 2021-05-24 DIAGNOSIS — M6281 Muscle weakness (generalized): Secondary | ICD-10-CM | POA: Diagnosis not present

## 2021-05-24 DIAGNOSIS — R2681 Unsteadiness on feet: Secondary | ICD-10-CM

## 2021-05-24 DIAGNOSIS — E790 Hyperuricemia without signs of inflammatory arthritis and tophaceous disease: Secondary | ICD-10-CM | POA: Insufficient documentation

## 2021-05-24 NOTE — Therapy (Signed)
[facility] ?[place], [place], [address] ?Phone: [phone]   Fax:  [phone] ? ?Physical Therapy Treatment/Discharge summary ? ?Patient Details  ?Name: [name] ?MRN: [id number] ?Date of Birth: 09-02-1945 ?Referring Provider (PT): Manuella Ghazi, MD ? ? ?Encounter Date: 05/24/2021 ? ? PT End of Session - 05/24/21 0845   ? ? Visit Number 12   ? Number of Visits 17   ? Date for PT Re-Evaluation 06/07/21   ? Authorization Type [employer]   ? PT Start Time [phone]   ? PT Stop Time 0845   ? PT Time Calculation (min) 41 min   ? Equipment Utilized During Treatment Gait belt   ? Activity Tolerance Patient tolerated treatment well   ? Behavior During Therapy [facility] for tasks assessed/performed   ? ?  ?  ? ?  ? ? ?Past Medical History:  ?Diagnosis Date  ? Arthritis   ? in his fingers  ? Bipolar disorder ([place])   ? BPH (benign prostatic hyperplasia)   ? Cancer [facility]) 2020  ? LUNG CA  ? Depression   ? Emphysema   ? Episodic confusion   ? Possible bipolar disorder  ? GERD (gastroesophageal reflux disease)   ? History of chicken pox   ? History of hiatal hernia   ? HNP (herniated nucleus pulposus), lumbar   ? Hyperlipidemia   ? Memory deficit 07/31/2012  ? OCD (obsessive compulsive disorder)   ? Peripheral neuropathy   ? Restless leg syndrome 01/10/2003  ? Unspecified psychosis 03/01/2012  ? Wears dentures   ? full upper and lower  ? Wears hearing aid in both ears   ? ? ?Past Surgical History:  ?Procedure Laterality Date  ? BACK SURGERY    ? COLONOSCOPY    ? ESOPHAGOGASTRODUODENOSCOPY    ? ESOPHAGOGASTRODUODENOSCOPY (EGD) WITH PROPOFOL [name] 02/28/2019  ? Procedure: ESOPHAGOGASTRODUODENOSCOPY (EGD) WITH DILATION;  Surgeon: Lucilla Lame, MD;  Location: [place];  Service: Endoscopy;  Laterality: [name];  Priority 3  ? FLEXIBLE BRONCHOSCOPY [name] 01/06/2019  ? Procedure: FLEXIBLE BRONCHOSCOPY;  Surgeon: [name], MD;  Location: [facility];  Service: Thoracic;   Laterality: [name];  ? liver excision    ? for non-cancerous mass  ? LUMBAR LAMINECTOMY/DECOMPRESSION MICRODISCECTOMY Left 06/22/2017  ? Procedure: Extraforaminal Microdiscectomy  - L4-L5 - left;  Surgeon: Eustace Moore, MD;  Location: [place];  Service: Neurosurgery;  Laterality: Left;  ? LUNG SURGERY  2005  ? Lipoma Removal  ? MAXIMUM ACCESS (MAS) TRANSFORAMINAL LUMBAR INTERBODY FUSION (TLIF) 1 LEVEL [name] 02/18/2018  ? Procedure: Transforaminal Lumbar Interbody Fusion - Lumbar Four - Lumbar Five;  Surgeon: Eustace Moore, MD;  Location: [place];  Service: Neurosurgery;  Laterality: [name];  Transforaminal Lumbar Interbody Fusion - Lumbar Four - Lumbar Five  ? THORACOTOMY Right 01/06/2019  ? Procedure: THORACOTOMY MAJOR;  Surgeon: [name], MD;  Location: [facility];  Service: Thoracic;  Laterality: Right;  ? VIDEO ASSISTED THORACOSCOPY (VATS)/WEDGE RESECTION Right 01/06/2019  ? Procedure: VIDEO ASSISTED THORACOSCOPY (VATS)/WEDGE RESECTION-RUL and RLL-lung resection;  Surgeon: [name], MD;  Location: [facility];  Service: Thoracic;  Laterality: Right;  ? XI ROBOTIC ASSISTED PARAESOPHAGEAL HERNIA REPAIR [name] 05/06/2019  ? Procedure: XI ROBOTIC ASSISTED PARAESOPHAGEAL HERNIA REPAIR;  Surgeon: Jules Husbands, MD;  Location: [facility];  Service: General;  Laterality: [name];  ? ? ?There were no vitals filed for this visit. ? ? Subjective Assessment - 05/24/21 0844   ? ?  Subjective Patient reports, "I want today to be my last visit." He reports wanting to work on his exercises at home.   ? Pertinent History 76 yo Male reports imbalance over last several years. He presents to therapy without cane/walker. He reports he is not using any assistive device at this time. He denies any recent falls, but states he has to be careful. He denies any numbness/tingling; He denies any pain; He reports difficulty standing/walking because his "legs give out." He reports not being active at home. He does still mow the yard (uses a riding  lawnmower); He occassionally will go to the grocery store. He is being seen by Dr. Manuella Ghazi (neurologist) for confusion spells. Last episode was in Dec 2022. He is being referred for EEG and MRI to address possible seizure like disorder. He denies any recent spells. He denies any dizziness. PMH significant for Bi-polar disorder, Lung CA (2020, no CA now) does get short of breath sometimes, Hard of hearing (wears hearing aides), history of herniated disc in lumbar spine (no pain currently), Peripheral neuropathy, OCD, memory deficits (Denies any issue), arthritis (mostly hands), GERD, emphysema, BPH;   ? Limitations Standing;Walking   ? How long can you sit comfortably? >1 hour- no difficulty;   ? How long can you stand comfortably? 5-10 min, limited because legs give out.   ? How long can you walk comfortably? a few hundred feet;   ? Diagnostic tests CT scan in Dec 2022 was negative for acute abnormality;   ? Patient Stated Goals "I wish I could walk better."   ? Currently in Pain? No/denies   ? Multiple Pain Sites No   ? ?  ?  ? ?  ? ? ? ? ?TREATMENT: ?Educated patient in gait on treadmill 1.0 mph with 2 HHA x5 min with education on safety awareness including using safety off switch; Educated patient in how to safely increase time for better cardiovascular endurance, see patient instructions; ?Patient verbalized understanding; ? ?Patient instructed in advanced HEP: ?Standing with green tband around BLE: ?-hip abduction x10 reps ?-hip extension x10 reps ?-hip flexion x10 reps ?-side stepping x8 feet x5 laps each direction ?Patient able to safely don/doff tband independently; ?Able to exhibit good control, does report moderate difficulty with exercise ? ?Instructed patient in advanced balance exercise: ?-tandem stance with rail assist 30 sec hold x1 rep each LE in front ?-SLS on firm surface with rail assist 30 sec hold x1 rep each LE ?-backward walking x8 feet x5 laps ?-BLE heel raises x10 reps ?Sit<>stand from chair x5  reps ? ?Provided written HEP for better adherence ?Patient verbalized understanding; ? ?Goals were recently updated on 05/17/21 ? ? ? ? ? ? ? ? ? ? ? ? ? ? ? ? ? ? ? ? ? ? ? ? ? PT Education - 05/24/21 0844   ? ? Education Details exercise technique/HEP   ? Person(s) Educated Patient   ? Methods Explanation;Verbal cues;Handout   ? Comprehension Verbalized understanding;Returned demonstration;Verbal cues required;Need further instruction   ? ?  ?  ? ?  ? ? ? PT Short Term Goals - 05/17/21 0812   ? ?  ? PT SHORT TERM GOAL #1  ? Title Patient will be adherent to HEP at least 3x a week to improve mobility and strength.   ? Baseline 5/9: does 30 min each day;   ? Time 4   ? Period Weeks   ? Status New   ? Target Date 05/10/21   ?  ?  PT SHORT TERM GOAL #2  ? Title Patient will be able to hold SLS for >5 sec to indicate improved safety and mobility when stepping in/out of tub or negotiating steps.   ? Baseline 5/9: R: 5 sec, L: 2 sec   ? Time 4   ? Period Weeks   ? Status Partially Met   ? Target Date 05/10/21   ? ?  ?  ? ?  ? ? ? ? PT Long Term Goals - 05/17/21 0813   ? ?  ? PT LONG TERM GOAL #1  ? Title Patient will improve balance as evidenced by increasing Mini Best test to >22/28 to reduce fall risk.   ? Baseline 5/9: 18/28   ? Time 8   ? Period Weeks   ? Status Not Met   ? Target Date 06/07/21   ?  ? PT LONG TERM GOAL #2  ? Title Patient will ambulate independently with normal base of support and reciprocal gait pattern for better gait ability at home and in community.   ? Baseline 5/9: wide base of support, reciprocla pattern   ? Time 8   ? Period Weeks   ? Status Partially Met   ? Target Date 06/07/21   ?  ? PT LONG TERM GOAL #3  ? Title Patient will improve FOTO survey score by at least 5% to indicate improved functional mobility with ADLs.   ? Baseline 5/9: 54%   ? Time 8   ? Period Weeks   ? Status Partially Met   ? Target Date 06/07/21   ?  ? PT LONG TERM GOAL #4  ? Title Patient will be able to ambulate 1000  feet in 6 min walk to indicate community ambulator for increased mobility.   ? Baseline 5/9: 1035 feet   ? Time 8   ? Period Weeks   ? Status Achieved   ? Target Date 06/07/21   ?  ? PT LONG TERM GOAL #5  ? Title

## 2021-05-24 NOTE — Patient Instructions (Addendum)
WALKING ? ?Walking is a great form of exercise to increase your strength, endurance and overall fitness.  A walking program can help you start slowly and gradually build endurance as you go.  Everyone's ability is different, so each person's starting point will be different.  You do not have to follow them exactly.  The are just samples. You should simply find out what's right for you and stick to that program.   ? ?Walk 5 days a week ? ?Start with walking 5 min each day ? ?Each week increase your time by 2 min to build up your endurance ? ?Ex-  week 1, walk 5 min a day, 5 days a week ? Week 2, walk 7 min a day, 5 days a week ? Week 3 walk 9-10 min a day, 5 days a week ? Week 4 walk 12 min a day 5 days a week ? ?Keep increasing your time until you are walking 20-30 min a day, 5 days a week ? ?Start at 1.0 mph ? ?Be sure to use the emergency clip for safety when walking on treadmill and hold onto railings for safety ? ?If you start to get dizzy or feel very short of breath/light headed, then stop and take a break;  ? ? ?Access Code: [email or web address] ?URL: [email or web address] ?Date: 05/24/2021 ?Prepared by: Blanche East ? ?Exercises ?- Standing Tandem Balance with Counter Support  - 1 x daily - 7 x weekly - 2 sets - 2 reps - 30 hold ?- Standing Single Leg Stance with Counter Support  - 1 x daily - 7 x weekly - 2 sets - 2 reps - 30 hold ?- Backward Walking with Counter Support  - 1 x daily - 7 x weekly - 2 sets - 5 reps ?- Heel Raises with Counter Support  - 1 x daily - 7 x weekly - 2 sets - 10 reps ?- Sit to Stand  - 1 x daily - 7 x weekly - 1 sets - 5 reps ? ? ?Access Code: [email or web address] ?URL: [email or web address] ?Date: 05/24/2021 ?Prepared by: Blanche East ? ?Exercises ?- Standing Hip Abduction with Resistance at Ankles and Counter Support  - 1 x daily - 3 x weekly - 2 sets - 10 reps ?- Standing Hip Extension with Resistance at Ankles and Counter Support  - 1 x daily - 3 x weekly - 2 sets  - 10 reps ?- Standing Hip Flexion with Resistance at Ankles and Counter Support  - 1 x daily - 3 x weekly - 2 sets - 10 reps ?- Side Stepping with Resistance at Ankles and Counter Support  - 1 x daily - 3 x weekly - 2 sets - 10 reps ?

## 2021-05-26 ENCOUNTER — Ambulatory Visit: Payer: Medicare HMO

## 2021-05-26 DIAGNOSIS — F3172 Bipolar disorder, in full remission, most recent episode hypomanic: Secondary | ICD-10-CM | POA: Diagnosis not present

## 2021-05-30 DIAGNOSIS — H353231 Exudative age-related macular degeneration, bilateral, with active choroidal neovascularization: Secondary | ICD-10-CM | POA: Diagnosis not present

## 2021-05-31 ENCOUNTER — Ambulatory Visit: Payer: Medicare HMO

## 2021-06-02 ENCOUNTER — Ambulatory Visit: Payer: Medicare HMO

## 2021-06-02 DIAGNOSIS — F3172 Bipolar disorder, in full remission, most recent episode hypomanic: Secondary | ICD-10-CM | POA: Diagnosis not present

## 2021-06-07 ENCOUNTER — Ambulatory Visit: Payer: Medicare HMO | Admitting: Physical Therapy

## 2021-06-09 ENCOUNTER — Ambulatory Visit: Payer: Medicare HMO | Admitting: Physical Therapy

## 2021-06-14 ENCOUNTER — Ambulatory Visit: Payer: Medicare HMO | Admitting: Physical Therapy

## 2021-06-16 ENCOUNTER — Ambulatory Visit: Payer: Medicare HMO

## 2021-06-21 ENCOUNTER — Ambulatory Visit: Payer: Medicare HMO | Admitting: Physical Therapy

## 2021-06-23 ENCOUNTER — Ambulatory Visit: Payer: Medicare HMO | Admitting: Physical Therapy

## 2021-06-28 ENCOUNTER — Ambulatory Visit: Payer: Medicare HMO | Admitting: Physical Therapy

## 2021-06-30 ENCOUNTER — Ambulatory Visit: Payer: Medicare HMO | Admitting: Physical Therapy

## 2021-07-01 DIAGNOSIS — H353231 Exudative age-related macular degeneration, bilateral, with active choroidal neovascularization: Secondary | ICD-10-CM | POA: Diagnosis not present

## 2021-07-05 ENCOUNTER — Ambulatory Visit: Payer: Medicare HMO | Admitting: Physical Therapy

## 2021-07-06 ENCOUNTER — Telehealth: Payer: Self-pay | Admitting: Family Medicine

## 2021-07-06 NOTE — Telephone Encounter (Signed)
Copied from [place] [id number]. Topic: Medicare AWV >> Jul 06, 2021  2:57 PM Jae Dire wrote: Reason for CRM:  Left message for patient to call back and schedule Medicare Annual Wellness Visit (AWV) in office.   If unable to come into the office for AWV,  please offer to do virtually or by telephone.  Last AWV: 02/13/2020  Please schedule at anytime with [facility] Health Advisor.  30 minute appointment for Virtual or phone 45 minute appointment for in office or Initial virtual/phone  Any questions, please contact me at [phone]

## 2021-07-07 ENCOUNTER — Ambulatory Visit: Payer: Medicare HMO | Admitting: Physical Therapy

## 2021-08-05 DIAGNOSIS — H353231 Exudative age-related macular degeneration, bilateral, with active choroidal neovascularization: Secondary | ICD-10-CM | POA: Diagnosis not present

## 2021-09-05 DIAGNOSIS — H353231 Exudative age-related macular degeneration, bilateral, with active choroidal neovascularization: Secondary | ICD-10-CM | POA: Diagnosis not present

## 2021-09-22 DIAGNOSIS — F3172 Bipolar disorder, in full remission, most recent episode hypomanic: Secondary | ICD-10-CM | POA: Diagnosis not present

## 2021-10-06 DIAGNOSIS — H353231 Exudative age-related macular degeneration, bilateral, with active choroidal neovascularization: Secondary | ICD-10-CM | POA: Diagnosis not present

## 2021-11-07 DIAGNOSIS — H353231 Exudative age-related macular degeneration, bilateral, with active choroidal neovascularization: Secondary | ICD-10-CM | POA: Diagnosis not present

## 2021-12-08 DIAGNOSIS — H353231 Exudative age-related macular degeneration, bilateral, with active choroidal neovascularization: Secondary | ICD-10-CM | POA: Diagnosis not present

## 2021-12-15 DIAGNOSIS — Z0100 Encounter for examination of eyes and vision without abnormal findings: Secondary | ICD-10-CM | POA: Diagnosis not present

## 2021-12-15 DIAGNOSIS — H52223 Regular astigmatism, bilateral: Secondary | ICD-10-CM | POA: Diagnosis not present

## 2021-12-15 DIAGNOSIS — Z9841 Cataract extraction status, right eye: Secondary | ICD-10-CM | POA: Diagnosis not present

## 2021-12-15 DIAGNOSIS — Z9842 Cataract extraction status, left eye: Secondary | ICD-10-CM | POA: Diagnosis not present

## 2022-01-12 DIAGNOSIS — H353231 Exudative age-related macular degeneration, bilateral, with active choroidal neovascularization: Secondary | ICD-10-CM | POA: Diagnosis not present

## 2022-01-19 DIAGNOSIS — F3172 Bipolar disorder, in full remission, most recent episode hypomanic: Secondary | ICD-10-CM | POA: Diagnosis not present

## 2022-01-19 LAB — LIPID PANEL
Cholesterol: 156 (ref 0–200)
HDL: 53 (ref 35–70)
LDL Cholesterol: 86
Triglycerides: 90 (ref 40–160)

## 2022-01-19 LAB — HEMOGLOBIN A1C
EGFR: 74
Hemoglobin A1C: 6.3

## 2022-01-20 DIAGNOSIS — F3172 Bipolar disorder, in full remission, most recent episode hypomanic: Secondary | ICD-10-CM | POA: Diagnosis not present

## 2022-01-30 ENCOUNTER — Ambulatory Visit
Admission: RE | Admit: 2022-01-30 | Discharge: 2022-01-30 | Disposition: A | Discharge status: Home / Self Care | Payer: PPO | Source: Ambulatory Visit | Attending: Internal Medicine | Admitting: Internal Medicine

## 2022-01-30 DIAGNOSIS — C3490 Malignant neoplasm of unspecified part of unspecified bronchus or lung: Secondary | ICD-10-CM | POA: Diagnosis not present

## 2022-01-30 DIAGNOSIS — C3411 Malignant neoplasm of upper lobe, right bronchus or lung: Secondary | ICD-10-CM | POA: Diagnosis not present

## 2022-01-30 DIAGNOSIS — R918 Other nonspecific abnormal finding of lung field: Secondary | ICD-10-CM | POA: Diagnosis not present

## 2022-01-30 DIAGNOSIS — J432 Centrilobular emphysema: Secondary | ICD-10-CM | POA: Diagnosis not present

## 2022-01-31 ENCOUNTER — Telehealth: Payer: Self-pay | Admitting: *Deleted

## 2022-01-31 NOTE — Telephone Encounter (Signed)
RN made phone call to check in with patient. I spoke with his sister Danella Sensing. I told her the CT may show some bronchitis. She said he is not complaining of cough, fever or dyspnea. He has an appt Thurs with Dr B. Please call the clinic if he developes any symptoms before his appt. Sister agreed that she would notify us.

## 2022-02-02 ENCOUNTER — Encounter: Payer: Self-pay | Admitting: Internal Medicine

## 2022-02-02 ENCOUNTER — Inpatient Hospital Stay: Payer: PPO | Attending: Internal Medicine

## 2022-02-02 ENCOUNTER — Inpatient Hospital Stay (HOSPITAL_BASED_OUTPATIENT_CLINIC_OR_DEPARTMENT_OTHER): Payer: PPO | Admitting: Internal Medicine

## 2022-02-02 VITALS — BP 116/75 | HR 73 | Temp 96.8°F | Resp 18 | Wt 140.3 lb

## 2022-02-02 DIAGNOSIS — J189 Pneumonia, unspecified organism: Secondary | ICD-10-CM | POA: Diagnosis not present

## 2022-02-02 DIAGNOSIS — I700 Atherosclerosis of aorta: Secondary | ICD-10-CM | POA: Insufficient documentation

## 2022-02-02 DIAGNOSIS — J440 Chronic obstructive pulmonary disease with acute lower respiratory infection: Secondary | ICD-10-CM | POA: Insufficient documentation

## 2022-02-02 DIAGNOSIS — C3411 Malignant neoplasm of upper lobe, right bronchus or lung: Secondary | ICD-10-CM | POA: Diagnosis not present

## 2022-02-02 DIAGNOSIS — Z87891 Personal history of nicotine dependence: Secondary | ICD-10-CM | POA: Insufficient documentation

## 2022-02-02 DIAGNOSIS — D539 Nutritional anemia, unspecified: Secondary | ICD-10-CM | POA: Diagnosis not present

## 2022-02-02 LAB — CBC WITH DIFFERENTIAL/PLATELET
Abs Immature Granulocytes: 0.03 10*3/uL (ref 0.00–0.07)
Basophils Absolute: 0.1 10*3/uL (ref 0.0–0.1)
Basophils Relative: 1 %
Eosinophils Absolute: 0.1 10*3/uL (ref 0.0–0.5)
Eosinophils Relative: 1 %
HCT: 37.6 % — ABNORMAL LOW (ref 39.0–52.0)
Hemoglobin: 11.9 g/dL — ABNORMAL LOW (ref 13.0–17.0)
Immature Granulocytes: 0 %
Lymphocytes Relative: 20 %
Lymphs Abs: 1.4 10*3/uL (ref 0.7–4.0)
MCH: 34 pg (ref 26.0–34.0)
MCHC: 31.6 g/dL (ref 30.0–36.0)
MCV: 107.4 fL — ABNORMAL HIGH (ref 80.0–100.0)
Monocytes Absolute: 0.5 10*3/uL (ref 0.1–1.0)
Monocytes Relative: 8 %
Neutro Abs: 5.1 10*3/uL (ref 1.7–7.7)
Neutrophils Relative %: 70 %
Platelets: 167 10*3/uL (ref 150–400)
RBC: 3.5 MIL/uL — ABNORMAL LOW (ref 4.22–5.81)
RDW: 13.9 % (ref 11.5–15.5)
WBC: 7.2 10*3/uL (ref 4.0–10.5)
nRBC: 0 % (ref 0.0–0.2)

## 2022-02-02 LAB — COMPREHENSIVE METABOLIC PANEL
ALT: 11 U/L (ref 0–44)
AST: 21 U/L (ref 15–41)
Albumin: 3.3 g/dL — ABNORMAL LOW (ref 3.5–5.0)
Alkaline Phosphatase: 62 U/L (ref 38–126)
Anion gap: 9 (ref 5–15)
BUN: 30 mg/dL — ABNORMAL HIGH (ref 8–23)
CO2: 25 mmol/L (ref 22–32)
Calcium: 8.9 mg/dL (ref 8.9–10.3)
Chloride: 106 mmol/L (ref 98–111)
Creatinine, Ser: 1.22 mg/dL (ref 0.61–1.24)
GFR, Estimated: >60 mL/min (ref >60)
Glucose, Bld: 140 mg/dL — ABNORMAL HIGH (ref 70–99)
Potassium: 4.6 mmol/L (ref 3.5–5.1)
Sodium: 140 mmol/L (ref 135–145)
Total Bilirubin: 0.3 mg/dL (ref 0.3–1.2)
Total Protein: 7.2 g/dL (ref 6.5–8.1)

## 2022-02-02 MED ORDER — DOXYCYCLINE HYCLATE 100 MG PO TABS: 100.0000 mg | ORAL_TABLET | Freq: Two times a day (BID) | ORAL | 0 refills | Status: DC

## 2022-02-02 MED ORDER — AMOXICILLIN 500 MG PO CAPS: 500.0000 mg | ORAL_CAPSULE | Freq: Three times a day (TID) | ORAL | 0 refills | Status: DC

## 2022-02-02 NOTE — Progress Notes (Signed)
[name] NOTE  Patient Care Team: Birdie Sons, MD as PCP - General (Family Medicine) Chauncey Mann, MD as Referring Physician (Psychiatry) Cammie Sickle, MD as Consulting Physician (Internal Medicine) [name], MD (Inactive) as Referring Physician (Cardiothoracic Surgery) Jules Husbands, MD as Consulting Physician (General Surgery)  CHIEF COMPLAINTS/PURPOSE OF CONSULTATION:  Lung cancer  #  Oncology History Overview Note  # DEC 2021- RUL LUNG CANCER- SQUAMOUS CELL CA [pTIa;-3.5 MM squamous cell; s/p wedge resection Dr.Oaks; incidental]- negative margins  # lung RUL wedge resection- [s/p tube]  # > 2010- left lung resection [? Etiology/benign]  DIAGNOSIS: RUL LUNG CA  STAGE:   I      ;  GOALS: cure  CURRENT/MOST RECENT THERAPY : surveillaince       Cancer of upper lobe of right lung ([place])  01/16/2019 Initial Diagnosis   Cancer of upper lobe of right lung (HCC)    HISTORY OF PRESENTING ILLNESS: Ambulating independently.  Accompanied by his sister.  [name] 77 y.o.  male prior history of smoking; and history of stage I incidental lung cancer is here for follow-up/review results of the CT scan.  Patient started coughing last night with increasing SOBr.  No fever no chills. Chronic mild shortness of breath on exertion.  Denies any headaches.  Denies any chest pain.  Denies any headaches.  Review of Systems  Constitutional:  Negative for chills, diaphoresis, fever and malaise/fatigue.  HENT:  Negative for nosebleeds and sore throat.   Eyes:  Negative for double vision.  Respiratory:  Positive for cough and shortness of breath. Negative for hemoptysis and wheezing.   Cardiovascular:  Negative for chest pain, palpitations, orthopnea and leg swelling.  Gastrointestinal:  Negative for abdominal pain, blood in stool, constipation, diarrhea, heartburn, melena, nausea and vomiting.  Genitourinary:  Negative for dysuria,  frequency and urgency.  Musculoskeletal:  Negative for back pain and joint pain.  Skin: Negative.  Negative for itching and rash.  Neurological:  Negative for dizziness, tingling, focal weakness, weakness and headaches.  Endo/Heme/Allergies:  Does not bruise/bleed easily.  Psychiatric/Behavioral:  Negative for depression. The patient is not nervous/anxious and does not have insomnia.      MEDICAL HISTORY:  Past Medical History:  Diagnosis Date   Arthritis    in his fingers   Bipolar disorder (HCC)    BPH (benign prostatic hyperplasia)    Cancer ([place]) 2020   LUNG CA   Depression    Emphysema    Episodic confusion    Possible bipolar disorder   GERD (gastroesophageal reflux disease)    History of chicken pox    History of hiatal hernia    HNP (herniated nucleus pulposus), lumbar    Hyperlipidemia    Memory deficit 07/31/2012   OCD (obsessive compulsive disorder)    Peripheral neuropathy    Restless leg syndrome 01/10/2003   Unspecified psychosis 03/01/2012   Wears dentures    full upper and lower   Wears hearing aid in both ears     SURGICAL HISTORY: Past Surgical History:  Procedure Laterality Date   BACK SURGERY     COLONOSCOPY     ESOPHAGOGASTRODUODENOSCOPY     ESOPHAGOGASTRODUODENOSCOPY (EGD) WITH PROPOFOL [name] 02/28/2019   Procedure: ESOPHAGOGASTRODUODENOSCOPY (EGD) WITH DILATION;  Surgeon: Lucilla Lame, MD;  Location: [facility];  Service: Endoscopy;  Laterality: [name];  Priority 3   FLEXIBLE BRONCHOSCOPY [name] 01/06/2019   Procedure: FLEXIBLE BRONCHOSCOPY;  Surgeon: [name],  MD;  Location: [facility];  Service: Thoracic;  Laterality: [name];   liver excision     for non-cancerous mass   LUMBAR LAMINECTOMY/DECOMPRESSION MICRODISCECTOMY Left 06/22/2017   Procedure: Extraforaminal Microdiscectomy  - L4-L5 - left;  Surgeon: Eustace Moore, MD;  Location: [facility];  Service: Neurosurgery;  Laterality: Left;   LUNG SURGERY  2005   Lipoma Removal   MAXIMUM ACCESS (MAS)  TRANSFORAMINAL LUMBAR INTERBODY FUSION (TLIF) 1 LEVEL [name] 02/18/2018   Procedure: Transforaminal Lumbar Interbody Fusion - Lumbar Four - Lumbar Five;  Surgeon: Eustace Moore, MD;  Location: [facility];  Service: Neurosurgery;  Laterality: [name];  Transforaminal Lumbar Interbody Fusion - Lumbar Four - Lumbar Five   THORACOTOMY Right 01/06/2019   Procedure: THORACOTOMY MAJOR;  Surgeon: [name], MD;  Location: [facility];  Service: Thoracic;  Laterality: Right;   VIDEO ASSISTED THORACOSCOPY (VATS)/WEDGE RESECTION Right 01/06/2019   Procedure: VIDEO ASSISTED THORACOSCOPY (VATS)/WEDGE RESECTION-RUL and RLL-lung resection;  Surgeon: [name], MD;  Location: [facility];  Service: Thoracic;  Laterality: Right;   XI ROBOTIC ASSISTED PARAESOPHAGEAL HERNIA REPAIR [name] 05/06/2019   Procedure: XI ROBOTIC ASSISTED PARAESOPHAGEAL HERNIA REPAIR;  Surgeon: Jules Husbands, MD;  Location: [facility];  Service: General;  Laterality: [name];    SOCIAL HISTORY: Social History   Socioeconomic History   Marital status: Divorced    Spouse name: Not on file   Number of children: 0   Years of education: 11   Highest education level: Not on file  Occupational History   Occupation: Retired  Tobacco Use   Smoking status: Former    Packs/day: 0.75    Years: 50.00    Total pack years: 37.50    Types: Cigarettes    Quit date: 11/26/2018    Years since quitting: 3.1   Smokeless tobacco: Never  Vaping Use   Vaping Use: Never used  Substance and Sexual Activity   Alcohol use: No   Drug use: No   Sexual activity: Not on file  Other Topics Concern   Not on file  Social History Narrative   Patient lives at home alone.   Patient is retired.    Patient has no children.    Patient has 11th grade education.    Social Determinants of Health   Financial Resource Strain: Not on file  Food Insecurity: Not on file  Transportation Needs: Not on file  Physical Activity: Not on file  Stress: Not on file  Social  Connections: Not on file  Intimate Partner Violence: Not on file    FAMILY HISTORY: Family History  Problem Relation Age of Onset   Heart disease Mother    Cancer Father        lung   Heart disease Father    Diabetes Sister    Heart disease Sister    Seizures Paternal Grandfather     ALLERGIES:  is allergic to ropinirole hcl and tramadol.  MEDICATIONS:  Current Outpatient Medications  Medication Sig Dispense Refill   amoxicillin (AMOXIL) 500 MG capsule Take 1 capsule (500 mg total) by mouth 3 (three) times daily. 30 capsule 0   cholecalciferol (VITAMIN D3) 25 MCG (1000 UT) tablet Take 1,000 Units by mouth daily.     Coenzyme Q10 (COQ10) 100 MG CAPS Take 100 mg by mouth daily.      divalproex (DEPAKOTE ER) 500 MG 24 hr tablet Take 1,000 mg by mouth at bedtime.      doxycycline (VIBRA-TABS) 100 MG tablet Take 1  tablet (100 mg total) by mouth 2 (two) times daily. 20 tablet 0   omeprazole (PRILOSEC) 40 MG capsule TAKE 1 CAPSULE BY MOUTH EVERY DAY 90 capsule 4   simvastatin (ZOCOR) 40 MG tablet TAKE 1 TABLET BY MOUTH EVERY DAY AT 6PM 90 tablet 4   vitamin B-12 (CYANOCOBALAMIN) 1000 MCG tablet Take 1,000 mcg by mouth daily.     ziprasidone (GEODON) 40 MG capsule Take 40 mg by mouth daily with supper.      aspirin 81 MG tablet Take 1 tablet (81 mg total) by mouth daily. (Patient not taking: Reported on 02/02/2022)     No current facility-administered medications for this visit.      [name]  PHYSICAL EXAMINATION:  Vitals:   02/02/22 1000  BP: 116/75  Pulse: 73  Resp: 18  Temp: (!) 96.8 F (36 C)  SpO2: 100%   Filed Weights   02/02/22 1000  Weight: 140 lb 4.8 oz (63.6 kg)    Physical Exam Constitutional:      Comments: [name] elderly male patient.  Accompanied by his sister.  Is walking himself.  HENT:     Head: Normocephalic and atraumatic.     Mouth/Throat:     Pharynx: No oropharyngeal exudate.  Eyes:     Pupils: Pupils are equal, round, and reactive to  light.  Cardiovascular:     Rate and Rhythm: Normal rate and regular rhythm.  Pulmonary:     Effort: No respiratory distress.     Breath sounds: No wheezing.  Abdominal:     General: Bowel sounds are normal. There is no distension.     Palpations: Abdomen is soft. There is no mass.     Tenderness: There is no abdominal tenderness. There is no guarding or rebound.  Musculoskeletal:        General: No tenderness. Normal range of motion.     Cervical back: Normal range of motion and neck supple.  Skin:    General: Skin is warm.  Neurological:     Mental Status: He is alert and oriented to person, place, and time.  Psychiatric:        Mood and Affect: Affect normal.      LABORATORY DATA:  I have reviewed the data as listed Lab Results  Component Value Date   WBC 7.2 02/02/2022   HGB 11.9 (L) 02/02/2022   HCT 37.6 (L) 02/02/2022   MCV 107.4 (H) 02/02/2022   PLT 167 02/02/2022   Recent Labs    05/10/21 1321 02/02/22 0958  NA 146* 140  K 4.1 4.6  CL 105 106  CO2 22 25  GLUCOSE 93 140*  BUN 24 30*  CREATININE 1.19 1.22  CALCIUM 9.8 8.9  GFRNONAA  --  >60  PROT 7.4 7.2  ALBUMIN 4.5 3.3*  AST 35 21  ALT 15 11  ALKPHOS 80 62  BILITOT 0.4 0.3    RADIOGRAPHIC STUDIES: I have personally reviewed the radiological images as listed and agreed with the findings in the report. CT CHEST WO CONTRAST  Result Date: 01/30/2022 CLINICAL DATA:  Non-small cell lung cancer, monitor. History of right upper lobe lung cancer with surgical resection in 2021. * Tracking Code: BO * EXAM: CT CHEST WITHOUT CONTRAST TECHNIQUE: Multidetector CT imaging of the chest was performed following the standard protocol without IV contrast. RADIATION DOSE REDUCTION: This exam was performed according to the departmental dose-optimization program which includes automated exposure control, adjustment of the mA and/or kV according to patient size  and/or use of iterative reconstruction technique. COMPARISON:   Chest CT 01/31/2021 and 01/30/2020. FINDINGS: Cardiovascular: Again demonstrated is atherosclerosis of the aorta, great vessels and coronary arteries. No acute vascular findings on noncontrast imaging. The heart size is normal. There is no pericardial effusion. Mediastinum/Nodes: There are no enlarged mediastinal, hilar or axillary lymph nodes.Small subcarinal lymph nodes are unchanged. Hilar assessment is limited by the lack of intravenous contrast, although the hilar contours appear unchanged. Chronic mild distal esophageal wall thickening appears stable. The thyroid gland and trachea appear unremarkable. Lungs/Pleura: No pleural effusion or pneumothorax. Mild centrilobular and paraseptal emphysema are again noted bilaterally. The right lung appears stable, with postsurgical changes from wedge resection posteriorly in the upper and lower lobes. Subpleural right middle lobe nodule measuring 7 mm on image 111/3 is unchanged. Chronic nodularity around the surgical clips in the left upper lobe has slightly improved, measuring 1.1 x 0.6 cm on image 48/3 (previously 1.4 x 0.9 cm. However, there is new nodularity anteriorly within the lingula adjacent to surgical clips or calcifications, measuring 1.0 x 1.0 cm on image 60/3. In addition, there is new irregular subpleural opacity anteriorly in the left lower lobe with internal air bronchograms measuring up to 3.6 x 2.9 cm on image 118/3. This finding is suspicious for pneumonia. No other focal nodules. Upper abdomen: No significant findings are seen in the visualized upper abdomen. Musculoskeletal/Chest wall: There is no chest wall mass or suspicious osseous finding. Old right-sided rib fractures are noted. Unchanged multilevel thoracolumbar spondylosis. IMPRESSION: 1. New irregular subpleural opacity anteriorly in the left lower lobe with internal air bronchograms, suspicious for pneumonia. Recommend chest CT follow-up in 3 months to confirm resolution. 2. New nodularity  anteriorly in the lingula is nonspecific and could be inflammatory or neoplastic. This too can be addressed on short-term follow-up CT. 3. Chronic nodularity around the surgical clips in the left upper lobe has slightly improved. Stable postsurgical changes in the right lung. 4. No evidence of metastatic disease. 5. Aortic Atherosclerosis (ICD10-I70.0) and Emphysema (ICD10-J43.9). Electronically Signed   By: Richardean Sale M.D.   On: 01/30/2022 12:38    ASSESSMENT & PLAN:   Cancer of upper lobe of right lung (HCC) #Squamous cell cancer right upper lobe stage Ia [incidental; T= 3.5 mm].    JAN 2024- -CT scan-no evidence of recurrence; stable right middle lobe nodule; left upper lobe wedge resection site stable/scarring; but focus of pneumonia.  #  Pneumonia in Left lower lung- airbrochograms- start amoxicillin plus doxycycline.  Repeat CT scan in 4 months  # Mild macrocytic anemia-no alcohol.  #S/p recent fall [evaluation with Dr. Georga Kaufmann MRI of the brain February 3rd.  Incidental rib fractures right side-noted on CT scan reviewed with patient and sister.  #Smoker-quit smoking-counseled quit smoking.  # rise in creatinine- 1.27; GFR- 59- recommend increase fluid inrale  #COPD-stable.  Continue follow-up with pulmonary.  #Incidental findings on  CT Imaging dated: JAN, 2023: Atherosclerosis; no fractures; reflux/Nissen fundoplication I reviewed/discussed/counseled the patient.   # DISPOSITION: [id number] Fay-sister.  # follow up in 4 months- MD; labs- cbc/cmp;iron studies; B12; folate; retic count; LDH; haptoglobin- CT chest prior-Dr.B  # I reviewed the blood work- with the patient in detail; also reviewed the imaging independently [as summarized above]; and with the patient in detail.      All questions were answered. The patient knows to call the clinic with any problems, questions or concerns.    Cammie Sickle, MD 02/02/2022 12:36 PM

## 2022-02-02 NOTE — Assessment & Plan Note (Addendum)
#  Squamous cell cancer right upper lobe stage Ia [incidental; T= 3.5 mm].  JAN 2024- -CT scan-no evidence of recurrence; stable right middle lobe nodule; left upper lobe wedge resection site stable/scarring; but focus of pneumonia.  #  JAN 2024- Pneumonia in Left lower lung- airbrochograms- start amoxicillin plus doxycycline.  Repeat CT scan in 4 months  # Mild macrocytic anemia-hb 12- no alcohol; unclear etiology.  No history of liver disease; CT contrast [ordered in 4 months].  Check Q00 folic acid; LDH haptoglobin.  Reticulocyte count.  #Smoker-quit smoking-counseled quit smoking.  #COPD-stable.  Continue follow-up with pulmonary.  #Incidental findings on  CT Imaging dated: JAN, 2024: Atherosclerosis; reflux/Nissen fundoplication I reviewed/discussed/counseled the patient.   # DISPOSITION: [id number] Fay-sister.  # follow up in 4 months- MD; labs- cbc/cmp;iron studies; B12; folate; retic count; LDH; haptoglobin- CT chest prior-Dr.B  # I reviewed the blood work- with the patient in detail; also reviewed the imaging independently [as summarized above]; and with the patient in detail.

## 2022-02-02 NOTE — Progress Notes (Signed)
Patient started coughing last night with increasing SOBr.

## 2022-02-15 DIAGNOSIS — H353231 Exudative age-related macular degeneration, bilateral, with active choroidal neovascularization: Secondary | ICD-10-CM | POA: Diagnosis not present

## 2022-03-16 ENCOUNTER — Telehealth: Payer: Self-pay | Admitting: Family Medicine

## 2022-03-16 NOTE — Telephone Encounter (Signed)
Called patient to schedule Medicare Annual Wellness Visit (AWV). Left message for patient to call back and schedule Medicare Annual Wellness Visit (AWV).  Last date of AWV: 02/12/2021  Please schedule an appointment at any time with [facility].  If any questions, please contact me at [phone].  Thank you ,  [place] Direct Dial: [phone]

## 2022-03-20 ENCOUNTER — Telehealth: Payer: Self-pay | Admitting: Family Medicine

## 2022-03-21 DIAGNOSIS — H353231 Exudative age-related macular degeneration, bilateral, with active choroidal neovascularization: Secondary | ICD-10-CM | POA: Diagnosis not present

## 2022-03-28 ENCOUNTER — Telehealth: Payer: Self-pay | Admitting: Family Medicine

## 2022-03-28 NOTE — Telephone Encounter (Signed)
Contacted [name] to schedule their annual wellness visit. Appointment made for 05/01/2022.  [facility] Direct Dial: [phone]

## 2022-04-19 DIAGNOSIS — F3172 Bipolar disorder, in full remission, most recent episode hypomanic: Secondary | ICD-10-CM | POA: Diagnosis not present

## 2022-04-20 DIAGNOSIS — H353231 Exudative age-related macular degeneration, bilateral, with active choroidal neovascularization: Secondary | ICD-10-CM | POA: Diagnosis not present

## 2022-04-27 ENCOUNTER — Other Ambulatory Visit: Payer: Self-pay | Admitting: Family Medicine

## 2022-05-19 DIAGNOSIS — H353231 Exudative age-related macular degeneration, bilateral, with active choroidal neovascularization: Secondary | ICD-10-CM | POA: Diagnosis not present

## 2022-06-02 ENCOUNTER — Ambulatory Visit
Admission: RE | Admit: 2022-06-02 | Discharge: 2022-06-02 | Disposition: A | Discharge status: Home / Self Care | Payer: PPO | Source: Ambulatory Visit | Attending: Internal Medicine | Admitting: Internal Medicine

## 2022-06-02 DIAGNOSIS — C3411 Malignant neoplasm of upper lobe, right bronchus or lung: Secondary | ICD-10-CM | POA: Insufficient documentation

## 2022-06-02 DIAGNOSIS — J479 Bronchiectasis, uncomplicated: Secondary | ICD-10-CM | POA: Diagnosis not present

## 2022-06-02 DIAGNOSIS — C3490 Malignant neoplasm of unspecified part of unspecified bronchus or lung: Secondary | ICD-10-CM | POA: Diagnosis not present

## 2022-06-02 DIAGNOSIS — J439 Emphysema, unspecified: Secondary | ICD-10-CM | POA: Diagnosis not present

## 2022-06-02 MED ORDER — IOHEXOL 300 MG/ML  SOLN
75.0000 mL | Freq: Once | INTRAMUSCULAR | Status: AC | PRN
  Administered 2022-06-02: 75 mL via INTRAVENOUS

## 2022-06-06 ENCOUNTER — Other Ambulatory Visit: Payer: Self-pay | Admitting: *Deleted

## 2022-06-06 DIAGNOSIS — C3411 Malignant neoplasm of upper lobe, right bronchus or lung: Secondary | ICD-10-CM

## 2022-06-07 ENCOUNTER — Encounter: Payer: Self-pay | Admitting: Internal Medicine

## 2022-06-07 ENCOUNTER — Inpatient Hospital Stay: Payer: PPO | Attending: Internal Medicine

## 2022-06-07 ENCOUNTER — Telehealth: Payer: Self-pay | Admitting: *Deleted

## 2022-06-07 ENCOUNTER — Inpatient Hospital Stay (HOSPITAL_BASED_OUTPATIENT_CLINIC_OR_DEPARTMENT_OTHER): Payer: PPO | Admitting: Internal Medicine

## 2022-06-07 DIAGNOSIS — R9389 Abnormal findings on diagnostic imaging of other specified body structures: Secondary | ICD-10-CM | POA: Diagnosis not present

## 2022-06-07 DIAGNOSIS — J440 Chronic obstructive pulmonary disease with acute lower respiratory infection: Secondary | ICD-10-CM | POA: Diagnosis not present

## 2022-06-07 DIAGNOSIS — C3411 Malignant neoplasm of upper lobe, right bronchus or lung: Secondary | ICD-10-CM | POA: Insufficient documentation

## 2022-06-07 DIAGNOSIS — F17210 Nicotine dependence, cigarettes, uncomplicated: Secondary | ICD-10-CM | POA: Insufficient documentation

## 2022-06-07 DIAGNOSIS — D539 Nutritional anemia, unspecified: Secondary | ICD-10-CM | POA: Insufficient documentation

## 2022-06-07 LAB — CBC WITH DIFFERENTIAL/PLATELET
Abs Immature Granulocytes: 0.02 10*3/uL (ref 0.00–0.07)
Basophils Absolute: 0 10*3/uL (ref 0.0–0.1)
Basophils Relative: 1 %
Eosinophils Absolute: 0.1 10*3/uL (ref 0.0–0.5)
Eosinophils Relative: 1 %
HCT: 41.8 % (ref 39.0–52.0)
Hemoglobin: 13.9 g/dL (ref 13.0–17.0)
Immature Granulocytes: 0 %
Lymphocytes Relative: 22 %
Lymphs Abs: 1.3 10*3/uL (ref 0.7–4.0)
MCH: 33.1 pg (ref 26.0–34.0)
MCHC: 33.3 g/dL (ref 30.0–36.0)
MCV: 99.5 fL (ref 80.0–100.0)
Monocytes Absolute: 0.4 10*3/uL (ref 0.1–1.0)
Monocytes Relative: 6 %
Neutro Abs: 4.2 10*3/uL (ref 1.7–7.7)
Neutrophils Relative %: 70 %
Platelets: 169 10*3/uL (ref 150–400)
RBC: 4.2 MIL/uL — ABNORMAL LOW (ref 4.22–5.81)
RDW: 13.8 % (ref 11.5–15.5)
WBC: 6 10*3/uL (ref 4.0–10.5)
nRBC: 0 % (ref 0.0–0.2)

## 2022-06-07 LAB — COMPREHENSIVE METABOLIC PANEL
ALT: 13 U/L (ref 0–44)
AST: 20 U/L (ref 15–41)
Albumin: 3.9 g/dL (ref 3.5–5.0)
Alkaline Phosphatase: 69 U/L (ref 38–126)
Anion gap: 11 (ref 5–15)
BUN: 23 mg/dL (ref 8–23)
CO2: 25 mmol/L (ref 22–32)
Calcium: 9.2 mg/dL (ref 8.9–10.3)
Chloride: 103 mmol/L (ref 98–111)
Creatinine, Ser: 1.24 mg/dL (ref 0.61–1.24)
GFR, Estimated: >60 mL/min (ref >60)
Glucose, Bld: 147 mg/dL — ABNORMAL HIGH (ref 70–99)
Potassium: 4.3 mmol/L (ref 3.5–5.1)
Sodium: 139 mmol/L (ref 135–145)
Total Bilirubin: 0.5 mg/dL (ref 0.3–1.2)
Total Protein: 7.7 g/dL (ref 6.5–8.1)

## 2022-06-07 LAB — IRON AND TIBC
Iron: 92 ug/dL (ref 45–182)
Saturation Ratios: 23 % (ref 17.9–39.5)
TIBC: 393 ug/dL (ref 250–450)
UIBC: 301 ug/dL

## 2022-06-07 LAB — LACTATE DEHYDROGENASE: LDH: 153 U/L (ref 98–192)

## 2022-06-07 LAB — RETIC PANEL
Immature Retic Fract: 5.5 % (ref 2.3–15.9)
RBC.: 4.18 MIL/uL — ABNORMAL LOW (ref 4.22–5.81)
Retic Count, Absolute: 69 10*3/uL (ref 19.0–186.0)
Retic Ct Pct: 1.7 % (ref 0.4–3.1)
Reticulocyte Hemoglobin: 35.8 pg (ref >27.9)

## 2022-06-07 LAB — FOLATE: Folate: 19.2 ng/mL (ref >5.9)

## 2022-06-07 LAB — VITAMIN B12: Vitamin B-12: 3827 pg/mL — ABNORMAL HIGH (ref 180–914)

## 2022-06-07 NOTE — Progress Notes (Signed)
CT results.(Called for reading)

## 2022-06-07 NOTE — Assessment & Plan Note (Addendum)
#  Squamous cell cancer right upper lobe stage Ia [incidental; T= 3.5 mm JAN 2021].  MAY 2024- -CT scan-no evidence of recurrence; stable right middle lobe nodule; left upper lobe wedge resection site stable/scarring; see below re: LLL pneumonia.  Will continue surveillance imaging in 1 year.  # JAN 2024- Pneumonia in Left lower lung- s/p  amoxicillin plus doxycycline. MAY 2024- CT scan- improved resolved.    # Mild macrocytic anemia- [no alcohol;  No history of liver disease;] MAY 2024- today- hb 13.2-; chemistries- stable-improved.  Monitor for now.  #Smoker--again counseled quit smoking.  Patient is not interested.  #COPD-stable.  Continue follow-up with pulmonary.  #Incidental findings on  CT Imaging dated: MAY 2024: Atherosclerosis; reflux/Nissen fundoplication I reviewed/discussed/counseled the patient.    [phone]/ Fay-sister.   # DISPOSITION:  # follow up in 12 months- MD; labs- cbc/cmp; 2 weeks prior- CT chest prior-Dr.B  # I reviewed the blood work- with the patient in detail; also reviewed the imaging independently [as summarized above]; and with the patient in detail.

## 2022-06-07 NOTE — Progress Notes (Signed)
[name] CONSULT NOTE  Patient Care Team: Malva Limes, MD as PCP - General (Family Medicine) Darliss Ridgel, MD as Referring Physician (Psychiatry) Earna Coder, MD as Consulting Physician (Internal Medicine) Hulda Marin, MD (Inactive) as Referring Physician (Cardiothoracic Surgery) Leafy Ro, MD as Consulting Physician (General Surgery)  CHIEF COMPLAINTS/PURPOSE OF CONSULTATION:  Lung cancer  #  Oncology History Overview Note  # DEC 2021- RUL LUNG CANCER- SQUAMOUS CELL CA [pTIa;-3.5 MM squamous cell; s/p wedge resection Dr.Oaks; incidental]- negative margins  # lung RUL wedge resection- [s/p tube]  # > 2010- left lung resection [? Etiology/benign]  DIAGNOSIS: RUL LUNG CA  STAGE:   I      ;  GOALS: cure  CURRENT/MOST RECENT THERAPY : surveillaince       Cancer of upper lobe of right lung (HCC)  01/16/2019 Initial Diagnosis   Cancer of upper lobe of right lung (HCC)    HISTORY OF PRESENTING ILLNESS: Ambulating independently.  Accompanied by his sister.  [name] 77 y.o.  male prior history of smoking; and history of stage I incidental lung cancer is here for follow-up/review results of the CT scan.  Approximately 4 months ago patient was diagnosed with pneumonia.  S/p antibiotics steroids.  Currently symptoms have improved.  Continues to have chronic cough mildly dry.  Chronic mild shortness of breath on exertion.  Unfortunately patient continues to smoke.  Denies any headaches.  Denies any chest pain.  Denies any headaches.  Review of Systems  Constitutional:  Negative for chills, diaphoresis, fever and malaise/fatigue.  HENT:  Negative for nosebleeds and sore throat.   Eyes:  Negative for double vision.  Respiratory:  Positive for cough and shortness of breath. Negative for hemoptysis and wheezing.   Cardiovascular:  Negative for chest pain, palpitations, orthopnea and leg swelling.  Gastrointestinal:  Negative for  abdominal pain, blood in stool, constipation, diarrhea, heartburn, melena, nausea and vomiting.  Genitourinary:  Negative for dysuria, frequency and urgency.  Musculoskeletal:  Negative for back pain and joint pain.  Skin: Negative.  Negative for itching and rash.  Neurological:  Negative for dizziness, tingling, focal weakness, weakness and headaches.  Endo/Heme/Allergies:  Does not bruise/bleed easily.  Psychiatric/Behavioral:  Negative for depression. The patient is not nervous/anxious and does not have insomnia.      MEDICAL HISTORY:  Past Medical History:  Diagnosis Date   Arthritis    in his fingers   Bipolar disorder (HCC)    BPH (benign prostatic hyperplasia)    Cancer (HCC) 2020   LUNG CA   Depression    Emphysema    Episodic confusion    Possible bipolar disorder   GERD (gastroesophageal reflux disease)    History of chicken pox    History of hiatal hernia    HNP (herniated nucleus pulposus), lumbar    Hyperlipidemia    Memory deficit 07/31/2012   OCD (obsessive compulsive disorder)    Peripheral neuropathy    Restless leg syndrome 01/10/2003   Unspecified psychosis 03/01/2012   Wears dentures    full upper and lower   Wears hearing aid in both ears     SURGICAL HISTORY: Past Surgical History:  Procedure Laterality Date   BACK SURGERY     COLONOSCOPY     ESOPHAGOGASTRODUODENOSCOPY     ESOPHAGOGASTRODUODENOSCOPY (EGD) WITH PROPOFOL [name] 02/28/2019   Procedure: ESOPHAGOGASTRODUODENOSCOPY (EGD) WITH DILATION;  Surgeon: Midge Minium, MD;  Location: [facility];  Service: Endoscopy;  Laterality: [name];  Priority 3   FLEXIBLE BRONCHOSCOPY [name] 01/06/2019   Procedure: FLEXIBLE BRONCHOSCOPY;  Surgeon: Hulda Marin, MD;  Location: [facility];  Service: Thoracic;  Laterality: [name];   liver excision     for non-cancerous mass   LUMBAR LAMINECTOMY/DECOMPRESSION MICRODISCECTOMY Left 06/22/2017   Procedure: Extraforaminal Microdiscectomy  - L4-L5 - left;  Surgeon: Tia Alert, MD;  Location: [facility] OR;  Service: Neurosurgery;  Laterality: Left;   LUNG SURGERY  2005   Lipoma Removal   MAXIMUM ACCESS (MAS) TRANSFORAMINAL LUMBAR INTERBODY FUSION (TLIF) 1 LEVEL [name] 02/18/2018   Procedure: Transforaminal Lumbar Interbody Fusion - Lumbar Four - Lumbar Five;  Surgeon: Tia Alert, MD;  Location: [facility] OR;  Service: Neurosurgery;  Laterality: [name];  Transforaminal Lumbar Interbody Fusion - Lumbar Four - Lumbar Five   THORACOTOMY Right 01/06/2019   Procedure: THORACOTOMY MAJOR;  Surgeon: Hulda Marin, MD;  Location: [facility];  Service: Thoracic;  Laterality: Right;   VIDEO ASSISTED THORACOSCOPY (VATS)/WEDGE RESECTION Right 01/06/2019   Procedure: VIDEO ASSISTED THORACOSCOPY (VATS)/WEDGE RESECTION-RUL and RLL-lung resection;  Surgeon: Hulda Marin, MD;  Location: [facility];  Service: Thoracic;  Laterality: Right;   XI ROBOTIC ASSISTED PARAESOPHAGEAL HERNIA REPAIR [name] 05/06/2019   Procedure: XI ROBOTIC ASSISTED PARAESOPHAGEAL HERNIA REPAIR;  Surgeon: Leafy Ro, MD;  Location: [facility];  Service: General;  Laterality: [name];    SOCIAL HISTORY: Social History   Socioeconomic History   Marital status: Divorced    Spouse name: Not on file   Number of children: 0   Years of education: 11   Highest education level: Not on file  Occupational History   Occupation: Retired  Tobacco Use   Smoking status: Former    Packs/day: 0.75    Years: 50.00    Additional pack years: 0.00    Total pack years: 37.50    Types: Cigarettes    Quit date: 11/26/2018    Years since quitting: 3.5   Smokeless tobacco: Never  Vaping Use   Vaping Use: Never used  Substance and Sexual Activity   Alcohol use: No   Drug use: No   Sexual activity: Not on file  Other Topics Concern   Not on file  Social History Narrative   Patient lives at home alone.   Patient is retired.    Patient has no children.    Patient has 11th grade education.    Social Determinants of Health   Financial  Resource Strain: Not on file  Food Insecurity: Not on file  Transportation Needs: Not on file  Physical Activity: Not on file  Stress: Not on file  Social Connections: Not on file  Intimate Partner Violence: Not on file    FAMILY HISTORY: Family History  Problem Relation Age of Onset   Heart disease Mother    Cancer Father        lung   Heart disease Father    Diabetes Sister    Heart disease Sister    Seizures Paternal Grandfather     ALLERGIES:  is allergic to ropinirole hcl and tramadol.  MEDICATIONS:  Current Outpatient Medications  Medication Sig Dispense Refill   amoxicillin (AMOXIL) 500 MG capsule Take 1 capsule (500 mg total) by mouth 3 (three) times daily. 30 capsule 0   cholecalciferol (VITAMIN D3) 25 MCG (1000 UT) tablet Take 1,000 Units by mouth daily.     Coenzyme Q10 (COQ10) 100 MG CAPS Take 100 mg by mouth daily.  divalproex (DEPAKOTE ER) 500 MG 24 hr tablet Take 1,000 mg by mouth at bedtime.      doxycycline (VIBRA-TABS) 100 MG tablet Take 1 tablet (100 mg total) by mouth 2 (two) times daily. 20 tablet 0   omeprazole (PRILOSEC) 40 MG capsule TAKE 1 CAPSULE BY MOUTH EVERY DAY 90 capsule 4   simvastatin (ZOCOR) 40 MG tablet TAKE 1 TABLET BY MOUTH EVERY DAY AT 6PM 90 tablet 4   vitamin B-12 (CYANOCOBALAMIN) 1000 MCG tablet Take 1,000 mcg by mouth daily.     ziprasidone (GEODON) 40 MG capsule Take 40 mg by mouth daily with supper.      aspirin 81 MG tablet Take 1 tablet (81 mg total) by mouth daily. (Patient not taking: Reported on 02/02/2022)     No current facility-administered medications for this visit.      [name]  PHYSICAL EXAMINATION:  Vitals:   06/07/22 1022  BP: 118/73  Pulse: 76  Temp: 98 F (36.7 C)  SpO2: 100%   Filed Weights   06/07/22 1022  Weight: 138 lb 3.2 oz (62.7 kg)    Physical Exam Constitutional:      Comments: [name] elderly male patient.  Accompanied by his sister.  Is walking himself.  HENT:     Head:  Normocephalic and atraumatic.     Mouth/Throat:     Pharynx: No oropharyngeal exudate.  Eyes:     Pupils: Pupils are equal, round, and reactive to light.  Cardiovascular:     Rate and Rhythm: Normal rate and regular rhythm.  Pulmonary:     Effort: No respiratory distress.     Breath sounds: No wheezing.  Abdominal:     General: Bowel sounds are normal. There is no distension.     Palpations: Abdomen is soft. There is no mass.     Tenderness: There is no abdominal tenderness. There is no guarding or rebound.  Musculoskeletal:        General: No tenderness. Normal range of motion.     Cervical back: Normal range of motion and neck supple.  Skin:    General: Skin is warm.  Neurological:     Mental Status: He is alert and oriented to person, place, and time.  Psychiatric:        Mood and Affect: Affect normal.      LABORATORY DATA:  I have reviewed the data as listed Lab Results  Component Value Date   WBC 6.0 06/07/2022   HGB 13.9 06/07/2022   HCT 41.8 06/07/2022   MCV 99.5 06/07/2022   PLT 169 06/07/2022   Recent Labs    02/02/22 0958 06/07/22 1013  NA 140 139  K 4.6 4.3  CL 106 103  CO2 25 25  GLUCOSE 140* 147*  BUN 30* 23  CREATININE 1.22 1.24  CALCIUM 8.9 9.2  GFRNONAA >60 >60  PROT 7.2 7.7  ALBUMIN 3.3* 3.9  AST 21 20  ALT 11 13  ALKPHOS 62 69  BILITOT 0.3 0.5    RADIOGRAPHIC STUDIES: I have personally reviewed the radiological images as listed and agreed with the findings in the report. CT CHEST W CONTRAST  Result Date: 06/07/2022 CLINICAL DATA:  Lung cancer restaging * Tracking Code: BO * EXAM: CT CHEST WITH CONTRAST TECHNIQUE: Multidetector CT imaging of the chest was performed during intravenous contrast administration. RADIATION DOSE REDUCTION: This exam was performed according to the departmental dose-optimization program which includes automated exposure control, adjustment of the mA and/or kV according to patient  size and/or use of iterative  reconstruction technique. CONTRAST:  75mL OMNIPAQUE IOHEXOL 300 MG/ML  SOLN COMPARISON:  01/30/2022 FINDINGS: Cardiovascular: Aortic atherosclerosis. Normal heart size. Three-vessel coronary artery calcifications. No pericardial effusion. Mediastinum/Nodes: No enlarged mediastinal, hilar, or axillary lymph nodes. Circumferential wall thickening of the esophagus. Thyroid gland and trachea demonstrate no significant findings. Lungs/Pleura: Mild centrilobular and paraseptal emphysema. Status post wedge resection of the posterior right upper and right lower lobes (series 3, image 42, 78). Unchanged post treatment/post radiation appearance of a nodule of the left upper lobe measuring 1.5 x 1.1 cm (series 3, image 49). Nearly complete interval resolution of a previously seen nodular opacity adjacent to calcifications or clips in the anterior lingula (series 3, image 58). Substantial interval resolution of a previously seen masslike opacity in the peripheral left lower lobe with underlying air bronchograms, this is now a predominantly cystic region of bronchiectasis measuring 5.0 x 4.0 cm (series 3, image 123). No pleural effusion or pneumothorax. Upper Abdomen: No acute abnormality.  Fundoplication. Musculoskeletal: No chest wall abnormality. No acute osseous findings. IMPRESSION: 1. Unchanged post treatment/post radiation appearance of a nodule of the left upper lobe measuring 1.5 x 1.1 cm. 2. Nearly complete interval resolution of a previously seen nodular opacity adjacent to calcifications or clips in the anterior lingula. 3. Substantial interval resolution of a previously seen masslike opacity in the peripheral left lower lobe with underlying air bronchograms, this is now a predominantly cystic region of bronchiectasis measuring 5.0 x 4.0 cm. Findings are consistent with resolution of infection now with underlying chronic sequelae. 4. Status post wedge resection of the posterior right upper and right lower lobes. No  evidence of local malignant recurrence in the right lung. 5. No evidence of lymphadenopathy or metastatic disease in the chest. 6. Emphysema. 7. Coronary artery disease. 8. Circumferential wall thickening of the esophagus with fundoplication, findings most consistent with reflux esophagitis. Aortic Atherosclerosis (ICD10-I70.0) and Emphysema (ICD10-J43.9). Electronically Signed   By: Jearld Lesch M.D.   On: 06/07/2022 10:52    ASSESSMENT & PLAN:   Cancer of upper lobe of right lung (HCC) #Squamous cell cancer right upper lobe stage Ia [incidental; T= 3.5 mm JAN 2021].  MAY 2024- -CT scan-no evidence of recurrence; stable right middle lobe nodule; left upper lobe wedge resection site stable/scarring; see below re: LLL pneumonia.  Will continue surveillance imaging in 1 year.  # JAN 2024- Pneumonia in Left lower lung- s/p  amoxicillin plus doxycycline. MAY 2024- CT scan- improved resolved.    # Mild macrocytic anemia- [no alcohol;  No history of liver disease;] MAY 2024- today- hb 13.2-; chemistries- stable-improved.  Monitor for now.  #Smoker--again counseled quit smoking.  Patient is not interested.  #COPD-stable.  Continue follow-up with pulmonary.  #Incidental findings on  CT Imaging dated: MAY 2024: Atherosclerosis; reflux/Nissen fundoplication I reviewed/discussed/counseled the patient.    [phone]/ Fay-sister.   # DISPOSITION:  # follow up in 12 months- MD; labs- cbc/cmp; 2 weeks prior- CT chest prior-Dr.B  # I reviewed the blood work- with the patient in detail; also reviewed the imaging independently [as summarized above]; and with the patient in detail.    All questions were answered. The patient knows to call the clinic with any problems, questions or concerns.    Earna Coder, MD 06/07/2022 11:44 AM

## 2022-06-07 NOTE — Telephone Encounter (Signed)
Informed patient/sister- [phone] Fay-sister. That the CT scan does not show any evidence of any recurrent cancer. It show the previous pneumonia from January has resolved. However we would recommend follow-up imaging in 1 year as discussed at the viist today. Sister appreciated the call.

## 2022-06-09 LAB — HAPTOGLOBIN: Haptoglobin: 158 mg/dL (ref 34–355)

## 2022-06-12 ENCOUNTER — Encounter: Payer: Self-pay | Admitting: Family Medicine

## 2022-06-14 ENCOUNTER — Ambulatory Visit (INDEPENDENT_AMBULATORY_CARE_PROVIDER_SITE_OTHER): Payer: PPO | Admitting: Family Medicine

## 2022-06-14 VITALS — BP 121/60 | HR 60 | Temp 97.7°F | Ht 70.0 in | Wt 138.0 lb

## 2022-06-14 DIAGNOSIS — I6990 Unspecified sequelae of unspecified cerebrovascular disease: Secondary | ICD-10-CM

## 2022-06-14 DIAGNOSIS — I700 Atherosclerosis of aorta: Secondary | ICD-10-CM

## 2022-06-14 DIAGNOSIS — Z125 Encounter for screening for malignant neoplasm of prostate: Secondary | ICD-10-CM

## 2022-06-14 DIAGNOSIS — R739 Hyperglycemia, unspecified: Secondary | ICD-10-CM | POA: Diagnosis not present

## 2022-06-14 DIAGNOSIS — Z0000 Encounter for general adult medical examination without abnormal findings: Secondary | ICD-10-CM

## 2022-06-14 DIAGNOSIS — I679 Cerebrovascular disease, unspecified: Secondary | ICD-10-CM | POA: Diagnosis not present

## 2022-06-14 DIAGNOSIS — J449 Chronic obstructive pulmonary disease, unspecified: Secondary | ICD-10-CM

## 2022-06-14 DIAGNOSIS — R29898 Other symptoms and signs involving the musculoskeletal system: Secondary | ICD-10-CM

## 2022-06-14 DIAGNOSIS — E785 Hyperlipidemia, unspecified: Secondary | ICD-10-CM

## 2022-06-14 DIAGNOSIS — E038 Other specified hypothyroidism: Secondary | ICD-10-CM

## 2022-06-14 DIAGNOSIS — F3012 Manic episode without psychotic symptoms, moderate: Secondary | ICD-10-CM

## 2022-06-14 DIAGNOSIS — I2510 Atherosclerotic heart disease of native coronary artery without angina pectoris: Secondary | ICD-10-CM | POA: Diagnosis not present

## 2022-06-14 MED ORDER — SHINGRIX 50 MCG/0.5ML IM SUSR
0.5000 mL | Freq: Once | INTRAMUSCULAR | 0 refills | Status: DC
Start: 2022-06-14 — End: 2022-06-14

## 2022-06-14 NOTE — Progress Notes (Signed)
Complete Physical Exam     Patient: [name], Male    DOB: 05/09/1945, 77 y.o.   MRN: [id number] Visit Date: 06/14/2022  Today's Provider: Mila Merry, MD   No chief complaint on file.  Subjective    [name] is a 77 y.o. male who presents today for his complete physical examination. He reports consuming a general diet. Patient uses a stationary bicycle.  He generally feels well. He reports sleeping well. He does have additional problems to discuss today.   He is here with his sister and they both report progressive pain and weakness in legs when he stands and weakness in his legs to where he is not able to stand or walk for very long over the last few years making it difficult to walk and stand for long periods of time.   He has been on simvastatin for many years due to cerebrovascular disease history of CVA.   Lab Results  Component Value Date   CHOL 156 01/19/2022   HDL 53 01/19/2022   LDLCALC 86 01/19/2022   TRIG 90 01/19/2022   CHOLHDL 2.0 05/10/2021    He recently had follow up visit and labs with Dr. Donneta Romberg Lab Results  Component Value Date   WBC 6.0 06/07/2022   HGB 13.9 06/07/2022   HCT 41.8 06/07/2022   MCV 99.5 06/07/2022   PLT 169 06/07/2022   Last metabolic panel Lab Results  Component Value Date   GLUCOSE 147 (H) 06/07/2022   NA 139 06/07/2022   K 4.3 06/07/2022   CL 103 06/07/2022   CO2 25 06/07/2022   BUN 23 06/07/2022   CREATININE 1.24 06/07/2022   GFRNONAA >60 06/07/2022   CALCIUM 9.2 06/07/2022   PHOS 3.5 05/10/2021   PROT 7.7 06/07/2022   ALBUMIN 3.9 06/07/2022   LABGLOB 2.9 05/10/2021   AGRATIO 1.6 05/10/2021   BILITOT 0.5 06/07/2022   ALKPHOS 69 06/07/2022   AST 20 06/07/2022   ALT 13 06/07/2022   ANIONGAP 11 06/07/2022   He also continues to follow up with Dr. Maryruth Bun for BPAD and seems to be doing well on current medications.   Medications: Outpatient Medications Prior to Visit  Medication Sig Note    cholecalciferol (VITAMIN D3) 25 MCG (1000 UT) tablet Take 1,000 Units by mouth daily.    Coenzyme Q10 (COQ10) 100 MG CAPS Take 100 mg by mouth daily.     divalproex (DEPAKOTE ER) 500 MG 24 hr tablet Take 1,000 mg by mouth at bedtime.     omeprazole (PRILOSEC) 40 MG capsule TAKE 1 CAPSULE BY MOUTH EVERY DAY    simvastatin (ZOCOR) 40 MG tablet TAKE 1 TABLET BY MOUTH EVERY DAY AT 6PM    vitamin B-12 (CYANOCOBALAMIN) 1000 MCG tablet Take 1,000 mcg by mouth daily. 06/14/2022: Three times weekly   ziprasidone (GEODON) 40 MG capsule Take 40 mg by mouth daily with supper.     aspirin 81 MG tablet Take 1 tablet (81 mg total) by mouth daily. (Patient not taking: Reported on 02/02/2022)    [DISCONTINUED] amoxicillin (AMOXIL) 500 MG capsule Take 1 capsule (500 mg total) by mouth 3 (three) times daily.    [DISCONTINUED] doxycycline (VIBRA-TABS) 100 MG tablet Take 1 tablet (100 mg total) by mouth 2 (two) times daily.    No facility-administered medications prior to visit.    Allergies  Allergen Reactions   Ropinirole Hcl Other (See Comments)    Confusion, agiitation, disorientation, Hallucinations   Tramadol  lethargy    Patient Care Team: Malva Limes, MD as PCP - General (Family Medicine) Darliss Ridgel, MD as Referring Physician (Psychiatry) Earna Coder, MD as Consulting Physician (Internal Medicine) Hulda Marin, MD (Inactive) as Referring Physician (Cardiothoracic Surgery) Leafy Ro, MD as Consulting Physician (General Surgery)  Review of Systems  Constitutional:  Negative for appetite change, chills and fever.  Respiratory:  Negative for chest tightness, shortness of breath and wheezing.   Cardiovascular:  Negative for chest pain and palpitations.  Gastrointestinal:  Negative for abdominal pain, nausea and vomiting.  Musculoskeletal:  Positive for myalgias (legs).  Neurological:  Positive for weakness (legs). Negative for dizziness and headaches.   Psychiatric/Behavioral:  Negative for agitation, behavioral problems and confusion.         Objective    Vitals: BP 121/60 (BP Location: Right Arm, Patient Position: Sitting, Cuff Size: Normal)   Pulse 60   Temp 97.7 F (36.5 C) (Oral)   Ht 5\' 10"  (1.778 m)   Wt 138 lb (62.6 kg)   SpO2 100%   BMI 19.80 kg/m     Physical Exam  General Appearance:    Thin male. Alert, cooperative, in no acute distress, appears stated age  Head:    Normocephalic, without obvious abnormality, atraumatic  Eyes:    PERRL, conjunctiva/corneas clear, EOM's intact, fundi    benign, both eyes       Ears:    Normal TM's and external ear canals, both ears  Nose:   Nares normal, septum midline, mucosa normal, no drainage   or sinus tenderness  Throat:   Lips, mucosa, and tongue normal; teeth and gums normal  Neck:   Supple, symmetrical, trachea midline, no adenopathy;       thyroid:  No enlargement/tenderness/nodules; no carotid   bruit or JVD  Back:     Symmetric, no curvature, ROM normal, no CVA tenderness  Lungs:     Clear to auscultation bilaterally, respirations unlabored  Chest wall:    No tenderness or deformity  Heart:    Normal heart rate. Normal rhythm. No murmurs, rubs, or gallops.  S1 and S2 normal  Abdomen:     Soft, non-tender, bowel sounds active all four quadrants,    no masses, no organomegaly  Genitalia:    deferred  Rectal:    deferred  Extremities:   All extremities are intact. No cyanosis or edema. Able to stand and get up exam table slowly, but without assistance.   Pulses:   2+ and symmetric all extremities  Skin:   Skin color, texture, turgor normal, no rashes or lesions  Lymph nodes:   Cervical, supraclavicular, and axillary nodes normal  Neurologic:   CNII-XII intact. Normal strength, sensation and reflexes      throughout     Assessment & Plan    1. Annual physical exam   2. Hyperglycemia He was not fasting last week when labs drawn at Dr. Sharlette Dense office, but  glucose still high for being non-fasting.  - Hemoglobin A1c  3. Prostate cancer screening  - PSA Total (Reflex To Free) ([facility] only)  4. Weakness of both lower extremities Is limiting ambulation.   - CK (Creatine Kinase)  Consider referral for LE vascular evaluation.  Consider putting statin on hold after reviewing labs.   5. Hyperlipidemia, unspecified hyperlipidemia type On simvastatin for many years, unclear if related to lower extremity muscle weakness.   - Lipid panel  6. Bipolar I disorder, single manic episode, moderate (  HCC) Doing well current regiment managed by Dr. Maryruth Bun.   7. Chronic obstructive pulmonary disease, unspecified COPD type (HCC) Asymptomatic.   8. Atherosclerosis of native coronary artery of native heart without angina pectoris Asymptomatic. Compliant with medication.  Continue aggressive risk factor modification.   - EKG 12-Lead  9. Cerebrovascular disease, unspecified Asymptomatic. Compliant with medication.  Continue aggressive risk factor modification.  Advised to start back on low dose daily ECASA.   10. Late effects of cerebrovascular disease   11. Aortic atherosclerosis (HCC)   12. Subclinical hypothyroidism  - TSH - T4, free    The entirety of the information documented in the History of Present Illness, Review of Systems and Physical Exam were personally obtained by me. Portions of this information were initially documented by the CMA and reviewed by me for thoroughness and accuracy.     Mila Merry, MD  [facility] [phone] (phone) [phone] (fax)  [facility]

## 2022-06-14 NOTE — Progress Notes (Signed)
Vivien Rota DeSanto,acting as a scribe for Mila Merry, MD.,have documented all relevant documentation on the behalf of Mila Merry, MD,as directed by  Mila Merry, MD    Annual Wellness Visit     Patient: [name], Male    DOB: 1946/01/03, 77 y.o.   MRN: [id number] Visit Date: 06/14/2022  Today's Provider: Mila Merry, MD   No chief complaint on file.  Subjective    [name] is a 77 y.o. male who presents today for his Annual Wellness Visit.   Medications: Outpatient Medications Prior to Visit  Medication Sig Note   aspirin 81 MG tablet Take 1 tablet (81 mg total) by mouth daily. (Patient not taking: Reported on 02/02/2022)    cholecalciferol (VITAMIN D3) 25 MCG (1000 UT) tablet Take 1,000 Units by mouth daily.    Coenzyme Q10 (COQ10) 100 MG CAPS Take 100 mg by mouth daily.     divalproex (DEPAKOTE ER) 500 MG 24 hr tablet Take 1,000 mg by mouth at bedtime.     omeprazole (PRILOSEC) 40 MG capsule TAKE 1 CAPSULE BY MOUTH EVERY DAY    simvastatin (ZOCOR) 40 MG tablet TAKE 1 TABLET BY MOUTH EVERY DAY AT 6PM    vitamin B-12 (CYANOCOBALAMIN) 1000 MCG tablet Take 1,000 mcg by mouth daily. 06/14/2022: Three times weekly   ziprasidone (GEODON) 40 MG capsule Take 40 mg by mouth daily with supper.     [DISCONTINUED] amoxicillin (AMOXIL) 500 MG capsule Take 1 capsule (500 mg total) by mouth 3 (three) times daily.    [DISCONTINUED] doxycycline (VIBRA-TABS) 100 MG tablet Take 1 tablet (100 mg total) by mouth 2 (two) times daily.    No facility-administered medications prior to visit.    Allergies  Allergen Reactions   Ropinirole Hcl Other (See Comments)    Confusion, agiitation, disorientation, Hallucinations   Tramadol     lethargy    Patient Care Team: Malva Limes, MD as PCP - General (Family Medicine) Darliss Ridgel, MD as Referring Physician (Psychiatry) Earna Coder, MD as Consulting Physician (Internal Medicine) Hulda Marin, MD (Inactive)  as Referring Physician (Cardiothoracic Surgery) Leafy Ro, MD as Consulting Physician (General Surgery)  Review of Systems  Constitutional: Negative.   HENT: Negative.    Eyes: Negative.   Respiratory: Negative.    Cardiovascular: Negative.   Gastrointestinal: Negative.   Endocrine: Negative.   Genitourinary: Negative.   Musculoskeletal: Negative.   Skin: Negative.   Allergic/Immunologic: Negative.   Neurological: Negative.   Hematological: Negative.   Psychiatric/Behavioral: Negative.           Objective    Vitals: BP 121/60 (BP Location: Right Arm, Patient Position: Sitting, Cuff Size: Normal)   Pulse 60   Temp 97.7 F (36.5 C) (Oral)   Ht 5\' 10"  (1.778 m)   Wt 138 lb (62.6 kg)   SpO2 100%   BMI 19.80 kg/m      Physical Exam   Most recent functional status assessment:    06/14/2022   10:11 AM  In your present state of health, do you have any difficulty performing the following activities:  Hearing? 1  Vision? 1  Difficulty concentrating or making decisions? 0  Walking or climbing stairs? 1  Dressing or bathing? 1  Doing errands, shopping? 1   Most recent fall risk assessment:    06/14/2022   10:10 AM  Fall Risk   Falls in the past year? 1  Number falls in past yr: 0  Injury  with Fall? 1    Most recent depression screenings:    06/14/2022   10:11 AM 05/10/2021   11:49 AM  PHQ 2/9 Scores  PHQ - 2 Score 0 3  PHQ- 9 Score 2 10   Most recent cognitive screening:    02/13/2020    9:05 AM  6CIT Screen  What Year? 0 points  What month? 0 points  What time? 0 points  Count back from 20 0 points  Months in reverse 0 points  Repeat phrase 4 points  Total Score 4 points   Most recent Audit-C alcohol use screening    06/14/2022   10:10 AM  Alcohol Use Disorder Test (AUDIT)  1. How often do you have a drink containing alcohol? 0  2. How many drinks containing alcohol do you have on a typical day when you are drinking? 0  3. How often do you have  six or more drinks on one occasion? 0  AUDIT-C Score 0   A score of 3 or more in women, and 4 or more in men indicates increased risk for alcohol abuse, EXCEPT if all of the points are from question 1   No results found for any visits on 06/14/22.  Assessment & Plan     Annual wellness visit done today including the all of the following: Reviewed patient's Family Medical History Reviewed and updated list of patient's medical providers Assessment of cognitive impairment was done Assessed patient's functional ability Established a written schedule for health screening services Health Risk Assessent Completed and Reviewed  Exercise Activities and Dietary recommendations  Goals   None     Immunization History  Administered Date(s) Administered   Fluad Quad(high Dose 65+) 10/28/2018   Influenza, High Dose Seasonal PF 10/08/2015, 10/31/2016, 10/27/2017   Pneumococcal Conjugate-13 07/14/2013   Pneumococcal Polysaccharide-23 07/04/2011, 01/07/2019, 06/11/2019   Tdap 06/04/2008   Zoster, Live 06/08/2009    Health Maintenance  Topic Date Due   COVID-19 Vaccine (1) Never done   Zoster Vaccines- Shingrix (1 of 2) Never done   DTaP/Tdap/Td (2 - Td or Tdap) 06/05/2018   Medicare Annual Wellness (AWV)  05/11/2022   INFLUENZA VACCINE  08/10/2022   Pneumonia Vaccine 95+ Years old  Completed   Hepatitis C Screening  Completed   HPV VACCINES  Aged Out   Colonoscopy  Discontinued     Discussed health benefits of physical activity, and encouraged him to engage in regular exercise appropriate for his age and condition.            Mila Merry, MD  [facility] [phone] (phone) [phone] (fax)  [facility]

## 2022-06-15 LAB — HEMOGLOBIN A1C
Est. average glucose Bld gHb Est-mCnc: 123 mg/dL
Hgb A1c MFr Bld: 5.9 % — ABNORMAL HIGH (ref 4.8–5.6)

## 2022-06-15 LAB — LIPID PANEL
Chol/HDL Ratio: 2.2 ratio (ref 0.0–5.0)
Cholesterol, Total: 178 mg/dL (ref 100–199)
HDL: 80 mg/dL (ref >39)
LDL Chol Calc (NIH): 83 mg/dL (ref 0–99)
Triglycerides: 82 mg/dL (ref 0–149)
VLDL Cholesterol Cal: 15 mg/dL (ref 5–40)

## 2022-06-15 LAB — T4, FREE: Free T4: 0.87 ng/dL (ref 0.82–1.77)

## 2022-06-15 LAB — PSA TOTAL (REFLEX TO FREE): Prostate Specific Ag, Serum: 2.5 ng/mL (ref 0.0–4.0)

## 2022-06-15 LAB — TSH: TSH: 4.7 u[IU]/mL — ABNORMAL HIGH (ref 0.450–4.500)

## 2022-06-15 LAB — CK: Total CK: 101 U/L (ref 41–331)

## 2022-06-21 DIAGNOSIS — H353231 Exudative age-related macular degeneration, bilateral, with active choroidal neovascularization: Secondary | ICD-10-CM | POA: Diagnosis not present

## 2022-07-19 DIAGNOSIS — F3172 Bipolar disorder, in full remission, most recent episode hypomanic: Secondary | ICD-10-CM | POA: Diagnosis not present

## 2022-07-20 DIAGNOSIS — H353231 Exudative age-related macular degeneration, bilateral, with active choroidal neovascularization: Secondary | ICD-10-CM | POA: Diagnosis not present

## 2022-09-07 DIAGNOSIS — H353221 Exudative age-related macular degeneration, left eye, with active choroidal neovascularization: Secondary | ICD-10-CM | POA: Diagnosis not present

## 2022-09-07 DIAGNOSIS — H353211 Exudative age-related macular degeneration, right eye, with active choroidal neovascularization: Secondary | ICD-10-CM | POA: Diagnosis not present

## 2022-09-07 DIAGNOSIS — H353231 Exudative age-related macular degeneration, bilateral, with active choroidal neovascularization: Secondary | ICD-10-CM | POA: Diagnosis not present

## 2022-10-17 ENCOUNTER — Telehealth: Payer: Self-pay | Admitting: Family Medicine

## 2022-10-17 DIAGNOSIS — F3172 Bipolar disorder, in full remission, most recent episode hypomanic: Secondary | ICD-10-CM | POA: Diagnosis not present

## 2022-10-17 NOTE — Telephone Encounter (Signed)
Patients sister called stated patient is having problems walking and was told by his mental provider that the cholesterol medication could be a contributor to why he is having issues walking. Please f/u with sister

## 2022-10-18 NOTE — Telephone Encounter (Signed)
Patient sister advised. Verbalized understanding.

## 2022-10-18 NOTE — Telephone Encounter (Signed)
He can stop the simvastatin for 2 weeks. If it gets better we can change to a different cholesterol medication.

## 2022-11-03 ENCOUNTER — Ambulatory Visit (INDEPENDENT_AMBULATORY_CARE_PROVIDER_SITE_OTHER): Payer: PPO | Admitting: Family Medicine

## 2022-11-03 VITALS — BP 124/72 | HR 95 | Ht 70.0 in | Wt 135.0 lb

## 2022-11-03 DIAGNOSIS — H9190 Unspecified hearing loss, unspecified ear: Secondary | ICD-10-CM

## 2022-11-03 DIAGNOSIS — R29898 Other symptoms and signs involving the musculoskeletal system: Secondary | ICD-10-CM

## 2022-11-03 DIAGNOSIS — E038 Other specified hypothyroidism: Secondary | ICD-10-CM

## 2022-11-03 DIAGNOSIS — R0609 Other forms of dyspnea: Secondary | ICD-10-CM

## 2022-11-03 DIAGNOSIS — G2581 Restless legs syndrome: Secondary | ICD-10-CM | POA: Diagnosis not present

## 2022-11-03 DIAGNOSIS — D649 Anemia, unspecified: Secondary | ICD-10-CM

## 2022-11-03 DIAGNOSIS — H9113 Presbycusis, bilateral: Secondary | ICD-10-CM | POA: Insufficient documentation

## 2022-11-03 DIAGNOSIS — J209 Acute bronchitis, unspecified: Secondary | ICD-10-CM

## 2022-11-03 DIAGNOSIS — R0602 Shortness of breath: Secondary | ICD-10-CM

## 2022-11-03 DIAGNOSIS — J40 Bronchitis, not specified as acute or chronic: Secondary | ICD-10-CM

## 2022-11-03 MED ORDER — DOXYCYCLINE HYCLATE 100 MG PO TABS
100.0000 mg | ORAL_TABLET | Freq: Two times a day (BID) | ORAL | 0 refills | Status: AC
Start: 2022-11-03 — End: 2022-11-13

## 2022-11-03 NOTE — Progress Notes (Signed)
Established patient visit   Patient: [name]   DOB: Aug 25, 1945   77 y.o. Male  MRN: [id number] Visit Date: 11/03/2022  Today's healthcare provider: Mila Merry, MD   No chief complaint on file.  Subjective    Discussed the use of AI scribe software for clinical note transcription with the patient, who gave verbal consent to proceed.  History of Present Illness   The patient, with a history of hearing impairment, presented with complaints of decreased hearing acuity. He reports that he had hearing adjusted yesterday and told to have his ear canals checked to see if they were obstructed.   In addition to the hearing concerns, the patient has been experiencing a cough for approximately four days. No self-treatment measures were mentioned. The patient also reported worsening leg weakness and increased breathlessness since discontinuing his cholesterol medication. The patient's mobility has been significantly affected, with difficulty walking noted.  The patient's caregiver also mentioned concerns about the patient's cholesterol management, indicating that the patient had stopped rosuvastatin a few months ago due to leg weakness, which has only gotten worse since then.       Medications: Outpatient Medications Prior to Visit  Medication Sig   aspirin 81 MG tablet Take 1 tablet (81 mg total) by mouth daily.   cholecalciferol (VITAMIN D3) 25 MCG (1000 UT) tablet Take 1,000 Units by mouth daily.   Coenzyme Q10 (COQ10) 100 MG CAPS Take 100 mg by mouth daily.    divalproex (DEPAKOTE ER) 500 MG 24 hr tablet Take 1,000 mg by mouth at bedtime.    omeprazole (PRILOSEC) 40 MG capsule TAKE 1 CAPSULE BY MOUTH EVERY DAY   simvastatin (ZOCOR) 40 MG tablet TAKE 1 TABLET BY MOUTH EVERY DAY AT 6PM   vitamin B-12 (CYANOCOBALAMIN) 1000 MCG tablet Take 1,000 mcg by mouth daily.   ziprasidone (GEODON) 40 MG capsule Take 40 mg by mouth daily with supper.    No facility-administered  medications prior to visit.   Review of Systems     Objective    BP 124/72 (BP Location: Left Arm, Patient Position: Sitting, Cuff Size: Normal)   Pulse 95   Ht 5\' 10"  (1.778 m)   Wt 135 lb (61.2 kg)   SpO2 95%   BMI 19.37 kg/m   Physical Exam   General: Appearance:    Thin male in no acute distress  Ears:    Ear canals clear and patent, no cerumen.        Lungs:     Clear to auscultation bilaterally, respirations unlabored  Heart:    Normal heart rate. Normal rhythm. No murmurs, rubs, or gallops.    MS:   All extremities are intact.    Neurologic:   Awake, alert, oriented x 3. Shuffled gait. Slow to rise from chair due to leg weakness.         Assessment & Plan        Hearing Difficulty Complaint of decreased hearing, ear canals completely clear today -Continue use of hearing aids.  Acute Bronchitis Reports of a cough for the past four days with auscultation revealing wheezing. -Doxycycline 100 twice a day for 10 days.   Leg Weakness and Shortness of Breath Reports of worsening leg weakness and increased shortness of breath since discontinuing cholesterol medication. -Resume rosuvastatin  -check cbc, ferritin, bnp and sed rate.    No follow-ups on file.      Mila Merry, MD  [facility]  [phone] (phone) [phone] (fax)  [facility]

## 2022-11-07 DIAGNOSIS — H353221 Exudative age-related macular degeneration, left eye, with active choroidal neovascularization: Secondary | ICD-10-CM | POA: Diagnosis not present

## 2022-11-07 DIAGNOSIS — H353211 Exudative age-related macular degeneration, right eye, with active choroidal neovascularization: Secondary | ICD-10-CM | POA: Diagnosis not present

## 2022-11-07 DIAGNOSIS — H353231 Exudative age-related macular degeneration, bilateral, with active choroidal neovascularization: Secondary | ICD-10-CM | POA: Diagnosis not present

## 2022-11-07 LAB — COMPREHENSIVE METABOLIC PANEL
ALT: 12 [IU]/L (ref 0–44)
AST: 18 [IU]/L (ref 0–40)
Albumin: 3.5 g/dL — ABNORMAL LOW (ref 3.8–4.8)
Alkaline Phosphatase: 97 IU/L (ref 44–121)
BUN/Creatinine Ratio: 18 (ref 10–24)
BUN: 22 mg/dL (ref 8–27)
Bilirubin Total: 0.5 mg/dL (ref 0.0–1.2)
CO2: 23 mmol/L (ref 20–29)
Calcium: 9.7 mg/dL (ref 8.6–10.2)
Chloride: 101 mmol/L (ref 96–106)
Creatinine, Ser: 1.24 mg/dL (ref 0.76–1.27)
Globulin, Total: 3.2 g/dL (ref 1.5–4.5)
Glucose: 93 mg/dL (ref 70–99)
Potassium: 4.3 mmol/L (ref 3.5–5.2)
Sodium: 140 mmol/L (ref 134–144)
Total Protein: 6.7 g/dL (ref 6.0–8.5)
eGFR: 60 mL/min/{1.73_m2} (ref >59)

## 2022-11-07 LAB — SEDIMENTATION RATE: Sed Rate: 15 mm/h (ref 0–30)

## 2022-11-07 LAB — CBC
Hematocrit: 36.3 % — ABNORMAL LOW (ref 37.5–51.0)
Hemoglobin: 12.2 g/dL — ABNORMAL LOW (ref 13.0–17.7)
MCH: 33.4 pg — ABNORMAL HIGH (ref 26.6–33.0)
MCHC: 33.6 g/dL (ref 31.5–35.7)
MCV: 100 fL — ABNORMAL HIGH (ref 79–97)
Platelets: 303 10*3/uL (ref 150–450)
RBC: 3.65 x10E6/uL — ABNORMAL LOW (ref 4.14–5.80)
RDW: 13.1 % (ref 11.6–15.4)
WBC: 10.5 10*3/uL (ref 3.4–10.8)

## 2022-11-07 LAB — TSH: TSH: 2.58 u[IU]/mL (ref 0.450–4.500)

## 2022-11-07 LAB — BRAIN NATRIURETIC PEPTIDE: BNP: 87.4 pg/mL (ref 0.0–100.0)

## 2022-11-07 LAB — FERRITIN: Ferritin: 402 ng/mL — ABNORMAL HIGH (ref 30–400)

## 2022-11-08 ENCOUNTER — Other Ambulatory Visit: Payer: Self-pay | Admitting: Family Medicine

## 2022-11-08 DIAGNOSIS — J449 Chronic obstructive pulmonary disease, unspecified: Secondary | ICD-10-CM

## 2022-11-08 MED ORDER — UMECLIDINIUM BROMIDE 62.5 MCG/ACT IN AEPB
1.0000 | INHALATION_SPRAY | Freq: Every day | RESPIRATORY_TRACT | 2 refills | Status: DC
Start: 2022-11-08 — End: 2023-03-18

## 2022-11-26 ENCOUNTER — Emergency Department: Payer: PPO

## 2022-11-26 ENCOUNTER — Inpatient Hospital Stay
Admission: EM | Admit: 2022-11-26 | Discharge: 2022-11-30 | DRG: 193 | Disposition: A | Discharge status: Home / Self Care | Payer: PPO | Attending: Internal Medicine | Admitting: Internal Medicine

## 2022-11-26 ENCOUNTER — Other Ambulatory Visit: Payer: Self-pay

## 2022-11-26 DIAGNOSIS — Z833 Family history of diabetes mellitus: Secondary | ICD-10-CM

## 2022-11-26 DIAGNOSIS — Z885 Allergy status to narcotic agent status: Secondary | ICD-10-CM

## 2022-11-26 DIAGNOSIS — G629 Polyneuropathy, unspecified: Secondary | ICD-10-CM | POA: Diagnosis present

## 2022-11-26 DIAGNOSIS — R0602 Shortness of breath: Secondary | ICD-10-CM | POA: Diagnosis not present

## 2022-11-26 DIAGNOSIS — D539 Nutritional anemia, unspecified: Secondary | ICD-10-CM | POA: Diagnosis present

## 2022-11-26 DIAGNOSIS — C3490 Malignant neoplasm of unspecified part of unspecified bronchus or lung: Secondary | ICD-10-CM | POA: Diagnosis not present

## 2022-11-26 DIAGNOSIS — J189 Pneumonia, unspecified organism: Principal | ICD-10-CM | POA: Diagnosis present

## 2022-11-26 DIAGNOSIS — J449 Chronic obstructive pulmonary disease, unspecified: Secondary | ICD-10-CM | POA: Insufficient documentation

## 2022-11-26 DIAGNOSIS — J168 Pneumonia due to other specified infectious organisms: Secondary | ICD-10-CM | POA: Diagnosis not present

## 2022-11-26 DIAGNOSIS — Z888 Allergy status to other drugs, medicaments and biological substances status: Secondary | ICD-10-CM

## 2022-11-26 DIAGNOSIS — R64 Cachexia: Secondary | ICD-10-CM | POA: Diagnosis present

## 2022-11-26 DIAGNOSIS — Z974 Presence of external hearing-aid: Secondary | ICD-10-CM

## 2022-11-26 DIAGNOSIS — R54 Age-related physical debility: Secondary | ICD-10-CM | POA: Diagnosis present

## 2022-11-26 DIAGNOSIS — R918 Other nonspecific abnormal finding of lung field: Secondary | ICD-10-CM | POA: Diagnosis not present

## 2022-11-26 DIAGNOSIS — R202 Paresthesia of skin: Principal | ICD-10-CM

## 2022-11-26 DIAGNOSIS — Z66 Do not resuscitate: Secondary | ICD-10-CM | POA: Diagnosis present

## 2022-11-26 DIAGNOSIS — Z555 Less than a high school diploma: Secondary | ICD-10-CM

## 2022-11-26 DIAGNOSIS — I214 Non-ST elevation (NSTEMI) myocardial infarction: Secondary | ICD-10-CM | POA: Diagnosis present

## 2022-11-26 DIAGNOSIS — N400 Enlarged prostate without lower urinary tract symptoms: Secondary | ICD-10-CM | POA: Diagnosis present

## 2022-11-26 DIAGNOSIS — Z79899 Other long term (current) drug therapy: Secondary | ICD-10-CM

## 2022-11-26 DIAGNOSIS — D7589 Other specified diseases of blood and blood-forming organs: Secondary | ICD-10-CM | POA: Diagnosis present

## 2022-11-26 DIAGNOSIS — H918X3 Other specified hearing loss, bilateral: Secondary | ICD-10-CM | POA: Diagnosis present

## 2022-11-26 DIAGNOSIS — E785 Hyperlipidemia, unspecified: Secondary | ICD-10-CM | POA: Diagnosis present

## 2022-11-26 DIAGNOSIS — Z87891 Personal history of nicotine dependence: Secondary | ICD-10-CM

## 2022-11-26 DIAGNOSIS — K219 Gastro-esophageal reflux disease without esophagitis: Secondary | ICD-10-CM | POA: Diagnosis present

## 2022-11-26 DIAGNOSIS — I2489 Other forms of acute ischemic heart disease: Secondary | ICD-10-CM | POA: Diagnosis present

## 2022-11-26 DIAGNOSIS — E43 Unspecified severe protein-calorie malnutrition: Secondary | ICD-10-CM | POA: Diagnosis present

## 2022-11-26 DIAGNOSIS — F429 Obsessive-compulsive disorder, unspecified: Secondary | ICD-10-CM | POA: Diagnosis present

## 2022-11-26 DIAGNOSIS — G2581 Restless legs syndrome: Secondary | ICD-10-CM | POA: Diagnosis present

## 2022-11-26 DIAGNOSIS — J440 Chronic obstructive pulmonary disease with acute lower respiratory infection: Secondary | ICD-10-CM | POA: Diagnosis present

## 2022-11-26 DIAGNOSIS — J439 Emphysema, unspecified: Secondary | ICD-10-CM | POA: Diagnosis present

## 2022-11-26 DIAGNOSIS — Z681 Body mass index (BMI) 19 or less, adult: Secondary | ICD-10-CM

## 2022-11-26 DIAGNOSIS — Z85118 Personal history of other malignant neoplasm of bronchus and lung: Secondary | ICD-10-CM

## 2022-11-26 DIAGNOSIS — M19042 Primary osteoarthritis, left hand: Secondary | ICD-10-CM | POA: Diagnosis present

## 2022-11-26 DIAGNOSIS — R531 Weakness: Secondary | ICD-10-CM | POA: Diagnosis not present

## 2022-11-26 DIAGNOSIS — M19041 Primary osteoarthritis, right hand: Secondary | ICD-10-CM | POA: Diagnosis present

## 2022-11-26 DIAGNOSIS — Z7982 Long term (current) use of aspirin: Secondary | ICD-10-CM

## 2022-11-26 DIAGNOSIS — R0609 Other forms of dyspnea: Secondary | ICD-10-CM

## 2022-11-26 DIAGNOSIS — F319 Bipolar disorder, unspecified: Secondary | ICD-10-CM | POA: Diagnosis present

## 2022-11-26 DIAGNOSIS — Z8249 Family history of ischemic heart disease and other diseases of the circulatory system: Secondary | ICD-10-CM

## 2022-11-26 DIAGNOSIS — J432 Centrilobular emphysema: Secondary | ICD-10-CM | POA: Diagnosis not present

## 2022-11-26 LAB — CBC WITH DIFFERENTIAL/PLATELET
Abs Immature Granulocytes: 0.04 10*3/uL (ref 0.00–0.07)
Basophils Absolute: 0 10*3/uL (ref 0.0–0.1)
Basophils Relative: 0 %
Eosinophils Absolute: 0 10*3/uL (ref 0.0–0.5)
Eosinophils Relative: 0 %
HCT: 37.9 % — ABNORMAL LOW (ref 39.0–52.0)
Hemoglobin: 12.2 g/dL — ABNORMAL LOW (ref 13.0–17.0)
Immature Granulocytes: 0 %
Lymphocytes Relative: 13 %
Lymphs Abs: 1.4 10*3/uL (ref 0.7–4.0)
MCH: 33.6 pg (ref 26.0–34.0)
MCHC: 32.2 g/dL (ref 30.0–36.0)
MCV: 104.4 fL — ABNORMAL HIGH (ref 80.0–100.0)
Monocytes Absolute: 0.8 10*3/uL (ref 0.1–1.0)
Monocytes Relative: 8 %
Neutro Abs: 8.5 10*3/uL — ABNORMAL HIGH (ref 1.7–7.7)
Neutrophils Relative %: 79 %
Platelets: 197 10*3/uL (ref 150–400)
RBC: 3.63 MIL/uL — ABNORMAL LOW (ref 4.22–5.81)
RDW: 14.8 % (ref 11.5–15.5)
WBC: 10.8 10*3/uL — ABNORMAL HIGH (ref 4.0–10.5)
nRBC: 0 % (ref 0.0–0.2)

## 2022-11-26 LAB — CK: Total CK: 60 U/L (ref 49–397)

## 2022-11-26 LAB — BASIC METABOLIC PANEL
Anion gap: 7 (ref 5–15)
BUN: 26 mg/dL — ABNORMAL HIGH (ref 8–23)
CO2: 27 mmol/L (ref 22–32)
Calcium: 9.4 mg/dL (ref 8.9–10.3)
Chloride: 107 mmol/L (ref 98–111)
Creatinine, Ser: 1.05 mg/dL (ref 0.61–1.24)
GFR, Estimated: >60 mL/min (ref >60)
Glucose, Bld: 124 mg/dL — ABNORMAL HIGH (ref 70–99)
Potassium: 4.3 mmol/L (ref 3.5–5.1)
Sodium: 141 mmol/L (ref 135–145)

## 2022-11-26 LAB — TROPONIN I (HIGH SENSITIVITY): Troponin I (High Sensitivity): 301 ng/L (ref <18)

## 2022-11-26 LAB — FOLATE: Folate: 18.5 ng/mL (ref >5.9)

## 2022-11-26 LAB — BRAIN NATRIURETIC PEPTIDE: B Natriuretic Peptide: 77 pg/mL (ref 0.0–100.0)

## 2022-11-26 NOTE — ED Provider Triage Note (Signed)
Emergency Medicine Provider Triage Evaluation Note  [name] , a 77 y.o. male  was evaluated in triage.  Pt complains of trouble walking and weakness. States this has been going on for 6 months but today things were acutely worse. SOB has been consistent for 2 months.   Review of Systems  Positive: SOB, bilateral weakness, back pain Negative: CP  Physical Exam  There were no vitals taken for this visit. Gen:   Awake, no distress   Resp:  Normal effort  MSK:   Moves extremities without difficulty  Other:    Medical Decision Making  Medically screening exam initiated at 3:01 PM.  Appropriate orders placed.  [name] was informed that the remainder of the evaluation will be completed by another provider, this initial triage assessment does not replace that evaluation, and the importance of remaining in the ED until their evaluation is complete.     Kevin Ali, PA-C 11/26/22 1504

## 2022-11-26 NOTE — ED Triage Notes (Signed)
Pt presents with progressive worsening of weakness x 6 months and ShOB for the last 2 months. Today pt having difficulty standing by himself.

## 2022-11-26 NOTE — ED Provider Notes (Signed)
[facility] Provider Note    Event Date/Time   First MD Initiated Contact with Patient 11/26/22 2206     (approximate)   History   Chief Complaint: Weakness   HPI  [name] is a 77 y.o. male with a history of GERD, bipolar disorder, memory impairment, BPH who comes to the ED due to progressive generalized weakness for the past 6 months.  Also has dyspnea on exertion slowly worsening over the past 2 to 3 months.  5 days ago had a fall when walking to the mailbox due to getting out of breath and feeling too fatigued.  He notes feeling "weak" in his legs, but clarifies that this is a feeling of altered sensation.  Denies head trauma or syncope.  No fever or neck pain.  Takes B12 supplements.          Physical Exam   Triage Vital Signs: ED Triage Vitals  Encounter Vitals Group     BP 11/26/22 1503 (!) 142/82     Systolic BP Percentile --      Diastolic BP Percentile --      Pulse Rate 11/26/22 1503 79     Resp 11/26/22 1503 20     Temp 11/26/22 1503 98.7 F (37.1 C)     Temp Source 11/26/22 1503 Oral     SpO2 11/26/22 1503 100 %     Weight 11/26/22 1504 135 lb (61.2 kg)     Height 11/26/22 1504 5\' 10"  (1.778 m)     Head Circumference --      Peak Flow --      Pain Score 11/26/22 1503 0     Pain Loc --      Pain Education --      Exclude from Growth Chart --     Most recent vital signs: Vitals:   11/26/22 1953 11/26/22 2122  BP: 138/89 139/77  Pulse: 72 74  Resp: 18 18  Temp: 98.6 F (37 C) 98.4 F (36.9 C)  SpO2: 100% 100%    General: Awake, no distress.  CV:  Good peripheral perfusion.  Regular rate and rhythm.  Normal distal pulses Resp:  Normal effort.  Clear to auscultation bilaterally without crackles or wheezing Abd:  No distention.  Soft nontender Other:  Cranial nerves III through XII intact.  Full strength all 4 extremities.  Normal plantarflexion and dorsiflexion in the feet.  Normal patellar reflexes.  Intact,  symmetric sensation.  Normal finger-nose-finger.  Slight pronation left upper extremity.   ED Results / Procedures / Treatments   Labs (all labs ordered are listed, but only abnormal results are displayed) Labs Reviewed  CBC WITH DIFFERENTIAL/PLATELET - Abnormal; Notable for the following components:      Result Value   WBC 10.8 (*)    RBC 3.63 (*)    Hemoglobin 12.2 (*)    HCT 37.9 (*)    MCV 104.4 (*)    Neutro Abs 8.5 (*)    All other components within normal limits  BASIC METABOLIC PANEL - Abnormal; Notable for the following components:   Glucose, Bld 124 (*)    BUN 26 (*)    All other components within normal limits  BRAIN NATRIURETIC PEPTIDE  URINALYSIS, ROUTINE W REFLEX MICROSCOPIC  FOLATE  CK  SEDIMENTATION RATE  VITAMIN B12  TROPONIN I (HIGH SENSITIVITY)  TROPONIN I (HIGH SENSITIVITY)     EKG Interpreted by me Normal sinus rhythm rate of 76.  Normal axis  intervals QRS ST segments and T waves   RADIOLOGY Chest x-ray interpreted by me, overall unremarkable but with possible hazy opacity in the posterior lung in line with diaphragm   PROCEDURES:  Procedures   MEDICATIONS ORDERED IN ED: Medications - No data to display   IMPRESSION / MDM / ASSESSMENT AND PLAN / ED COURSE  I reviewed the triage vital signs and the nursing notes.  DDx: Dehydration, anemia, electrolyte abnormality, AKI, intracranial mass, pneumonia, UTI, urinary retention, non-STEMI, myositis.  Doubt syphilis  Patient's presentation is most consistent with acute presentation with potential threat to life or bodily function.  Patient presents with chronic but gradually progressive lower extremity sensory issues along with dyspnea on exertion.  Initial labs unremarkable, will add on CK, folate, B12, ESR, troponin.  Will obtain bladder scan and urinalysis.  Will obtain CT head and CT chest without contrast       FINAL CLINICAL IMPRESSION(S) / ED DIAGNOSES   Final diagnoses:   Paresthesia of both lower extremities     Rx / DC Orders   ED Discharge Orders     None        Note:  This document was prepared using Dragon voice recognition software and may include unintentional dictation errors.   Sharman Cheek, MD 11/26/22 2251

## 2022-11-27 ENCOUNTER — Encounter: Payer: Self-pay | Admitting: Family Medicine

## 2022-11-27 ENCOUNTER — Inpatient Hospital Stay (HOSPITAL_COMMUNITY)
Admit: 2022-11-27 | Discharge: 2022-11-27 | Disposition: A | Discharge status: Home / Self Care | Payer: PPO | Attending: Family Medicine

## 2022-11-27 DIAGNOSIS — J449 Chronic obstructive pulmonary disease, unspecified: Secondary | ICD-10-CM | POA: Insufficient documentation

## 2022-11-27 DIAGNOSIS — R0609 Other forms of dyspnea: Secondary | ICD-10-CM | POA: Diagnosis not present

## 2022-11-27 DIAGNOSIS — Z87891 Personal history of nicotine dependence: Secondary | ICD-10-CM | POA: Diagnosis not present

## 2022-11-27 DIAGNOSIS — D539 Nutritional anemia, unspecified: Secondary | ICD-10-CM | POA: Diagnosis not present

## 2022-11-27 DIAGNOSIS — N400 Enlarged prostate without lower urinary tract symptoms: Secondary | ICD-10-CM | POA: Diagnosis not present

## 2022-11-27 DIAGNOSIS — Z515 Encounter for palliative care: Secondary | ICD-10-CM | POA: Diagnosis not present

## 2022-11-27 DIAGNOSIS — Z8249 Family history of ischemic heart disease and other diseases of the circulatory system: Secondary | ICD-10-CM | POA: Diagnosis not present

## 2022-11-27 DIAGNOSIS — J189 Pneumonia, unspecified organism: Secondary | ICD-10-CM

## 2022-11-27 DIAGNOSIS — F319 Bipolar disorder, unspecified: Secondary | ICD-10-CM | POA: Diagnosis not present

## 2022-11-27 DIAGNOSIS — Z85118 Personal history of other malignant neoplasm of bronchus and lung: Secondary | ICD-10-CM | POA: Diagnosis not present

## 2022-11-27 DIAGNOSIS — J440 Chronic obstructive pulmonary disease with acute lower respiratory infection: Secondary | ICD-10-CM | POA: Diagnosis not present

## 2022-11-27 DIAGNOSIS — Z7982 Long term (current) use of aspirin: Secondary | ICD-10-CM | POA: Diagnosis not present

## 2022-11-27 DIAGNOSIS — E43 Unspecified severe protein-calorie malnutrition: Secondary | ICD-10-CM | POA: Diagnosis not present

## 2022-11-27 DIAGNOSIS — I214 Non-ST elevation (NSTEMI) myocardial infarction: Secondary | ICD-10-CM

## 2022-11-27 DIAGNOSIS — R7989 Other specified abnormal findings of blood chemistry: Secondary | ICD-10-CM | POA: Diagnosis not present

## 2022-11-27 DIAGNOSIS — R54 Age-related physical debility: Secondary | ICD-10-CM | POA: Diagnosis not present

## 2022-11-27 DIAGNOSIS — C3490 Malignant neoplasm of unspecified part of unspecified bronchus or lung: Secondary | ICD-10-CM | POA: Diagnosis not present

## 2022-11-27 DIAGNOSIS — J439 Emphysema, unspecified: Secondary | ICD-10-CM | POA: Diagnosis not present

## 2022-11-27 DIAGNOSIS — Z833 Family history of diabetes mellitus: Secondary | ICD-10-CM | POA: Diagnosis not present

## 2022-11-27 DIAGNOSIS — F429 Obsessive-compulsive disorder, unspecified: Secondary | ICD-10-CM | POA: Diagnosis not present

## 2022-11-27 DIAGNOSIS — Z79899 Other long term (current) drug therapy: Secondary | ICD-10-CM | POA: Diagnosis not present

## 2022-11-27 DIAGNOSIS — K219 Gastro-esophageal reflux disease without esophagitis: Secondary | ICD-10-CM | POA: Diagnosis not present

## 2022-11-27 DIAGNOSIS — Z555 Less than a high school diploma: Secondary | ICD-10-CM | POA: Diagnosis not present

## 2022-11-27 DIAGNOSIS — R531 Weakness: Secondary | ICD-10-CM | POA: Diagnosis present

## 2022-11-27 DIAGNOSIS — E785 Hyperlipidemia, unspecified: Secondary | ICD-10-CM | POA: Diagnosis not present

## 2022-11-27 DIAGNOSIS — R202 Paresthesia of skin: Secondary | ICD-10-CM | POA: Diagnosis not present

## 2022-11-27 DIAGNOSIS — Z681 Body mass index (BMI) 19 or less, adult: Secondary | ICD-10-CM | POA: Diagnosis not present

## 2022-11-27 DIAGNOSIS — Z66 Do not resuscitate: Secondary | ICD-10-CM | POA: Diagnosis not present

## 2022-11-27 DIAGNOSIS — D7589 Other specified diseases of blood and blood-forming organs: Secondary | ICD-10-CM | POA: Diagnosis not present

## 2022-11-27 DIAGNOSIS — G2581 Restless legs syndrome: Secondary | ICD-10-CM | POA: Diagnosis not present

## 2022-11-27 DIAGNOSIS — I2489 Other forms of acute ischemic heart disease: Secondary | ICD-10-CM | POA: Diagnosis not present

## 2022-11-27 DIAGNOSIS — R64 Cachexia: Secondary | ICD-10-CM | POA: Diagnosis not present

## 2022-11-27 LAB — BASIC METABOLIC PANEL
Anion gap: 9 (ref 5–15)
BUN: 29 mg/dL — ABNORMAL HIGH (ref 8–23)
CO2: 25 mmol/L (ref 22–32)
Calcium: 8.5 mg/dL — ABNORMAL LOW (ref 8.9–10.3)
Chloride: 108 mmol/L (ref 98–111)
Creatinine, Ser: 0.89 mg/dL (ref 0.61–1.24)
GFR, Estimated: >60 mL/min (ref >60)
Glucose, Bld: 81 mg/dL (ref 70–99)
Potassium: 3.9 mmol/L (ref 3.5–5.1)
Sodium: 142 mmol/L (ref 135–145)

## 2022-11-27 LAB — CBC
HCT: 29.3 % — ABNORMAL LOW (ref 39.0–52.0)
Hemoglobin: 9.6 g/dL — ABNORMAL LOW (ref 13.0–17.0)
MCH: 33.4 pg (ref 26.0–34.0)
MCHC: 32.8 g/dL (ref 30.0–36.0)
MCV: 102.1 fL — ABNORMAL HIGH (ref 80.0–100.0)
Platelets: 130 10*3/uL — ABNORMAL LOW (ref 150–400)
RBC: 2.87 MIL/uL — ABNORMAL LOW (ref 4.22–5.81)
RDW: 14.8 % (ref 11.5–15.5)
WBC: 7.8 10*3/uL (ref 4.0–10.5)
nRBC: 0 % (ref 0.0–0.2)

## 2022-11-27 LAB — URINALYSIS, ROUTINE W REFLEX MICROSCOPIC
Bilirubin Urine: NEGATIVE
Glucose, UA: >=500 mg/dL — AB
Hgb urine dipstick: NEGATIVE
Ketones, ur: NEGATIVE mg/dL
Leukocytes,Ua: NEGATIVE
Nitrite: NEGATIVE
Protein, ur: NEGATIVE mg/dL
Specific Gravity, Urine: 1.022 (ref 1.005–1.030)
Squamous Epithelial / HPF: 0 /[HPF] (ref 0–5)
pH: 5 (ref 5.0–8.0)

## 2022-11-27 LAB — ECHOCARDIOGRAM COMPLETE
AR max vel: 3 cm2
AV Area VTI: 3.24 cm2
AV Area mean vel: 2.83 cm2
AV Mean grad: 3 mm[Hg]
AV Peak grad: 5.6 mm[Hg]
Ao pk vel: 1.19 m/s
Area-P 1/2: 2.53 cm2
Height: 70 in
MV VTI: 2.79 cm2
S' Lateral: 2.8 cm
Weight: 2160 [oz_av]

## 2022-11-27 LAB — PROTIME-INR
INR: 1.1 (ref 0.8–1.2)
Prothrombin Time: 14.4 s (ref 11.4–15.2)

## 2022-11-27 LAB — HEPARIN LEVEL (UNFRACTIONATED)
Heparin Unfractionated: 0.19 [IU]/mL — ABNORMAL LOW (ref 0.30–0.70)
Heparin Unfractionated: 0.22 [IU]/mL — ABNORMAL LOW (ref 0.30–0.70)

## 2022-11-27 LAB — APTT: aPTT: 30 s (ref 24–36)

## 2022-11-27 LAB — TROPONIN I (HIGH SENSITIVITY)
Troponin I (High Sensitivity): 293 ng/L (ref <18)
Troponin I (High Sensitivity): 299 ng/L (ref <18)

## 2022-11-27 LAB — SEDIMENTATION RATE: Sed Rate: 41 mm/h — ABNORMAL HIGH (ref 0–20)

## 2022-11-27 LAB — HEMOGLOBIN: Hemoglobin: 9.5 g/dL — ABNORMAL LOW (ref 13.0–17.0)

## 2022-11-27 MED ORDER — MAGNESIUM HYDROXIDE 400 MG/5ML PO SUSP
30.0000 mL | Freq: Every day | ORAL | Status: DC | PRN
  Administered 2022-11-29: 30 mL via ORAL
  Filled 2022-11-27: qty 30

## 2022-11-27 MED ORDER — HEPARIN BOLUS VIA INFUSION
3700.0000 [IU] | Freq: Once | INTRAVENOUS | Status: AC
  Administered 2022-11-27: 3700 [IU] via INTRAVENOUS
  Filled 2022-11-27: qty 3700

## 2022-11-27 MED ORDER — SODIUM CHLORIDE 0.9 % IV BOLUS
500.0000 mL | Freq: Once | INTRAVENOUS | Status: AC
  Administered 2022-11-27: 500 mL via INTRAVENOUS

## 2022-11-27 MED ORDER — HEPARIN BOLUS VIA INFUSION
1000.0000 [IU] | Freq: Once | INTRAVENOUS | Status: AC
  Administered 2022-11-27: 1000 [IU] via INTRAVENOUS
  Filled 2022-11-27: qty 1000

## 2022-11-27 MED ORDER — AZITHROMYCIN 500 MG IV SOLR
500.0000 mg | Freq: Once | INTRAVENOUS | Status: AC
  Administered 2022-11-27: 500 mg via INTRAVENOUS
  Filled 2022-11-27: qty 5

## 2022-11-27 MED ORDER — ASPIRIN 81 MG PO CHEW
324.0000 mg | CHEWABLE_TABLET | Freq: Once | ORAL | Status: AC
Start: 2022-11-27 — End: 2022-11-27
  Administered 2022-11-27: 324 mg via ORAL
  Filled 2022-11-27: qty 4

## 2022-11-27 MED ORDER — TRAZODONE HCL 50 MG PO TABS
25.0000 mg | ORAL_TABLET | Freq: Every evening | ORAL | Status: DC | PRN
  Filled 2022-11-27: qty 1

## 2022-11-27 MED ORDER — ASPIRIN 81 MG PO TBEC
81.0000 mg | DELAYED_RELEASE_TABLET | Freq: Every day | ORAL | Status: DC
  Administered 2022-11-27 – 2022-11-30 (×4): 81 mg via ORAL
  Filled 2022-11-27 (×4): qty 1

## 2022-11-27 MED ORDER — DIVALPROEX SODIUM 500 MG PO DR TAB
1000.0000 mg | DELAYED_RELEASE_TABLET | Freq: Once | ORAL | Status: AC
  Administered 2022-11-27: 1000 mg via ORAL
  Filled 2022-11-27: qty 2

## 2022-11-27 MED ORDER — AZITHROMYCIN 250 MG PO TABS
500.0000 mg | ORAL_TABLET | Freq: Every day | ORAL | Status: DC
  Administered 2022-11-27 – 2022-11-29 (×3): 500 mg via ORAL
  Filled 2022-11-27 (×3): qty 2

## 2022-11-27 MED ORDER — NICOTINE 14 MG/24HR TD PT24
14.0000 mg | MEDICATED_PATCH | Freq: Every day | TRANSDERMAL | Status: DC
  Administered 2022-11-27 – 2022-11-29 (×3): 14 mg via TRANSDERMAL
  Filled 2022-11-27 (×3): qty 1

## 2022-11-27 MED ORDER — HEPARIN (PORCINE) 25000 UT/250ML-% IV SOLN
1150.0000 [IU]/h | INTRAVENOUS | Status: DC
  Administered 2022-11-27: 1000 [IU]/h via INTRAVENOUS
  Administered 2022-11-27: 800 [IU]/h via INTRAVENOUS
  Filled 2022-11-27 (×2): qty 250

## 2022-11-27 MED ORDER — DEXTROSE 5 % IV SOLN: 500.0000 mg | INTRAVENOUS | Status: DC

## 2022-11-27 MED ORDER — IPRATROPIUM-ALBUTEROL 0.5-2.5 (3) MG/3ML IN SOLN: 3.0000 mL | RESPIRATORY_TRACT | Status: DC | PRN

## 2022-11-27 MED ORDER — COQ10 100 MG PO CAPS: 100.0000 mg | ORAL_CAPSULE | Freq: Every day | ORAL | Status: DC

## 2022-11-27 MED ORDER — SODIUM CHLORIDE 0.9 % IV SOLN: INTRAVENOUS | Status: DC

## 2022-11-27 MED ORDER — HYDROCOD POLI-CHLORPHE POLI ER 10-8 MG/5ML PO SUER: 5.0000 mL | Freq: Two times a day (BID) | ORAL | Status: DC | PRN

## 2022-11-27 MED ORDER — SODIUM CHLORIDE 0.9 % IV SOLN
1.0000 g | Freq: Once | INTRAVENOUS | Status: AC
  Administered 2022-11-27: 1 g via INTRAVENOUS
  Filled 2022-11-27: qty 10

## 2022-11-27 MED ORDER — PANTOPRAZOLE SODIUM 40 MG PO TBEC
40.0000 mg | DELAYED_RELEASE_TABLET | Freq: Every day | ORAL | Status: DC
  Administered 2022-11-27 – 2022-11-30 (×4): 40 mg via ORAL
  Filled 2022-11-27 (×4): qty 1

## 2022-11-27 MED ORDER — VITAMIN B-12 1000 MCG PO TABS
1000.0000 ug | ORAL_TABLET | Freq: Every day | ORAL | Status: DC
  Administered 2022-11-27 – 2022-11-30 (×4): 1000 ug via ORAL
  Filled 2022-11-27 (×2): qty 1
  Filled 2022-11-27: qty 2
  Filled 2022-11-27: qty 1

## 2022-11-27 MED ORDER — ZIPRASIDONE HCL 40 MG PO CAPS: 40.0000 mg | ORAL_CAPSULE | Freq: Every day | ORAL | Status: DC

## 2022-11-27 MED ORDER — DIVALPROEX SODIUM ER 500 MG PO TB24
1000.0000 mg | ORAL_TABLET | Freq: Every day | ORAL | Status: DC
  Administered 2022-11-27 – 2022-11-29 (×3): 1000 mg via ORAL
  Filled 2022-11-27 (×3): qty 2

## 2022-11-27 MED ORDER — SIMVASTATIN 10 MG PO TABS: 40.0000 mg | ORAL_TABLET | Freq: Every day | ORAL | Status: DC

## 2022-11-27 MED ORDER — ONDANSETRON HCL 4 MG PO TABS: 4.0000 mg | ORAL_TABLET | Freq: Four times a day (QID) | ORAL | Status: DC | PRN

## 2022-11-27 MED ORDER — GUAIFENESIN ER 600 MG PO TB12
600.0000 mg | ORAL_TABLET | Freq: Two times a day (BID) | ORAL | Status: DC
  Administered 2022-11-27 – 2022-11-30 (×8): 600 mg via ORAL
  Filled 2022-11-27 (×8): qty 1

## 2022-11-27 MED ORDER — ONDANSETRON HCL 4 MG/2ML IJ SOLN: 4.0000 mg | Freq: Four times a day (QID) | INTRAMUSCULAR | Status: DC | PRN

## 2022-11-27 MED ORDER — ATORVASTATIN CALCIUM 80 MG PO TABS
80.0000 mg | ORAL_TABLET | Freq: Every day | ORAL | Status: DC
  Administered 2022-11-27 – 2022-11-30 (×4): 80 mg via ORAL
  Filled 2022-11-27: qty 1
  Filled 2022-11-27: qty 4
  Filled 2022-11-27 (×2): qty 1

## 2022-11-27 MED ORDER — IPRATROPIUM-ALBUTEROL 0.5-2.5 (3) MG/3ML IN SOLN
3.0000 mL | Freq: Four times a day (QID) | RESPIRATORY_TRACT | Status: DC
  Administered 2022-11-27 – 2022-11-28 (×5): 3 mL via RESPIRATORY_TRACT
  Filled 2022-11-27 (×5): qty 3

## 2022-11-27 MED ORDER — ACETAMINOPHEN 325 MG PO TABS: 650.0000 mg | ORAL_TABLET | Freq: Four times a day (QID) | ORAL | Status: DC | PRN

## 2022-11-27 MED ORDER — METOPROLOL TARTRATE 25 MG PO TABS
12.5000 mg | ORAL_TABLET | Freq: Two times a day (BID) | ORAL | Status: DC
  Administered 2022-11-27 – 2022-11-28 (×2): 12.5 mg via ORAL
  Filled 2022-11-27 (×3): qty 1

## 2022-11-27 MED ORDER — ZIPRASIDONE HCL 40 MG PO CAPS
40.0000 mg | ORAL_CAPSULE | Freq: Every day | ORAL | Status: DC
  Administered 2022-11-27 – 2022-11-29 (×4): 40 mg via ORAL
  Filled 2022-11-27 (×5): qty 1

## 2022-11-27 MED ORDER — ACETAMINOPHEN 650 MG RE SUPP: 650.0000 mg | Freq: Four times a day (QID) | RECTAL | Status: DC | PRN

## 2022-11-27 MED ORDER — SODIUM CHLORIDE 0.9 % IV SOLN
2.0000 g | INTRAVENOUS | Status: DC
  Administered 2022-11-27 – 2022-11-29 (×3): 2 g via INTRAVENOUS
  Filled 2022-11-27 (×3): qty 20

## 2022-11-27 NOTE — Assessment & Plan Note (Signed)
-   We will continue statin therapy. 

## 2022-11-27 NOTE — ED Provider Notes (Signed)
Here for dyspnea on exertion, generalized weakness, found to have troponin in the 300s, evidence of pneumonia on CT scan.  No chest pain reported, more likely demand ischemia from pneumonia in the setting of shortness of breath.  Antibiotics, aspirin, heparin, admission.   Pilar Jarvis, MD 11/27/22 [phone]

## 2022-11-27 NOTE — H&P (Signed)
[place]   PATIENT NAME: [name]    MR#:  [id number]  DATE OF BIRTH:  1945/08/30  DATE OF ADMISSION:  11/26/2022  PRIMARY CARE PHYSICIAN: Malva Limes, MD   Patient is coming from: Home  REQUESTING/REFERRING PHYSICIAN: Pilar Jarvis, MD  CHIEF COMPLAINT:   Chief Complaint  Patient presents with   Weakness    HISTORY OF PRESENT ILLNESS:  [name] is a 77 y.o. male with medical history significant for bipolar disorder, BPH, depression, GERD, OCD, dyslipidemia and osteoarthritis, who presented to the emergency room with acute onset of generalized weakness with inability to ambulate as well as worsening dyspnea for the last week with cough productive of whitish sputum without significant wheezing.  He admitted to chest pain only with cough.  No fever or chills.  No nausea or vomiting or abdominal pain.  No dysuria, oliguria or hematuria or flank pain.  No headache or dizziness or blurred vision.  ED Course: When the patient came to the ER, BP was 142/82 with otherwise normal vital signs.  Labs revealed BUN of 26 with otherwise normal BMP.  High sensitive troponin I will was 301 and later to 99.  CBC showed WBCs of 10.8 with neutrophilia and anemia at baseline with macrocytosis.  Sed rate was 41. EKG as reviewed by me : EKG shows normal sinus rhythm with rate of 76. Imaging: Noncontrast head CT scan and chest CTA revealed the following: 1. No acute intracranial CT findings or interval changes. Atrophy and small-vessel disease. 2. New patchy airspace disease in the posterior basal bilateral lower lobes compatible with pneumonia, with additional new focal consolidation medially in the left lower lobe superior segment. Increased bronchial thickening in both lower lobes. 3. Emphysema, postsurgical and scarring changes. 4. Stable chronic nodular appearance around surgical clips in the left upper lobe. 5. Stable chronic 6 mm right middle lobe nodule. 6. Aortic  and coronary artery atherosclerosis. 7. Small hiatal hernia with increased thickening of the distal thoracic esophagus suggesting esophagitis, above a prior fundoplication. 8. Paucity of body fat which may suggest cachexia. 9. Osteopenia and degenerative change. 10.  Aortic Atherosclerosis (ICD10-I70.0) and Emphysema (ICD10-J43.9).  The patient was given IV Rocephin and Zithromax, 4 baby aspirin and was started on IV heparin.  He will be admitted to a unit bed for further evaluation and management. PAST MEDICAL HISTORY:   Past Medical History:  Diagnosis Date   Arthritis    in his fingers   Bipolar disorder (HCC)    BPH (benign prostatic hyperplasia)    Cancer (HCC) 2020   LUNG CA   Depression    Emphysema    Episodic confusion    Possible bipolar disorder   GERD (gastroesophageal reflux disease)    History of chicken pox    History of hiatal hernia    HNP (herniated nucleus pulposus), lumbar    Hyperlipidemia    Memory deficit 07/31/2012   OCD (obsessive compulsive disorder)    Peripheral neuropathy    Restless leg syndrome 01/10/2003   Unspecified psychosis 03/01/2012   Wears dentures    full upper and lower   Wears hearing aid in both ears     PAST SURGICAL HISTORY:   Past Surgical History:  Procedure Laterality Date   BACK SURGERY     COLONOSCOPY     ESOPHAGOGASTRODUODENOSCOPY     ESOPHAGOGASTRODUODENOSCOPY (EGD) WITH PROPOFOL [name] 02/28/2019   Procedure: ESOPHAGOGASTRODUODENOSCOPY (EGD) WITH DILATION;  Surgeon: Midge Minium, MD;  Location: [facility];  Service: Endoscopy;  Laterality: [name];  Priority 3   FLEXIBLE BRONCHOSCOPY [name] 01/06/2019   Procedure: FLEXIBLE BRONCHOSCOPY;  Surgeon: Hulda Marin, MD;  Location: [facility];  Service: Thoracic;  Laterality: [name];   liver excision     for non-cancerous mass   LUMBAR LAMINECTOMY/DECOMPRESSION MICRODISCECTOMY Left 06/22/2017   Procedure: Extraforaminal Microdiscectomy  - L4-L5 - left;  Surgeon: Tia Alert,  MD;  Location: [facility] OR;  Service: Neurosurgery;  Laterality: Left;   LUNG SURGERY  2005   Lipoma Removal   MAXIMUM ACCESS (MAS) TRANSFORAMINAL LUMBAR INTERBODY FUSION (TLIF) 1 LEVEL [name] 02/18/2018   Procedure: Transforaminal Lumbar Interbody Fusion - Lumbar Four - Lumbar Five;  Surgeon: Tia Alert, MD;  Location: [facility] OR;  Service: Neurosurgery;  Laterality: [name];  Transforaminal Lumbar Interbody Fusion - Lumbar Four - Lumbar Five   THORACOTOMY Right 01/06/2019   Procedure: THORACOTOMY MAJOR;  Surgeon: Hulda Marin, MD;  Location: [facility];  Service: Thoracic;  Laterality: Right;   VIDEO ASSISTED THORACOSCOPY (VATS)/WEDGE RESECTION Right 01/06/2019   Procedure: VIDEO ASSISTED THORACOSCOPY (VATS)/WEDGE RESECTION-RUL and RLL-lung resection;  Surgeon: Hulda Marin, MD;  Location: [facility];  Service: Thoracic;  Laterality: Right;   XI ROBOTIC ASSISTED PARAESOPHAGEAL HERNIA REPAIR [name] 05/06/2019   Procedure: XI ROBOTIC ASSISTED PARAESOPHAGEAL HERNIA REPAIR;  Surgeon: Leafy Ro, MD;  Location: [facility];  Service: General;  Laterality: [name];    SOCIAL HISTORY:   Social History   Tobacco Use   Smoking status: Former    Current packs/day: 0.00    Average packs/day: 0.8 packs/day for 50.0 years (37.5 ttl pk-yrs)    Types: Cigarettes    Start date: 11/25/1968    Quit date: 11/26/2018    Years since quitting: 4.0   Smokeless tobacco: Never  Substance Use Topics   Alcohol use: No    FAMILY HISTORY:   Family History  Problem Relation Age of Onset   Heart disease Mother    Cancer Father        lung   Heart disease Father    Diabetes Sister    Heart disease Sister    Seizures Paternal Grandfather     DRUG ALLERGIES:   Allergies  Allergen Reactions   Ropinirole Hcl Other (See Comments)    Confusion, agiitation, disorientation, Hallucinations   Tramadol     lethargy    REVIEW OF SYSTEMS:   ROS As per history of present illness. All pertinent systems were reviewed above.  Constitutional, HEENT, cardiovascular, respiratory, GI, GU, musculoskeletal, neuro, psychiatric, endocrine, integumentary and hematologic systems were reviewed and are otherwise negative/unremarkable except for positive findings mentioned above in the HPI.   MEDICATIONS AT HOME:   Prior to Admission medications   Medication Sig Start Date End Date Taking? Authorizing Provider  aspirin 81 MG tablet Take 1 tablet (81 mg total) by mouth daily. 08/06/17  Yes Malva Limes, MD  cholecalciferol (VITAMIN D3) 25 MCG (1000 UT) tablet Take 1,000 Units by mouth daily.   Yes [provider]  Coenzyme Q10 (COQ10) 100 MG CAPS Take 100 mg by mouth daily.    Yes [provider]  divalproex (DEPAKOTE ER) 500 MG 24 hr tablet Take 1,000 mg by mouth at bedtime.    Yes [provider]  omeprazole (PRILOSEC) 40 MG capsule TAKE 1 CAPSULE BY MOUTH EVERY DAY 04/27/22  Yes Malva Limes, MD  simvastatin (ZOCOR) 40 MG tablet TAKE 1 TABLET BY MOUTH EVERY DAY AT [facility]  04/27/22  Yes Malva Limes, MD  umeclidinium bromide (INCRUSE ELLIPTA) 62.5 MCG/ACT AEPB Inhale 1 puff into the lungs daily. 11/08/22  Yes Malva Limes, MD  vitamin B-12 (CYANOCOBALAMIN) 1000 MCG tablet Take 1,000 mcg by mouth daily.   Yes [provider]  ziprasidone (GEODON) 40 MG capsule Take 40 mg by mouth daily with supper.    Yes [provider]      VITAL SIGNS:  Blood pressure 125/74, pulse 71, temperature 98.2 F (36.8 C), temperature source Oral, resp. rate 18, height 5\' 10"  (1.778 m), weight 61.2 kg, SpO2 100%.  PHYSICAL EXAMINATION:  Physical Exam  GENERAL:  77 y.o.-year-old Caucasian male patient lying in the bed with no acute distress.  EYES: Pupils equal, round, reactive to light and accommodation. No scleral icterus. Extraocular muscles intact.  HEENT: Head atraumatic, normocephalic. Oropharynx and nasopharynx clear.  NECK:  Supple, no jugular venous distention. No thyroid  enlargement, no tenderness.  LUNGS: Diminished bibasal breath sounds with bibasal crackles.  No use of accessory muscles of respiration.  CARDIOVASCULAR: Regular rate and rhythm, S1, S2 normal. No murmurs, rubs, or gallops.  ABDOMEN: Soft, nondistended, nontender. Bowel sounds present. No organomegaly or mass.  EXTREMITIES: No pedal edema, cyanosis, or clubbing.  NEUROLOGIC: Cranial nerves II through XII are intact. Muscle strength 5/5 in all extremities. Sensation intact. Gait not checked.  PSYCHIATRIC: The patient is alert and oriented x 3.  Normal affect and good eye contact. SKIN: No obvious rash, lesion, or ulcer.   LABORATORY PANEL:   CBC Recent Labs  Lab 11/26/22 1520  WBC 10.8*  HGB 12.2*  HCT 37.9*  PLT 197   ------------------------------------------------------------------------------------------------------------------  Chemistries  Recent Labs  Lab 11/26/22 1520  NA 141  K 4.3  CL 107  CO2 27  GLUCOSE 124*  BUN 26*  CREATININE 1.05  CALCIUM 9.4   ------------------------------------------------------------------------------------------------------------------  Cardiac Enzymes No results for input(s): "TROPONINI" in the last 168 hours. ------------------------------------------------------------------------------------------------------------------  RADIOLOGY:  CT Head Wo Contrast  Result Date: 11/26/2022 CLINICAL DATA:  Two month history of shortness of breath. X-rays with a possible posterior basal infiltrate. There is a history of lung cancer. There is progressive worsening of weakness x6 months with mental status changes. EXAM: CT HEAD WITHOUT CONTRAST CT CHEST WITHOUT CONTRAST TECHNIQUE: Contiguous axial images were obtained from the base of the skull through the vertex without intravenous contrast. Multidetector CT imaging of the chest and upper abdomen was performed following the standard protocol without IV contrast. RADIATION DOSE REDUCTION: This exam  was performed according to the departmental dose-optimization program which includes automated exposure control, adjustment of the mA and/or kV according to patient size and/or use of iterative reconstruction technique. COMPARISON:  Head CT 01/06/2021, chest CT with contrast 06/02/2022, chest CT without contrast 01/30/2022, and PA and lateral chest today. FINDINGS: CT HEAD FINDINGS Brain: There is moderately advanced cerebral and mild cerebellar parenchymal atrophy with moderate to severe small vessel disease of the cerebral white matter. There are tiny chronic gangliocapsular lacunar infarcts. Dystrophic calcifications are present in the frontal falx. No new asymmetry is seen worrisome for an acute cortical based infarct, hemorrhage or mass and there is no midline shift. Vascular: There are calcific plaques in both siphons both distal vertebral arteries. No hyperdense central vessel is seen. Skull: Negative for fractures or focal lesions. Sinuses/Orbits: There is moderate membrane disease in the left sphenoid air cell, mild scattered disease in the ethmoids. Other paranasal sinuses are clear. There is new trace  fluid the right mastoid tip. The mastoid air cells are otherwise clear. Old lens replacements are again shown with otherwise negative orbits. Other: None. CT CHEST FINDINGS Cardiovascular: There is atherosclerosis in the great vessels and aorta. The coronary arteries are heavily calcified. The aorta is tortuous without aneurysm. The cardiac size is normal. There is no pericardial effusion. The pulmonary arteries and veins are normal caliber. Mediastinum/Nodes: Limited assessment for hilar adenopathy without contrast but no new contour deforming abnormality is suspected. No mediastinal or axillary adenopathy is seen. Thyroid gland is unremarkable. Interval increased thickening of the distal thoracic esophagus suggesting esophagitis with evidence of prior fundoplication once again. The upper thoracic  esophagus, the thoracic trachea, both main bronchi are unremarkable. Lungs/Pleura: Mild-to-moderate centrilobular emphysema is again noted with additional paraseptal emphysematous change in the upper lobes. There are postsurgical changes and scarring from remote posterior wedge resections in the right upper and lower lobes. Calcifications versus postsurgical changes are again seen in the anterior aspect of the left upper lobe on 4:56-58. Irregular chronic nodular appearance around left upper lobe surgical clips measures similar to the prior CTs, on 4:49 measuring 1.5 x 1.2 cm with pleural-parenchymal stranding lateral to it. Stable chronic 6 mm right middle lobe nodule is again noted on 4:106 with adjacent linear scar-like opacity. There is new patchy airspace disease in the posterior basal bilateral lower lobes compatible with pneumonia, additional new focal consolidation medially in the left lower lobe superior segment. Increased mild bronchial thickening in both lower lobes. Additional chronic scattered linear scar-like opacities are again seen in both bases as well as post pneumonic subpleural cystic scarring anteriorly in the left lower lobe and in the dorsal lingular base. Rest of the lungs are generally clear. There is trace right pleural fluid. No left pleural effusion or pneumothorax. Upper abdomen: No acute abnormality. Abdominal aortic atherosclerosis. Small hiatal hernia. Musculoskeletal: Osteopenia, thoracic kyphosis and degenerative change. No acute or other significant osseous findings. Paucity of body fat may suggest cachexia. Subcutaneous fat stores are slightly thinner than previously. IMPRESSION: 1. No acute intracranial CT findings or interval changes. Atrophy and small-vessel disease. 2. New patchy airspace disease in the posterior basal bilateral lower lobes compatible with pneumonia, with additional new focal consolidation medially in the left lower lobe superior segment. Increased bronchial  thickening in both lower lobes. 3. Emphysema, postsurgical and scarring changes. 4. Stable chronic nodular appearance around surgical clips in the left upper lobe. 5. Stable chronic 6 mm right middle lobe nodule. 6. Aortic and coronary artery atherosclerosis. 7. Small hiatal hernia with increased thickening of the distal thoracic esophagus suggesting esophagitis, above a prior fundoplication. 8. Paucity of body fat which may suggest cachexia. 9. Osteopenia and degenerative change. Aortic Atherosclerosis (ICD10-I70.0) and Emphysema (ICD10-J43.9). Electronically Signed   By: Almira Bar M.D.   On: 11/26/2022 23:20   CT Chest Wo Contrast  Result Date: 11/26/2022 CLINICAL DATA:  Two month history of shortness of breath. X-rays with a possible posterior basal infiltrate. There is a history of lung cancer. There is progressive worsening of weakness x6 months with mental status changes. EXAM: CT HEAD WITHOUT CONTRAST CT CHEST WITHOUT CONTRAST TECHNIQUE: Contiguous axial images were obtained from the base of the skull through the vertex without intravenous contrast. Multidetector CT imaging of the chest and upper abdomen was performed following the standard protocol without IV contrast. RADIATION DOSE REDUCTION: This exam was performed according to the departmental dose-optimization program which includes automated exposure control, adjustment of the mA and/or  kV according to patient size and/or use of iterative reconstruction technique. COMPARISON:  Head CT 01/06/2021, chest CT with contrast 06/02/2022, chest CT without contrast 01/30/2022, and PA and lateral chest today. FINDINGS: CT HEAD FINDINGS Brain: There is moderately advanced cerebral and mild cerebellar parenchymal atrophy with moderate to severe small vessel disease of the cerebral white matter. There are tiny chronic gangliocapsular lacunar infarcts. Dystrophic calcifications are present in the frontal falx. No new asymmetry is seen worrisome for an  acute cortical based infarct, hemorrhage or mass and there is no midline shift. Vascular: There are calcific plaques in both siphons both distal vertebral arteries. No hyperdense central vessel is seen. Skull: Negative for fractures or focal lesions. Sinuses/Orbits: There is moderate membrane disease in the left sphenoid air cell, mild scattered disease in the ethmoids. Other paranasal sinuses are clear. There is new trace fluid the right mastoid tip. The mastoid air cells are otherwise clear. Old lens replacements are again shown with otherwise negative orbits. Other: None. CT CHEST FINDINGS Cardiovascular: There is atherosclerosis in the great vessels and aorta. The coronary arteries are heavily calcified. The aorta is tortuous without aneurysm. The cardiac size is normal. There is no pericardial effusion. The pulmonary arteries and veins are normal caliber. Mediastinum/Nodes: Limited assessment for hilar adenopathy without contrast but no new contour deforming abnormality is suspected. No mediastinal or axillary adenopathy is seen. Thyroid gland is unremarkable. Interval increased thickening of the distal thoracic esophagus suggesting esophagitis with evidence of prior fundoplication once again. The upper thoracic esophagus, the thoracic trachea, both main bronchi are unremarkable. Lungs/Pleura: Mild-to-moderate centrilobular emphysema is again noted with additional paraseptal emphysematous change in the upper lobes. There are postsurgical changes and scarring from remote posterior wedge resections in the right upper and lower lobes. Calcifications versus postsurgical changes are again seen in the anterior aspect of the left upper lobe on 4:56-58. Irregular chronic nodular appearance around left upper lobe surgical clips measures similar to the prior CTs, on 4:49 measuring 1.5 x 1.2 cm with pleural-parenchymal stranding lateral to it. Stable chronic 6 mm right middle lobe nodule is again noted on 4:106 with  adjacent linear scar-like opacity. There is new patchy airspace disease in the posterior basal bilateral lower lobes compatible with pneumonia, additional new focal consolidation medially in the left lower lobe superior segment. Increased mild bronchial thickening in both lower lobes. Additional chronic scattered linear scar-like opacities are again seen in both bases as well as post pneumonic subpleural cystic scarring anteriorly in the left lower lobe and in the dorsal lingular base. Rest of the lungs are generally clear. There is trace right pleural fluid. No left pleural effusion or pneumothorax. Upper abdomen: No acute abnormality. Abdominal aortic atherosclerosis. Small hiatal hernia. Musculoskeletal: Osteopenia, thoracic kyphosis and degenerative change. No acute or other significant osseous findings. Paucity of body fat may suggest cachexia. Subcutaneous fat stores are slightly thinner than previously. IMPRESSION: 1. No acute intracranial CT findings or interval changes. Atrophy and small-vessel disease. 2. New patchy airspace disease in the posterior basal bilateral lower lobes compatible with pneumonia, with additional new focal consolidation medially in the left lower lobe superior segment. Increased bronchial thickening in both lower lobes. 3. Emphysema, postsurgical and scarring changes. 4. Stable chronic nodular appearance around surgical clips in the left upper lobe. 5. Stable chronic 6 mm right middle lobe nodule. 6. Aortic and coronary artery atherosclerosis. 7. Small hiatal hernia with increased thickening of the distal thoracic esophagus suggesting esophagitis, above a prior fundoplication. 8.  Paucity of body fat which may suggest cachexia. 9. Osteopenia and degenerative change. Aortic Atherosclerosis (ICD10-I70.0) and Emphysema (ICD10-J43.9). Electronically Signed   By: Almira Bar M.D.   On: 11/26/2022 23:20   DG Chest 2 View  Result Date: 11/26/2022 CLINICAL DATA:  Two-month history of  shortness of breath EXAM: CHEST - 2 VIEW COMPARISON:  CT chest dated 06/02/2022 FINDINGS: Hyperinflated lungs. Patchy opacity in the posterior lung base. No pleural effusion or pneumothorax. The heart size and mediastinal contours are within normal limits. No acute osseous abnormality. Surgical clips project over the left hilum. Right apical surgical sutures. IMPRESSION: 1. Patchy opacity in the posterior lung base, which may represent atelectasis or infection. 2. Hyperinflated lungs, which can be seen in the setting of COPD. Electronically Signed   By: Agustin Cree M.D.   On: 11/26/2022 15:51      IMPRESSION AND PLAN:  Assessment and Plan: * NSTEMI (non-ST elevated myocardial infarction) [facility]) - The patient was admitted to a progressive unit bed. - We will continue him on IV heparin. - The patient will be placed on aspirin as well as high-dose statin therapy. - 2D echo and cardiology consult to be obtained. - [facility] group was notified about the patient.  CAP (community acquired pneumonia) - Will continue antibiotic therapy with IV Rocephin and Zithromax. - Mucolytic therapy be provided as well as duo nebs q.i.d. and q.4 hours p.r.n. - We will follow blood cultures.   Dyslipidemia - We will continue statin therapy.  Chronic obstructive pulmonary disease (COPD) (HCC) - The patient will be on scheduled and as needed DuoNebs. - We will continue Incruse Ellipta.  GERD without esophagitis - We will continue PPI therapy.   DVT prophylaxis: Lovenox.  Advanced Care Planning:  Code Status: The patient is DNR only. Family Communication:  The plan of care was discussed in details with the patient (and family). I answered all questions. The patient agreed to proceed with the above mentioned plan. Further management will depend upon hospital course. Disposition Plan: Back to previous home environment Consults called: Cardiology All the records are reviewed and case discussed with ED  provider.  Status is: Inpatient  At the time of the admission, it appears that the appropriate admission status for this patient is inpatient.  This is judged to be reasonable and necessary in order to provide the required intensity of service to ensure the patient's safety given the presenting symptoms, physical exam findings and initial radiographic and laboratory data in the context of comorbid conditions.  The patient requires inpatient status due to high intensity of service, high risk of further deterioration and high frequency of surveillance required.  I certify that at the time of admission, it is my clinical judgment that the patient will require inpatient hospital care extending more than 2 midnights.                            Dispo: The patient is from: Home              Anticipated d/c is to: Home              Patient currently is not medically stable to d/c.              Difficult to place patient: No  Hannah Beat M.D on 11/27/2022 at 3:37 AM  [facility]   From 7 PM-7 AM, contact night-coverage [email or web address]  CC: Primary care physician;  Malva Limes, MD

## 2022-11-27 NOTE — ED Notes (Addendum)
Dr Clide Dales notified of pt SBP 90s with MAP >65. Per Dr Clide Dales continue to hold metoprolol

## 2022-11-27 NOTE — Assessment & Plan Note (Signed)
-   The patient was admitted to a progressive unit bed. - We will continue him on IV heparin. - The patient will be placed on aspirin as well as high-dose statin therapy. - 2D echo and cardiology consult to be obtained. - [facility] group was notified about the patient.

## 2022-11-27 NOTE — Progress Notes (Addendum)
ANTICOAGULATION CONSULT NOTE  Pharmacy Consult for heparin infusion Indication: ACS/STEMI  Allergies  Allergen Reactions   Ropinirole Hcl Other (See Comments)    Confusion, agiitation, disorientation, Hallucinations   Tramadol     lethargy   Patient Measurements: Height: 5\' 10"  (177.8 cm) Weight: 61.2 kg (135 lb) IBW/kg (Calculated) : 73 Heparin Dosing Weight: 61.2 kg  Vital Signs: Temp: 98.3 F (36.8 C) (11/18 0527) Temp Source: Oral (11/18 0527) BP: 92/58 (11/18 1001) Pulse Rate: 69 (11/18 1045)  Labs: Recent Labs    11/26/22 1520 11/27/22 0005 11/27/22 0010 11/27/22 0620 11/27/22 1015  HGB 12.2*  --   --  9.6*  --   HCT 37.9*  --   --  29.3*  --   PLT 197  --   --  130*  --   APTT  --   --  30  --   --   LABPROT  --   --  14.4  --   --   INR  --   --  1.1  --   --   HEPARINUNFRC  --   --   --   --  0.19*  CREATININE 1.05  --   --  0.89  --   CKTOTAL 60  --   --   --   --   TROPONINIHS 301* 299*  --  293*  --    Estimated Creatinine Clearance: 60.2 mL/min (by C-G formula based on SCr of 0.89 mg/dL).  Medical History: Past Medical History:  Diagnosis Date   Arthritis    in his fingers   Bipolar disorder (HCC)    BPH (benign prostatic hyperplasia)    Cancer (HCC) 2020   LUNG CA   Depression    Emphysema    Episodic confusion    Possible bipolar disorder   GERD (gastroesophageal reflux disease)    History of chicken pox    History of hiatal hernia    HNP (herniated nucleus pulposus), lumbar    Hyperlipidemia    Memory deficit 07/31/2012   OCD (obsessive compulsive disorder)    Peripheral neuropathy    Restless leg syndrome 01/10/2003   Unspecified psychosis 03/01/2012   Wears dentures    full upper and lower   Wears hearing aid in both ears    Assessment: Pt is a 77 yo male presenting to ED c/o progressive worsening of weakness x 6 months and ShOB w/ exertion x 2 months, found with elevated troponin I level.   Goal of Therapy:  Heparin level  0.3-0.7 units/ml Monitor platelets by anticoagulation protocol: Yes  Labs:  11/18 1015 HL = 0.19; subtherapeutic    Plan:  HL subtherapeutic  No bolus per discussion with cardio NP due to drop in hemoglobin (12.2 > 9.6; will recheck at noon)  Increase infusion rate to 1000 units/hr  Recheck HL 8 hours after rate change  Monitor CBC daily while on heparin  Littie Deeds, PharmD Pharmacy Resident  11/27/2022 11:10 AM

## 2022-11-27 NOTE — Assessment & Plan Note (Signed)
-   Will continue antibiotic therapy with IV Rocephin and Zithromax. - Mucolytic therapy be provided as well as duo nebs q.i.d. and q.4 hours p.r.n. - We will follow blood cultures.

## 2022-11-27 NOTE — ED Notes (Signed)
Lunch tray provided. 

## 2022-11-27 NOTE — ED Notes (Signed)
Provided orange juice as requested

## 2022-11-27 NOTE — Progress Notes (Signed)
Patient is seen and examined today morning.  Patient is admitted overnight for evaluation of community-acquired pneumonia, elevated troponin.  Patient is started on IV Rocephin, azithromycin, heparin drip.  Cardiologist consulted.  During my exam he is lying in the bed, complains of weakness of bilateral lower extremities.  Denies chest pain or shortness of breath.  Patient does have cough productive sputum.  Blood pressure has been running low.  400 mL NS bolus started.  Eating fair.  Will continue to monitor blood pressure closely.  Will follow cardiology recommendations.  Continue antibiotics.  PT OT recommendations for disposition.  Further management as per clinical course.

## 2022-11-27 NOTE — Assessment & Plan Note (Signed)
-   We will continue PPI therapy 

## 2022-11-27 NOTE — ED Notes (Signed)
Breakfast tray provided. 

## 2022-11-27 NOTE — Progress Notes (Signed)
ANTICOAGULATION CONSULT NOTE  Pharmacy Consult for heparin infusion Indication: ACS/STEMI  Allergies  Allergen Reactions   Ropinirole Hcl Other (See Comments)    Confusion, agiitation, disorientation, Hallucinations   Tramadol     lethargy    Patient Measurements: Height: 5\' 10"  (177.8 cm) Weight: 61.2 kg (135 lb) IBW/kg (Calculated) : 73 Heparin Dosing Weight: 61.2 kg  Vital Signs: Temp: 98.4 F (36.9 C) (11/17 2122) Temp Source: Oral (11/17 2122) BP: 139/77 (11/17 2122) Pulse Rate: 74 (11/17 2122)  Labs: Recent Labs    11/26/22 1520  HGB 12.2*  HCT 37.9*  PLT 197  CREATININE 1.05  CKTOTAL 60  TROPONINIHS 301*    Estimated Creatinine Clearance: 51 mL/min (by C-G formula based on SCr of 1.05 mg/dL).   Medical History: Past Medical History:  Diagnosis Date   Arthritis    in his fingers   Bipolar disorder (HCC)    BPH (benign prostatic hyperplasia)    Cancer (HCC) 2020   LUNG CA   Depression    Emphysema    Episodic confusion    Possible bipolar disorder   GERD (gastroesophageal reflux disease)    History of chicken pox    History of hiatal hernia    HNP (herniated nucleus pulposus), lumbar    Hyperlipidemia    Memory deficit 07/31/2012   OCD (obsessive compulsive disorder)    Peripheral neuropathy    Restless leg syndrome 01/10/2003   Unspecified psychosis 03/01/2012   Wears dentures    full upper and lower   Wears hearing aid in both ears     Assessment: Pt is a 77 yo male presenting to ED c/o progressive worsening of weakness x 6 months and ShOB w/ exertion x 2 months, found with elevated troponin I level.   Goal of Therapy:  Heparin level 0.3-0.7 units/ml Monitor platelets by anticoagulation protocol: Yes   Plan:  Bolus 3700 units x 1 Start heparin infusion at 800 units/hr Will check HL in 8 hr after start of infusion CBC daily while on heparin  Otelia Sergeant, PharmD, [facility] 11/27/2022 12:15 AM

## 2022-11-27 NOTE — ED Notes (Signed)
Pt given dinner tray.

## 2022-11-27 NOTE — ED Notes (Signed)
Pt declines having dark or tarry stools. Denies need for BM at this time

## 2022-11-27 NOTE — Consult Note (Signed)
Cardiology Consult    Patient ID: [name] MRN: [id number], DOB/AGE: 1945/10/22   Admit date: 11/26/2022 Date of Consult: 11/27/2022  Primary Physician: Kevin Limes, MD Primary Cardiologist: Kevin Bears, MD Requesting Provider: Andrez Grime, MD  Patient Profile    [name] is a 77 y.o. male with a history of bipolar d/o, BPH, depression, GERD, OCD, HL, lung cancer s/p wedge resection, and OA, who is being seen today for the evaluation of troponin elevation at the request of Dr. Arville Care.  Past Medical History  Subjective  Past Medical History:  Diagnosis Date   Arthritis    in his fingers   Bipolar disorder (HCC)    BPH (benign prostatic hyperplasia)    Cancer (HCC) 2020   LUNG CA   Depression    Emphysema    Episodic confusion    Possible bipolar disorder   GERD (gastroesophageal reflux disease)    History of chicken pox    History of hiatal hernia    HNP (herniated nucleus pulposus), lumbar    Hyperlipidemia    Memory deficit 07/31/2012   OCD (obsessive compulsive disorder)    Peripheral neuropathy    Restless leg syndrome 01/10/2003   Unspecified psychosis 03/01/2012   Wears dentures    full upper and lower   Wears hearing aid in both ears     Past Surgical History:  Procedure Laterality Date   BACK SURGERY     COLONOSCOPY     ESOPHAGOGASTRODUODENOSCOPY     ESOPHAGOGASTRODUODENOSCOPY (EGD) WITH PROPOFOL [name] 02/28/2019   Procedure: ESOPHAGOGASTRODUODENOSCOPY (EGD) WITH DILATION;  Surgeon: Kevin Minium, MD;  Location: [facility];  Service: Endoscopy;  Laterality: [name];  Priority 3   FLEXIBLE BRONCHOSCOPY [name] 01/06/2019   Procedure: FLEXIBLE BRONCHOSCOPY;  Surgeon: Kevin Marin, MD;  Location: [facility];  Service: Thoracic;  Laterality: [name];   liver excision     for non-cancerous mass   LUMBAR LAMINECTOMY/DECOMPRESSION MICRODISCECTOMY Left 06/22/2017   Procedure: Extraforaminal Microdiscectomy  - L4-L5 - left;  Surgeon: Kevin Alert,  MD;  Location: [facility] OR;  Service: Neurosurgery;  Laterality: Left;   LUNG SURGERY  2005   Lipoma Removal   MAXIMUM ACCESS (MAS) TRANSFORAMINAL LUMBAR INTERBODY FUSION (TLIF) 1 LEVEL [name] 02/18/2018   Procedure: Transforaminal Lumbar Interbody Fusion - Lumbar Four - Lumbar Five;  Surgeon: Kevin Alert, MD;  Location: [facility] OR;  Service: Neurosurgery;  Laterality: [name];  Transforaminal Lumbar Interbody Fusion - Lumbar Four - Lumbar Five   THORACOTOMY Right 01/06/2019   Procedure: THORACOTOMY MAJOR;  Surgeon: Kevin Marin, MD;  Location: [facility];  Service: Thoracic;  Laterality: Right;   VIDEO ASSISTED THORACOSCOPY (VATS)/WEDGE RESECTION Right 01/06/2019   Procedure: VIDEO ASSISTED THORACOSCOPY (VATS)/WEDGE RESECTION-RUL and RLL-lung resection;  Surgeon: Kevin Marin, MD;  Location: [facility];  Service: Thoracic;  Laterality: Right;   XI ROBOTIC ASSISTED PARAESOPHAGEAL HERNIA REPAIR [name] 05/06/2019   Procedure: XI ROBOTIC ASSISTED PARAESOPHAGEAL HERNIA REPAIR;  Surgeon: Kevin Ro, MD;  Location: [facility];  Service: General;  Laterality: [name];     Allergies  Allergies  Allergen Reactions   Ropinirole Hcl Other (See Comments)    Confusion, agiitation, disorientation, Hallucinations   Tramadol     lethargy      History of Present Illness     77 y/o ? w/ a h/o bipolar d/o, BPH, depression, GERD, OCD, HL, lung cancer s/p wedge resection, and OA.  He has no prior cardiac hx.  He lives locally by  himself.  He is relatively sedentary in the setting of progressive bilateral lower extremity weakness.  He has been intermittently using a walker over the past six mos.  Over the same period of time, he has had intermittently productive cough w/ white sputum and associated pleuritic c/p.  He has also noted chronic but progressive DOE.  In the past 2-3 mos, leg wkns has progressed to the point that he can do very little for himself at home anymore, and his sister has had to help him.  He fell about 2 wks ago but  did not sustain any significant injuries.  With the exception of pleuritic c/p assoc w/ cough, he does not experience c/p.   Due to progrossive leg fatigue, he presented to the ED on 11/17.  Here, he was afebrile and moderately hypertensive @ 142/82.  SpO2 100% on RA.  ECG w/ RSR, 76, w/ baseline artifact.  Labs notable for hsTrops of 301  299  293.  Renal fxn, lytes nl.  H/H 12.2/37.9, though dropped to 9.6/29.3 this AM.  CT head w/o acute intracranial findings.  CT chest w/ patchy airspace dzs in post basal bilat lower lobes  PNA, and new focal consolidation medially in LLL superior segment.  Ao and cor atherosclerosis, chronic 6mm RML nodule, small HH, osteopenia, and ? Of cachexia noted.  He was given ASA, heparin, azithromycin, ceftriaxone.  Currently he denies c/p or dyspnea.  He denies any h/o BRPBR or melena.  Inpatient Medications  Subjective    aspirin EC  81 mg Oral Daily   atorvastatin  80 mg Oral Daily   cyanocobalamin  1,000 mcg Oral Daily   divalproex  1,000 mg Oral QHS   guaiFENesin  600 mg Oral BID   ipratropium-albuterol  3 mL Nebulization QID   metoprolol tartrate  12.5 mg Oral BID   nicotine  14 mg Transdermal Daily   pantoprazole  40 mg Oral Daily   ziprasidone  40 mg Oral Q supper    Family History    Family History  Problem Relation Age of Onset   Heart disease Mother    Cancer Father        lung   Heart disease Father    Diabetes Sister    Heart disease Sister    Seizures Paternal Grandfather    He indicated that his mother is deceased. He indicated that his father is deceased. He indicated that all of his three sisters are alive. He indicated that the status of his paternal grandfather is unknown. He indicated that his other is alive.   Social History    Social History   Socioeconomic History   Marital status: Divorced    Spouse name: Not on file   Number of children: 0   Years of education: 11   Highest education level: Not on file  Occupational  History   Occupation: Retired  Tobacco Use   Smoking status: Former    Current packs/day: 0.00    Average packs/day: 0.8 packs/day for 50.0 years (37.5 ttl pk-yrs)    Types: Cigarettes    Start date: 11/25/1968    Quit date: 11/26/2018    Years since quitting: 4.0   Smokeless tobacco: Never  Vaping Use   Vaping status: Never Used  Substance and Sexual Activity   Alcohol use: No   Drug use: No   Sexual activity: Not on file  Other Topics Concern   Not on file  Social History Narrative   Patient lives  at home alone.   Patient is retired.    Patient has no children.    Patient has 11th grade education.    Social Determinants of Health   Financial Resource Strain: Patient Declined (11/02/2022)   Overall Financial Resource Strain (CARDIA)    Difficulty of Paying Living Expenses: Patient declined  Food Insecurity: Patient Declined (11/27/2022)   Hunger Vital Sign    Worried About Running Out of Food in the Last Year: Patient declined    Ran Out of Food in the Last Year: Patient declined  Transportation Needs: Patient Declined (11/27/2022)   PRAPARE - Administrator, Civil Service (Medical): Patient declined    Lack of Transportation (Non-Medical): Patient declined  Physical Activity: Not on file  Stress: Not on file  Social Connections: Unknown (11/02/2022)   Social Connection and Isolation Panel [NHANES]    Frequency of Communication with Friends and Family: Patient declined    Frequency of Social Gatherings with Friends and Family: Patient declined    Attends Religious Services: Patient declined    Database administrator or Organizations: Patient declined    Attends Banker Meetings: Not on file    Marital Status: Patient declined  Intimate Partner Violence: Patient Declined (11/27/2022)   Humiliation, Afraid, Rape, and Kick questionnaire    Fear of Current or Ex-Partner: Patient declined    Emotionally Abused: Patient declined    Physically  Abused: Patient declined    Sexually Abused: Patient declined     Review of Systems    General:  No chills, fever, night sweats or weight changes.  Cardiovascular:  +++ pleuritic chest pain w/ cough, +++ progressive dyspnea on exertion, no edema, orthopnea, palpitations, paroxysmal nocturnal dyspnea. Dermatological: No rash, lesions/masses Respiratory: +++ cough, +++ dyspnea Urologic: No hematuria, dysuria Abdominal:   No nausea, vomiting, diarrhea, bright red blood per rectum, melena, or hematemesis Neurologic:  No visual changes, +++ bilat leg wkns, no changes in mental status. All other systems reviewed and are otherwise negative except as noted above.    Objective  Physical Exam    Blood pressure 122/80, pulse (!) 59, temperature 98.3 F (36.8 C), temperature source Oral, resp. rate 18, height 5\' 10"  (1.778 m), weight 61.2 kg, SpO2 100%.  General: Pleasant, NAD.  Thin, frail. Psych: Normal affect. Neuro: [name] and oriented X 3. Moves all extremities spontaneously. HEENT: HOH, otw nl.  Neck: Supple without bruits or JVD. Lungs:  Resp regular and unlabored, scattered rhonchi throughout. Heart: RRR, distant, no s3, s4, or murmurs. Abdomen: Soft, non-tender, non-distended, BS + x 4.  Extremities: No clubbing, cyanosis or edema. DP/PT2+, Radials 2+ and equal bilaterally.  Labs    Cardiac Enzymes Recent Labs  Lab 11/26/22 1520 11/27/22 0005 11/27/22 0620  TROPONINIHS 301* 299* 293*     BNP    Component Value Date/Time   BNP 77.0 11/26/2022 1520   Lab Results  Component Value Date   WBC 7.8 11/27/2022   HGB 9.6 (L) 11/27/2022   HCT 29.3 (L) 11/27/2022   MCV 102.1 (H) 11/27/2022   PLT 130 (L) 11/27/2022    Recent Labs  Lab 11/27/22 0620  NA 142  K 3.9  CL 108  CO2 25  BUN 29*  CREATININE 0.89  CALCIUM 8.5*  GLUCOSE 81   Lab Results  Component Value Date   CHOL 178 06/14/2022   HDL 80 06/14/2022   LDLCALC 83 06/14/2022   TRIG 82 06/14/2022      Radiology  Studies    CT Head Wo Contrast  Result Date: 11/26/2022 CLINICAL DATA:  Two month history of shortness of breath. X-rays with a possible posterior basal infiltrate. There is a history of lung cancer. There is progressive worsening of weakness x6 months with mental status changes. EXAM: CT HEAD WITHOUT CONTRAST CT CHEST WITHOUT CONTRAST TECHNIQUE: Contiguous axial images were obtained from the base of the skull through the vertex without intravenous contrast. Multidetector CT imaging of the chest and upper abdomen was performed following the standard protocol without IV contrast. RADIATION DOSE REDUCTION: This exam was performed according to the departmental dose-optimization program which includes automated exposure control, adjustment of the mA and/or kV according to patient size and/or use of iterative reconstruction technique. COMPARISON:  Head CT 01/06/2021, chest CT with contrast 06/02/2022, chest CT without contrast 01/30/2022, and PA and lateral chest today. FINDINGS: CT HEAD FINDINGS Brain: There is moderately advanced cerebral and mild cerebellar parenchymal atrophy with moderate to severe small vessel disease of the cerebral white matter. There are tiny chronic gangliocapsular lacunar infarcts. Dystrophic calcifications are present in the frontal falx. No new asymmetry is seen worrisome for an acute cortical based infarct, hemorrhage or mass and there is no midline shift. Vascular: There are calcific plaques in both siphons both distal vertebral arteries. No hyperdense central vessel is seen. Skull: Negative for fractures or focal lesions. Sinuses/Orbits: There is moderate membrane disease in the left sphenoid air cell, mild scattered disease in the ethmoids. Other paranasal sinuses are clear. There is new trace fluid the right mastoid tip. The mastoid air cells are otherwise clear. Old lens replacements are again shown with otherwise negative orbits. Other: None. CT CHEST FINDINGS  Cardiovascular: There is atherosclerosis in the great vessels and aorta. The coronary arteries are heavily calcified. The aorta is tortuous without aneurysm. The cardiac size is normal. There is no pericardial effusion. The pulmonary arteries and veins are normal caliber. Mediastinum/Nodes: Limited assessment for hilar adenopathy without contrast but no new contour deforming abnormality is suspected. No mediastinal or axillary adenopathy is seen. Thyroid gland is unremarkable. Interval increased thickening of the distal thoracic esophagus suggesting esophagitis with evidence of prior fundoplication once again. The upper thoracic esophagus, the thoracic trachea, both main bronchi are unremarkable. Lungs/Pleura: Mild-to-moderate centrilobular emphysema is again noted with additional paraseptal emphysematous change in the upper lobes. There are postsurgical changes and scarring from remote posterior wedge resections in the right upper and lower lobes. Calcifications versus postsurgical changes are again seen in the anterior aspect of the left upper lobe on 4:56-58. Irregular chronic nodular appearance around left upper lobe surgical clips measures similar to the prior CTs, on 4:49 measuring 1.5 x 1.2 cm with pleural-parenchymal stranding lateral to it. Stable chronic 6 mm right middle lobe nodule is again noted on 4:106 with adjacent linear scar-like opacity. There is new patchy airspace disease in the posterior basal bilateral lower lobes compatible with pneumonia, additional new focal consolidation medially in the left lower lobe superior segment. Increased mild bronchial thickening in both lower lobes. Additional chronic scattered linear scar-like opacities are again seen in both bases as well as post pneumonic subpleural cystic scarring anteriorly in the left lower lobe and in the dorsal lingular base. Rest of the lungs are generally clear. There is trace right pleural fluid. No left pleural effusion or  pneumothorax. Upper abdomen: No acute abnormality. Abdominal aortic atherosclerosis. Small hiatal hernia. Musculoskeletal: Osteopenia, thoracic kyphosis and degenerative change. No acute or other significant osseous findings. Paucity  of body fat may suggest cachexia. Subcutaneous fat stores are slightly thinner than previously. IMPRESSION: 1. No acute intracranial CT findings or interval changes. Atrophy and small-vessel disease. 2. New patchy airspace disease in the posterior basal bilateral lower lobes compatible with pneumonia, with additional new focal consolidation medially in the left lower lobe superior segment. Increased bronchial thickening in both lower lobes. 3. Emphysema, postsurgical and scarring changes. 4. Stable chronic nodular appearance around surgical clips in the left upper lobe. 5. Stable chronic 6 mm right middle lobe nodule. 6. Aortic and coronary artery atherosclerosis. 7. Small hiatal hernia with increased thickening of the distal thoracic esophagus suggesting esophagitis, above a prior fundoplication. 8. Paucity of body fat which may suggest cachexia. 9. Osteopenia and degenerative change. Aortic Atherosclerosis (ICD10-I70.0) and Emphysema (ICD10-J43.9). Electronically Signed   By: Almira Bar M.D.   On: 11/26/2022 23:20   CT Chest Wo Contrast  Result Date: 11/26/2022 CLINICAL DATA:  Two month history of shortness of breath. X-rays with a possible posterior basal infiltrate. There is a history of lung cancer. There is progressive worsening of weakness x6 months with mental status changes. EXAM: CT HEAD WITHOUT CONTRAST CT CHEST WITHOUT CONTRAST TECHNIQUE: Contiguous axial images were obtained from the base of the skull through the vertex without intravenous contrast. Multidetector CT imaging of the chest and upper abdomen was performed following the standard protocol without IV contrast. RADIATION DOSE REDUCTION: This exam was performed according to the departmental  dose-optimization program which includes automated exposure control, adjustment of the mA and/or kV according to patient size and/or use of iterative reconstruction technique. COMPARISON:  Head CT 01/06/2021, chest CT with contrast 06/02/2022, chest CT without contrast 01/30/2022, and PA and lateral chest today. FINDINGS: CT HEAD FINDINGS Brain: There is moderately advanced cerebral and mild cerebellar parenchymal atrophy with moderate to severe small vessel disease of the cerebral white matter. There are tiny chronic gangliocapsular lacunar infarcts. Dystrophic calcifications are present in the frontal falx. No new asymmetry is seen worrisome for an acute cortical based infarct, hemorrhage or mass and there is no midline shift. Vascular: There are calcific plaques in both siphons both distal vertebral arteries. No hyperdense central vessel is seen. Skull: Negative for fractures or focal lesions. Sinuses/Orbits: There is moderate membrane disease in the left sphenoid air cell, mild scattered disease in the ethmoids. Other paranasal sinuses are clear. There is new trace fluid the right mastoid tip. The mastoid air cells are otherwise clear. Old lens replacements are again shown with otherwise negative orbits. Other: None. CT CHEST FINDINGS Cardiovascular: There is atherosclerosis in the great vessels and aorta. The coronary arteries are heavily calcified. The aorta is tortuous without aneurysm. The cardiac size is normal. There is no pericardial effusion. The pulmonary arteries and veins are normal caliber. Mediastinum/Nodes: Limited assessment for hilar adenopathy without contrast but no new contour deforming abnormality is suspected. No mediastinal or axillary adenopathy is seen. Thyroid gland is unremarkable. Interval increased thickening of the distal thoracic esophagus suggesting esophagitis with evidence of prior fundoplication once again. The upper thoracic esophagus, the thoracic trachea, both main bronchi  are unremarkable. Lungs/Pleura: Mild-to-moderate centrilobular emphysema is again noted with additional paraseptal emphysematous change in the upper lobes. There are postsurgical changes and scarring from remote posterior wedge resections in the right upper and lower lobes. Calcifications versus postsurgical changes are again seen in the anterior aspect of the left upper lobe on 4:56-58. Irregular chronic nodular appearance around left upper lobe surgical clips measures similar to  the prior CTs, on 4:49 measuring 1.5 x 1.2 cm with pleural-parenchymal stranding lateral to it. Stable chronic 6 mm right middle lobe nodule is again noted on 4:106 with adjacent linear scar-like opacity. There is new patchy airspace disease in the posterior basal bilateral lower lobes compatible with pneumonia, additional new focal consolidation medially in the left lower lobe superior segment. Increased mild bronchial thickening in both lower lobes. Additional chronic scattered linear scar-like opacities are again seen in both bases as well as post pneumonic subpleural cystic scarring anteriorly in the left lower lobe and in the dorsal lingular base. Rest of the lungs are generally clear. There is trace right pleural fluid. No left pleural effusion or pneumothorax. Upper abdomen: No acute abnormality. Abdominal aortic atherosclerosis. Small hiatal hernia. Musculoskeletal: Osteopenia, thoracic kyphosis and degenerative change. No acute or other significant osseous findings. Paucity of body fat may suggest cachexia. Subcutaneous fat stores are slightly thinner than previously. IMPRESSION: 1. No acute intracranial CT findings or interval changes. Atrophy and small-vessel disease. 2. New patchy airspace disease in the posterior basal bilateral lower lobes compatible with pneumonia, with additional new focal consolidation medially in the left lower lobe superior segment. Increased bronchial thickening in both lower lobes. 3. Emphysema,  postsurgical and scarring changes. 4. Stable chronic nodular appearance around surgical clips in the left upper lobe. 5. Stable chronic 6 mm right middle lobe nodule. 6. Aortic and coronary artery atherosclerosis. 7. Small hiatal hernia with increased thickening of the distal thoracic esophagus suggesting esophagitis, above a prior fundoplication. 8. Paucity of body fat which may suggest cachexia. 9. Osteopenia and degenerative change. Aortic Atherosclerosis (ICD10-I70.0) and Emphysema (ICD10-J43.9). Electronically Signed   By: Almira Bar M.D.   On: 11/26/2022 23:20   DG Chest 2 View  Result Date: 11/26/2022 CLINICAL DATA:  Two-month history of shortness of breath EXAM: CHEST - 2 VIEW COMPARISON:  CT chest dated 06/02/2022 FINDINGS: Hyperinflated lungs. Patchy opacity in the posterior lung base. No pleural effusion or pneumothorax. The heart size and mediastinal contours are within normal limits. No acute osseous abnormality. Surgical clips project over the left hilum. Right apical surgical sutures. IMPRESSION: 1. Patchy opacity in the posterior lung base, which may represent atelectasis or infection. 2. Hyperinflated lungs, which can be seen in the setting of COPD. Electronically Signed   By: Agustin Cree M.D.   On: 11/26/2022 15:51      ECG & Cardiac Imaging    RSR, 76, baseline artifact - personally reviewed.  Assessment & Plan    1.  Bilateral PNA:  pt presents w/ progressive DOE and 2-3 mos h/o progressive leg wkns/fatigue.  CT chest w/ bilat LL PNA and consolidation.  Abx per medicine team.  2.  NSTEMI:  No prior cardiac hx or h/o chest pain, though he does have chronic DOE, which has progressed over the past six mos.  He can do very little @ home w/o becoming dyspneic.  In setting of above, he was found to have elevated hsTrops of 301  299  293.  No complaints @ rest this AM.  H/H has dropped from 12.2/37.9  9.6/29.3, following initiation of heparin.  He denies BRBPR or melena.  F/u hgb later  this AM.  Cont asa, change simva to atorva as LDL not @ goal.  Adding low dose ? blocker.  F/u echo.  Pending echo and recovery from PNA, will look to pursue ischemic eval.  He is quite frail.  3.  Macrocytic anemia:  As above,  since starting heparin, H/H dropped from 2.2/37.9  9.6/29.3.  No BRBPR or melena.  F/u hgb @ noon.  4.  Bipolar d/o:  per IM.  5.  Cachexia:  lives alone.  ? PO intake.  Will benefit from social work and dietician eval to ensure adequate resources and intake.  Risk Assessment/Risk Scores:     TIMI Risk Score for Unstable Angina or Non-ST Elevation MI:   The patient's TIMI risk score is 2, which indicates a 8% risk of all cause mortality, new or recurrent myocardial infarction or need for urgent revascularization in the next 14 days.      Signed, Nicolasa Ducking, NP 11/27/2022, 9:28 AM  For questions or updates, please contact   Please consult [email or web address] for contact info under Cardiology/STEMI.

## 2022-11-27 NOTE — Progress Notes (Signed)
ANTICOAGULATION CONSULT NOTE  Pharmacy Consult for heparin infusion Indication: ACS/STEMI  Allergies  Allergen Reactions   Ropinirole Hcl Other (See Comments)    Confusion, agiitation, disorientation, Hallucinations   Tramadol     lethargy   Patient Measurements: Height: 5\' 10"  (177.8 cm) Weight: 61.2 kg (135 lb) IBW/kg (Calculated) : 73 Heparin Dosing Weight: 61.2 kg  Vital Signs: Temp: 97.7 F (36.5 C) (11/18 2021) BP: 110/66 (11/18 2021) Pulse Rate: 66 (11/18 2021)  Labs: Recent Labs    11/26/22 1520 11/27/22 0005 11/27/22 0010 11/27/22 0620 11/27/22 1015 11/27/22 1122 11/27/22 2038  HGB 12.2*  --   --  9.6*  --  9.5*  --   HCT 37.9*  --   --  29.3*  --   --   --   PLT 197  --   --  130*  --   --   --   APTT  --   --  30  --   --   --   --   LABPROT  --   --  14.4  --   --   --   --   INR  --   --  1.1  --   --   --   --   HEPARINUNFRC  --   --   --   --  0.19*  --  0.22*  CREATININE 1.05  --   --  0.89  --   --   --   CKTOTAL 60  --   --   --   --   --   --   TROPONINIHS 301* 299*  --  293*  --   --   --    Estimated Creatinine Clearance: 60.2 mL/min (by C-G formula based on SCr of 0.89 mg/dL).  Medical History: Past Medical History:  Diagnosis Date   Arthritis    in his fingers   Bipolar disorder (HCC)    BPH (benign prostatic hyperplasia)    Cancer (HCC) 2020   LUNG CA   Depression    Emphysema    Episodic confusion    Possible bipolar disorder   GERD (gastroesophageal reflux disease)    History of chicken pox    History of hiatal hernia    HNP (herniated nucleus pulposus), lumbar    Hyperlipidemia    Memory deficit 07/31/2012   OCD (obsessive compulsive disorder)    Peripheral neuropathy    Restless leg syndrome 01/10/2003   Unspecified psychosis 03/01/2012   Wears dentures    full upper and lower   Wears hearing aid in both ears    Assessment: Pt is a 77 yo male presenting to ED c/o progressive worsening of weakness x 6 months and ShOB  w/ exertion x 2 months, found with elevated troponin I level.   Goal of Therapy:  Heparin level 0.3-0.7 units/ml Monitor platelets by anticoagulation protocol: Yes  Labs:  11/18 1015 HL = 0.19; subtherapeutic    Plan:  heparin level remains subtherapeutic despite recent rate change ---IV heparin bolus 1000 units x 1 ---Increase infusion rate to 1150 units/hr  ---Recheck heparin level in  8 hours after rate change  ---Monitor CBC daily while on heparin  Lowella Bandy, PharmD 11/27/2022 9:33 PM

## 2022-11-27 NOTE — Assessment & Plan Note (Signed)
-   The patient will be on scheduled and as needed DuoNebs. - We will continue Incruse Ellipta.

## 2022-11-28 DIAGNOSIS — J189 Pneumonia, unspecified organism: Secondary | ICD-10-CM | POA: Diagnosis not present

## 2022-11-28 DIAGNOSIS — C3490 Malignant neoplasm of unspecified part of unspecified bronchus or lung: Secondary | ICD-10-CM

## 2022-11-28 DIAGNOSIS — E43 Unspecified severe protein-calorie malnutrition: Secondary | ICD-10-CM

## 2022-11-28 DIAGNOSIS — J439 Emphysema, unspecified: Secondary | ICD-10-CM | POA: Diagnosis not present

## 2022-11-28 DIAGNOSIS — I214 Non-ST elevation (NSTEMI) myocardial infarction: Secondary | ICD-10-CM | POA: Diagnosis not present

## 2022-11-28 DIAGNOSIS — E785 Hyperlipidemia, unspecified: Secondary | ICD-10-CM | POA: Diagnosis not present

## 2022-11-28 LAB — BASIC METABOLIC PANEL
Anion gap: 4 — ABNORMAL LOW (ref 5–15)
BUN: 24 mg/dL — ABNORMAL HIGH (ref 8–23)
CO2: 27 mmol/L (ref 22–32)
Calcium: 8.3 mg/dL — ABNORMAL LOW (ref 8.9–10.3)
Chloride: 109 mmol/L (ref 98–111)
Creatinine, Ser: 1.04 mg/dL (ref 0.61–1.24)
GFR, Estimated: >60 mL/min (ref >60)
Glucose, Bld: 102 mg/dL — ABNORMAL HIGH (ref 70–99)
Potassium: 3.9 mmol/L (ref 3.5–5.1)
Sodium: 140 mmol/L (ref 135–145)

## 2022-11-28 LAB — CBC
HCT: 28.7 % — ABNORMAL LOW (ref 39.0–52.0)
Hemoglobin: 9.3 g/dL — ABNORMAL LOW (ref 13.0–17.0)
MCH: 33.5 pg (ref 26.0–34.0)
MCHC: 32.4 g/dL (ref 30.0–36.0)
MCV: 103.2 fL — ABNORMAL HIGH (ref 80.0–100.0)
Platelets: 123 10*3/uL — ABNORMAL LOW (ref 150–400)
RBC: 2.78 MIL/uL — ABNORMAL LOW (ref 4.22–5.81)
RDW: 14.9 % (ref 11.5–15.5)
WBC: 7.6 10*3/uL (ref 4.0–10.5)
nRBC: 0 % (ref 0.0–0.2)

## 2022-11-28 LAB — HEPARIN LEVEL (UNFRACTIONATED): Heparin Unfractionated: 0.44 [IU]/mL (ref 0.30–0.70)

## 2022-11-28 LAB — VITAMIN B12: Vitamin B-12: 1170 pg/mL — ABNORMAL HIGH (ref 180–914)

## 2022-11-28 MED ORDER — ENSURE ENLIVE PO LIQD
237.0000 mL | Freq: Two times a day (BID) | ORAL | Status: DC
  Administered 2022-11-28 – 2022-11-29 (×3): 237 mL via ORAL

## 2022-11-28 MED ORDER — ADULT MULTIVITAMIN W/MINERALS CH
1.0000 | ORAL_TABLET | Freq: Every day | ORAL | Status: DC
  Administered 2022-11-28 – 2022-11-30 (×3): 1 via ORAL
  Filled 2022-11-28 (×3): qty 1

## 2022-11-28 MED ORDER — ENOXAPARIN SODIUM 40 MG/0.4ML IJ SOSY
40.0000 mg | PREFILLED_SYRINGE | INTRAMUSCULAR | Status: DC
  Administered 2022-11-28 – 2022-11-29 (×2): 40 mg via SUBCUTANEOUS
  Filled 2022-11-28 (×2): qty 0.4

## 2022-11-28 NOTE — Plan of Care (Signed)

## 2022-11-28 NOTE — Progress Notes (Signed)
Initial Nutrition Assessment  DOCUMENTATION CODES:   Severe malnutrition in context of chronic illness  INTERVENTION:   -Liberalize diet to regular for widest variety of meal selections -MVI with minerals daily -Ensure Enlive po TID, each supplement provides 350 kcal and 20 grams of protein.  -Magic cup TID with meals, each supplement provides 290 kcal and 9 grams of protein   NUTRITION DIAGNOSIS:   Severe Malnutrition related to chronic illness as evidenced by moderate fat depletion, severe fat depletion, moderate muscle depletion, severe muscle depletion.  GOAL:   Patient will meet greater than or equal to 90% of their needs  MONITOR:   PO intake, Supplement acceptance  REASON FOR ASSESSMENT:   Consult Assessment of nutrition requirement/status  ASSESSMENT:   with medical history significant for bipolar disorder, BPH, depression, GERD, OCD, dyslipidemia and osteoarthritis, who presented to the emergency room with acute onset of generalized weakness with inability to ambulate as well as worsening dyspnea for the last week with cough productive of whitish sputum without significant wheezing  Pt admitted with NSTEMI and CAP.   Reviewed I/O's: +433 ml x 24 hours and +883 ml since admission  UOP: 700 ml x 24 hours  Spoke with pt and sister at bedside. Pt pleasant and in good spirits today. Pt' main complaint is his inability to walk. He has experienced a general decline in health over the past month, where he has been dependent on a walker to steady himself. Prior to this, pt was not steady on his feet and often had to hold on things to steady his balance. He reports he has a good appetite and ate "most" of his breakfast this morning. He denies any difficulty chewing and swallowing.   Per sister's, pt's other sister provides pt with a lot of care and support, including preparing 3 meals per day. Pt reports he eats well, but unable to provide a diet recall. Pt and sister confirm  that pt is a selective eater.   Pt reports he has lost weight over the years; his UBW is around 1603#. Sister reports he weighed that many years ago and lost weight secondary to side effects from bipolar medications. She does not think he has  lost weight recently. Reviewed wt hx; pt has experienced a 2.6% wt loss over the past 6 months, which is not significant for time frame.   Discussed importance of good meal and supplement intake to promote healing. Pt amenable to supplements, stating "I will try".   Medications reviewed and include vitamin B-12  Lab Results  Component Value Date   HGBA1C 5.9 (H) 06/14/2022   PTA DM medications are none.   Labs reviewed: CBGS: 67 (inpatient orders for glycemic control are none).    NUTRITION - FOCUSED PHYSICAL EXAM:  Flowsheet Row Most Recent Value  Orbital Region Moderate depletion  Upper Arm Region Severe depletion  Thoracic and Lumbar Region Severe depletion  Buccal Region Severe depletion  Temple Region Severe depletion  Clavicle Bone Region Severe depletion  Clavicle and Acromion Bone Region Severe depletion  Scapular Bone Region Severe depletion  Dorsal Hand Moderate depletion  Patellar Region Severe depletion  Anterior Thigh Region Severe depletion  Posterior Calf Region Severe depletion  Edema (RD Assessment) None  Hair Reviewed  Eyes Reviewed  Mouth Reviewed  Skin Reviewed  Nails Reviewed       Diet Order:   Diet Order             Diet regular Room service appropriate?  Yes; Fluid consistency: Thin  Diet effective now                   EDUCATION NEEDS:   Education needs have been addressed  Skin:  Skin Assessment: Reviewed RN Assessment  Last BM:  Unknown  Height:   Ht Readings from Last 1 Encounters:  11/26/22 5\' 10"  (1.778 m)    Weight:   Wt Readings from Last 1 Encounters:  11/26/22 61.2 kg    Ideal Body Weight:  75.5 kg  BMI:  Body mass index is 19.37 kg/m.  Estimated Nutritional Needs:    Kcal:  1800-2000  Protein:  90-105 grams  Fluid:  > 1.8 L    Levada Schilling, RD, LDN, CDCES Registered Dietitian III Certified Diabetes Care and Education Specialist Please refer to [facility] for RD and/or RD on-call/weekend/after hours pager

## 2022-11-28 NOTE — Progress Notes (Signed)
Progress Note   Patient: [name] UKG:[id number] DOB: 1945/09/07 DOA: 11/26/2022     1 DOS: the patient was seen and examined on 11/28/2022   Brief hospital course: [name] is a 77 y.o. male with medical history significant for bipolar disorder, BPH, depression, GERD, OCD, dyslipidemia, history of lung cancer status post surgery and osteoarthritis, who presented to the emergency room with acute onset of generalized weakness with inability to ambulate as well as worsening dyspnea for the last week with cough productive of whitish sputum without significant wheezing.  He admitted to chest pain only with cough.   Patient's troponin elevated, admitted to the hospitalist service for further management evaluation of community-acquired pneumonia, NSTEMI, started on IV biotics, heparin drip.  Patient is seen by cardiologist who recommended no further workup given no cardiac symptoms, unremarkable echocardiogram, poor functional status.  Assessment and Plan: CAP (community acquired pneumonia) Continue IV Rocephin and Zithromax. Mucolytic therapy be provided as well as duo nebs q.i.d. and q.4 hours p.r.n. Follow time, blood cultures.  Elevated troponin- Likely demand ischemia Troponins remain flat. Echocardiogram unremarkable. Cardiology evaluations appreciated who recommended no cardiac workup at this time. Heparin drip stopped. Continue aspirin, statin.  Dyslipidemia Continue statin therapy.  Chronic obstructive pulmonary disease (COPD) (HCC) The patient will be on scheduled and as needed DuoNebs. Continue Incruse Ellipta.  GERD without esophagitis On PPI therapy.  Severe malnutrition In the setting of chronic illness. Dietitian evaluation. Encourage oral diet, supplements  History of lung cancer Status post resection. Outpatient oncology follow-up.    Generalized weakness PT OT evaluation. Encourage out of bed to chair. Incentive spirometry. Nursing  supportive care.  Fall, aspiration precautions. DVT prophylaxis-Lovenox   Code Status: Do not attempt resuscitation (DNR) PRE-ARREST INTERVENTIONS DESIRED  Subjective: Patient is seen and examined today morning.  He is lying comfortably.  Has dry cough, states eating better.  Energy poor.  Sister at bedside concerned that he is weak and not able to walk since last week.  Physical Exam: Vitals:   11/28/22 0747 11/28/22 0758 11/28/22 1205 11/28/22 1528  BP: 137/69  103/73 100/65  Pulse: 75  65 69  Resp:    18  Temp: 98.8 F (37.1 C)  98.7 F (37.1 C) 98.5 F (36.9 C)  TempSrc:    Oral  SpO2: 98% 98% 100% 99%  Weight:      Height:        General - Elderly Caucasian thin built male, no apparent distress HEENT - PERRLA, EOMI, atraumatic head, non tender sinuses. Lung - Clear, diffuse rales, rhonchi, no wheezes. Heart - S1, S2 heard, no murmurs, rubs, no pedal edema. Abdomen - Soft, non tender, bowel sounds good Neuro - Alert, awake and oriented x 3, non focal exam. Skin - Warm and dry.  Data Reviewed:      Latest Ref Rng & Units 11/28/2022    4:32 AM 11/27/2022   11:22 AM 11/27/2022    6:20 AM  CBC  WBC 4.0 - 10.5 K/uL 7.6   7.8   Hemoglobin 13.0 - 17.0 g/dL 9.3  9.5  9.6   Hematocrit 39.0 - 52.0 % 28.7   29.3   Platelets 150 - 400 K/uL 123   130       Latest Ref Rng & Units 11/28/2022    4:32 AM 11/27/2022    6:20 AM 11/26/2022    3:20 PM  BMP  Glucose 70 - 99 mg/dL 762  81  831  BUN 8 - 23 mg/dL 24  29  26    Creatinine 0.61 - 1.24 mg/dL 2.95  6.21  3.08   Sodium 135 - 145 mmol/L 140  142  141   Potassium 3.5 - 5.1 mmol/L 3.9  3.9  4.3   Chloride 98 - 111 mmol/L 109  108  107   CO2 22 - 32 mmol/L 27  25  27    Calcium 8.9 - 10.3 mg/dL 8.3  8.5  9.4    ECHOCARDIOGRAM COMPLETE  Result Date: 11/27/2022    ECHOCARDIOGRAM REPORT   Patient Name:   [name] Date of Exam: 11/27/2022 Medical Rec #:  [id number]        Height:       70.0 in Accession #:     [id number]       Weight:       135.0 lb Date of Birth:  11-08-1945        BSA:          1.766 m Patient Age:    77 years         BP:           94/59 mmHg Patient Gender: M                HR:           64 bpm. Exam Location:  [facility] Procedure: 2D Echo, Color Doppler and Cardiac Doppler Indications:     NSTEMI I21.4  History:         Patient has no prior history of Echocardiogram examinations.                  Risk Factors:Dyslipidemia. Emphysema.  Sonographer:     Cristela Blue Referring Phys:  [id number] JAN A MANSY Diagnosing Phys: Lorine Bears MD  Sonographer Comments: Technically challenging study due to limited acoustic windows and no parasternal window. Image acquisition challenging due to patient body habitus. IMPRESSIONS  1. Left ventricular ejection fraction, by estimation, is 55 to 60%. The left ventricle has normal function. The left ventricle has no regional wall motion abnormalities. Left ventricular diastolic parameters are consistent with Grade I diastolic dysfunction (impaired relaxation).  2. Right ventricular systolic function is normal. The right ventricular size is normal. Tricuspid regurgitation signal is inadequate for assessing PA pressure.  3. The mitral valve is normal in structure. No evidence of mitral valve regurgitation. No evidence of mitral stenosis.  4. The aortic valve is normal in structure. Aortic valve regurgitation is not visualized. Aortic valve sclerosis/calcification is present, without any evidence of aortic stenosis. FINDINGS  Left Ventricle: Left ventricular ejection fraction, by estimation, is 55 to 60%. The left ventricle has normal function. The left ventricle has no regional wall motion abnormalities. The left ventricular internal cavity size was normal in size. There is  no left ventricular hypertrophy. Left ventricular diastolic parameters are consistent with Grade I diastolic dysfunction (impaired relaxation). Right Ventricle: The right ventricular size is normal. No  increase in right ventricular wall thickness. Right ventricular systolic function is normal. Tricuspid regurgitation signal is inadequate for assessing PA pressure. Left Atrium: Left atrial size was normal in size. Right Atrium: Right atrial size was normal in size. Pericardium: There is no evidence of pericardial effusion. Mitral Valve: The mitral valve is normal in structure. No evidence of mitral valve regurgitation. No evidence of mitral valve stenosis. MV peak gradient, 4.4 mmHg. The mean mitral valve gradient is 1.0 mmHg. Tricuspid Valve: The tricuspid valve  is normal in structure. Tricuspid valve regurgitation is not demonstrated. No evidence of tricuspid stenosis. Aortic Valve: The aortic valve is normal in structure. Aortic valve regurgitation is not visualized. Aortic valve sclerosis/calcification is present, without any evidence of aortic stenosis. Aortic valve mean gradient measures 3.0 mmHg. Aortic valve peak  gradient measures 5.6 mmHg. Aortic valve area, by VTI measures 3.24 cm. Pulmonic Valve: The pulmonic valve was normal in structure. Pulmonic valve regurgitation is not visualized. No evidence of pulmonic stenosis. Aorta: The aortic root is normal in size and structure. Venous: The inferior vena cava was not well visualized. IAS/Shunts: No atrial level shunt detected by color flow Doppler.  LEFT VENTRICLE PLAX 2D LVIDd:         3.90 cm   Diastology LVIDs:         2.80 cm   LV e' medial:    8.05 cm/s LV PW:         0.80 cm   LV E/e' medial:  8.9 LV IVS:        1.10 cm   LV e' lateral:   11.00 cm/s LVOT diam:     2.00 cm   LV E/e' lateral: 6.5 LV SV:         70 LV SV Index:   40 LVOT Area:     3.14 cm  RIGHT VENTRICLE RV Basal diam:  3.40 cm RV Mid diam:    2.80 cm RV S prime:     11.50 cm/s TAPSE (M-mode): 2.0 cm LEFT ATRIUM             Index        RIGHT ATRIUM           Index LA diam:        3.10 cm 1.76 cm/m   RA Area:     13.50 cm LA Vol (A2C):   37.3 ml 21.12 ml/m  RA Volume:   32.40 ml   18.34 ml/m LA Vol (A4C):   22.5 ml 12.74 ml/m LA Biplane Vol: 30.5 ml 17.27 ml/m  AORTIC VALVE AV Area (Vmax):    3.00 cm AV Area (Vmean):   2.83 cm AV Area (VTI):     3.24 cm AV Vmax:           118.50 cm/s AV Vmean:          79.600 cm/s AV VTI:            0.217 m AV Peak Grad:      5.6 mmHg AV Mean Grad:      3.0 mmHg LVOT Vmax:         113.00 cm/s LVOT Vmean:        71.700 cm/s LVOT VTI:          0.224 m LVOT/AV VTI ratio: 1.03  AORTA Ao Root diam: 3.10 cm MITRAL VALVE                TRICUSPID VALVE MV Area (PHT): 2.53 cm     TR Peak grad:   18.1 mmHg MV Area VTI:   2.79 cm     TR Vmax:        213.00 cm/s MV Peak grad:  4.4 mmHg MV Mean grad:  1.0 mmHg     SHUNTS MV Vmax:       1.05 m/s     Systemic VTI:  0.22 m MV Vmean:      56.7 cm/s    Systemic Diam: 2.00 cm  MV Decel Time: 300 msec MV E velocity: 71.80 cm/s MV A velocity: 101.00 cm/s MV E/A ratio:  0.71 Lorine Bears MD Electronically signed by Lorine Bears MD Signature Date/Time: 11/27/2022/5:08:06 PM    Final    CT Head Wo Contrast  Result Date: 11/26/2022 CLINICAL DATA:  Two month history of shortness of breath. X-rays with a possible posterior basal infiltrate. There is a history of lung cancer. There is progressive worsening of weakness x6 months with mental status changes. EXAM: CT HEAD WITHOUT CONTRAST CT CHEST WITHOUT CONTRAST TECHNIQUE: Contiguous axial images were obtained from the base of the skull through the vertex without intravenous contrast. Multidetector CT imaging of the chest and upper abdomen was performed following the standard protocol without IV contrast. RADIATION DOSE REDUCTION: This exam was performed according to the departmental dose-optimization program which includes automated exposure control, adjustment of the mA and/or kV according to patient size and/or use of iterative reconstruction technique. COMPARISON:  Head CT 01/06/2021, chest CT with contrast 06/02/2022, chest CT without contrast 01/30/2022, and PA and  lateral chest today. FINDINGS: CT HEAD FINDINGS Brain: There is moderately advanced cerebral and mild cerebellar parenchymal atrophy with moderate to severe small vessel disease of the cerebral white matter. There are tiny chronic gangliocapsular lacunar infarcts. Dystrophic calcifications are present in the frontal falx. No new asymmetry is seen worrisome for an acute cortical based infarct, hemorrhage or mass and there is no midline shift. Vascular: There are calcific plaques in both siphons both distal vertebral arteries. No hyperdense central vessel is seen. Skull: Negative for fractures or focal lesions. Sinuses/Orbits: There is moderate membrane disease in the left sphenoid air cell, mild scattered disease in the ethmoids. Other paranasal sinuses are clear. There is new trace fluid the right mastoid tip. The mastoid air cells are otherwise clear. Old lens replacements are again shown with otherwise negative orbits. Other: None. CT CHEST FINDINGS Cardiovascular: There is atherosclerosis in the great vessels and aorta. The coronary arteries are heavily calcified. The aorta is tortuous without aneurysm. The cardiac size is normal. There is no pericardial effusion. The pulmonary arteries and veins are normal caliber. Mediastinum/Nodes: Limited assessment for hilar adenopathy without contrast but no new contour deforming abnormality is suspected. No mediastinal or axillary adenopathy is seen. Thyroid gland is unremarkable. Interval increased thickening of the distal thoracic esophagus suggesting esophagitis with evidence of prior fundoplication once again. The upper thoracic esophagus, the thoracic trachea, both main bronchi are unremarkable. Lungs/Pleura: Mild-to-moderate centrilobular emphysema is again noted with additional paraseptal emphysematous change in the upper lobes. There are postsurgical changes and scarring from remote posterior wedge resections in the right upper and lower lobes. Calcifications  versus postsurgical changes are again seen in the anterior aspect of the left upper lobe on 4:56-58. Irregular chronic nodular appearance around left upper lobe surgical clips measures similar to the prior CTs, on 4:49 measuring 1.5 x 1.2 cm with pleural-parenchymal stranding lateral to it. Stable chronic 6 mm right middle lobe nodule is again noted on 4:106 with adjacent linear scar-like opacity. There is new patchy airspace disease in the posterior basal bilateral lower lobes compatible with pneumonia, additional new focal consolidation medially in the left lower lobe superior segment. Increased mild bronchial thickening in both lower lobes. Additional chronic scattered linear scar-like opacities are again seen in both bases as well as post pneumonic subpleural cystic scarring anteriorly in the left lower lobe and in the dorsal lingular base. Rest of the lungs are generally clear. There is  trace right pleural fluid. No left pleural effusion or pneumothorax. Upper abdomen: No acute abnormality. Abdominal aortic atherosclerosis. Small hiatal hernia. Musculoskeletal: Osteopenia, thoracic kyphosis and degenerative change. No acute or other significant osseous findings. Paucity of body fat may suggest cachexia. Subcutaneous fat stores are slightly thinner than previously. IMPRESSION: 1. No acute intracranial CT findings or interval changes. Atrophy and small-vessel disease. 2. New patchy airspace disease in the posterior basal bilateral lower lobes compatible with pneumonia, with additional new focal consolidation medially in the left lower lobe superior segment. Increased bronchial thickening in both lower lobes. 3. Emphysema, postsurgical and scarring changes. 4. Stable chronic nodular appearance around surgical clips in the left upper lobe. 5. Stable chronic 6 mm right middle lobe nodule. 6. Aortic and coronary artery atherosclerosis. 7. Small hiatal hernia with increased thickening of the distal thoracic esophagus  suggesting esophagitis, above a prior fundoplication. 8. Paucity of body fat which may suggest cachexia. 9. Osteopenia and degenerative change. Aortic Atherosclerosis (ICD10-I70.0) and Emphysema (ICD10-J43.9). Electronically Signed   By: Almira Bar M.D.   On: 11/26/2022 23:20   CT Chest Wo Contrast  Result Date: 11/26/2022 CLINICAL DATA:  Two month history of shortness of breath. X-rays with a possible posterior basal infiltrate. There is a history of lung cancer. There is progressive worsening of weakness x6 months with mental status changes. EXAM: CT HEAD WITHOUT CONTRAST CT CHEST WITHOUT CONTRAST TECHNIQUE: Contiguous axial images were obtained from the base of the skull through the vertex without intravenous contrast. Multidetector CT imaging of the chest and upper abdomen was performed following the standard protocol without IV contrast. RADIATION DOSE REDUCTION: This exam was performed according to the departmental dose-optimization program which includes automated exposure control, adjustment of the mA and/or kV according to patient size and/or use of iterative reconstruction technique. COMPARISON:  Head CT 01/06/2021, chest CT with contrast 06/02/2022, chest CT without contrast 01/30/2022, and PA and lateral chest today. FINDINGS: CT HEAD FINDINGS Brain: There is moderately advanced cerebral and mild cerebellar parenchymal atrophy with moderate to severe small vessel disease of the cerebral white matter. There are tiny chronic gangliocapsular lacunar infarcts. Dystrophic calcifications are present in the frontal falx. No new asymmetry is seen worrisome for an acute cortical based infarct, hemorrhage or mass and there is no midline shift. Vascular: There are calcific plaques in both siphons both distal vertebral arteries. No hyperdense central vessel is seen. Skull: Negative for fractures or focal lesions. Sinuses/Orbits: There is moderate membrane disease in the left sphenoid air cell, mild  scattered disease in the ethmoids. Other paranasal sinuses are clear. There is new trace fluid the right mastoid tip. The mastoid air cells are otherwise clear. Old lens replacements are again shown with otherwise negative orbits. Other: None. CT CHEST FINDINGS Cardiovascular: There is atherosclerosis in the great vessels and aorta. The coronary arteries are heavily calcified. The aorta is tortuous without aneurysm. The cardiac size is normal. There is no pericardial effusion. The pulmonary arteries and veins are normal caliber. Mediastinum/Nodes: Limited assessment for hilar adenopathy without contrast but no new contour deforming abnormality is suspected. No mediastinal or axillary adenopathy is seen. Thyroid gland is unremarkable. Interval increased thickening of the distal thoracic esophagus suggesting esophagitis with evidence of prior fundoplication once again. The upper thoracic esophagus, the thoracic trachea, both main bronchi are unremarkable. Lungs/Pleura: Mild-to-moderate centrilobular emphysema is again noted with additional paraseptal emphysematous change in the upper lobes. There are postsurgical changes and scarring from remote posterior wedge resections in the  right upper and lower lobes. Calcifications versus postsurgical changes are again seen in the anterior aspect of the left upper lobe on 4:56-58. Irregular chronic nodular appearance around left upper lobe surgical clips measures similar to the prior CTs, on 4:49 measuring 1.5 x 1.2 cm with pleural-parenchymal stranding lateral to it. Stable chronic 6 mm right middle lobe nodule is again noted on 4:106 with adjacent linear scar-like opacity. There is new patchy airspace disease in the posterior basal bilateral lower lobes compatible with pneumonia, additional new focal consolidation medially in the left lower lobe superior segment. Increased mild bronchial thickening in both lower lobes. Additional chronic scattered linear scar-like opacities  are again seen in both bases as well as post pneumonic subpleural cystic scarring anteriorly in the left lower lobe and in the dorsal lingular base. Rest of the lungs are generally clear. There is trace right pleural fluid. No left pleural effusion or pneumothorax. Upper abdomen: No acute abnormality. Abdominal aortic atherosclerosis. Small hiatal hernia. Musculoskeletal: Osteopenia, thoracic kyphosis and degenerative change. No acute or other significant osseous findings. Paucity of body fat may suggest cachexia. Subcutaneous fat stores are slightly thinner than previously. IMPRESSION: 1. No acute intracranial CT findings or interval changes. Atrophy and small-vessel disease. 2. New patchy airspace disease in the posterior basal bilateral lower lobes compatible with pneumonia, with additional new focal consolidation medially in the left lower lobe superior segment. Increased bronchial thickening in both lower lobes. 3. Emphysema, postsurgical and scarring changes. 4. Stable chronic nodular appearance around surgical clips in the left upper lobe. 5. Stable chronic 6 mm right middle lobe nodule. 6. Aortic and coronary artery atherosclerosis. 7. Small hiatal hernia with increased thickening of the distal thoracic esophagus suggesting esophagitis, above a prior fundoplication. 8. Paucity of body fat which may suggest cachexia. 9. Osteopenia and degenerative change. Aortic Atherosclerosis (ICD10-I70.0) and Emphysema (ICD10-J43.9). Electronically Signed   By: Almira Bar M.D.   On: 11/26/2022 23:20     Family Communication: Discussed with sister at bedside, they understand and agree. All questions answereed.    Disposition: Status is: Inpatient Remains inpatient appropriate because: pneumonia on IV antibiotics.  Planned Discharge Destination: Home with Home Health     Time spent: 40 minutes  Author: Marcelino Duster, MD 11/28/2022 4:37 PM Secure chat 7am to 7pm For on call review  [email or web address].

## 2022-11-28 NOTE — Progress Notes (Signed)
ANTICOAGULATION CONSULT NOTE  Pharmacy Consult for heparin infusion Indication: ACS/STEMI  Allergies  Allergen Reactions   Ropinirole Hcl Other (See Comments)    Confusion, agiitation, disorientation, Hallucinations   Tramadol     lethargy   Patient Measurements: Height: 5\' 10"  (177.8 cm) Weight: 61.2 kg (135 lb) IBW/kg (Calculated) : 73 Heparin Dosing Weight: 61.2 kg  Vital Signs: Temp: 98.8 F (37.1 C) (11/19 0747) BP: 137/69 (11/19 0747) Pulse Rate: 75 (11/19 0747)  Labs: Recent Labs    11/26/22 1520 11/27/22 0005 11/27/22 0010 11/27/22 0620 11/27/22 1015 11/27/22 1122 11/27/22 2038 11/28/22 0432 11/28/22 0759  HGB 12.2*  --   --  9.6*  --  9.5*  --  9.3*  --   HCT 37.9*  --   --  29.3*  --   --   --  28.7*  --   PLT 197  --   --  130*  --   --   --  123*  --   APTT  --   --  30  --   --   --   --   --   --   LABPROT  --   --  14.4  --   --   --   --   --   --   INR  --   --  1.1  --   --   --   --   --   --   HEPARINUNFRC  --   --   --   --  0.19*  --  0.22*  --  0.44  CREATININE 1.05  --   --  0.89  --   --   --  1.04  --   CKTOTAL 60  --   --   --   --   --   --   --   --   TROPONINIHS 301* 299*  --  293*  --   --   --   --   --    Estimated Creatinine Clearance: 51.5 mL/min (by C-G formula based on SCr of 1.04 mg/dL).  Medical History: Past Medical History:  Diagnosis Date   Arthritis    in his fingers   Bipolar disorder (HCC)    BPH (benign prostatic hyperplasia)    Cancer (HCC) 2020   LUNG CA   Depression    Emphysema    Episodic confusion    Possible bipolar disorder   GERD (gastroesophageal reflux disease)    History of chicken pox    History of hiatal hernia    HNP (herniated nucleus pulposus), lumbar    Hyperlipidemia    Memory deficit 07/31/2012   OCD (obsessive compulsive disorder)    Peripheral neuropathy    Restless leg syndrome 01/10/2003   Unspecified psychosis 03/01/2012   Wears dentures    full upper and lower   Wears  hearing aid in both ears    Assessment: Pt is a 77 yo male presenting to ED c/o progressive worsening of weakness x 6 months and SOB w/ exertion x 2 months, found with elevated troponin I level 293 on 11/18.   Goal of Therapy:  Heparin level 0.3-0.7 units/ml Monitor platelets by anticoagulation protocol: Yes  Labs:  11/18 1015 HL = 0.19; subtherapeutic  11/19 0759  HL = 0.44; therapeutic x 1   Plan:   Continue infusion rate to 1150 units/hr  Recheck heparin level in 8 hours  Monitor CBC daily while on  heparin  Effie Shy, PharmD Pharmacy Resident  11/28/2022 9:21 AM

## 2022-11-28 NOTE — Progress Notes (Signed)
Transition of Care [facility]) - Inpatient Brief Assessment   Patient Details  Name: JATERRIUS GERLITZ MRN: [id number] Date of Birth: [birth year]  [facility] CM/SW Contact:    Truddie Hidden, RN Phone Number: 11/28/2022, 10:10 AM   Clinical Narrative: TOC continuing to follow patient's progress throughout discharge planning.   Transition of Care Asessment: Insurance and Status: Insurance coverage has been reviewed Patient has primary care physician: Yes Home environment has been reviewed: Home Prior level of function:: independent Prior/Current Home Services: No current home services Social Determinants of Health Reivew: SDOH reviewed no interventions necessary Readmission risk has been reviewed: Yes Transition of care needs: no transition of care needs at this time

## 2022-11-29 ENCOUNTER — Ambulatory Visit: Payer: PPO | Admitting: Family Medicine

## 2022-11-29 DIAGNOSIS — E43 Unspecified severe protein-calorie malnutrition: Secondary | ICD-10-CM | POA: Diagnosis not present

## 2022-11-29 DIAGNOSIS — K219 Gastro-esophageal reflux disease without esophagitis: Secondary | ICD-10-CM | POA: Diagnosis not present

## 2022-11-29 DIAGNOSIS — I214 Non-ST elevation (NSTEMI) myocardial infarction: Secondary | ICD-10-CM | POA: Diagnosis not present

## 2022-11-29 DIAGNOSIS — R202 Paresthesia of skin: Secondary | ICD-10-CM

## 2022-11-29 DIAGNOSIS — J439 Emphysema, unspecified: Secondary | ICD-10-CM | POA: Diagnosis not present

## 2022-11-29 DIAGNOSIS — R0609 Other forms of dyspnea: Secondary | ICD-10-CM | POA: Diagnosis not present

## 2022-11-29 DIAGNOSIS — J189 Pneumonia, unspecified organism: Secondary | ICD-10-CM | POA: Diagnosis not present

## 2022-11-29 DIAGNOSIS — Z515 Encounter for palliative care: Secondary | ICD-10-CM

## 2022-11-29 LAB — CBC
HCT: 31.1 % — ABNORMAL LOW (ref 39.0–52.0)
Hemoglobin: 10.3 g/dL — ABNORMAL LOW (ref 13.0–17.0)
MCH: 33.8 pg (ref 26.0–34.0)
MCHC: 33.1 g/dL (ref 30.0–36.0)
MCV: 102 fL — ABNORMAL HIGH (ref 80.0–100.0)
Platelets: 127 10*3/uL — ABNORMAL LOW (ref 150–400)
RBC: 3.05 MIL/uL — ABNORMAL LOW (ref 4.22–5.81)
RDW: 15 % (ref 11.5–15.5)
WBC: 7.5 10*3/uL (ref 4.0–10.5)
nRBC: 0 % (ref 0.0–0.2)

## 2022-11-29 NOTE — Progress Notes (Signed)
MEWS Progress Note  Patient Details Name: [name] MRN: [id number] DOB: 06-27-1945 Today's Date: 11/29/2022   MEWS Flowsheet Documentation:  Assess: MEWS Score Temp: 98.2 F (36.8 C) BP: 128/82 MAP (mmHg): 96 Pulse Rate: 64 ECG Heart Rate: 63 Resp: 19 Level of Consciousness: Alert SpO2: (!) 88 % O2 Device: Room Air Patient Activity (if Appropriate): In bed Assess: MEWS Score MEWS Temp: 0 MEWS Systolic: 0 MEWS Pulse: 0 MEWS RR: 0 MEWS LOC: 0 MEWS Score: 0 MEWS Score Color: Green Assess: SIRS CRITERIA SIRS Temperature : 0 SIRS Respirations : 0 SIRS Pulse: 0 SIRS WBC: 0 SIRS Score Sum : 0 SIRS Temperature : 0 SIRS Pulse: 0 SIRS Respirations : 0 SIRS WBC: 0 SIRS Score Sum : 0 Assess: if the MEWS score is Yellow or Red Were vital signs accurate and taken at a resting state?: No, vital signs rechecked Does the patient meet 2 or more of the SIRS criteria?: No MEWS guidelines implemented : No, previously yellow, continue vital signs every 4 hours (inaccurate results, rechecked)        Laqueta Due 11/29/2022, 8:56 AM

## 2022-11-29 NOTE — Progress Notes (Signed)
Progress Note   Patient: [name]:[id number] DOB: 06/01/1945 DOA: 11/26/2022     2 DOS: the patient was seen and examined on 11/29/2022   Brief hospital course: [name] is a 77 y.o. male with medical history significant for bipolar disorder, BPH, depression, GERD, OCD, dyslipidemia, history of lung cancer status post surgery and osteoarthritis, who presented to the emergency room with acute onset of generalized weakness with inability to ambulate as well as worsening dyspnea for the last week with cough productive of whitish sputum without significant wheezing.  He admitted to chest pain only with cough.   Patient's troponin elevated, admitted to the hospitalist service for further management evaluation of community-acquired pneumonia, NSTEMI, started on IV biotics, heparin drip.  Patient is seen by cardiologist who recommended no further workup given no cardiac symptoms, unremarkable echocardiogram, poor functional status.  11/20: Palliative care consult for goals of care  Assessment and Plan: CAP (community acquired pneumonia) Continue IV Rocephin and Zithromax. Mucolytic therapy be provided as well as duo nebs q.i.d. and q.4 hours p.r.n. blood cultures. neg  Elevated troponin- Due to demand ischemia Troponins remain flat. Echocardiogram unremarkable. Cardiology evaluations appreciated who recommended no cardiac workup at this time. Heparin drip stopped. Continue aspirin, statin.  Dyslipidemia Continue statin therapy.  Chronic obstructive pulmonary disease (COPD) (HCC) The patient will be on scheduled and as needed DuoNebs. Continue Incruse Ellipta.  GERD without esophagitis On PPI therapy.  Severe malnutrition In the setting of chronic illness. Dietitian evaluation. Encourage oral diet, supplements  History of lung cancer Status post resection. Outpatient oncology follow-up.    Generalized weakness PT OT -> home health PT Encourage out of bed  to chair. Incentive spirometry. Nursing supportive care.  Fall, aspiration precautions. DVT prophylaxis-Lovenox   Code Status: Do not attempt resuscitation (DNR) PRE-ARREST INTERVENTIONS DESIRED  Subjective: Feeling weak.  Still has some cough and shortness of breath although much better than when he first came in Physical Exam: Vitals:   11/29/22 0736 11/29/22 0800 11/29/22 1221 11/29/22 1525  BP: 128/82  121/67 101/67  Pulse: (!) 112 64 71   Resp: 17 19 16    Temp: 98.2 F (36.8 C)  98.4 F (36.9 C)   TempSrc: Oral  Oral   SpO2: (!) 88%  98%   Weight:      Height:        General - Elderly Caucasian thin built male, no apparent distress HEENT - PERRLA, EOMI, atraumatic head, non tender sinuses. Lung - Clear, diffuse rales, rhonchi, no wheezes. Heart - S1, S2 heard, no murmurs, rubs, no pedal edema. Abdomen - Soft, non tender, bowel sounds good Neuro - Alert, awake and oriented x 3, non focal exam. Skin - Warm and dry.  Data Reviewed:      Latest Ref Rng & Units 11/29/2022    4:29 AM 11/28/2022    4:32 AM 11/27/2022   11:22 AM  CBC  WBC 4.0 - 10.5 K/uL 7.5  7.6    Hemoglobin 13.0 - 17.0 g/dL 28.4  9.3  9.5   Hematocrit 39.0 - 52.0 % 31.1  28.7    Platelets 150 - 400 K/uL 127  123        Latest Ref Rng & Units 11/28/2022    4:32 AM 11/27/2022    6:20 AM 11/26/2022    3:20 PM  BMP  Glucose 70 - 99 mg/dL 132  81  440   BUN 8 - 23 mg/dL 24  29  26   Creatinine 0.61 - 1.24 mg/dL 6.57  8.46  9.62   Sodium 135 - 145 mmol/L 140  142  141   Potassium 3.5 - 5.1 mmol/L 3.9  3.9  4.3   Chloride 98 - 111 mmol/L 109  108  107   CO2 22 - 32 mmol/L 27  25  27    Calcium 8.9 - 10.3 mg/dL 8.3  8.5  9.4    No results found.   Family Communication: Discussed with sister at bedside, they understand and agree. All questions answereed.    Disposition: Status is: Inpatient Remains inpatient appropriate because: pneumonia on IV antibiotics.  Planned Discharge Destination:  Home with Home Health in next 1 to 2 days     Time spent: 30 minutes  Author: Delfino Lovett, MD 11/29/2022 5:32 PM Secure chat 7am to 7pm For on call review [email or web address].

## 2022-11-29 NOTE — Plan of Care (Signed)
Progressing towards goals

## 2022-11-29 NOTE — Evaluation (Signed)
Physical Therapy Evaluation Patient Details Name: [name] MRN: [id number] DOB: March 07, 1945 Today's Date: 11/29/2022  History of Present Illness  [name] is a 77 y.o. male with medical history significant for bipolar disorder, BPH, depression, GERD, OCD, dyslipidemia, history of lung cancer status post surgery and osteoarthritis, who presented to the emergency room with acute onset of generalized weakness with inability to ambulate as well as worsening dyspnea.   Clinical Impression  Pt alert and oriented to person, place, and situation. Denies pain and very hard of hearing, but very pleasant and receptive to PT. Pt lives alone with 1 STE, family and friends available PRN, was modI with  SPC or SW for household ambulation, and mod I with ADL/IADLs (although requiring increased time/effort) prior to recent hospitalization. During eval, pt required supervision for STS, CGA for ambulation in room with RW, and mod I with bed mobility. Pt on RA t/o, SpO2 checked with every positional change, stayed above 98% t/o session. Main functional limitations include cardiopulmonary fatigue, endurance, and strength. Pt would benefit from continued skilled acute PT to progress toward mobility goals and maximize functional independence.       If plan is discharge home, recommend the following: A little help with walking and/or transfers;A little help with bathing/dressing/bathroom;Assistance with cooking/housework;Assist for transportation;Help with stairs or ramp for entrance   Can travel by private vehicle   Yes    Equipment Recommendations Rolling walker (2 wheels);BSC/3in1  Recommendations for Other Services       Functional Status Assessment Patient has had a recent decline in their functional status and demonstrates the ability to make significant improvements in function in a reasonable and predictable amount of time.     Precautions / Restrictions Precautions Precautions:  Fall Restrictions Weight Bearing Restrictions: No      Mobility  Bed Mobility Overal bed mobility: Modified Independent         Sit to supine: Used rails, Modified independent (Device/Increase time)        Transfers Overall transfer level: Needs assistance Equipment used: Rolling walker (2 wheels) Transfers: Sit to/from Stand Sit to Stand: Supervision           General transfer comment: cuing for proper hand placement; able to stand safely with RW 2x t/o session    Ambulation/Gait Ambulation/Gait assistance: Contact guard assist Gait Distance (Feet): 15 Feet Assistive device: Rolling walker (2 wheels) Gait Pattern/deviations: Narrow base of support, Decreased step length - right, Decreased step length - left Gait velocity: decreased     General Gait Details: pt able to walk around bed with CGA and RW, take lateral steps L and R alongside bed, CGA 2/2 generalized LE weakness; observed heavy breathing after amb (SpO2 lowest 98% with mobility)  Stairs            Wheelchair Mobility     Tilt Bed    Modified Rankin (Stroke Patients Only)       Balance Overall balance assessment: Needs assistance Sitting-balance support: No upper extremity supported, Feet supported Sitting balance-Leahy Scale: Good     Standing balance support: Reliant on assistive device for balance, During functional activity, Bilateral upper extremity supported Standing balance-Leahy Scale: Fair                               Pertinent Vitals/Pain Pain Assessment Pain Assessment: No/denies pain    Home Living Family/patient expects to be discharged to:: Private residence Living  Arrangements: Alone Available Help at Discharge: Family;Available PRN/intermittently Type of Home: House Home Access: Stairs to enter Entrance Stairs-Rails: None Entrance Stairs-Number of Steps: 1 threshold   Home Layout: One level Home Equipment: Cane - single point;Standard Environmental consultant       Prior Function Prior Level of Function : Independent/Modified Independent             Mobility Comments: Pt reports he is generally MOD I for mobility using a SPC or a SW on occasion. Endorses 1 non-injurious fall in the last 6 months. ADLs Comments: Reports he is generally independent for ADL management. Intermitently using his shower's soap dispenser as grab bar for stability in walk-in shower. Sister lives next door and can assist with IADL management when pt is not feeling well.     Extremity/Trunk Assessment   Upper Extremity Assessment Upper Extremity Assessment: Defer to OT evaluation    Lower Extremity Assessment Lower Extremity Assessment: Generalized weakness    Cervical / Trunk Assessment Cervical / Trunk Assessment: Normal  Communication   Communication Communication: Hearing impairment;Difficulty following commands/understanding Following commands: Follows one step commands with increased time Cueing Techniques: Verbal cues;Gestural cues  Cognition Arousal: Alert Behavior During Therapy: WFL for tasks assessed/performed Overall Cognitive Status: Within Functional Limits for tasks assessed                                 General Comments: Difficult to assess 2/2 hearing impairment and broken hearing aid. Pt oriented to self, place, and situation. Follows VCs with increased time/cueing.        General Comments General comments (skin integrity, edema, etc.): Pt on RA t/o, SpO2 checked with every positional change, stayed above 98% t/o session    Exercises     Assessment/Plan    PT Assessment Patient needs continued PT services  PT Problem List Decreased strength;Decreased mobility;Decreased safety awareness;Decreased range of motion;Decreased activity tolerance;Decreased balance;Decreased knowledge of use of DME;Cardiopulmonary status limiting activity       PT Treatment Interventions DME instruction;Therapeutic exercise;Gait  training;Stair training;Balance training;Functional mobility training;Therapeutic activities;Patient/family education    PT Goals (Current goals can be found in the Care Plan section)  Acute Rehab PT Goals Patient Stated Goal: to get stronger PT Goal Formulation: With patient Time For Goal Achievement: 12/13/22 Potential to Achieve Goals: Good    Frequency Min 1X/week     Co-evaluation               AM-PAC PT "6 Clicks" Mobility  Outcome Measure Help needed turning from your back to your side while in a flat bed without using bedrails?: None Help needed moving from lying on your back to sitting on the side of a flat bed without using bedrails?: None Help needed moving to and from a bed to a chair (including a wheelchair)?: A Little Help needed standing up from a chair using your arms (e.g., wheelchair or bedside chair)?: A Little Help needed to walk in hospital room?: A Little Help needed climbing 3-5 steps with a railing? : A Lot 6 Click Score: 19    End of Session Equipment Utilized During Treatment: Gait belt Activity Tolerance: Patient tolerated treatment well;Patient limited by fatigue Patient left: in bed;with call bell/phone within reach;with bed alarm set Nurse Communication: Mobility status PT Visit Diagnosis: Unsteadiness on feet (R26.81);Muscle weakness (generalized) (M62.81);History of falling (Z91.81);Other abnormalities of gait and mobility (R26.89)    Time: 7253-6644 PT Time Calculation (  min) (ACUTE ONLY): 19 min   Charges:   PT Evaluation $PT Eval Low Complexity: 1 Low PT Treatments $Therapeutic Activity: 8-22 mins PT General Charges $$ ACUTE PT VISIT: 1 Visit          Shauna Hugh 11/29/2022, 12:15 PM

## 2022-11-29 NOTE — Consult Note (Signed)
Consultation Note Date: 11/29/2022 at 1445  Patient Name: [name]  DOB: 1945/02/19  MRN: [id number]  Age / Sex: 77 y.o., male  PCP: Malva Limes, MD Referring Physician: Delfino Lovett, MD  HPI/Patient Profile: 77 y.o. male  with past medical history of GERD, bipolar disorder, memory impairment, BPH, lung cancer status post resection (2020), depression, OCD, restless leg syndrome, hiatal hernia, HOH, dyslipidemia, OA, COPD, and malnutrition admitted on 11/26/2022 with progressive bilateral lower extremity weakness for approximately the last 6 months as well as dyspnea on exertion for the proximately last 2 to 3 months.  Patient had a fall at home while walking to the mailbox, and became unable to ambulate with worsening dyspnea.  Patient is being treated for pneumonia (antibiotics) and cardiology was consulted for elevated troponins.  As per cardiology's recommendations, no further ischemic workup is needed and ACS is not suspected.  Heparin GTT was discontinued.  ASA and statins were continued.  PMT was consulted to discuss goals of care..   Clinical Assessment and Goals of Care: Extensive chart review completed prior to meeting patient including labs, vital signs, imaging, progress notes, orders, and available advanced directive documents from current and previous encounters. I then met with patinet and his sister [name] at bedside  to discuss diagnosis prognosis, GOC, EOL wishes, disposition and options.  I introduced Palliative Medicine as specialized medical care for people living with serious illness. It focuses on providing relief from the symptoms and stress of a serious illness. The goal is to improve quality of life for both the patient and the family.  We discussed a brief life review of the patient.  Patient is 1 of 4 siblings (1 sister has passed away).  His sister [name] checks on him frequently.   Patient lives at home alone and endorses he was growing more more weak over the past 6 months.  He denies difficulty with an appetite though he is a picky eater.  No problem with p.o. intake or diet.  We discussed patient's current illness -pneumonia-and what it means in the larger context of patient's on-going co-morbidities-progressive decline in functional status and mobility.    I attempted to elicit values and goals of care important to the patient.  Patient shares she is already feeling better since admission to the hospital.  He was having difficulty walking without completely losing his breath.  Now, he shares he is more tolerant of mobility without losing his breath as easily.  His goal is to get stronger so that he can return home to being independent again.  Patient's Sister [name] shares she does not want him placed in a facility for rehab.  She supports patient being discharged to home with PT/OT or other therapies to occur at his home.   Advance directives, concepts specific to code status, artificial feeding and hydration, and rehospitalization were considered and discussed.  Patient and [name] confirm DNR and DNI status.  Patient shares he does not want to ever have his life kept sustained by machines.  He does not want any artificial means to keep him alive.  Discussed treating the treatable.  [name] inquired about platelet levels.  Education provided on blood work including platelets, troponins, and hemoglobin.  Discussed the bigger picture of patient's overall decline in functional status.  Patient and sister endorsed they both have had a tough year with losing not only their sister but with losing phase daughter.  Therapeutic silence, active listening, and emotional support provided.  Patient seems motivated to participate with therapies to get stronger.  Discussed recovering from pneumonia may be longer than another acute illness such as the cold or flu.  Patient states understanding and  appreciation for education.  Questions and concerns were addressed.  PMT will continue to follow and support patient throughout his hospitalization.  During my visit, NT at bedside to give a bath.  Patient was in agreement for PMT to continue goals of care discussions with him tomorrow.  Primary Decision Maker PATIENT  Physical Exam Vitals reviewed.  Constitutional:      Appearance: He is normal weight.  HENT:     Head: Normocephalic.     Ears:     Comments: HOH Eyes:     Pupils: Pupils are equal, round, and reactive to light.     Comments: glasses  Pulmonary:     Effort: Pulmonary effort is normal.  Abdominal:     Palpations: Abdomen is soft.  Neurological:     Mental Status: He is alert and oriented to person, place, and time.  Psychiatric:        Mood and Affect: Mood normal.        Behavior: Behavior normal.        Thought Content: Thought content normal.        Judgment: Judgment normal.     Palliative Assessment/Data: 60%     Thank you for this consult. Palliative medicine will continue to follow and assist holistically.   Time Total: 75 minutes  Time spent includes: Detailed review of medical records (labs, imaging, vital signs), medically appropriate exam (mental status, respiratory, cardiac, skin), discussed with treatment team, counseling and educating patient, family and staff, documenting clinical information, medication management and coordination of care.  Signed by: Georgiann Cocker, DNP, FNP-BC Palliative Medicine   Please contact Palliative Medicine Team providers via [facility] for questions and concerns.

## 2022-11-29 NOTE — Evaluation (Signed)
Occupational Therapy Evaluation Patient Details Name: [name] MRN: [id number] DOB: 01/27/1945 Today's Date: 11/29/2022   History of Present Illness [name] is a 77 y.o. male with medical history significant for bipolar disorder, BPH, depression, GERD, OCD, dyslipidemia, history of lung cancer status post surgery and osteoarthritis, who presented to the emergency room with acute onset of generalized weakness with inability to ambulate as well as worsening dyspnea.   Clinical Impression   [name] was seen for OT evaluation this date. Prior to hospital admission, pt was MOD I for ADL management using a SPC for functional mobility. Pt lives alone in a 1 level home (with a single step threshold on the inside of his home). Pt presents to acute OT demonstrating impaired ADL performance and functional mobility 2/2 generalized weakness, decreased activity tolerance, and limited balance (See OT problem list for additional functional deficits). Pt currently requires SUPERVISION for safety with all functional mobility and STS ADL management.  Pt would benefit from skilled OT services to address noted impairments and functional limitations (see below for any additional details) in order to maximize safety and independence while minimizing falls risk and caregiver burden. Anticipate the need for follow up OT services in the home setting upon acute hospital DC.        If plan is discharge home, recommend the following: A little help with walking and/or transfers;A little help with bathing/dressing/bathroom;Assistance with cooking/housework;Assist for transportation;Help with stairs or ramp for entrance    Functional Status Assessment  Patient has had a recent decline in their functional status and demonstrates the ability to make significant improvements in function in a reasonable and predictable amount of time.  Equipment Recommendations  BSC/3in1    Recommendations for Other Services        Precautions / Restrictions Precautions Precautions: Fall Restrictions Weight Bearing Restrictions: No      Mobility Bed Mobility Overal bed mobility: Needs Assistance Bed Mobility: Supine to Sit     Supine to sit: Used rails, Modified independent (Device/Increase time)          Transfers Overall transfer level: Needs assistance Equipment used: Rolling walker (2 wheels) Transfers: Sit to/from Stand, Bed to chair/wheelchair/BSC Sit to Stand: Supervision     Step pivot transfers: Supervision     General transfer comment: Hands on assist to position RW appropriately for upright standing posture. Otherwise no physical assist for functional tranfers this date. Pt able to STS multiple times during session with min cueing for technique.      Balance Overall balance assessment: Needs assistance Sitting-balance support: No upper extremity supported, Feet supported Sitting balance-[name] Scale: Good     Standing balance support: Reliant on assistive device for balance, During functional activity, Bilateral upper extremity supported Standing balance-[name] Scale: Fair                             ADL either performed or assessed with clinical judgement   ADL Overall ADL's : Needs assistance/impaired                                       General ADL Comments: Pt functionally limited by generalized weakness and decreased activity tolerance. He requires supervision for safety for bed/functional mobility, as well as simulated toilet transfer this date. Min cueing for safe use of RW t/o functional mobility. Pt able to  perform seated UB ADL management including self feeding with set up assist.     Vision Baseline Vision/History: 1 Wears glasses Ability to See in Adequate Light: 1 Impaired Patient Visual Report: No change from baseline       Perception         Praxis         Pertinent Vitals/Pain Pain Assessment Pain Assessment:  No/denies pain     Extremity/Trunk Assessment             Communication Communication Communication: Hearing impairment Cueing Techniques: Verbal cues;Gestural cues   Cognition Arousal: Alert Behavior During Therapy: WFL for tasks assessed/performed Overall Cognitive Status: Within Functional Limits for tasks assessed                                 General Comments: Difficult to assess 2/2 hearing impairment and broken hearing aid. Pt generally oriented to self, place, and situation. Follows VCs with increased time/cueing.     General Comments       Exercises Other Exercises Other Exercises: Pt educated on falls prevention strategies, safe use of AE/DME for ADL management, and DC recs.   Shoulder Instructions      Home Living Family/patient expects to be discharged to:: Private residence Living Arrangements: Alone Available Help at Discharge: Family;Available PRN/intermittently (Sister lives next door.) Type of Home: House Home Access: Level entry     Home Layout: One level (Small threshold step inside the home)     Bathroom Shower/Tub: Producer, television/film/video: Standard     Home Equipment: Cane - single point;Standard Environmental consultant          Prior Functioning/Environment Prior Level of Function : Independent/Modified Independent             Mobility Comments: Pt reports he is generally MOD I for mobility using a SPC or a SW on occasion. Endorses 1 non-injurious fall in the last 6 months. ADLs Comments: Reports he is generally independent for ADL management. Intermitently using his shower's soap dispenser as grab bar for stability in walk-in shower. Sister lives next door and can assist with IADL management when pt is not feeling well.        OT Problem List: Decreased strength;Decreased coordination;Decreased activity tolerance;Decreased safety awareness;Impaired balance (sitting and/or standing);Decreased knowledge of use of DME or  AE      OT Treatment/Interventions: Self-care/ADL training;Therapeutic exercise;DME and/or AE instruction;Balance training;Energy conservation;Patient/family education    OT Goals(Current goals can be found in the care plan section) Acute Rehab OT Goals Patient Stated Goal: To go home OT Goal Formulation: With patient Time For Goal Achievement: 12/13/22 Potential to Achieve Goals: Good ADL Goals Pt Will Perform Grooming: sitting;standing;with modified independence Pt Will Perform Lower Body Dressing: sit to/from stand;with modified independence;with adaptive equipment Pt Will Transfer to Toilet: bedside commode;ambulating;with modified independence Pt Will Perform Toileting - Clothing Manipulation and hygiene: sit to/from stand;with modified independence;with adaptive equipment  OT Frequency: Min 1X/week    Co-evaluation              AM-PAC OT "6 Clicks" Daily Activity     Outcome Measure Help from another person eating meals?: None Help from another person taking care of personal grooming?: None Help from another person toileting, which includes using toliet, bedpan, or urinal?: A Little Help from another person bathing (including washing, rinsing, drying)?: A Little Help from another person to put on and  taking off regular upper body clothing?: A Little Help from another person to put on and taking off regular lower body clothing?: A Little 6 Click Score: 20   End of Session Equipment Utilized During Treatment: Gait belt;Rolling walker (2 wheels)  Activity Tolerance: Patient tolerated treatment well Patient left: in chair;with call bell/phone within reach;with chair alarm set  OT Visit Diagnosis: Unsteadiness on feet (R26.81);Muscle weakness (generalized) (M62.81)                Time: 1610-9604 OT Time Calculation (min): 20 min Charges:  OT General Charges $OT Visit: 1 Visit OT Evaluation $OT Eval Low Complexity: 1 Low OT Treatments $Self Care/Home Management : 8-22  mins  Rockney Ghee, M.S., OTR/L 11/29/22, 10:34 AM

## 2022-11-30 ENCOUNTER — Telehealth: Payer: Self-pay

## 2022-11-30 DIAGNOSIS — I214 Non-ST elevation (NSTEMI) myocardial infarction: Secondary | ICD-10-CM | POA: Diagnosis not present

## 2022-11-30 DIAGNOSIS — E43 Unspecified severe protein-calorie malnutrition: Secondary | ICD-10-CM | POA: Diagnosis not present

## 2022-11-30 DIAGNOSIS — R0609 Other forms of dyspnea: Secondary | ICD-10-CM

## 2022-11-30 DIAGNOSIS — Z515 Encounter for palliative care: Secondary | ICD-10-CM | POA: Diagnosis not present

## 2022-11-30 DIAGNOSIS — J189 Pneumonia, unspecified organism: Secondary | ICD-10-CM | POA: Diagnosis not present

## 2022-11-30 DIAGNOSIS — R202 Paresthesia of skin: Secondary | ICD-10-CM | POA: Diagnosis not present

## 2022-11-30 MED ORDER — GUAIFENESIN ER 600 MG PO TB12: 600.0000 mg | ORAL_TABLET | Freq: Two times a day (BID) | ORAL | 0 refills | Status: AC

## 2022-11-30 MED ORDER — AMOXICILLIN-POT CLAVULANATE 500-125 MG PO TABS: 1.0000 | ORAL_TABLET | Freq: Three times a day (TID) | ORAL | 0 refills | Status: AC

## 2022-11-30 MED ORDER — ENSURE ENLIVE PO LIQD: 237.0000 mL | Freq: Two times a day (BID) | ORAL | 12 refills | Status: DC

## 2022-11-30 NOTE — Progress Notes (Signed)
Patient is not able to walk the distance required to go the bathroom, or he/she is unable to safely negotiate stairs required to access the bathroom.  A BSC will alleviate this problem.

## 2022-11-30 NOTE — Care Management Important Message (Signed)
Important Message  Patient Details  Name: [name] MRN: [id number] Date of Birth: Jun 05, 1945   Important Message Given:  Yes - Medicare IM     Olegario Messier A Avarey Yaeger 11/30/2022, 9:10 AM

## 2022-11-30 NOTE — Progress Notes (Signed)
Palliative Care Progress Note, Assessment & Plan   Patient Name: [name]       Date: 11/30/2022 DOB: 23-Mar-1945  Age: 77 y.o. MRN#: [id number] Attending Physician: Delfino Lovett, MD Primary Care Physician: Malva Limes, MD Admit Date: 11/26/2022  Subjective: Patient is sitting up in bed in no apparent distress.  He is awake, alert, and oriented x 4.  He has no acute complaints at this time.  No family or friends present during my visit.  HPI: 77 y.o. male  with past medical history of GERD, bipolar disorder, memory impairment, BPH, lung cancer status post resection (2020), depression, OCD, restless leg syndrome, hiatal hernia, HOH, dyslipidemia, OA, COPD, and malnutrition admitted on 11/26/2022 with progressive bilateral lower extremity weakness for approximately the last 6 months as well as dyspnea on exertion for the proximately last 2 to 3 months.  Patient had a fall at home while walking to the mailbox, and became unable to ambulate with worsening dyspnea.   Patient is being treated for pneumonia (antibiotics) and cardiology was consulted for elevated troponins.  As per cardiology's recommendations, no further ischemic workup is needed and ACS is not suspected.  Heparin GTT was discontinued.  ASA and statins were continued.   PMT was consulted to discuss goals of care.  Summary of counseling/coordination of care: Extensive chart review completed prior to meeting patient including labs, vital signs, imaging, progress notes, orders, and available advanced directive documents from current and previous encounters.   After reviewing the patient's chart and assessing the patient at bedside, I spoke with patient in regards to symptom management and plan of care.  Symptoms assessed.  Patient shares  that he is feeling much better.  He has no acute complaints at this time.  He denies headache, chest pain, or other acute ailments.  No adjustment to [facility] needed at this time.  I discussed next steps with patient.  He believes he will be discharged today.  I again highlighted the importance of advanced care planning.  In the event that patient is unable to speak for himself, he has named his sister [name] is his next of kin decision maker.  I encouraged him to create a living will or HCPOA to not only outline his surrogate decision maker but also make his advance care wishes known.  Patient shares appreciation and understanding of importance of advanced care planning.  He shares that he will speak with his family about this once he is home and settled.  No further palliative needs at this time.  Physical Exam Vitals reviewed.  Constitutional:      Appearance: He is normal weight.  HENT:     Ears:     Comments: HOH Eyes:     Pupils: Pupils are equal, round, and reactive to light.  Pulmonary:     Effort: Pulmonary effort is normal.  Abdominal:     Palpations: Abdomen is soft.  Neurological:     Mental Status: He is alert and oriented to person, place, and time.  Psychiatric:        Mood and Affect: Mood normal.        Behavior: Behavior normal.  Thought Content: Thought content normal.        Judgment: Judgment normal.             Total Time 25 minutes   Time spent includes: Detailed review of medical records (labs, imaging, vital signs), medically appropriate exam (mental status, respiratory, cardiac, skin), discussed with treatment team, counseling and educating patient, family and staff, documenting clinical information, medication management and coordination of care.  Samara Deist L. Bonita Quin, DNP, FNP-BC Palliative Medicine Team

## 2022-11-30 NOTE — TOC Transition Note (Addendum)
Transition of Care [facility]) - CM/SW Discharge Note   Patient Details  Name: [name] MRN: [id number] Date of Birth: Jan 22, 1945  [facility] CM/SW Contact:  Truddie Hidden, RN Phone Number: 11/30/2022, 9:51 AM   Clinical Narrative:    Spoke with patient regarding discharge plan and SNF recommendation for HH/ PT/ OT. Patient is agreeable to [facility] PT and does not have a choice of an agency. Patient advised the accepting agency will contact him directly to scheduled SOC within 48 post discharge. Patient stated he has a walker "It's old." He has been advised a request for a walker via [employer] will be sent and delivered to his room.   Request for RW and [facility] sent to Jon from [employer].   Referral for [facility] sent and accepted by Adelina Mings from [facility].   TOC signing off.           Patient Goals and CMS Choice      Discharge Placement                         Discharge Plan and Services Additional resources added to the After Visit Summary for                                       Social Determinants of Health (SDOH) Interventions SDOH Screenings   Food Insecurity: Patient Declined (11/27/2022)  Housing: Patient Declined (11/27/2022)  Transportation Needs: Patient Declined (11/27/2022)  Utilities: Patient Declined (11/27/2022)  Alcohol Screen: Low Risk  (06/14/2022)  Depression (PHQ2-9): Low Risk  (06/14/2022)  Financial Resource Strain: Patient Declined (11/02/2022)  Social Connections: Unknown (11/02/2022)  Tobacco Use: Medium Risk (11/27/2022)     Readmission Risk Interventions     No data to display

## 2022-11-30 NOTE — Discharge Summary (Addendum)
Physician Discharge Summary   Patient: [name] MRN: [id number] DOB: Jun 13, 1945  Admit date:     11/26/2022  Discharge date: 11/30/22  Discharge Physician: Delfino Lovett   PCP: Malva Limes, MD   Recommendations at discharge:    F/up with outpt providers as requested  Discharge Diagnoses: Elevated troponins Pneumonia  Hospital Course: Assessment and Plan:  [name] is a 77 y.o. male with medical history significant for bipolar disorder, BPH, depression, GERD, OCD, dyslipidemia, history of lung cancer status post surgery and osteoarthritis, who presented to the emergency room with acute onset of generalized weakness with inability to ambulate as well as worsening dyspnea for the last week with cough productive of whitish sputum without significant wheezing.  He admitted to chest pain only with cough.    Patient's troponin elevated, admitted to the hospitalist service for further management evaluation of community-acquired pneumonia, , started on IV antibiotics, heparin drip.  Patient is seen by cardiologist who recommended no further workup given no cardiac symptoms, unremarkable echocardiogram, poor functional status.   11/20: Palliative care consult for goals of care   Assessment and Plan: CAP (community acquired pneumonia) Treated with IV Rocephin and Zithromax while in the Hospital blood cultures. neg   Elevated troponin- Due to demand ischemia Troponins remain flat. Echocardiogram unremarkable. Cardiology evaluations appreciated who recommended no cardiac workup at this time.   Dyslipidemia Continue statin    Chronic obstructive pulmonary disease (COPD) (HCC) stable   GERD without esophagitis On PPI    Severe malnutrition In the setting of chronic illness. Encourage oral diet, supplements   History of lung cancer Status post resection. Outpatient oncology follow-up.   Generalized weakness PT OT -> home health PT TOC has set up HHPT          Consultants: Palliative care Disposition: Home health Diet recommendation:  Discharge Diet Orders (From admission, onward)     Start     Ordered   11/30/22 0000  Diet - low sodium heart healthy        11/30/22 0839           Carb modified diet DISCHARGE MEDICATION: Allergies as of 11/30/2022       Reactions   Ropinirole Hcl Other (See Comments)   Confusion, agiitation, disorientation, Hallucinations   Tramadol    lethargy        Medication List     TAKE these medications    amoxicillin-clavulanate 500-125 MG tablet Commonly known as: Augmentin Take 1 tablet by mouth 3 (three) times daily for 3 days.   aspirin 81 MG tablet Take 1 tablet (81 mg total) by mouth daily.   cholecalciferol 25 MCG (1000 UNIT) tablet Commonly known as: VITAMIN D3 Take 1,000 Units by mouth daily.   CoQ10 100 MG Caps Take 100 mg by mouth daily.   cyanocobalamin 1000 MCG tablet Commonly known as: VITAMIN B12 Take 1,000 mcg by mouth daily.   divalproex 500 MG 24 hr tablet Commonly known as: DEPAKOTE ER Take 1,000 mg by mouth at bedtime.   feeding supplement Liqd Take 237 mLs by mouth 2 (two) times daily between meals.   guaiFENesin 600 MG 12 hr tablet Commonly known as: MUCINEX Take 1 tablet (600 mg total) by mouth 2 (two) times daily for 5 days.   omeprazole 40 MG capsule Commonly known as: PRILOSEC TAKE 1 CAPSULE BY MOUTH EVERY DAY   simvastatin 40 MG tablet Commonly known as: ZOCOR TAKE 1 TABLET BY MOUTH EVERY DAY  AT 6PM   umeclidinium bromide 62.5 MCG/ACT Aepb Commonly known as: INCRUSE ELLIPTA Inhale 1 puff into the lungs daily.   ziprasidone 40 MG capsule Commonly known as: GEODON Take 40 mg by mouth daily with supper.        Follow-up Information     Lonell Face, MD. Schedule an appointment as soon as possible for a visit in 3 week(s).   Specialty: Neurology Why: [facility] Discharge F/UP Contact information: [address] [facility] [place] [address] [phone]         Malva Limes, MD. Schedule an appointment as soon as possible for a visit in 1 week(s).   Specialty: Family Medicine Contact information: [address] [place] [address] [phone]         [facility] Emergency Department at [facility] .   Specialty: Emergency Medicine Why: If symptoms worsen Contact information: [address] [place] [address] [phone]               Discharge Exam: Ceasar Mons Weights   11/26/22 1504  Weight: 61.2 kg   General - Elderly Caucasian thin built male, no apparent distress HEENT - PERRLA, EOMI, atraumatic head, non tender sinuses. Lung - Clear, diffuse rales, rhonchi, no wheezes. Heart - S1, S2 heard, no murmurs, rubs, no pedal edema. Abdomen - Soft, non tender, bowel sounds good Neuro - Alert, awake and oriented x 3, non focal exam. Skin - Warm and dry.  Condition at discharge: good  The results of significant diagnostics from this hospitalization (including imaging, microbiology, ancillary and laboratory) are listed below for reference.   Imaging Studies: ECHOCARDIOGRAM COMPLETE  Result Date: 11/27/2022    ECHOCARDIOGRAM REPORT   Patient Name:   [name] Date of Exam: 11/27/2022 Medical Rec #:  [id number]        Height:       70.0 in Accession #:    [id number]       Weight:       135.0 lb Date of Birth:  01-Jun-1945        BSA:          1.766 m Patient Age:    77 years         BP:           94/59 mmHg Patient Gender: M                HR:           64 bpm. Exam Location:  [facility] Procedure: 2D Echo, Color Doppler and Cardiac Doppler Indications:     NSTEMI I21.4  History:         Patient has no prior history of Echocardiogram examinations.                  Risk Factors:Dyslipidemia. Emphysema.  Sonographer:     Cristela Blue Referring Phys:  [id number] JAN A MANSY Diagnosing Phys: Lorine Bears MD  Sonographer Comments:  Technically challenging study due to limited acoustic windows and no parasternal window. Image acquisition challenging due to patient body habitus. IMPRESSIONS  1. Left ventricular ejection fraction, by estimation, is 55 to 60%. The left ventricle has normal function. The left ventricle has no regional wall motion abnormalities. Left ventricular diastolic parameters are consistent with Grade I diastolic dysfunction (impaired relaxation).  2. Right ventricular systolic function is normal. The right ventricular size is normal. Tricuspid regurgitation signal is inadequate for assessing  PA pressure.  3. The mitral valve is normal in structure. No evidence of mitral valve regurgitation. No evidence of mitral stenosis.  4. The aortic valve is normal in structure. Aortic valve regurgitation is not visualized. Aortic valve sclerosis/calcification is present, without any evidence of aortic stenosis. FINDINGS  Left Ventricle: Left ventricular ejection fraction, by estimation, is 55 to 60%. The left ventricle has normal function. The left ventricle has no regional wall motion abnormalities. The left ventricular internal cavity size was normal in size. There is  no left ventricular hypertrophy. Left ventricular diastolic parameters are consistent with Grade I diastolic dysfunction (impaired relaxation). Right Ventricle: The right ventricular size is normal. No increase in right ventricular wall thickness. Right ventricular systolic function is normal. Tricuspid regurgitation signal is inadequate for assessing PA pressure. Left Atrium: Left atrial size was normal in size. Right Atrium: Right atrial size was normal in size. Pericardium: There is no evidence of pericardial effusion. Mitral Valve: The mitral valve is normal in structure. No evidence of mitral valve regurgitation. No evidence of mitral valve stenosis. MV peak gradient, 4.4 mmHg. The mean mitral valve gradient is 1.0 mmHg. Tricuspid Valve: The tricuspid valve is  normal in structure. Tricuspid valve regurgitation is not demonstrated. No evidence of tricuspid stenosis. Aortic Valve: The aortic valve is normal in structure. Aortic valve regurgitation is not visualized. Aortic valve sclerosis/calcification is present, without any evidence of aortic stenosis. Aortic valve mean gradient measures 3.0 mmHg. Aortic valve peak  gradient measures 5.6 mmHg. Aortic valve area, by VTI measures 3.24 cm. Pulmonic Valve: The pulmonic valve was normal in structure. Pulmonic valve regurgitation is not visualized. No evidence of pulmonic stenosis. Aorta: The aortic root is normal in size and structure. Venous: The inferior vena cava was not well visualized. IAS/Shunts: No atrial level shunt detected by color flow Doppler.  LEFT VENTRICLE PLAX 2D LVIDd:         3.90 cm   Diastology LVIDs:         2.80 cm   LV e' medial:    8.05 cm/s LV PW:         0.80 cm   LV E/e' medial:  8.9 LV IVS:        1.10 cm   LV e' lateral:   11.00 cm/s LVOT diam:     2.00 cm   LV E/e' lateral: 6.5 LV SV:         70 LV SV Index:   40 LVOT Area:     3.14 cm  RIGHT VENTRICLE RV Basal diam:  3.40 cm RV Mid diam:    2.80 cm RV S prime:     11.50 cm/s TAPSE (M-mode): 2.0 cm LEFT ATRIUM             Index        RIGHT ATRIUM           Index LA diam:        3.10 cm 1.76 cm/m   RA Area:     13.50 cm LA Vol (A2C):   37.3 ml 21.12 ml/m  RA Volume:   32.40 ml  18.34 ml/m LA Vol (A4C):   22.5 ml 12.74 ml/m LA Biplane Vol: 30.5 ml 17.27 ml/m  AORTIC VALVE AV Area (Vmax):    3.00 cm AV Area (Vmean):   2.83 cm AV Area (VTI):     3.24 cm AV Vmax:           118.50 cm/s AV Vmean:  79.600 cm/s AV VTI:            0.217 m AV Peak Grad:      5.6 mmHg AV Mean Grad:      3.0 mmHg LVOT Vmax:         113.00 cm/s LVOT Vmean:        71.700 cm/s LVOT VTI:          0.224 m LVOT/AV VTI ratio: 1.03  AORTA Ao Root diam: 3.10 cm MITRAL VALVE                TRICUSPID VALVE MV Area (PHT): 2.53 cm     TR Peak grad:   18.1 mmHg MV Area  VTI:   2.79 cm     TR Vmax:        213.00 cm/s MV Peak grad:  4.4 mmHg MV Mean grad:  1.0 mmHg     SHUNTS MV Vmax:       1.05 m/s     Systemic VTI:  0.22 m MV Vmean:      56.7 cm/s    Systemic Diam: 2.00 cm MV Decel Time: 300 msec MV E velocity: 71.80 cm/s MV A velocity: 101.00 cm/s MV E/A ratio:  0.71 Lorine Bears MD Electronically signed by Lorine Bears MD Signature Date/Time: 11/27/2022/5:08:06 PM    Final    CT Head Wo Contrast  Result Date: 11/26/2022 CLINICAL DATA:  Two month history of shortness of breath. X-rays with a possible posterior basal infiltrate. There is a history of lung cancer. There is progressive worsening of weakness x6 months with mental status changes. EXAM: CT HEAD WITHOUT CONTRAST CT CHEST WITHOUT CONTRAST TECHNIQUE: Contiguous axial images were obtained from the base of the skull through the vertex without intravenous contrast. Multidetector CT imaging of the chest and upper abdomen was performed following the standard protocol without IV contrast. RADIATION DOSE REDUCTION: This exam was performed according to the departmental dose-optimization program which includes automated exposure control, adjustment of the mA and/or kV according to patient size and/or use of iterative reconstruction technique. COMPARISON:  Head CT 01/06/2021, chest CT with contrast 06/02/2022, chest CT without contrast 01/30/2022, and PA and lateral chest today. FINDINGS: CT HEAD FINDINGS Brain: There is moderately advanced cerebral and mild cerebellar parenchymal atrophy with moderate to severe small vessel disease of the cerebral white matter. There are tiny chronic gangliocapsular lacunar infarcts. Dystrophic calcifications are present in the frontal falx. No new asymmetry is seen worrisome for an acute cortical based infarct, hemorrhage or mass and there is no midline shift. Vascular: There are calcific plaques in both siphons both distal vertebral arteries. No hyperdense central vessel is seen. Skull:  Negative for fractures or focal lesions. Sinuses/Orbits: There is moderate membrane disease in the left sphenoid air cell, mild scattered disease in the ethmoids. Other paranasal sinuses are clear. There is new trace fluid the right mastoid tip. The mastoid air cells are otherwise clear. Old lens replacements are again shown with otherwise negative orbits. Other: None. CT CHEST FINDINGS Cardiovascular: There is atherosclerosis in the great vessels and aorta. The coronary arteries are heavily calcified. The aorta is tortuous without aneurysm. The cardiac size is normal. There is no pericardial effusion. The pulmonary arteries and veins are normal caliber. Mediastinum/Nodes: Limited assessment for hilar adenopathy without contrast but no new contour deforming abnormality is suspected. No mediastinal or axillary adenopathy is seen. Thyroid gland is unremarkable. Interval increased thickening of the distal thoracic esophagus suggesting esophagitis with evidence  of prior fundoplication once again. The upper thoracic esophagus, the thoracic trachea, both main bronchi are unremarkable. Lungs/Pleura: Mild-to-moderate centrilobular emphysema is again noted with additional paraseptal emphysematous change in the upper lobes. There are postsurgical changes and scarring from remote posterior wedge resections in the right upper and lower lobes. Calcifications versus postsurgical changes are again seen in the anterior aspect of the left upper lobe on 4:56-58. Irregular chronic nodular appearance around left upper lobe surgical clips measures similar to the prior CTs, on 4:49 measuring 1.5 x 1.2 cm with pleural-parenchymal stranding lateral to it. Stable chronic 6 mm right middle lobe nodule is again noted on 4:106 with adjacent linear scar-like opacity. There is new patchy airspace disease in the posterior basal bilateral lower lobes compatible with pneumonia, additional new focal consolidation medially in the left lower lobe  superior segment. Increased mild bronchial thickening in both lower lobes. Additional chronic scattered linear scar-like opacities are again seen in both bases as well as post pneumonic subpleural cystic scarring anteriorly in the left lower lobe and in the dorsal lingular base. Rest of the lungs are generally clear. There is trace right pleural fluid. No left pleural effusion or pneumothorax. Upper abdomen: No acute abnormality. Abdominal aortic atherosclerosis. Small hiatal hernia. Musculoskeletal: Osteopenia, thoracic kyphosis and degenerative change. No acute or other significant osseous findings. Paucity of body fat may suggest cachexia. Subcutaneous fat stores are slightly thinner than previously. IMPRESSION: 1. No acute intracranial CT findings or interval changes. Atrophy and small-vessel disease. 2. New patchy airspace disease in the posterior basal bilateral lower lobes compatible with pneumonia, with additional new focal consolidation medially in the left lower lobe superior segment. Increased bronchial thickening in both lower lobes. 3. Emphysema, postsurgical and scarring changes. 4. Stable chronic nodular appearance around surgical clips in the left upper lobe. 5. Stable chronic 6 mm right middle lobe nodule. 6. Aortic and coronary artery atherosclerosis. 7. Small hiatal hernia with increased thickening of the distal thoracic esophagus suggesting esophagitis, above a prior fundoplication. 8. Paucity of body fat which may suggest cachexia. 9. Osteopenia and degenerative change. Aortic Atherosclerosis (ICD10-I70.0) and Emphysema (ICD10-J43.9). Electronically Signed   By: Almira Bar M.D.   On: 11/26/2022 23:20   CT Chest Wo Contrast  Result Date: 11/26/2022 CLINICAL DATA:  Two month history of shortness of breath. X-rays with a possible posterior basal infiltrate. There is a history of lung cancer. There is progressive worsening of weakness x6 months with mental status changes. EXAM: CT HEAD  WITHOUT CONTRAST CT CHEST WITHOUT CONTRAST TECHNIQUE: Contiguous axial images were obtained from the base of the skull through the vertex without intravenous contrast. Multidetector CT imaging of the chest and upper abdomen was performed following the standard protocol without IV contrast. RADIATION DOSE REDUCTION: This exam was performed according to the departmental dose-optimization program which includes automated exposure control, adjustment of the mA and/or kV according to patient size and/or use of iterative reconstruction technique. COMPARISON:  Head CT 01/06/2021, chest CT with contrast 06/02/2022, chest CT without contrast 01/30/2022, and PA and lateral chest today. FINDINGS: CT HEAD FINDINGS Brain: There is moderately advanced cerebral and mild cerebellar parenchymal atrophy with moderate to severe small vessel disease of the cerebral white matter. There are tiny chronic gangliocapsular lacunar infarcts. Dystrophic calcifications are present in the frontal falx. No new asymmetry is seen worrisome for an acute cortical based infarct, hemorrhage or mass and there is no midline shift. Vascular: There are calcific plaques in both siphons both distal vertebral  arteries. No hyperdense central vessel is seen. Skull: Negative for fractures or focal lesions. Sinuses/Orbits: There is moderate membrane disease in the left sphenoid air cell, mild scattered disease in the ethmoids. Other paranasal sinuses are clear. There is new trace fluid the right mastoid tip. The mastoid air cells are otherwise clear. Old lens replacements are again shown with otherwise negative orbits. Other: None. CT CHEST FINDINGS Cardiovascular: There is atherosclerosis in the great vessels and aorta. The coronary arteries are heavily calcified. The aorta is tortuous without aneurysm. The cardiac size is normal. There is no pericardial effusion. The pulmonary arteries and veins are normal caliber. Mediastinum/Nodes: Limited assessment for  hilar adenopathy without contrast but no new contour deforming abnormality is suspected. No mediastinal or axillary adenopathy is seen. Thyroid gland is unremarkable. Interval increased thickening of the distal thoracic esophagus suggesting esophagitis with evidence of prior fundoplication once again. The upper thoracic esophagus, the thoracic trachea, both main bronchi are unremarkable. Lungs/Pleura: Mild-to-moderate centrilobular emphysema is again noted with additional paraseptal emphysematous change in the upper lobes. There are postsurgical changes and scarring from remote posterior wedge resections in the right upper and lower lobes. Calcifications versus postsurgical changes are again seen in the anterior aspect of the left upper lobe on 4:56-58. Irregular chronic nodular appearance around left upper lobe surgical clips measures similar to the prior CTs, on 4:49 measuring 1.5 x 1.2 cm with pleural-parenchymal stranding lateral to it. Stable chronic 6 mm right middle lobe nodule is again noted on 4:106 with adjacent linear scar-like opacity. There is new patchy airspace disease in the posterior basal bilateral lower lobes compatible with pneumonia, additional new focal consolidation medially in the left lower lobe superior segment. Increased mild bronchial thickening in both lower lobes. Additional chronic scattered linear scar-like opacities are again seen in both bases as well as post pneumonic subpleural cystic scarring anteriorly in the left lower lobe and in the dorsal lingular base. Rest of the lungs are generally clear. There is trace right pleural fluid. No left pleural effusion or pneumothorax. Upper abdomen: No acute abnormality. Abdominal aortic atherosclerosis. Small hiatal hernia. Musculoskeletal: Osteopenia, thoracic kyphosis and degenerative change. No acute or other significant osseous findings. Paucity of body fat may suggest cachexia. Subcutaneous fat stores are slightly thinner than  previously. IMPRESSION: 1. No acute intracranial CT findings or interval changes. Atrophy and small-vessel disease. 2. New patchy airspace disease in the posterior basal bilateral lower lobes compatible with pneumonia, with additional new focal consolidation medially in the left lower lobe superior segment. Increased bronchial thickening in both lower lobes. 3. Emphysema, postsurgical and scarring changes. 4. Stable chronic nodular appearance around surgical clips in the left upper lobe. 5. Stable chronic 6 mm right middle lobe nodule. 6. Aortic and coronary artery atherosclerosis. 7. Small hiatal hernia with increased thickening of the distal thoracic esophagus suggesting esophagitis, above a prior fundoplication. 8. Paucity of body fat which may suggest cachexia. 9. Osteopenia and degenerative change. Aortic Atherosclerosis (ICD10-I70.0) and Emphysema (ICD10-J43.9). Electronically Signed   By: Almira Bar M.D.   On: 11/26/2022 23:20   DG Chest 2 View  Result Date: 11/26/2022 CLINICAL DATA:  Two-month history of shortness of breath EXAM: CHEST - 2 VIEW COMPARISON:  CT chest dated 06/02/2022 FINDINGS: Hyperinflated lungs. Patchy opacity in the posterior lung base. No pleural effusion or pneumothorax. The heart size and mediastinal contours are within normal limits. No acute osseous abnormality. Surgical clips project over the left hilum. Right apical surgical sutures. IMPRESSION: 1. Patchy  opacity in the posterior lung base, which may represent atelectasis or infection. 2. Hyperinflated lungs, which can be seen in the setting of COPD. Electronically Signed   By: Agustin Cree M.D.   On: 11/26/2022 15:51    Microbiology: Results for orders placed or performed during the hospital encounter of 05/02/19  SARS CORONAVIRUS 2 (TAT 6-24 HRS) Nasopharyngeal Nasopharyngeal Swab     Status: None   Collection Time: 05/02/19 10:49 AM   Specimen: Nasopharyngeal Swab  Result Value Ref Range Status   SARS Coronavirus 2  NEGATIVE NEGATIVE Final    Comment: (NOTE) SARS-CoV-2 target nucleic acids are NOT DETECTED. The SARS-CoV-2 RNA is generally detectable in upper and lower respiratory specimens during the acute phase of infection. Negative results do not preclude SARS-CoV-2 infection, do not rule out co-infections with other pathogens, and should not be used as the sole basis for treatment or other patient management decisions. Negative results must be combined with clinical observations, patient history, and epidemiological information. The expected result is Negative. Fact Sheet for Patients: HairSlick.no Fact Sheet for Healthcare Providers: quierodirigir.com This test is not yet approved or cleared by the [place] FDA and  has been authorized for detection and/or diagnosis of SARS-CoV-2 by FDA under an Emergency Use Authorization (EUA). This EUA will remain  in effect (meaning this test can be used) for the duration of the COVID-19 declaration under Section 56 4(b)(1) of the Act, 21 U.S.C. section 360bbb-3(b)(1), unless the authorization is terminated or revoked sooner. Performed at [facility], [address], [place], [place] [address]     Labs: CBC: Recent Labs  Lab 11/26/22 1520 11/27/22 0620 11/27/22 1122 11/28/22 0432 11/29/22 0429  WBC 10.8* 7.8  --  7.6 7.5  NEUTROABS 8.5*  --   --   --   --   HGB 12.2* 9.6* 9.5* 9.3* 10.3*  HCT 37.9* 29.3*  --  28.7* 31.1*  MCV 104.4* 102.1*  --  103.2* 102.0*  PLT 197 130*  --  123* 127*   Basic Metabolic Panel: Recent Labs  Lab 11/26/22 1520 11/27/22 0620 11/28/22 0432  NA 141 142 140  K 4.3 3.9 3.9  CL 107 108 109  CO2 27 25 27   GLUCOSE 124* 81 102*  BUN 26* 29* 24*  CREATININE 1.05 0.89 1.04  CALCIUM 9.4 8.5* 8.3*   Liver Function Tests: No results for input(s): "AST", "ALT", "ALKPHOS", "BILITOT", "PROT", "ALBUMIN" in the last 168 hours. CBG: No results for  input(s): "GLUCAP" in the last 168 hours.  Discharge time spent: greater than 30 minutes.  Signed: Delfino Lovett, MD [facility] 11/30/2022

## 2022-11-30 NOTE — Progress Notes (Signed)
PT READY FOR DICSCHARGE HOME WITH SISTER. REVIEWD MEDICATIONS, F/U APPOINTMENTS AND D/C INSTRUCTIONS. DME BSC AND WALKER WERE DELIVERED TO ROOM AND SENT WITH PATIENT.

## 2022-11-30 NOTE — Plan of Care (Signed)
Progressing towards goals

## 2022-11-30 NOTE — Telephone Encounter (Signed)
Copied from CRM [id number]. Topic: General - Other >> Nov 30, 2022 10:07 AM Haroldine Laws wrote: Reason for CRM: [facility] called saying they will be taking care of pt's palliative care.

## 2022-11-30 NOTE — Plan of Care (Signed)
Care plan completed

## 2022-12-01 ENCOUNTER — Telehealth: Payer: Self-pay

## 2022-12-01 NOTE — Patient Outreach (Signed)
Care Management  Transitions of Care Program Transitions of Care Post-discharge Initial    12/01/2022 Name: [name] MRN: [id number] DOB: Aug 03, 1945  Subjective: [name] is a 77 y.o. year old male who is a primary care patient of Fisher, Demetrios Isaacs, MD. The Care Management team Engaged with patient Engaged with patient by telephone to assess and address transitions of care needs.   Consent to Services:  Patient was given information about care management services, agreed to services, and gave verbal consent to participate.   Assessment:  Date of Discharge: 11/30/22 Discharge Facility: [facility]) Type of Discharge: Inpatient Admission Primary Inpatient Discharge Diagnosis:: Pneumonia  SDOH Interventions    Flowsheet Row Telephone from 12/01/2022 in [place] POPULATION HEALTH DEPARTMENT  SDOH Interventions   Food Insecurity Interventions Intervention Not Indicated  Housing Interventions Intervention Not Indicated  Transportation Interventions Intervention Not Indicated  Utilities Interventions Intervention Not Indicated        Goals Addressed             This Visit's Progress    Patient Stated       Current Barriers:  Knowledge Deficits related to plan of care for management of Bipolar Disorder and Generalized Weakness  Chronic Disease Management support and education needs related to Bipolar Disorder and Generalized Weakness   RNCM Clinical Goal(s):  Patient will work with the Care Management team over the next 30 days to address Transition of Care Barriers: Medication Management Diet/Nutrition/Food Resources Support at home Provider appointments Home Health services Functional/Safety verbalize basic understanding of  Bipolar Disorder and Generalized weakness disease process and self health management plan as evidenced by no re-admission to the hospital in the next 30 days  through collaboration with RN Care manager,  provider, and care team.   Interventions: Evaluation of current treatment plan related to  self management and patient's adherence to plan as established by provider   Generalized Weakness  (Status:  New goal.)  Short Term Goal Evaluation of current treatment plan related to Bipolar Disorder, Level of care concerns and Mental Health Concerns  self-management and patient's adherence to plan as established by provider. Discussed plans with patient for ongoing care management follow up and provided patient with direct contact information for care management team Evaluation of current treatment plan related to Bipolar and Weakness and patient's adherence to plan as established by provider Provided education to patient and/or caregiver about advanced directives Provided education to patient re: Palliative Care, Goals of Care  Weight Loss Interventions:  (Status:  New goal.) Short Term Goal Advised patient to discuss with primary care provider options regarding weight management Screening for signs and symptoms of depression Assessed social determinant of health barriers Nutritional Supplement such as Ensure    Patient Goals/Self-Care Activities: Participate in Transition of Care Program/Attend [facility] scheduled calls Notify RN Care Manager of [facility] call rescheduling needs Take all medications as prescribed Attend all scheduled provider appointments Attend church or other social activities Perform all self care activities independently  Call provider office for new concerns or questions   Follow Up Plan:  Telephone follow up appointment with care management team member scheduled for:  Friday November 29th at 1:15pm         [facility] Outreach completed. Interview with the patient's sister due to the patient is [facility]. His sister reports that he is weak and has some imbalance issues. HHPT has been ordered and starts 12/02/22. At baseline the patient has had slow decline.  The Hospitalist indicated a  Palliative Care consult for Goals of Care. The patient's sister states that the patient will remain in his own home and will not be transferred to any type of ALF or SNF. She cooks dinner for him and he manages other meals. He is independent with driving and ADL's. He has his PCP appointment made and he will consult with a Neurologist for cognitive eval. The patient agreed to the 30 day Outreach program. Reviewed goals for care. Please refer to Care Plan for goals and interventions.  Effectiveness of interventions, symptom management and outcomes will be evaluated weekly. Patient educated on red flags s/s to watch for and was encouraged to report any of these identified, any new symptoms, changes in baseline or medication regimen, change in health status / well-being, or safety concerns to PCP and / or the [facility] Case Management team.  The patient has been provided with contact information for the care management team and has been advised to call with any health-related questions or concerns. The patient verbalized understanding with current POC. The patient is directed to their insurance card regarding availability of benefits coverage.   Deidre Ala, RN Medical illustrator [facility]Population [facility] [phone]

## 2022-12-02 DIAGNOSIS — Z602 Problems related to living alone: Secondary | ICD-10-CM | POA: Diagnosis not present

## 2022-12-02 DIAGNOSIS — H9190 Unspecified hearing loss, unspecified ear: Secondary | ICD-10-CM | POA: Diagnosis not present

## 2022-12-02 DIAGNOSIS — J189 Pneumonia, unspecified organism: Secondary | ICD-10-CM | POA: Diagnosis not present

## 2022-12-02 DIAGNOSIS — G629 Polyneuropathy, unspecified: Secondary | ICD-10-CM | POA: Diagnosis not present

## 2022-12-02 DIAGNOSIS — E785 Hyperlipidemia, unspecified: Secondary | ICD-10-CM | POA: Diagnosis not present

## 2022-12-02 DIAGNOSIS — R4189 Other symptoms and signs involving cognitive functions and awareness: Secondary | ICD-10-CM | POA: Diagnosis not present

## 2022-12-02 DIAGNOSIS — J439 Emphysema, unspecified: Secondary | ICD-10-CM | POA: Diagnosis not present

## 2022-12-02 DIAGNOSIS — J440 Chronic obstructive pulmonary disease with acute lower respiratory infection: Secondary | ICD-10-CM | POA: Diagnosis not present

## 2022-12-02 DIAGNOSIS — F429 Obsessive-compulsive disorder, unspecified: Secondary | ICD-10-CM | POA: Diagnosis not present

## 2022-12-02 DIAGNOSIS — D649 Anemia, unspecified: Secondary | ICD-10-CM | POA: Diagnosis not present

## 2022-12-02 DIAGNOSIS — M5126 Other intervertebral disc displacement, lumbar region: Secondary | ICD-10-CM | POA: Diagnosis not present

## 2022-12-02 DIAGNOSIS — G2581 Restless legs syndrome: Secondary | ICD-10-CM | POA: Diagnosis not present

## 2022-12-02 DIAGNOSIS — N400 Enlarged prostate without lower urinary tract symptoms: Secondary | ICD-10-CM | POA: Diagnosis not present

## 2022-12-02 DIAGNOSIS — M1990 Unspecified osteoarthritis, unspecified site: Secondary | ICD-10-CM | POA: Diagnosis not present

## 2022-12-02 DIAGNOSIS — F319 Bipolar disorder, unspecified: Secondary | ICD-10-CM | POA: Diagnosis not present

## 2022-12-02 DIAGNOSIS — K219 Gastro-esophageal reflux disease without esophagitis: Secondary | ICD-10-CM | POA: Diagnosis not present

## 2022-12-02 DIAGNOSIS — E43 Unspecified severe protein-calorie malnutrition: Secondary | ICD-10-CM | POA: Diagnosis not present

## 2022-12-02 DIAGNOSIS — Z85118 Personal history of other malignant neoplasm of bronchus and lung: Secondary | ICD-10-CM | POA: Diagnosis not present

## 2022-12-02 DIAGNOSIS — Z556 Problems related to health literacy: Secondary | ICD-10-CM | POA: Diagnosis not present

## 2022-12-02 DIAGNOSIS — Z87891 Personal history of nicotine dependence: Secondary | ICD-10-CM | POA: Diagnosis not present

## 2022-12-02 DIAGNOSIS — I214 Non-ST elevation (NSTEMI) myocardial infarction: Secondary | ICD-10-CM | POA: Diagnosis not present

## 2022-12-02 DIAGNOSIS — Z7982 Long term (current) use of aspirin: Secondary | ICD-10-CM | POA: Diagnosis not present

## 2022-12-02 DIAGNOSIS — Z7951 Long term (current) use of inhaled steroids: Secondary | ICD-10-CM | POA: Diagnosis not present

## 2022-12-02 DIAGNOSIS — J4489 Other specified chronic obstructive pulmonary disease: Secondary | ICD-10-CM | POA: Diagnosis not present

## 2022-12-04 ENCOUNTER — Encounter: Payer: Self-pay | Admitting: Family Medicine

## 2022-12-04 ENCOUNTER — Ambulatory Visit
Admission: RE | Admit: 2022-12-04 | Discharge: 2022-12-04 | Disposition: A | Discharge status: Home / Self Care | Payer: PPO | Source: Ambulatory Visit | Attending: Family Medicine | Admitting: Family Medicine

## 2022-12-04 ENCOUNTER — Ambulatory Visit: Payer: PPO | Admitting: Family Medicine

## 2022-12-04 VITALS — BP 108/60 | HR 75 | Resp 18 | Wt 134.0 lb

## 2022-12-04 DIAGNOSIS — R29898 Other symptoms and signs involving the musculoskeletal system: Secondary | ICD-10-CM

## 2022-12-04 DIAGNOSIS — J449 Chronic obstructive pulmonary disease, unspecified: Secondary | ICD-10-CM

## 2022-12-04 DIAGNOSIS — J189 Pneumonia, unspecified organism: Secondary | ICD-10-CM

## 2022-12-04 DIAGNOSIS — I2510 Atherosclerotic heart disease of native coronary artery without angina pectoris: Secondary | ICD-10-CM | POA: Diagnosis not present

## 2022-12-04 DIAGNOSIS — I6990 Unspecified sequelae of unspecified cerebrovascular disease: Secondary | ICD-10-CM | POA: Diagnosis not present

## 2022-12-04 DIAGNOSIS — Z85118 Personal history of other malignant neoplasm of bronchus and lung: Secondary | ICD-10-CM

## 2022-12-04 DIAGNOSIS — I679 Cerebrovascular disease, unspecified: Secondary | ICD-10-CM | POA: Diagnosis not present

## 2022-12-04 DIAGNOSIS — R569 Unspecified convulsions: Secondary | ICD-10-CM | POA: Diagnosis not present

## 2022-12-04 NOTE — Patient Instructions (Signed)
Please review the attached list of medications and notify my office if there are any errors.   Go to [facility] at [facility] for your X-rays (phone no. [phone])

## 2022-12-04 NOTE — Progress Notes (Signed)
Established patient visit   Patient: [name]   DOB: [birth year]   77 y.o. Male  MRN: [id number] Visit Date: 12/04/2022  Today's healthcare provider: Mila Merry, MD   Chief Complaint  Patient presents with   Hospitalization Follow-up   Subjective    Discussed the use of AI scribe software for clinical note transcription with the patient, who gave verbal consent to proceed.  History of Present Illness   The patient presents for follow hospitalization 11-17 through 11-22 with presenting symptoms of profound weakness and inability to walk.  ER workup was remarkable for elevated troponin levels and patchy lung infiltrates on chest CT.  Dr. Kirke Corin was consulted for cardiac evaluation and felt that he was not having coronary event since Troponin trends were flat and he had no ischemic symptoms. He was treated with IV abx and IVF for presumed pneumonia and discharged on Augmentin for three days and Mucinex for five days. He has been received home PT since discharge. Prior to hospitalization, the patient experienced difficulty getting up from a seated position and reported feeling weak. The patient's spouse also reports that the patient was wheezing. The patient's appetite has reportedly improved since returning home from the hospital. She also reports that the patient is using an inhaler, which seems to be helping. The patient's oxygen level was checked and found to be satisfactory.   He also has history of seizure disorder and tremor for which he saw Dr. Sherryll Burger last year and is in process of scheduling a follow up.       Medications: Outpatient Medications Prior to Visit  Medication Sig   aspirin 81 MG tablet Take 1 tablet (81 mg total) by mouth daily.   cholecalciferol (VITAMIN D3) 25 MCG (1000 UT) tablet Take 1,000 Units by mouth daily.   Coenzyme Q10 (COQ10) 100 MG CAPS Take 100 mg by mouth daily.    divalproex (DEPAKOTE ER) 500 MG 24 hr tablet Take 1,000 mg by mouth at  bedtime.    feeding supplement (ENSURE ENLIVE / ENSURE PLUS) LIQD Take 237 mLs by mouth 2 (two) times daily between meals.   guaiFENesin (MUCINEX) 600 MG 12 hr tablet Take 1 tablet (600 mg total) by mouth 2 (two) times daily for 5 days.   omeprazole (PRILOSEC) 40 MG capsule TAKE 1 CAPSULE BY MOUTH EVERY DAY   simvastatin (ZOCOR) 40 MG tablet TAKE 1 TABLET BY MOUTH EVERY DAY AT 6PM   umeclidinium bromide (INCRUSE ELLIPTA) 62.5 MCG/ACT AEPB Inhale 1 puff into the lungs daily.   vitamin B-12 (CYANOCOBALAMIN) 1000 MCG tablet Take 1,000 mcg by mouth daily.   ziprasidone (GEODON) 40 MG capsule Take 40 mg by mouth daily with supper.    No facility-administered medications prior to visit.        Objective    BP 108/60 (BP Location: Left Arm, Patient Position: Sitting, Cuff Size: Normal)   Pulse 75   Resp 18   Wt 134 lb (60.8 kg)   BMI 19.23 kg/m   Physical Exam   VITALS: SaO2- 95 CHEST: Lungs clear to auscultation. CARDIOVASCULAR: Heart sounds normal on auscultation.    No results found for any visits on 12/04/22.  Assessment & Plan       Pneumonia Recent hospitalization for pneumonia with associated weakness. Has finished abx and sx nearly resolved. . No current respiratory symptoms reported. -Order chest X-ray to ensure resolution of infiltrates seen on CT. [name]  History of lung cancer - Ensure  clearing of infiltrates on recent imaging studies  Mobility issues Difficulty walking, possibly secondary to recent illness. Physical therapy initiated. -Continue with physical therapy as planned.   Weakness, tremors, history seizures.  - Previously followed by Dr. Sherryll Burger in need of referral for follow up.    No follow-ups on file.      Mila Merry, MD  [facility] [phone] (phone) [phone] (fax)  [facility]

## 2022-12-05 ENCOUNTER — Telehealth: Payer: Self-pay

## 2022-12-05 NOTE — Telephone Encounter (Signed)
Copied from CRM [id number]. Topic: Quick Communication - Home Health Verbal Orders >> Dec 05, 2022 10:00 AM Clide Dales wrote: Caller/Agency: [facility] Callback Number: [phone] Service Requested: Occupational Therapy Frequency: 1w6 Any new concerns about the patient? No

## 2022-12-05 NOTE — Telephone Encounter (Signed)
That's fine

## 2022-12-06 NOTE — Telephone Encounter (Signed)
Left VM for [name] to return call to office in regards to verbal orders. PEC - you can authorize per Dr. Theodis Aguas note below if [name] returns call. Thank youl.

## 2022-12-08 ENCOUNTER — Other Ambulatory Visit: Payer: Self-pay

## 2022-12-08 NOTE — Patient Outreach (Signed)
Care Management  Transitions of Care Program Transitions of Care Post-discharge week 2   12/08/2022 Name: [name] MRN: [id number] DOB: 1945/03/07  Subjective: [name] is a 77 y.o. year old male who is a primary care patient of Fisher, Demetrios Isaacs, MD. The Care Management team Engaged with patient Engaged with patient by telephone to assess and address transitions of care needs.   Consent to Services:  Patient was given information about care management services, agreed to services, and gave verbal consent to participate.   Assessment:  Outreach today to the patient and his sister answers the phone and communicates for him because he is [facility] despite hearing aids.She states he is till feeling weaker but HHPT has evaluated and will start working wit him to increase his strength. He drive short distances and he is independent with ADL's. He went to his follow up appointment with Dr. Sherrie Mustache and CXR ws ordered. No additional appointments scheduled. He is managing his breathing and Bipolar. Discussed Palliative Care with the sister as she did not understand the meaning. This is not an active pursuit at this time.  SDOH Interventions    Flowsheet Row Patient Outreach from 12/08/2022 in [place] POPULATION HEALTH DEPARTMENT Telephone from 12/01/2022 in [place] POPULATION HEALTH DEPARTMENT  SDOH Interventions    Food Insecurity Interventions Intervention Not Indicated Intervention Not Indicated  Housing Interventions Intervention Not Indicated Intervention Not Indicated  Transportation Interventions Intervention Not Indicated Intervention Not Indicated  Utilities Interventions Intervention Not Indicated Intervention Not Indicated        Goals Addressed             This Visit's Progress    TOC Care Plan   On track    Current Barriers:  Knowledge Deficits related to plan of care for management of COPD, Bipolar Disorder, and Generalized Weakness  Chronic Disease  Management support and education needs related to COPD, Bipolar Disorder, and Generalized Weakness   RNCM Clinical Goal(s):  Patient will work with the Care Management team over the next 30 days to address Transition of Care Barriers: Medication Management Diet/Nutrition/Food Resources Support at home Provider appointments Home Health services Functional/Safety verbalize basic understanding of  COPD, Bipolar Disorder, and Generalized weakness disease process and self health management plan as evidenced by no re-admission to the hospital in the next 30 days take all medications exactly as prescribed and will call provider for medication related questions as evidenced by verbal feedback for the caregiver on what the medicine is used for continue to work with RN Care Manager to address care management and care coordination needs related to  COPD, Bipolar Disorder, and Generalized weakness as evidenced by adherence to CM Team Scheduled appointments through collaboration with RN Care manager, provider, and care team.   Interventions: Evaluation of current treatment plan related to  self management and patient's adherence to plan as established by provider   Generalized Weakness  (Status:  New goal. and Goal on track:  Yes.)  Short Term Goal Evaluation of current treatment plan related to COPD and Bipolar Disorder, Level of care concerns and Mental Health Concerns  self-management and patient's adherence to plan as established by provider. Discussed plans with patient for ongoing care management follow up and provided patient with direct contact information for care management team Evaluation of current treatment plan related to Bipolar and Weakness and patient's adherence to plan as established by provider Provided education to patient and/or caregiver about advanced directives Provided education to patient  re: Palliative Care, Goals of Care  Weight Loss Interventions:  (Status:  New goal.) Short  Term Goal Advised patient to discuss with primary care provider options regarding weight management Screening for signs and symptoms of depression Assessed social determinant of health barriers Nutritional Supplement such as Ensure    Patient Goals/Self-Care Activities: Participate in Transition of Care Program/Attend [facility] scheduled calls Notify RN Care Manager of [facility] call rescheduling needs Take all medications as prescribed Attend all scheduled provider appointments Attend church or other social activities Perform all self care activities independently  Call provider office for new concerns or questions   Follow Up Plan:  Telephone follow up appointment with care management team member scheduled for:  Friday December 6th at 2:15pm         Plan: Reviewed goals for care. Please refer to Care Plan for goals and interventions.  Effectiveness of interventions, symptom management and outcomes will be evaluated weekly. Patient educated on red flags s/s to watch for and was encouraged to report any of these identified, any new symptoms, changes in baseline or medication regimen, change in health status / well-being, or safety concerns to PCP and / or the [facility] Case Management team.   Routine follow-up and on-going assessment evaluation and education of disease processes, recommended interventions for both chronic and acute medical conditions, will occur during each weekly visit during [facility] 30-day Program Outreach calls along with ongoing review of symptoms, medication reviews and reconciliation. Any updates, inconsistencies, discrepancies or acute care concerns will be addressed on the Care Plan and routed to the correct Practitioner if indicated.    The patient has been provided with contact information for the care management team and has been advised to call with any health-related questions or concerns. The patient verbalized understanding with current POC. The patient is directed to their  insurance card regarding availability of benefits coverage.   Deidre Ala, RN Medical illustrator [facility]Population [facility] [phone]

## 2022-12-08 NOTE — Patient Instructions (Signed)
Visit Information  Thank you for taking time to visit with me today. Please don't hesitate to contact me if I can be of assistance to you before our next scheduled telephone appointment.  Following is a copy of your care plan:   Goals Addressed             This Visit's Progress    TOC Care Plan   On track    Current Barriers:  Knowledge Deficits related to plan of care for management of COPD, Bipolar Disorder, and Generalized Weakness  Chronic Disease Management support and education needs related to COPD, Bipolar Disorder, and Generalized Weakness   RNCM Clinical Goal(s):  Patient will work with the Care Management team over the next 30 days to address Transition of Care Barriers: Medication Management Diet/Nutrition/Food Resources Support at home Provider appointments Home Health services Functional/Safety verbalize basic understanding of  COPD, Bipolar Disorder, and Generalized weakness disease process and self health management plan as evidenced by no re-admission to the hospital in the next 30 days take all medications exactly as prescribed and will call provider for medication related questions as evidenced by verbal feedback for the caregiver on what the medicine is used for continue to work with RN Care Manager to address care management and care coordination needs related to  COPD, Bipolar Disorder, and Generalized weakness as evidenced by adherence to CM Team Scheduled appointments through collaboration with RN Care manager, provider, and care team.   Interventions: Evaluation of current treatment plan related to  self management and patient's adherence to plan as established by provider   Generalized Weakness  (Status:  New goal. and Goal on track:  Yes.)  Short Term Goal Evaluation of current treatment plan related to COPD and Bipolar Disorder, Level of care concerns and Mental Health Concerns  self-management and patient's adherence to plan as established by  provider. Discussed plans with patient for ongoing care management follow up and provided patient with direct contact information for care management team Evaluation of current treatment plan related to Bipolar and Weakness and patient's adherence to plan as established by provider Provided education to patient and/or caregiver about advanced directives Provided education to patient re: Palliative Care, Goals of Care  Weight Loss Interventions:  (Status:  New goal.) Short Term Goal Advised patient to discuss with primary care provider options regarding weight management Screening for signs and symptoms of depression Assessed social determinant of health barriers Nutritional Supplement such as Ensure    Patient Goals/Self-Care Activities: Participate in Transition of Care Program/Attend [facility] scheduled calls Notify RN Care Manager of [facility] call rescheduling needs Take all medications as prescribed Attend all scheduled provider appointments Attend church or other social activities Perform all self care activities independently  Call provider office for new concerns or questions   Follow Up Plan:  Telephone follow up appointment with care management team member scheduled for:  Friday December 6th at 2:15pm          Patient verbalizes understanding of instructions and care plan provided today and agrees to view in MyChart. Active MyChart status and patient understanding of how to access instructions and care plan via MyChart confirmed with patient.     Telephone follow up appointment with care management team member scheduled for: Friday December 6th at 2:15pm  Please call the care guide team at [phone] if you need to cancel or reschedule your appointment.   Please call the Suicide and Crisis Lifeline: 988 call the [place] National Suicide Prevention Lifeline:  (681) 600-7763 or TTY: 715-635-2183 TTY 626-704-6901) to talk to a trained counselor if you are experiencing a Mental Health or  Behavioral Health Crisis or need someone to talk to. Deidre Ala, RN Medical illustrator [facility]Population [facility] [phone]

## 2022-12-12 DIAGNOSIS — J439 Emphysema, unspecified: Secondary | ICD-10-CM | POA: Diagnosis not present

## 2022-12-12 DIAGNOSIS — N400 Enlarged prostate without lower urinary tract symptoms: Secondary | ICD-10-CM | POA: Diagnosis not present

## 2022-12-12 DIAGNOSIS — J440 Chronic obstructive pulmonary disease with acute lower respiratory infection: Secondary | ICD-10-CM | POA: Diagnosis not present

## 2022-12-12 DIAGNOSIS — F319 Bipolar disorder, unspecified: Secondary | ICD-10-CM | POA: Diagnosis not present

## 2022-12-12 DIAGNOSIS — J189 Pneumonia, unspecified organism: Secondary | ICD-10-CM | POA: Diagnosis not present

## 2022-12-12 DIAGNOSIS — F429 Obsessive-compulsive disorder, unspecified: Secondary | ICD-10-CM | POA: Diagnosis not present

## 2022-12-12 DIAGNOSIS — D649 Anemia, unspecified: Secondary | ICD-10-CM | POA: Diagnosis not present

## 2022-12-12 DIAGNOSIS — M1990 Unspecified osteoarthritis, unspecified site: Secondary | ICD-10-CM | POA: Diagnosis not present

## 2022-12-12 DIAGNOSIS — J4489 Other specified chronic obstructive pulmonary disease: Secondary | ICD-10-CM | POA: Diagnosis not present

## 2022-12-12 DIAGNOSIS — R4189 Other symptoms and signs involving cognitive functions and awareness: Secondary | ICD-10-CM | POA: Diagnosis not present

## 2022-12-12 DIAGNOSIS — I214 Non-ST elevation (NSTEMI) myocardial infarction: Secondary | ICD-10-CM | POA: Diagnosis not present

## 2022-12-12 DIAGNOSIS — E43 Unspecified severe protein-calorie malnutrition: Secondary | ICD-10-CM | POA: Diagnosis not present

## 2022-12-15 ENCOUNTER — Telehealth: Payer: Self-pay

## 2022-12-15 ENCOUNTER — Other Ambulatory Visit: Payer: Self-pay

## 2022-12-15 NOTE — Patient Instructions (Signed)
Visit Information  Thank you for taking time to visit with me today. Please don't hesitate to contact me if I can be of assistance to you before our next scheduled telephone appointment.  Following is a copy of your care plan:   Goals Addressed             This Visit's Progress    TOC Care Plan   On track    Current Barriers:  Knowledge Deficits related to plan of care for management of COPD, Bipolar Disorder, and Generalized Weakness  Chronic Disease Management support and education needs related to COPD, Bipolar Disorder, and Generalized Weakness   RNCM Clinical Goal(s):  Patient will work with the Care Management team over the next 30 days to address Transition of Care Barriers: Medication Management Diet/Nutrition/Food Resources Support at home Provider appointments Home Health services Functional/Safety verbalize basic understanding of  COPD, Bipolar Disorder, and Generalized weakness disease process and self health management plan as evidenced by no re-admission to the hospital in the next 30 days take all medications exactly as prescribed and will call provider for medication related questions as evidenced by verbal feedback for the caregiver on what the medicine is used for continue to work with RN Care Manager to address care management and care coordination needs related to  COPD, Bipolar Disorder, and Generalized weakness as evidenced by adherence to CM Team Scheduled appointments through collaboration with RN Care manager, provider, and care team.   Interventions: Evaluation of current treatment plan related to  self management and patient's adherence to plan as established by provider   Generalized Weakness  (Status:  New goal. and Goal on track:  Yes.)  Short Term Goal Evaluation of current treatment plan related to COPD and Bipolar Disorder, Level of care concerns and Mental Health Concerns  self-management and patient's adherence to plan as established by  provider. Discussed plans with patient for ongoing care management follow up and provided patient with direct contact information for care management team Evaluation of current treatment plan related to Bipolar and Weakness and patient's adherence to plan as established by provider Provided education to patient and/or caregiver about advanced directives Provided education to patient re: Palliative Care, Goals of Care  Weight Loss Interventions:  (Status:  New goal. and Goal on track:  Yes.) Short Term Goal Advised patient to discuss with primary care provider options regarding weight management Screening for signs and symptoms of depression Assessed social determinant of health barriers Nutritional Supplement such as Ensure    Patient Goals/Self-Care Activities: Participate in Transition of Care Program/Attend [facility] scheduled calls Notify RN Care Manager of [facility] call rescheduling needs Take all medications as prescribed Attend all scheduled provider appointments Attend church or other social activities Perform all self care activities independently  Call provider office for new concerns or questions   Follow Up Plan:  Telephone follow up appointment with care management team member scheduled for:  Friday December 13th at 2:15pm          Patient verbalizes understanding of instructions and care plan provided today and agrees to view in MyChart. Active MyChart status and patient understanding of how to access instructions and care plan via MyChart confirmed with patient.     Telephone follow up appointment with care management team member scheduled for: Friday December 13th at 1:15pm  Please call the care guide team at [phone] if you need to cancel or reschedule your appointment.   Please call the Suicide and Crisis Lifeline: 988 call  the [place] National Suicide Prevention Lifeline: 725-833-9056 or TTY: (714)186-1733 TTY [phone]) to talk to a trained counselor if you are  experiencing a Mental Health or Behavioral Health Crisis or need someone to talk to.  Deidre Ala, RN Medical illustrator [facility] [phone]

## 2022-12-15 NOTE — Patient Outreach (Signed)
Care Management  Transitions of Care Program Transitions of Care Post-discharge week 3   12/15/2022 Name: [name] MRN: [id number] DOB: [birth year]  Subjective: [name] is a 77 y.o. year old male who is a primary care patient of Fisher, Demetrios Isaacs, MD. The Care Management team Engaged with patient Engaged with patient by telephone to assess and address transitions of care needs.   Consent to Services:  Patient was given information about care management services, agreed to services, and gave verbal consent to participate.   Assessment: TOC week 3 Outreach completed. The patient has started with HHPT and states he had a good work out with them. He continues to recover from Pneumonia but is getting back to baseline. He is driving and going out to breakfast which is his normal routine. His sister checks on him daily. The patient is independent with ADL's and medications. RNCM will conduct Outreach to the patient next week and will offer transfer to Longitudinal Care.   SDOH Interventions    Flowsheet Row Telephone from 12/15/2022 in [place] POPULATION HEALTH DEPARTMENT Patient Outreach from 12/08/2022 in [place] POPULATION HE[facility] DEPARTMENT Telephone from 12/01/2022 in  POPULATION HEALTH DEPARTMENT  SDOH Interventions     Food Insecurity Interventions Intervention Not Indicated Intervention Not Indicated Intervention Not Indicated  Housing Interventions Intervention Not Indicated Intervention Not Indicated Intervention Not Indicated  Transportation Interventions Intervention Not Indicated Intervention Not Indicated Intervention Not Indicated  Utilities Interventions Intervention Not Indicated Intervention Not Indicated Intervention Not Indicated        Goals Addressed             This Visit's Progress    TOC Care Plan   On track    Current Barriers:  Knowledge Deficits related to plan of care for management of COPD, Bipolar Disorder, and Generalized  Weakness  Chronic Disease Management support and education needs related to COPD, Bipolar Disorder, and Generalized Weakness   RNCM Clinical Goal(s):  Patient will work with the Care Management team over the next 30 days to address Transition of Care Barriers: Medication Management Diet/Nutrition/Food Resources Support at home Provider appointments Home Health services Functional/Safety verbalize basic understanding of  COPD, Bipolar Disorder, and Generalized weakness disease process and self health management plan as evidenced by no re-admission to the hospital in the next 30 days take all medications exactly as prescribed and will call provider for medication related questions as evidenced by verbal feedback for the caregiver on what the medicine is used for continue to work with RN Care Manager to address care management and care coordination needs related to  COPD, Bipolar Disorder, and Generalized weakness as evidenced by adherence to CM Team Scheduled appointments through collaboration with RN Care manager, provider, and care team.   Interventions: Evaluation of current treatment plan related to  self management and patient's adherence to plan as established by provider   Generalized Weakness  (Status:  New goal. and Goal on track:  Yes.)  Short Term Goal Evaluation of current treatment plan related to COPD and Bipolar Disorder, Level of care concerns and Mental Health Concerns  self-management and patient's adherence to plan as established by provider. Discussed plans with patient for ongoing care management follow up and provided patient with direct contact information for care management team Evaluation of current treatment plan related to Bipolar and Weakness and patient's adherence to plan as established by provider Provided education to patient and/or caregiver about advanced directives Provided education to patient re:  Palliative Care, Goals of Care  Weight Loss Interventions:   (Status:  New goal. and Goal on track:  Yes.) Short Term Goal Advised patient to discuss with primary care provider options regarding weight management Screening for signs and symptoms of depression Assessed social determinant of health barriers Nutritional Supplement such as Ensure    Patient Goals/Self-Care Activities: Participate in Transition of Care Program/Attend [facility] scheduled calls Notify RN Care Manager of [facility] call rescheduling needs Take all medications as prescribed Attend all scheduled provider appointments Attend church or other social activities Perform all self care activities independently  Call provider office for new concerns or questions   Follow Up Plan:  Telephone follow up appointment with care management team member scheduled for:  Friday December 13th at 2:15pm         Please refer to Care Plan for goals and interventions.  Effectiveness of interventions, symptom management and outcomes will be evaluated weekly. Patient educated on red flags s/s to watch for and was encouraged to report any of these identified, any new symptoms, changes in baseline or medication regimen, change in health status / well-being, or safety concerns to PCP and / or the [facility] Case Management team.   Routine follow-up and on-going assessment evaluation and education of disease processes, and recommended interventions for both chronic and acute medical conditions, will occur during each weekly visit during [facility] 30-day Program Outreach calls along with ongoing review of symptoms, medication reviews and reconciliation. Any updates, inconsistencies, discrepancies or acute care concerns will be addressed on the Care Plan and routed to the correct Practitioner if indicated.    The patient has been provided with contact information for the care management team and has been advised to call with any health-related questions or concerns. The patient verbalized understanding with current POC. The patient  is directed to their insurance card regarding availability of benefits coverage.  Deidre Ala, RN Medical illustrator [facility] [phone]

## 2022-12-18 NOTE — Telephone Encounter (Signed)
Detailed VM left for verbal orders

## 2022-12-19 DIAGNOSIS — R251 Tremor, unspecified: Secondary | ICD-10-CM | POA: Diagnosis not present

## 2022-12-19 DIAGNOSIS — R29898 Other symptoms and signs involving the musculoskeletal system: Secondary | ICD-10-CM | POA: Diagnosis not present

## 2022-12-19 DIAGNOSIS — R200 Anesthesia of skin: Secondary | ICD-10-CM | POA: Diagnosis not present

## 2022-12-19 DIAGNOSIS — R7309 Other abnormal glucose: Secondary | ICD-10-CM | POA: Diagnosis not present

## 2022-12-22 ENCOUNTER — Other Ambulatory Visit: Payer: Self-pay

## 2022-12-22 DIAGNOSIS — H353231 Exudative age-related macular degeneration, bilateral, with active choroidal neovascularization: Secondary | ICD-10-CM | POA: Diagnosis not present

## 2022-12-22 NOTE — Patient Instructions (Signed)
Visit Information  Thank you for taking time to visit with me today. Please don't hesitate to contact me if I can be of assistance to you before our next scheduled telephone appointment.  Following is a copy of your care plan:   Goals Addressed             This Visit's Progress    COMPLETED: TOC Care Plan       Current Barriers:  Knowledge Deficits related to plan of care for management of COPD, Bipolar Disorder, and Generalized Weakness  Chronic Disease Management support and education needs related to COPD, Bipolar Disorder, and Generalized Weakness   RNCM Clinical Goal(s):  Patient will work with the Care Management team over the next 30 days to address Transition of Care Barriers: Medication Management Diet/Nutrition/Food Resources Support at home Provider appointments Home Health services Functional/Safety verbalize basic understanding of  COPD, Bipolar Disorder, and Generalized weakness disease process and self health management plan as evidenced by no re-admission to the hospital in the next 30 days take all medications exactly as prescribed and will call provider for medication related questions as evidenced by verbal feedback for the caregiver on what the medicine is used for continue to work with RN Care Manager to address care management and care coordination needs related to  COPD, Bipolar Disorder, and Generalized weakness as evidenced by adherence to CM Team Scheduled appointments through collaboration with RN Care manager, provider, and care team.   Interventions: Evaluation of current treatment plan related to  self management and patient's adherence to plan as established by provider   Generalized Weakness  (Status:  New goal. and Goal on track:  Yes.)  Short Term Goal Evaluation of current treatment plan related to COPD and Bipolar Disorder, Level of care concerns and Mental Health Concerns  self-management and patient's adherence to plan as established by  provider. Discussed plans with patient for ongoing care management follow up and provided patient with direct contact information for care management team Evaluation of current treatment plan related to Bipolar and Weakness and patient's adherence to plan as established by provider Provided education to patient and/or caregiver about advanced directives Provided education to patient re: Palliative Care, Goals of Care  Weight Loss Interventions:  (Status:  New goal. and Goal on track:  Yes.) Short Term Goal Advised patient to discuss with primary care provider options regarding weight management Screening for signs and symptoms of depression Assessed social determinant of health barriers Nutritional Supplement such as Ensure    Patient Goals/Self-Care Activities: Participate in Transition of Care Program/Attend TOC scheduled calls Notify RN Care Manager of TOC call rescheduling needs Take all medications as prescribed Attend all scheduled provider appointments Attend church or other social activities Perform all self care activities independently  Call provider office for new concerns or questions   Follow Up Plan:  Completed the 30 day TOC Outreach        Patient verbalizes understanding of instructions and care plan provided today and agrees to view in MyChart. Active MyChart status and patient understanding of how to access instructions and care plan via MyChart confirmed with patient.     Please call the care guide team at [phone] if you need to cancel or reschedule your appointment.   Please call the Suicide and Crisis Lifeline: 988 call the [place] National Suicide Prevention Lifeline: 720-201-5180 or TTY: 418 294 9229 TTY [phone]) to talk to a trained counselor if you are experiencing a Mental Health or Behavioral Health Crisis or  need someone to talk to.  Deidre Ala, RN Medical illustrator [facility] [phone]

## 2022-12-22 NOTE — Patient Outreach (Signed)
Care Management  Transitions of Care Program Transitions of Care Post-discharge week 4   12/22/2022 Name: [name] MRN: [id number] DOB: 01/24/1945  Subjective: [name] is a 77 y.o. year old male who is a primary care patient of Fisher, Demetrios Isaacs, MD. The Care Management team Engaged with patient Engaged with patient by telephone to assess and address transitions of care needs.   Consent to Services:  Patient was given information about care management services, agreed to services, and gave verbal consent to participate.   Assessment: TOC Outreach #4 today. The patient continues with HHPT and states that he really gets a workout from the PT and his legs are a little sore. Feeling stronger. He has no concerns. He has recovered from his Pneumonia. He declines transfer to the Longitudinal CCM team.   SDOH Interventions    Flowsheet Row Telephone from 12/15/2022 in [place] POPULATION HEALTH DEPARTMENT Patient Outreach from 12/08/2022 in [place] POPULATION HEALTH DEPARTMENT Telephone from 12/01/2022 in  POPULATION HEALTH DEPARTMENT  SDOH Interventions     Food Insecurity Interventions Intervention Not Indicated Intervention Not Indicated Intervention Not Indicated  Housing Interventions Intervention Not Indicated Intervention Not Indicated Intervention Not Indicated  Transportation Interventions Intervention Not Indicated Intervention Not Indicated Intervention Not Indicated  Utilities Interventions Intervention Not Indicated Intervention Not Indicated Intervention Not Indicated        Goals Addressed             This Visit's Progress    COMPLETED: TOC Care Plan       Current Barriers:  Knowledge Deficits related to plan of care for management of COPD, Bipolar Disorder, and Generalized Weakness  Chronic Disease Management support and education needs related to COPD, Bipolar Disorder, and Generalized Weakness   RNCM Clinical Goal(s):  Patient will  work with the Care Management team over the next 30 days to address Transition of Care Barriers: Medication Management Diet/Nutrition/Food Resources Support at home Provider appointments Home Health services Functional/Safety verbalize basic understanding of  COPD, Bipolar Disorder, and Generalized weakness disease process and self health management plan as evidenced by no re-admission to the hospital in the next 30 days take all medications exactly as prescribed and will call provider for medication related questions as evidenced by verbal feedback for the caregiver on what the medicine is used for continue to work with RN Care Manager to address care management and care coordination needs related to  COPD, Bipolar Disorder, and Generalized weakness as evidenced by adherence to CM Team Scheduled appointments through collaboration with RN Care manager, provider, and care team.   Interventions: Evaluation of current treatment plan related to  self management and patient's adherence to plan as established by provider   Generalized Weakness  (Status:  New goal. and Goal on track:  Yes.)  Short Term Goal Evaluation of current treatment plan related to COPD and Bipolar Disorder, Level of care concerns and Mental Health Concerns  self-management and patient's adherence to plan as established by provider. Discussed plans with patient for ongoing care management follow up and provided patient with direct contact information for care management team Evaluation of current treatment plan related to Bipolar and Weakness and patient's adherence to plan as established by provider Provided education to patient and/or caregiver about advanced directives Provided education to patient re: Palliative Care, Goals of Care  Weight Loss Interventions:  (Status:  New goal. and Goal on track:  Yes.) Short Term Goal Advised patient to discuss with primary  care provider options regarding weight management Screening for  signs and symptoms of depression Assessed social determinant of health barriers Nutritional Supplement such as Ensure    Patient Goals/Self-Care Activities: Participate in Transition of Care Program/Attend TOC scheduled calls Notify RN Care Manager of TOC call rescheduling needs Take all medications as prescribed Attend all scheduled provider appointments Attend church or other social activities Perform all self care activities independently  Call provider office for new concerns or questions   Follow Up Plan:  Completed the 30 day TOC Outreach       The patient has been provided with contact information for the care management team and has been advised to call with any health-related questions or concerns. The patient verbalized understanding with current POC. The patient is directed to their insurance card regarding availability of benefits coverage.  Deidre Ala, RN Medical illustrator [facility] [phone]

## 2023-01-15 DIAGNOSIS — F3172 Bipolar disorder, in full remission, most recent episode hypomanic: Secondary | ICD-10-CM | POA: Diagnosis not present

## 2023-01-16 LAB — LAB REPORT - SCANNED: EGFR: 48

## 2023-01-18 ENCOUNTER — Telehealth: Payer: Self-pay | Admitting: Family Medicine

## 2023-01-18 DIAGNOSIS — F3172 Bipolar disorder, in full remission, most recent episode hypomanic: Secondary | ICD-10-CM | POA: Diagnosis not present

## 2023-01-18 NOTE — Telephone Encounter (Signed)
 Patient is scheduled to see Janna tomorrow, 01/19/23, at 3:20

## 2023-01-18 NOTE — Telephone Encounter (Signed)
 Please advise patient he needs to schedule appointment with me in the next for follow up on labs done by Dr. Maryruth Bun. He can have a sameday appointment slot.  If he is feeling weak or sick then he should go ahead and go to urgent care.

## 2023-01-19 ENCOUNTER — Ambulatory Visit (INDEPENDENT_AMBULATORY_CARE_PROVIDER_SITE_OTHER): Payer: PPO | Admitting: Physician Assistant

## 2023-01-19 ENCOUNTER — Ambulatory Visit
Admission: RE | Admit: 2023-01-19 | Discharge: 2023-01-19 | Disposition: A | Discharge status: Home / Self Care | Payer: PPO | Source: Ambulatory Visit | Attending: Physician Assistant | Admitting: Physician Assistant

## 2023-01-19 ENCOUNTER — Encounter: Payer: Self-pay | Admitting: Physician Assistant

## 2023-01-19 ENCOUNTER — Ambulatory Visit
Admission: RE | Admit: 2023-01-19 | Discharge: 2023-01-19 | Disposition: A | Discharge status: Home / Self Care | Payer: PPO | Attending: Physician Assistant | Admitting: Physician Assistant

## 2023-01-19 VITALS — BP 110/67 | HR 81 | Resp 16 | Wt 131.6 lb

## 2023-01-19 DIAGNOSIS — D729 Disorder of white blood cells, unspecified: Secondary | ICD-10-CM | POA: Insufficient documentation

## 2023-01-19 DIAGNOSIS — M79604 Pain in right leg: Secondary | ICD-10-CM

## 2023-01-19 DIAGNOSIS — R300 Dysuria: Secondary | ICD-10-CM

## 2023-01-19 DIAGNOSIS — J449 Chronic obstructive pulmonary disease, unspecified: Secondary | ICD-10-CM | POA: Diagnosis not present

## 2023-01-19 DIAGNOSIS — J439 Emphysema, unspecified: Secondary | ICD-10-CM | POA: Diagnosis not present

## 2023-01-19 DIAGNOSIS — M79605 Pain in left leg: Secondary | ICD-10-CM | POA: Diagnosis not present

## 2023-01-19 DIAGNOSIS — Z8701 Personal history of pneumonia (recurrent): Secondary | ICD-10-CM | POA: Diagnosis not present

## 2023-01-19 DIAGNOSIS — R0602 Shortness of breath: Secondary | ICD-10-CM | POA: Diagnosis not present

## 2023-01-19 LAB — POCT URINALYSIS DIPSTICK
Bilirubin, UA: NEGATIVE
Blood, UA: NEGATIVE
Glucose, UA: NEGATIVE
Ketones, UA: NEGATIVE
Leukocytes, UA: NEGATIVE
Nitrite, UA: NEGATIVE
Protein, UA: NEGATIVE
Spec Grav, UA: 1.015 (ref 1.010–1.025)
Urobilinogen, UA: 0.2 U/dL
pH, UA: 6 (ref 5.0–8.0)

## 2023-01-19 NOTE — Progress Notes (Signed)
 Established patient visit  Patient: [name]   DOB: January 11, 1945   78 y.o. Male  MRN: [id number] Visit Date: 01/19/2023  Today's healthcare provider: Jolynn Spencer, PA-C   Chief Complaint  Patient presents with   Follow-up    Labs results from Dr. Chipper   Subjective     Discussed the use of AI scribe software for clinical note transcription with the patient, who gave verbal consent to proceed.  History of Present Illness   The patient, with a history of pneumonia and bipolar disorder, presents with leg pain and weakness. The patient was referred by another doctor due to concerns about elevated white blood cells and weight loss. However, the patient's weight was found to be higher at this visit than at the previous doctor's office. The patient denies any fatigue and reports that the leg pain is the primary issue. The patient's legs are weak and hurt, but there is no redness, swelling, or wounds. The patient has stopped taking blood pressure medication to see if that was causing the leg pain. The patient was previously hospitalized for pneumonia and had difficulty standing due to severe leg pain. The patient also reports problems with urination.           01/19/2023   12:40 PM 06/14/2022   10:11 AM 05/10/2021   11:49 AM  Depression screen PHQ 2/9  Decreased Interest 0 0 3  Down, Depressed, Hopeless 0 0 0  PHQ - 2 Score 0 0 3  Altered sleeping  1 0  Tired, decreased energy  1 3  Change in appetite  0 2  Feeling bad or failure about yourself   0 0  Trouble concentrating  0 0  Moving slowly or fidgety/restless  0 2  Suicidal thoughts  0 0  PHQ-9 Score  2 10  Difficult doing work/chores  Not difficult at all Extremely dIfficult      01/19/2023   12:40 PM  GAD 7 : Generalized Anxiety Score  Nervous, Anxious, on Edge 0  Control/stop worrying 0  Worry too much - different things 0  Trouble relaxing 0  Restless 0  Easily annoyed or irritable 0  Afraid - awful might happen 0   Total GAD 7 Score 0  Anxiety Difficulty Not difficult at all    Medications: Outpatient Medications Prior to Visit  Medication Sig   aspirin  81 MG tablet Take 1 tablet (81 mg total) by mouth daily.   cholecalciferol (VITAMIN D3) 25 MCG (1000 UT) tablet Take 1,000 Units by mouth daily.   Coenzyme Q10 (COQ10) 100 MG CAPS Take 100 mg by mouth daily.    divalproex  (DEPAKOTE  ER) 500 MG 24 hr tablet Take 1,000 mg by mouth at bedtime.    feeding supplement (ENSURE ENLIVE / ENSURE PLUS) LIQD Take 237 mLs by mouth 2 (two) times daily between meals.   omeprazole  (PRILOSEC) 40 MG capsule TAKE 1 CAPSULE BY MOUTH EVERY DAY   simvastatin  (ZOCOR ) 40 MG tablet TAKE 1 TABLET BY MOUTH EVERY DAY AT 6PM   umeclidinium bromide  (INCRUSE ELLIPTA ) 62.5 MCG/ACT AEPB Inhale 1 puff into the lungs daily.   vitamin B-12 (CYANOCOBALAMIN ) 1000 MCG tablet Take 1,000 mcg by mouth daily.   ziprasidone  (GEODON ) 40 MG capsule Take 40 mg by mouth daily with supper.    No facility-administered medications prior to visit.    Review of Systems All negative Except see HPI       Objective    BP 110/67 (BP Location: Right  Arm, Patient Position: Sitting, Cuff Size: Normal)   Pulse 81   Resp 16   Wt 131 lb 9.6 oz (59.7 kg)   SpO2 99%   BMI 18.88 kg/m     Physical Exam Vitals reviewed.  Constitutional:      General: He is not in acute distress.    Appearance: Normal appearance. He is not diaphoretic.  HENT:     Head: Normocephalic and atraumatic.  Eyes:     General: No scleral icterus.    Conjunctiva/sclera: Conjunctivae normal.  Cardiovascular:     Rate and Rhythm: Normal rate and regular rhythm.     Pulses: Normal pulses.     Heart sounds: Normal heart sounds. No murmur heard. Pulmonary:     Effort: Pulmonary effort is normal. No respiratory distress.     Breath sounds: Normal breath sounds. No wheezing or rhonchi.  Musculoskeletal:     Cervical back: Neck supple.     Right lower leg: No edema.      Left lower leg: No edema.  Lymphadenopathy:     Cervical: No cervical adenopathy.  Skin:    General: Skin is warm and dry.     Findings: No rash.  Neurological:     Mental Status: He is alert and oriented to person, place, and time. Mental status is at baseline.  Psychiatric:        Mood and Affect: Mood normal.        Behavior: Behavior normal.      Results for orders placed or performed in visit on 01/19/23  CBC with Differential/Platelet  Result Value Ref Range   WBC 19.0 (H) 3.4 - 10.8 x10E3/uL   RBC 3.51 (L) 4.14 - 5.80 x10E6/uL   Hemoglobin 12.0 (L) 13.0 - 17.7 g/dL   Hematocrit 64.0 (L) 62.4 - 51.0 %   MCV 102 (H) 79 - 97 fL   MCH 34.2 (H) 26.6 - 33.0 pg   MCHC 33.4 31.5 - 35.7 g/dL   RDW 86.2 88.3 - 84.5 %   Platelets 341 150 - 450 x10E3/uL   Neutrophils 79 Not Estab. %   Lymphs 10 Not Estab. %   Monocytes 8 Not Estab. %   Eos 1 Not Estab. %   Basos 1 Not Estab. %   Neutrophils Absolute 15.1 (H) 1.4 - 7.0 x10E3/uL   Lymphocytes Absolute 1.9 0.7 - 3.1 x10E3/uL   Monocytes Absolute 1.6 (H) 0.1 - 0.9 x10E3/uL   EOS (ABSOLUTE) 0.2 0.0 - 0.4 x10E3/uL   Basophils Absolute 0.1 0.0 - 0.2 x10E3/uL   Immature Granulocytes 1 Not Estab. %   Immature Grans (Abs) 0.1 0.0 - 0.1 x10E3/uL  Comprehensive metabolic panel  Result Value Ref Range   Glucose 60 (L) 70 - 99 mg/dL   BUN 23 8 - 27 mg/dL   Creatinine, Ser 8.81 0.76 - 1.27 mg/dL   eGFR 64 >40 fO/fpw/8.26   BUN/Creatinine Ratio 19 10 - 24   Sodium 144 134 - 144 mmol/L   Potassium 5.1 3.5 - 5.2 mmol/L   Chloride 105 96 - 106 mmol/L   CO2 23 20 - 29 mmol/L   Calcium  9.7 8.6 - 10.2 mg/dL   Total Protein 7.3 6.0 - 8.5 g/dL   Albumin 3.8 3.8 - 4.8 g/dL   Globulin, Total 3.5 1.5 - 4.5 g/dL   Bilirubin Total 0.2 0.0 - 1.2 mg/dL   Alkaline Phosphatase 94 44 - 121 IU/L   AST 19 0 - 40 IU/L   ALT  11 0 - 44 IU/L  POCT urinalysis dipstick  Result Value Ref Range   Color, UA Yellow    Clarity, UA Clear    Glucose, UA  Negative Negative   Bilirubin, UA Negative    Ketones, UA Negative    Spec Grav, UA 1.015 1.010 - 1.025   Blood, UA Negative    pH, UA 6.0 5.0 - 8.0   Protein, UA Negative Negative   Urobilinogen, UA 0.2 0.2 or 1.0 E.U./dL   Nitrite, UA Negative    Leukocytes, UA Negative Negative   Appearance     Odor          Assessment and Plan    Elevated White Blood Cells Recent hospitalization for pneumonia. No current symptoms of infection. -Order CBC, CMP, and urinalysis to investigate cause of elevated white blood cells. -Plan for follow-up appointment next week with Dr. Gasper to discuss results.  Leg Pain and Weakness Chronic leg pain and weakness, possibly related to arthritis. No redness or swelling noted. -Continue monitoring symptoms.  COPD Chronic  No current symptoms of exacerbation. -Continue current inhaler regimen.  Pneumonia (history) Recent hospitalization for pneumonia. No current symptoms. -Order chest x-ray to ensure resolution of pneumonia.  Hypertension Chronic and stable Blood pressure currently controlled. -Continue current management plan, lifestyle modifications encouraged.     Orders Placed This Encounter  Procedures   Urine Culture   DG Chest 2 View    Standing Status:   Future    Number of Occurrences:   1    Expected Date:   01/19/2023    Expiration Date:   02/19/2023    Reason for Exam (SYMPTOM  OR DIAGNOSIS REQUIRED):   recent cause of pneumonia, shortness of breath    Preferred imaging location?:   [facility]   CBC with Differential/Platelet   Comprehensive metabolic panel   POCT urinalysis dipstick    No follow-ups on file.   The patient was advised to call back or seek an in-person evaluation if the symptoms worsen or if the condition fails to improve as anticipated.  I discussed the assessment and treatment plan with the patient. The patient was provided an opportunity to ask questions and all were answered. The patient agreed  with the plan and demonstrated an understanding of the instructions.  I, Reggie Welge, PA-C have reviewed all documentation for this visit. The documentation on 01/19/2023  for the exam, diagnosis, procedures, and orders are all accurate and complete.  Jolynn Spencer, [facility], MMS [facility] [phone] (phone) [phone] (fax)  [facility]

## 2023-01-20 LAB — CBC WITH DIFFERENTIAL/PLATELET
Basophils Absolute: 0.1 10*3/uL (ref 0.0–0.2)
Basos: 1 %
EOS (ABSOLUTE): 0.2 10*3/uL (ref 0.0–0.4)
Eos: 1 %
Hematocrit: 35.9 % — ABNORMAL LOW (ref 37.5–51.0)
Hemoglobin: 12 g/dL — ABNORMAL LOW (ref 13.0–17.7)
Immature Grans (Abs): 0.1 10*3/uL (ref 0.0–0.1)
Immature Granulocytes: 1 %
Lymphocytes Absolute: 1.9 10*3/uL (ref 0.7–3.1)
Lymphs: 10 %
MCH: 34.2 pg — ABNORMAL HIGH (ref 26.6–33.0)
MCHC: 33.4 g/dL (ref 31.5–35.7)
MCV: 102 fL — ABNORMAL HIGH (ref 79–97)
Monocytes Absolute: 1.6 10*3/uL — ABNORMAL HIGH (ref 0.1–0.9)
Monocytes: 8 %
Neutrophils Absolute: 15.1 10*3/uL — ABNORMAL HIGH (ref 1.4–7.0)
Neutrophils: 79 %
Platelets: 341 10*3/uL (ref 150–450)
RBC: 3.51 x10E6/uL — ABNORMAL LOW (ref 4.14–5.80)
RDW: 13.7 % (ref 11.6–15.4)
WBC: 19 10*3/uL — ABNORMAL HIGH (ref 3.4–10.8)

## 2023-01-20 LAB — COMPREHENSIVE METABOLIC PANEL
ALT: 11 [IU]/L (ref 0–44)
AST: 19 [IU]/L (ref 0–40)
Albumin: 3.8 g/dL (ref 3.8–4.8)
Alkaline Phosphatase: 94 [IU]/L (ref 44–121)
BUN/Creatinine Ratio: 19 (ref 10–24)
BUN: 23 mg/dL (ref 8–27)
Bilirubin Total: 0.2 mg/dL (ref 0.0–1.2)
CO2: 23 mmol/L (ref 20–29)
Calcium: 9.7 mg/dL (ref 8.6–10.2)
Chloride: 105 mmol/L (ref 96–106)
Creatinine, Ser: 1.18 mg/dL (ref 0.76–1.27)
Globulin, Total: 3.5 g/dL (ref 1.5–4.5)
Glucose: 60 mg/dL — ABNORMAL LOW (ref 70–99)
Potassium: 5.1 mmol/L (ref 3.5–5.2)
Sodium: 144 mmol/L (ref 134–144)
Total Protein: 7.3 g/dL (ref 6.0–8.5)
eGFR: 64 mL/min/{1.73_m2} (ref >59)

## 2023-01-22 LAB — URINE CULTURE: Organism ID, Bacteria: NO GROWTH

## 2023-01-24 ENCOUNTER — Ambulatory Visit (INDEPENDENT_AMBULATORY_CARE_PROVIDER_SITE_OTHER): Payer: PPO | Admitting: Family Medicine

## 2023-01-24 VITALS — BP 88/56 | HR 89 | Temp 98.2°F | Ht 70.0 in | Wt 129.0 lb

## 2023-01-24 DIAGNOSIS — R634 Abnormal weight loss: Secondary | ICD-10-CM

## 2023-01-24 DIAGNOSIS — D72829 Elevated white blood cell count, unspecified: Secondary | ICD-10-CM | POA: Diagnosis not present

## 2023-01-24 DIAGNOSIS — Z125 Encounter for screening for malignant neoplasm of prostate: Secondary | ICD-10-CM | POA: Diagnosis not present

## 2023-01-24 DIAGNOSIS — D72819 Decreased white blood cell count, unspecified: Secondary | ICD-10-CM

## 2023-01-24 DIAGNOSIS — H353231 Exudative age-related macular degeneration, bilateral, with active choroidal neovascularization: Secondary | ICD-10-CM | POA: Diagnosis not present

## 2023-01-24 NOTE — Patient Instructions (Signed)
 [name]  Please review the attached list of medications and notify my office if there are any errors.   . Please bring all of your medications to every appointment so we can make sure that our medication list is the same as yours.

## 2023-01-24 NOTE — Progress Notes (Signed)
 Established patient visit   Patient: [name]   DOB: 01-25-1945   78 y.o. Male  MRN: [id number] Visit Date: 01/24/2023  Today's healthcare provider: Jeralene Mom, MD   Chief Complaint  Patient presents with   Elevated White Count    Patient presents for persistent elevated white count.  Last labs  Lab Results      Component                Value               Date                      WBC                      19.0 (H)            01/19/2023                HGB                      12.0 (L)            01/19/2023                HCT                      35.9 (L)            01/19/2023                MCV                      102 (H)             01/19/2023                PLT                      341                 01/19/2023           Subjective    Discussed the use of AI scribe software for clinical note transcription with the patient, who gave verbal consent to proceed.  History of Present Illness   The patient presents for follow up of elevated WBC and unexplained weight loss. He report a decrease in weight from 140 pounds a year ago to 129 pounds currently. Despite this, he describes his appetite as "pretty good." He patient also reports persistent leg soreness, which has been ongoing since a hospital admission for pneumonia. The leg discomfort is significant enough to affect his mobility, with the patient stating he "couldn't walk or get up." He also describes a constant feeling of tiredness in his legs. He was noted to have WBC around 17 at his appt with Dr. Bradford Cadet 1 week ago, which increased to 19 when seen here by Janna on 1/10.  He also had negative chest xray and urine culture done at that visit.  The patient denies any changes in bowel habits, stomach pain, or the presence of rashes. He also denies any respiratory symptoms, such as chest congestion or cough.     Last CBC Lab Results  Component Value Date   WBC 19.0 (H) 01/19/2023   HGB 12.0 (L) 01/19/2023   HCT 35.9  (L) 01/19/2023   MCV 102 (H)  01/19/2023   MCH 34.2 (H) 01/19/2023   RDW 13.7 01/19/2023   PLT 341 01/19/2023   Last metabolic panel Lab Results  Component Value Date   GLUCOSE 60 (L) 01/19/2023   NA 144 01/19/2023   K 5.1 01/19/2023   CL 105 01/19/2023   CO2 23 01/19/2023   BUN 23 01/19/2023   CREATININE 1.18 01/19/2023   EGFR 64 01/19/2023   CALCIUM  9.7 01/19/2023   PHOS 3.5 05/10/2021   PROT 7.3 01/19/2023   ALBUMIN 3.8 01/19/2023   LABGLOB 3.5 01/19/2023   AGRATIO 1.6 05/10/2021   BILITOT 0.2 01/19/2023   ALKPHOS 94 01/19/2023   AST 19 01/19/2023   ALT 11 01/19/2023   ANIONGAP 4 (L) 11/28/2022     Medications: Outpatient Medications Prior to Visit  Medication Sig   aspirin  81 MG tablet Take 1 tablet (81 mg total) by mouth daily.   cholecalciferol (VITAMIN D3) 25 MCG (1000 UT) tablet Take 1,000 Units by mouth daily.   Coenzyme Q10 (COQ10) 100 MG CAPS Take 100 mg by mouth daily.    divalproex  (DEPAKOTE  ER) 500 MG 24 hr tablet Take 1,000 mg by mouth at bedtime.    feeding supplement (ENSURE ENLIVE / ENSURE PLUS) LIQD Take 237 mLs by mouth 2 (two) times daily between meals.   omeprazole  (PRILOSEC) 40 MG capsule TAKE 1 CAPSULE BY MOUTH EVERY DAY   simvastatin  (ZOCOR ) 40 MG tablet TAKE 1 TABLET BY MOUTH EVERY DAY AT 6PM   umeclidinium bromide  (INCRUSE ELLIPTA ) 62.5 MCG/ACT AEPB Inhale 1 puff into the lungs daily.   vitamin B-12 (CYANOCOBALAMIN ) 1000 MCG tablet Take 1,000 mcg by mouth daily.   ziprasidone  (GEODON ) 40 MG capsule Take 40 mg by mouth daily with supper.    No facility-administered medications prior to visit.   Review of Systems  Constitutional:  Positive for unexpected weight change. Negative for appetite change, chills, diaphoresis, fatigue and fever.  HENT:  Negative for congestion, postnasal drip, sinus pain and sore throat.   Respiratory:  Negative for cough and shortness of breath.   Cardiovascular:  Negative for chest pain and leg swelling.   Genitourinary:  Negative for flank pain, frequency and urgency.  Musculoskeletal:  Negative for joint swelling and myalgias.  Skin:  Negative for rash.  Neurological:  Negative for weakness and light-headedness.       Objective    BP (!) 88/56 (BP Location: Left Arm, Patient Position: Sitting, Cuff Size: Normal)   Pulse 89   Temp 98.2 F (36.8 C) (Oral)   Ht 5\' 10"  (1.778 m)   Wt 129 lb (58.5 kg)   SpO2 93%   BMI 18.51 kg/m   Physical Exam   General: Appearance:    Thin male in no acute distress  Eyes:    PERRL, conjunctiva/corneas clear, EOM's intact       Lungs:     Clear to auscultation bilaterally, respirations unlabored  Heart:    Normal heart rate. Normal rhythm. No murmurs, rubs, or gallops.    MS:   All extremities are intact.    Neurologic:   Awake, alert, oriented x 3. No apparent focal neurological defect.        Assessment & Plan        Unexplained Weight Loss Significant weight loss over the past year from 140 lbs to 129 lbs. No clear cause identified yet. No changes in bowel habits or abdominal pain reported. -Order comprehensive metabolic panel, thyroid  function tests, and pancreatic enzymes to rule  out metabolic or endocrine causes. -Consider referral to a hematologist if blood cell disorders are suspected based on lab results.  Elevated White Blood Cell Count Persistent elevation in white blood cell count. No signs of infection on chest x-ray or urine culture. -Continue monitoring white blood cell count. -Consider hematological evaluation if elevation persists without clear cause.        Jeralene Mom, MD  [facility] [phone] (phone) [phone] (fax)  [facility]

## 2023-01-25 LAB — AMYLASE: Amylase: 54 U/L (ref 31–110)

## 2023-01-25 LAB — LIPASE: Lipase: 20 U/L (ref 13–78)

## 2023-01-25 LAB — CBC WITH DIFFERENTIAL/PLATELET
Basophils Absolute: 0.1 10*3/uL (ref 0.0–0.2)
Basos: 0 %
EOS (ABSOLUTE): 0.1 10*3/uL (ref 0.0–0.4)
Eos: 1 %
Hematocrit: 32.9 % — ABNORMAL LOW (ref 37.5–51.0)
Hemoglobin: 11 g/dL — ABNORMAL LOW (ref 13.0–17.7)
Immature Grans (Abs): 0.1 10*3/uL (ref 0.0–0.1)
Immature Granulocytes: 1 %
Lymphocytes Absolute: 1.6 10*3/uL (ref 0.7–3.1)
Lymphs: 9 %
MCH: 34.1 pg — ABNORMAL HIGH (ref 26.6–33.0)
MCHC: 33.4 g/dL (ref 31.5–35.7)
MCV: 102 fL — ABNORMAL HIGH (ref 79–97)
Monocytes Absolute: 1.3 10*3/uL — ABNORMAL HIGH (ref 0.1–0.9)
Monocytes: 7 %
Neutrophils Absolute: 15.1 10*3/uL — ABNORMAL HIGH (ref 1.4–7.0)
Neutrophils: 82 %
Platelets: 288 10*3/uL (ref 150–450)
RBC: 3.23 x10E6/uL — ABNORMAL LOW (ref 4.14–5.80)
RDW: 13.6 % (ref 11.6–15.4)
WBC: 18.2 10*3/uL — ABNORMAL HIGH (ref 3.4–10.8)

## 2023-01-25 LAB — TSH: TSH: 2.7 u[IU]/mL (ref 0.450–4.500)

## 2023-01-25 LAB — CK: Total CK: 38 U/L — ABNORMAL LOW (ref 41–331)

## 2023-01-25 LAB — C-REACTIVE PROTEIN: CRP: 48 mg/L — ABNORMAL HIGH (ref 0–10)

## 2023-01-25 LAB — ANA W/REFLEX IF POSITIVE: Anti Nuclear Antibody (ANA): NEGATIVE

## 2023-01-25 LAB — T4, FREE: Free T4: 0.94 ng/dL (ref 0.82–1.77)

## 2023-01-25 LAB — SEDIMENTATION RATE: Sed Rate: 11 mm/h (ref 0–30)

## 2023-01-31 LAB — PSA: Prostate Specific Ag, Serum: 1.4 ng/mL (ref 0.0–4.0)

## 2023-01-31 LAB — SPECIMEN STATUS REPORT

## 2023-02-02 ENCOUNTER — Other Ambulatory Visit: Payer: Self-pay | Admitting: Family Medicine

## 2023-02-02 DIAGNOSIS — D72829 Elevated white blood cell count, unspecified: Secondary | ICD-10-CM

## 2023-02-02 DIAGNOSIS — R634 Abnormal weight loss: Secondary | ICD-10-CM

## 2023-02-02 DIAGNOSIS — F319 Bipolar disorder, unspecified: Secondary | ICD-10-CM

## 2023-02-02 DIAGNOSIS — J439 Emphysema, unspecified: Secondary | ICD-10-CM

## 2023-02-02 DIAGNOSIS — R4189 Other symptoms and signs involving cognitive functions and awareness: Secondary | ICD-10-CM

## 2023-02-02 DIAGNOSIS — G629 Polyneuropathy, unspecified: Secondary | ICD-10-CM

## 2023-02-02 DIAGNOSIS — M8580 Other specified disorders of bone density and structure, unspecified site: Secondary | ICD-10-CM

## 2023-02-02 DIAGNOSIS — F429 Obsessive-compulsive disorder, unspecified: Secondary | ICD-10-CM

## 2023-02-02 DIAGNOSIS — G2581 Restless legs syndrome: Secondary | ICD-10-CM

## 2023-02-02 DIAGNOSIS — E43 Unspecified severe protein-calorie malnutrition: Secondary | ICD-10-CM

## 2023-02-02 DIAGNOSIS — I700 Atherosclerosis of aorta: Secondary | ICD-10-CM

## 2023-02-02 DIAGNOSIS — M5126 Other intervertebral disc displacement, lumbar region: Secondary | ICD-10-CM

## 2023-02-02 DIAGNOSIS — M1990 Unspecified osteoarthritis, unspecified site: Secondary | ICD-10-CM

## 2023-02-02 DIAGNOSIS — D649 Anemia, unspecified: Secondary | ICD-10-CM

## 2023-02-05 ENCOUNTER — Emergency Department (HOSPITAL_COMMUNITY): Payer: PPO

## 2023-02-05 ENCOUNTER — Other Ambulatory Visit: Payer: Self-pay

## 2023-02-05 ENCOUNTER — Inpatient Hospital Stay (HOSPITAL_COMMUNITY)
Admission: EM | Admit: 2023-02-05 | Discharge: 2023-02-12 | DRG: 871 | Disposition: A | Discharge status: Skilled Nursing Facility | Payer: PPO | Attending: Internal Medicine | Admitting: Internal Medicine

## 2023-02-05 ENCOUNTER — Encounter (HOSPITAL_COMMUNITY): Payer: Self-pay

## 2023-02-05 DIAGNOSIS — J439 Emphysema, unspecified: Secondary | ICD-10-CM | POA: Diagnosis present

## 2023-02-05 DIAGNOSIS — R7989 Other specified abnormal findings of blood chemistry: Secondary | ICD-10-CM

## 2023-02-05 DIAGNOSIS — I480 Paroxysmal atrial fibrillation: Secondary | ICD-10-CM | POA: Diagnosis not present

## 2023-02-05 DIAGNOSIS — I771 Stricture of artery: Secondary | ICD-10-CM | POA: Diagnosis not present

## 2023-02-05 DIAGNOSIS — I4891 Unspecified atrial fibrillation: Secondary | ICD-10-CM | POA: Diagnosis present

## 2023-02-05 DIAGNOSIS — F319 Bipolar disorder, unspecified: Secondary | ICD-10-CM | POA: Diagnosis present

## 2023-02-05 DIAGNOSIS — E872 Acidosis, unspecified: Secondary | ICD-10-CM | POA: Insufficient documentation

## 2023-02-05 DIAGNOSIS — N179 Acute kidney failure, unspecified: Secondary | ICD-10-CM | POA: Diagnosis not present

## 2023-02-05 DIAGNOSIS — G2581 Restless legs syndrome: Secondary | ICD-10-CM | POA: Diagnosis present

## 2023-02-05 DIAGNOSIS — Z743 Need for continuous supervision: Secondary | ICD-10-CM | POA: Diagnosis not present

## 2023-02-05 DIAGNOSIS — Z1152 Encounter for screening for COVID-19: Secondary | ICD-10-CM

## 2023-02-05 DIAGNOSIS — J189 Pneumonia, unspecified organism: Secondary | ICD-10-CM | POA: Insufficient documentation

## 2023-02-05 DIAGNOSIS — Z981 Arthrodesis status: Secondary | ICD-10-CM

## 2023-02-05 DIAGNOSIS — I700 Atherosclerosis of aorta: Secondary | ICD-10-CM | POA: Diagnosis not present

## 2023-02-05 DIAGNOSIS — Z66 Do not resuscitate: Secondary | ICD-10-CM | POA: Diagnosis not present

## 2023-02-05 DIAGNOSIS — F419 Anxiety disorder, unspecified: Secondary | ICD-10-CM | POA: Diagnosis not present

## 2023-02-05 DIAGNOSIS — E559 Vitamin D deficiency, unspecified: Secondary | ICD-10-CM | POA: Diagnosis not present

## 2023-02-05 DIAGNOSIS — N400 Enlarged prostate without lower urinary tract symptoms: Secondary | ICD-10-CM | POA: Diagnosis present

## 2023-02-05 DIAGNOSIS — Z87891 Personal history of nicotine dependence: Secondary | ICD-10-CM

## 2023-02-05 DIAGNOSIS — R4182 Altered mental status, unspecified: Secondary | ICD-10-CM | POA: Diagnosis not present

## 2023-02-05 DIAGNOSIS — W19XXXA Unspecified fall, initial encounter: Secondary | ICD-10-CM | POA: Diagnosis not present

## 2023-02-05 DIAGNOSIS — M85842 Other specified disorders of bone density and structure, left hand: Secondary | ICD-10-CM | POA: Diagnosis not present

## 2023-02-05 DIAGNOSIS — I21A1 Myocardial infarction type 2: Secondary | ICD-10-CM | POA: Diagnosis not present

## 2023-02-05 DIAGNOSIS — Z7901 Long term (current) use of anticoagulants: Secondary | ICD-10-CM | POA: Diagnosis not present

## 2023-02-05 DIAGNOSIS — Z8673 Personal history of transient ischemic attack (TIA), and cerebral infarction without residual deficits: Secondary | ICD-10-CM

## 2023-02-05 DIAGNOSIS — R0602 Shortness of breath: Secondary | ICD-10-CM | POA: Diagnosis not present

## 2023-02-05 DIAGNOSIS — M799 Soft tissue disorder, unspecified: Secondary | ICD-10-CM | POA: Diagnosis not present

## 2023-02-05 DIAGNOSIS — I213 ST elevation (STEMI) myocardial infarction of unspecified site: Secondary | ICD-10-CM | POA: Diagnosis not present

## 2023-02-05 DIAGNOSIS — R262 Difficulty in walking, not elsewhere classified: Secondary | ICD-10-CM | POA: Diagnosis present

## 2023-02-05 DIAGNOSIS — M7989 Other specified soft tissue disorders: Secondary | ICD-10-CM | POA: Diagnosis not present

## 2023-02-05 DIAGNOSIS — J440 Chronic obstructive pulmonary disease with acute lower respiratory infection: Secondary | ICD-10-CM | POA: Diagnosis not present

## 2023-02-05 DIAGNOSIS — I4892 Unspecified atrial flutter: Secondary | ICD-10-CM | POA: Diagnosis not present

## 2023-02-05 DIAGNOSIS — L89626 Pressure-induced deep tissue damage of left heel: Secondary | ICD-10-CM | POA: Diagnosis present

## 2023-02-05 DIAGNOSIS — Y92009 Unspecified place in unspecified non-institutional (private) residence as the place of occurrence of the external cause: Secondary | ICD-10-CM

## 2023-02-05 DIAGNOSIS — R230 Cyanosis: Secondary | ICD-10-CM | POA: Diagnosis not present

## 2023-02-05 DIAGNOSIS — I739 Peripheral vascular disease, unspecified: Secondary | ICD-10-CM | POA: Diagnosis not present

## 2023-02-05 DIAGNOSIS — I214 Non-ST elevation (NSTEMI) myocardial infarction: Principal | ICD-10-CM | POA: Diagnosis present

## 2023-02-05 DIAGNOSIS — A4189 Other specified sepsis: Principal | ICD-10-CM | POA: Diagnosis present

## 2023-02-05 DIAGNOSIS — F429 Obsessive-compulsive disorder, unspecified: Secondary | ICD-10-CM | POA: Diagnosis not present

## 2023-02-05 DIAGNOSIS — M79642 Pain in left hand: Secondary | ICD-10-CM | POA: Diagnosis not present

## 2023-02-05 DIAGNOSIS — M85862 Other specified disorders of bone density and structure, left lower leg: Secondary | ICD-10-CM | POA: Diagnosis not present

## 2023-02-05 DIAGNOSIS — Z888 Allergy status to other drugs, medicaments and biological substances status: Secondary | ICD-10-CM

## 2023-02-05 DIAGNOSIS — S199XXA Unspecified injury of neck, initial encounter: Secondary | ICD-10-CM | POA: Diagnosis not present

## 2023-02-05 DIAGNOSIS — R413 Other amnesia: Secondary | ICD-10-CM | POA: Diagnosis present

## 2023-02-05 DIAGNOSIS — Z833 Family history of diabetes mellitus: Secondary | ICD-10-CM

## 2023-02-05 DIAGNOSIS — S60512A Abrasion of left hand, initial encounter: Secondary | ICD-10-CM | POA: Diagnosis present

## 2023-02-05 DIAGNOSIS — F03A4 Unspecified dementia, mild, with anxiety: Secondary | ICD-10-CM | POA: Diagnosis not present

## 2023-02-05 DIAGNOSIS — W1830XA Fall on same level, unspecified, initial encounter: Secondary | ICD-10-CM | POA: Diagnosis present

## 2023-02-05 DIAGNOSIS — R3915 Urgency of urination: Secondary | ICD-10-CM | POA: Diagnosis not present

## 2023-02-05 DIAGNOSIS — R6520 Severe sepsis without septic shock: Secondary | ICD-10-CM | POA: Diagnosis present

## 2023-02-05 DIAGNOSIS — F0393 Unspecified dementia, unspecified severity, with mood disturbance: Secondary | ICD-10-CM | POA: Diagnosis present

## 2023-02-05 DIAGNOSIS — Z85118 Personal history of other malignant neoplasm of bronchus and lung: Secondary | ICD-10-CM

## 2023-02-05 DIAGNOSIS — H9193 Unspecified hearing loss, bilateral: Secondary | ICD-10-CM | POA: Diagnosis present

## 2023-02-05 DIAGNOSIS — Z885 Allergy status to narcotic agent status: Secondary | ICD-10-CM

## 2023-02-05 DIAGNOSIS — Z8249 Family history of ischemic heart disease and other diseases of the circulatory system: Secondary | ICD-10-CM

## 2023-02-05 DIAGNOSIS — B338 Other specified viral diseases: Secondary | ICD-10-CM

## 2023-02-05 DIAGNOSIS — S80212A Abrasion, left knee, initial encounter: Secondary | ICD-10-CM | POA: Diagnosis present

## 2023-02-05 DIAGNOSIS — I672 Cerebral atherosclerosis: Secondary | ICD-10-CM | POA: Diagnosis not present

## 2023-02-05 DIAGNOSIS — M170 Bilateral primary osteoarthritis of knee: Secondary | ICD-10-CM | POA: Diagnosis not present

## 2023-02-05 DIAGNOSIS — Z79899 Other long term (current) drug therapy: Secondary | ICD-10-CM

## 2023-02-05 DIAGNOSIS — I6782 Cerebral ischemia: Secondary | ICD-10-CM | POA: Diagnosis not present

## 2023-02-05 DIAGNOSIS — Z974 Presence of external hearing-aid: Secondary | ICD-10-CM

## 2023-02-05 DIAGNOSIS — K219 Gastro-esophageal reflux disease without esophagitis: Secondary | ICD-10-CM | POA: Diagnosis present

## 2023-02-05 DIAGNOSIS — Z23 Encounter for immunization: Secondary | ICD-10-CM | POA: Diagnosis not present

## 2023-02-05 DIAGNOSIS — M6282 Rhabdomyolysis: Secondary | ICD-10-CM | POA: Insufficient documentation

## 2023-02-05 DIAGNOSIS — J121 Respiratory syncytial virus pneumonia: Secondary | ICD-10-CM | POA: Diagnosis not present

## 2023-02-05 DIAGNOSIS — I70212 Atherosclerosis of native arteries of extremities with intermittent claudication, left leg: Secondary | ICD-10-CM | POA: Diagnosis not present

## 2023-02-05 DIAGNOSIS — E538 Deficiency of other specified B group vitamins: Secondary | ICD-10-CM | POA: Diagnosis not present

## 2023-02-05 DIAGNOSIS — M19042 Primary osteoarthritis, left hand: Secondary | ICD-10-CM | POA: Diagnosis not present

## 2023-02-05 DIAGNOSIS — R0902 Hypoxemia: Secondary | ICD-10-CM | POA: Diagnosis not present

## 2023-02-05 DIAGNOSIS — S90822A Blister (nonthermal), left foot, initial encounter: Secondary | ICD-10-CM | POA: Diagnosis present

## 2023-02-05 DIAGNOSIS — E785 Hyperlipidemia, unspecified: Secondary | ICD-10-CM | POA: Diagnosis not present

## 2023-02-05 DIAGNOSIS — R531 Weakness: Secondary | ICD-10-CM | POA: Diagnosis not present

## 2023-02-05 DIAGNOSIS — S8992XA Unspecified injury of left lower leg, initial encounter: Secondary | ICD-10-CM | POA: Diagnosis not present

## 2023-02-05 DIAGNOSIS — M4802 Spinal stenosis, cervical region: Secondary | ICD-10-CM | POA: Diagnosis not present

## 2023-02-05 DIAGNOSIS — E7800 Pure hypercholesterolemia, unspecified: Secondary | ICD-10-CM | POA: Diagnosis not present

## 2023-02-05 DIAGNOSIS — M1712 Unilateral primary osteoarthritis, left knee: Secondary | ICD-10-CM | POA: Diagnosis not present

## 2023-02-05 DIAGNOSIS — Z602 Problems related to living alone: Secondary | ICD-10-CM | POA: Diagnosis present

## 2023-02-05 DIAGNOSIS — J449 Chronic obstructive pulmonary disease, unspecified: Secondary | ICD-10-CM | POA: Diagnosis not present

## 2023-02-05 DIAGNOSIS — E43 Unspecified severe protein-calorie malnutrition: Secondary | ICD-10-CM | POA: Diagnosis not present

## 2023-02-05 DIAGNOSIS — M25572 Pain in left ankle and joints of left foot: Secondary | ICD-10-CM | POA: Diagnosis not present

## 2023-02-05 DIAGNOSIS — Z7982 Long term (current) use of aspirin: Secondary | ICD-10-CM

## 2023-02-05 LAB — CBC WITH DIFFERENTIAL/PLATELET
Abs Immature Granulocytes: 0 10*3/uL (ref 0.00–0.07)
Basophils Absolute: 0.3 10*3/uL — ABNORMAL HIGH (ref 0.0–0.1)
Basophils Relative: 1 %
Eosinophils Absolute: 0 10*3/uL (ref 0.0–0.5)
Eosinophils Relative: 0 %
HCT: 36.5 % — ABNORMAL LOW (ref 39.0–52.0)
Hemoglobin: 12 g/dL — ABNORMAL LOW (ref 13.0–17.0)
Lymphocytes Relative: 2 %
Lymphs Abs: 0.7 10*3/uL (ref 0.7–4.0)
MCH: 34.2 pg — ABNORMAL HIGH (ref 26.0–34.0)
MCHC: 32.9 g/dL (ref 30.0–36.0)
MCV: 104 fL — ABNORMAL HIGH (ref 80.0–100.0)
Monocytes Absolute: 2.8 10*3/uL — ABNORMAL HIGH (ref 0.1–1.0)
Monocytes Relative: 8 %
Neutro Abs: 30.7 10*3/uL — ABNORMAL HIGH (ref 1.7–7.7)
Neutrophils Relative %: 89 %
Platelets: 352 10*3/uL (ref 150–400)
RBC: 3.51 MIL/uL — ABNORMAL LOW (ref 4.22–5.81)
RDW: 14.7 % (ref 11.5–15.5)
WBC: 34.5 10*3/uL — ABNORMAL HIGH (ref 4.0–10.5)
nRBC: 0 % (ref 0.0–0.2)
nRBC: 0 /100{WBCs}

## 2023-02-05 LAB — COMPREHENSIVE METABOLIC PANEL
ALT: 37 U/L (ref 0–44)
AST: 198 U/L — ABNORMAL HIGH (ref 15–41)
Albumin: 2.8 g/dL — ABNORMAL LOW (ref 3.5–5.0)
Alkaline Phosphatase: 80 U/L (ref 38–126)
Anion gap: 21 — ABNORMAL HIGH (ref 5–15)
BUN: 26 mg/dL — ABNORMAL HIGH (ref 8–23)
CO2: 16 mmol/L — ABNORMAL LOW (ref 22–32)
Calcium: 9.9 mg/dL (ref 8.9–10.3)
Chloride: 108 mmol/L (ref 98–111)
Creatinine, Ser: 3.18 mg/dL — ABNORMAL HIGH (ref 0.61–1.24)
GFR, Estimated: 19 mL/min — ABNORMAL LOW (ref >60)
Glucose, Bld: 106 mg/dL — ABNORMAL HIGH (ref 70–99)
Potassium: 4.1 mmol/L (ref 3.5–5.1)
Sodium: 145 mmol/L (ref 135–145)
Total Bilirubin: 0.8 mg/dL (ref 0.0–1.2)
Total Protein: 7.3 g/dL (ref 6.5–8.1)

## 2023-02-05 LAB — TROPONIN I (HIGH SENSITIVITY)
Troponin I (High Sensitivity): 1867 ng/L (ref <18)
Troponin I (High Sensitivity): 2607 ng/L (ref <18)
Troponin I (High Sensitivity): 2514 ng/L (ref <18)
Troponin I (High Sensitivity): 3931 ng/L (ref <18)

## 2023-02-05 LAB — PROCALCITONIN: Procalcitonin: 6.26 ng/mL

## 2023-02-05 LAB — RESP PANEL BY RT-PCR (RSV, FLU A&B, COVID)  RVPGX2
Influenza A by PCR: NEGATIVE
Influenza B by PCR: NEGATIVE
Resp Syncytial Virus by PCR: POSITIVE — AB
SARS Coronavirus 2 by RT PCR: NEGATIVE

## 2023-02-05 LAB — CK: Total CK: 7533 U/L — ABNORMAL HIGH (ref 49–397)

## 2023-02-05 LAB — MAGNESIUM: Magnesium: 2.6 mg/dL — ABNORMAL HIGH (ref 1.7–2.4)

## 2023-02-05 LAB — D-DIMER, QUANTITATIVE: D-Dimer, Quant: 6.07 ug{FEU}/mL — ABNORMAL HIGH (ref 0.00–0.50)

## 2023-02-05 LAB — LACTIC ACID, PLASMA
Lactic Acid, Venous: 3 mmol/L (ref 0.5–1.9)
Lactic Acid, Venous: 3.4 mmol/L (ref 0.5–1.9)

## 2023-02-05 MED ORDER — POLYETHYLENE GLYCOL 3350 17 G PO PACK: 17.0000 g | PACK | Freq: Every day | ORAL | Status: DC | PRN

## 2023-02-05 MED ORDER — DIVALPROEX SODIUM ER 500 MG PO TB24
1000.0000 mg | ORAL_TABLET | Freq: Every day | ORAL | Status: DC
  Administered 2023-02-05 – 2023-02-11 (×7): 1000 mg via ORAL
  Filled 2023-02-05 (×8): qty 2

## 2023-02-05 MED ORDER — HEPARIN BOLUS VIA INFUSION
3500.0000 [IU] | Freq: Once | INTRAVENOUS | Status: AC
  Administered 2023-02-05: 3500 [IU] via INTRAVENOUS
  Filled 2023-02-05: qty 3500

## 2023-02-05 MED ORDER — SODIUM CHLORIDE 0.9 % IV SOLN: 250.0000 mL | INTRAVENOUS | Status: AC | PRN

## 2023-02-05 MED ORDER — ACETAMINOPHEN 325 MG PO TABS
650.0000 mg | ORAL_TABLET | Freq: Four times a day (QID) | ORAL | Status: DC | PRN
  Administered 2023-02-09 – 2023-02-11 (×2): 650 mg via ORAL
  Filled 2023-02-05 (×2): qty 2

## 2023-02-05 MED ORDER — SODIUM CHLORIDE 0.9% FLUSH: 3.0000 mL | INTRAVENOUS | Status: DC | PRN

## 2023-02-05 MED ORDER — SODIUM CHLORIDE 0.45 % IV SOLN
INTRAVENOUS | Status: AC
  Filled 2023-02-05 (×2): qty 75

## 2023-02-05 MED ORDER — SODIUM CHLORIDE 0.9 % IV SOLN
100.0000 mg | Freq: Two times a day (BID) | INTRAVENOUS | Status: AC
  Administered 2023-02-05 – 2023-02-10 (×10): 100 mg via INTRAVENOUS
  Filled 2023-02-05 (×10): qty 100

## 2023-02-05 MED ORDER — ZIPRASIDONE HCL 40 MG PO CAPS
40.0000 mg | ORAL_CAPSULE | Freq: Every day | ORAL | Status: DC
  Administered 2023-02-05 – 2023-02-11 (×7): 40 mg via ORAL
  Filled 2023-02-05 (×8): qty 1

## 2023-02-05 MED ORDER — ACETAMINOPHEN 650 MG RE SUPP
650.0000 mg | Freq: Four times a day (QID) | RECTAL | Status: DC | PRN
Start: 2023-02-05 — End: 2023-02-12

## 2023-02-05 MED ORDER — SIMVASTATIN 20 MG PO TABS: 40.0000 mg | ORAL_TABLET | Freq: Every day | ORAL | Status: DC

## 2023-02-05 MED ORDER — PANTOPRAZOLE SODIUM 40 MG PO TBEC
40.0000 mg | DELAYED_RELEASE_TABLET | Freq: Every day | ORAL | Status: DC
  Administered 2023-02-06 – 2023-02-12 (×7): 40 mg via ORAL
  Filled 2023-02-05 (×7): qty 1

## 2023-02-05 MED ORDER — ASPIRIN 81 MG PO TBEC
81.0000 mg | DELAYED_RELEASE_TABLET | Freq: Every day | ORAL | Status: DC
  Administered 2023-02-06: 81 mg via ORAL
  Filled 2023-02-05: qty 1

## 2023-02-05 MED ORDER — IPRATROPIUM-ALBUTEROL 0.5-2.5 (3) MG/3ML IN SOLN: 3.0000 mL | Freq: Four times a day (QID) | RESPIRATORY_TRACT | Status: DC | PRN

## 2023-02-05 MED ORDER — SODIUM CHLORIDE 0.9 % IV SOLN
INTRAVENOUS | Status: DC
Start: 2023-02-05 — End: 2023-02-05

## 2023-02-05 MED ORDER — ASPIRIN 325 MG PO TABS
325.0000 mg | ORAL_TABLET | Freq: Every day | ORAL | Status: DC
  Administered 2023-02-05: 325 mg via ORAL
  Filled 2023-02-05: qty 1

## 2023-02-05 MED ORDER — BISACODYL 5 MG PO TBEC: 5.0000 mg | DELAYED_RELEASE_TABLET | Freq: Every day | ORAL | Status: DC | PRN

## 2023-02-05 MED ORDER — SODIUM CHLORIDE 0.9 % IV SOLN
1.0000 g | INTRAVENOUS | Status: AC
  Administered 2023-02-05 – 2023-02-09 (×5): 1 g via INTRAVENOUS
  Filled 2023-02-05 (×5): qty 10

## 2023-02-05 MED ORDER — HYDROCOD POLI-CHLORPHE POLI ER 10-8 MG/5ML PO SUER: 5.0000 mL | Freq: Two times a day (BID) | ORAL | Status: DC | PRN

## 2023-02-05 MED ORDER — HEPARIN (PORCINE) 25000 UT/250ML-% IV SOLN
1050.0000 [IU]/h | INTRAVENOUS | Status: DC
  Administered 2023-02-05: 700 [IU]/h via INTRAVENOUS
  Filled 2023-02-05: qty 250

## 2023-02-05 MED ORDER — GUAIFENESIN ER 600 MG PO TB12
600.0000 mg | ORAL_TABLET | Freq: Two times a day (BID) | ORAL | Status: DC
Start: 2023-02-05 — End: 2023-02-10
  Administered 2023-02-05 – 2023-02-10 (×10): 600 mg via ORAL
  Filled 2023-02-05 (×10): qty 1

## 2023-02-05 MED ORDER — SODIUM CHLORIDE 0.9% FLUSH
3.0000 mL | Freq: Two times a day (BID) | INTRAVENOUS | Status: DC
  Administered 2023-02-06 – 2023-02-12 (×10): 3 mL via INTRAVENOUS

## 2023-02-05 MED ORDER — TETANUS-DIPHTH-ACELL PERTUSSIS 5-2.5-18.5 LF-MCG/0.5 IM SUSY
0.5000 mL | PREFILLED_SYRINGE | Freq: Once | INTRAMUSCULAR | Status: AC
  Administered 2023-02-05: 0.5 mL via INTRAMUSCULAR
  Filled 2023-02-05: qty 0.5

## 2023-02-05 MED ORDER — UMECLIDINIUM BROMIDE 62.5 MCG/ACT IN AEPB
1.0000 | INHALATION_SPRAY | Freq: Every day | RESPIRATORY_TRACT | Status: DC
  Administered 2023-02-08 – 2023-02-12 (×5): 1 via RESPIRATORY_TRACT
  Filled 2023-02-05 (×2): qty 7

## 2023-02-05 MED ORDER — IOHEXOL 350 MG/ML SOLN
100.0000 mL | Freq: Once | INTRAVENOUS | Status: AC | PRN
  Administered 2023-02-05: 100 mL via INTRAVENOUS

## 2023-02-05 MED ORDER — SODIUM CHLORIDE 0.9% FLUSH: 3.0000 mL | Freq: Two times a day (BID) | INTRAVENOUS | Status: DC

## 2023-02-05 MED ORDER — SODIUM CHLORIDE 0.9 % IV BOLUS
1000.0000 mL | Freq: Once | INTRAVENOUS | Status: AC
  Administered 2023-02-05: 1000 mL via INTRAVENOUS

## 2023-02-05 MED ORDER — SODIUM CHLORIDE 0.45 % IV SOLN
INTRAVENOUS | Status: DC
  Filled 2023-02-05 (×2): qty 75

## 2023-02-05 MED ORDER — SODIUM CHLORIDE 0.9 % IV SOLN
500.0000 mg | INTRAVENOUS | Status: DC
  Filled 2023-02-05: qty 5

## 2023-02-05 NOTE — ED Notes (Signed)
Patient transported to CT

## 2023-02-05 NOTE — ED Provider Triage Note (Signed)
Emergency Medicine Provider Triage Evaluation Note  [name] , a 78 y.o. male  was evaluated in triage.  Pt complains of here with EMS after being found down.  Patient initially reported to them a mechanical fall however patient is not making a lot of sense on exam.  He was noted to be short of breath when EMS arrived and did require a DuoNeb they also felt that he was tachycardic and may be in atrial flutter the patient denies any chest pain.  No note that patient has been pale and diaphoretic.  Unclear how long he was on the ground but he told somebody several hours..  Review of Systems  Positive: Short of breath, fall, possible syncope, confusion Negative: Chest pain, abdominal pain, headache  Physical Exam  BP 122/71 (BP Location: Right Arm)   Pulse (!) 52   Temp 98.7 F (37.1 C) (Oral)   Resp 18   SpO2 100%  Gen:   Ill-appearing and diaphoretic Resp:  Decreased breath sounds throughout MSK:   Moves extremities without difficulty  Other:  Patient is pale, attention seems appropriate but sometimes speech is incomprehensible but at others its clear and oriented  Medical Decision Making  Medically screening exam initiated at 12:24 PM.  Appropriate orders placed.  [name] was informed that the remainder of the evaluation will be completed by another provider, this initial triage assessment does not replace that evaluation, and the importance of remaining in the ED until their evaluation is complete.     Gwyneth Sprout, MD 02/05/23 1226

## 2023-02-05 NOTE — ED Notes (Addendum)
Oncall hospitalist paged - critical values of lactic and troponin relayed to Dr. Arlean Hopping

## 2023-02-05 NOTE — Progress Notes (Addendum)
TR[facility] night cross cover note:   I was notified by RN of elevated lactate of 3.0 in this patient who was admitted earlier today with type II NSTEMI, severe sepsis due to pneumonia, AKI rhabdomyolysis, and RSV.   In the setting of his existing diagnosis of severe sepsis, blood cultures x 2 were previously collected, and he is on Rocephin and azithromycin for community-acquired pneumonia.  Most recent notable for afebrile with heart rates in the 80s, blood pressure 119/68, respiratory rate 21 and oxygen saturation 92 to 96% on room air.  in the absence of lactic acid level that is greater than or equal to 4.0, and in the absence of any associated hypotension refractory to IVF's, there are no indications for administration of a 30 mL/kg IVF bolus at this time.  He has an existing order for repeat lactic acid level to be checked within 3 hours of initial.  I will also order ivf at this time with plan to monitor for ensuing evidence of development of fluid overload.  Also, troponin now slightly higher, at 3900 compared to most recent provide 2500.  Cardiology has already been consulted and are following, recommending continuation of heparin drip.  I have ordered a repeat troponin level to be checked in the morning.   Newton Pigg, DO Hospitalist

## 2023-02-05 NOTE — ED Notes (Signed)
CRITICAL VALUE STICKER  CRITICAL VALUE:Trop 1,[id number]  RECEIVER (on-site recipient of call):I. Adessa Primiano  DATE & TIME NOTIFIED: 02/04/22 1404  MESSENGER (representative from lab):Erika  MD NOTIFIED: Hart Rochester  TIME OF NOTIFICATION:1405  RESPONSE:

## 2023-02-05 NOTE — Consult Note (Signed)
Cardiology Consultation   Patient ID: DEVARIOUS PAVEK MRN: [id number]; DOB: 04/22/1945  Admit date: 02/05/2023 Date of Consult: 02/05/2023  PCP:  Malva Limes, MD   [facility] Providers Cardiologist:  Lorine Bears, MD        Patient Profile:   [name] is a 78 y.o. male with a hx of history of bipolar disorder, depression, GERD, history of lung cancer status post resection 2020,  who is being seen 02/05/2023 for the evaluation of elevated troponin, atrial flutter/fib at the request of Dr. Karene Fry.  History of Present Illness:   [name] presents after being found down for a few days.  His CK significantly elevated at 7533, troponin was 1867-> 2607.  His EKG is significant for rate control variable flutter/fib.  His lab tests are significant for a significant leukocytosis of 35K (has been chronically elevated, but not this high) , acute renal failure with creatinine of 3.18 from 1.1 in early January, and positive for RSV.  He had a CT head that was unremarkable.  He had a CT PE, no signs of pulmonary embolism or dissection.  He has a possible cancerous lesion in his left lower lobe. In November cardiology was consulted for elevated troponin.  Levels remained flat and were around 300.  He was having no cardiac symptoms at that time.  His echocardiogram was unremarkable.  There is no suspicion for acute ischemia.  Otherwise he has no history of cardiovascular disease.His family member reported SOB, but no CP.    Past Medical History:  Diagnosis Date   Arthritis    in his fingers   Bipolar disorder (HCC)    BPH (benign prostatic hyperplasia)    Cancer (HCC) 2020   LUNG CA   Depression    Emphysema    Episodic confusion    Possible bipolar disorder   GERD (gastroesophageal reflux disease)    History of chicken pox    History of hiatal hernia    HNP (herniated nucleus pulposus), lumbar    Hyperlipidemia    Lung abscess (HCC) 01/06/2019   Memory deficit  07/31/2012   OCD (obsessive compulsive disorder)    Peripheral neuropathy    Restless leg syndrome 01/10/2003   Unspecified psychosis 03/01/2012   Wears dentures    full upper and lower   Wears hearing aid in both ears     Past Surgical History:  Procedure Laterality Date   BACK SURGERY     COLONOSCOPY     ESOPHAGOGASTRODUODENOSCOPY     ESOPHAGOGASTRODUODENOSCOPY (EGD) WITH PROPOFOL [name] 02/28/2019   Procedure: ESOPHAGOGASTRODUODENOSCOPY (EGD) WITH DILATION;  Surgeon: Midge Minium, MD;  Location: [facility];  Service: Endoscopy;  Laterality: [name];  Priority 3   FLEXIBLE BRONCHOSCOPY [name] 01/06/2019   Procedure: FLEXIBLE BRONCHOSCOPY;  Surgeon: Hulda Marin, MD;  Location: [facility];  Service: Thoracic;  Laterality: [name];   liver excision     for non-cancerous mass   LUMBAR LAMINECTOMY/DECOMPRESSION MICRODISCECTOMY Left 06/22/2017   Procedure: Extraforaminal Microdiscectomy  - L4-L5 - left;  Surgeon: Tia Alert, MD;  Location: [facility] OR;  Service: Neurosurgery;  Laterality: Left;   LUNG SURGERY  2005   Lipoma Removal   MAXIMUM ACCESS (MAS) TRANSFORAMINAL LUMBAR INTERBODY FUSION (TLIF) 1 LEVEL [name] 02/18/2018   Procedure: Transforaminal Lumbar Interbody Fusion - Lumbar Four - Lumbar Five;  Surgeon: Tia Alert, MD;  Location: [facility];  Service: Neurosurgery;  Laterality: [name];  Transforaminal Lumbar Interbody Fusion - Lumbar Four -  Lumbar Five   THORACOTOMY Right 01/06/2019   Procedure: THORACOTOMY MAJOR;  Surgeon: Hulda Marin, MD;  Location: [facility];  Service: Thoracic;  Laterality: Right;   VIDEO ASSISTED THORACOSCOPY (VATS)/WEDGE RESECTION Right 01/06/2019   Procedure: VIDEO ASSISTED THORACOSCOPY (VATS)/WEDGE RESECTION-RUL and RLL-lung resection;  Surgeon: Hulda Marin, MD;  Location: [facility];  Service: Thoracic;  Laterality: Right;   XI ROBOTIC ASSISTED PARAESOPHAGEAL HERNIA REPAIR [name] 05/06/2019   Procedure: XI ROBOTIC ASSISTED PARAESOPHAGEAL HERNIA REPAIR;  Surgeon: Leafy Ro, MD;  Location: [facility];  Service: General;  Laterality: [name];     Home Medications:  Prior to Admission medications   Medication Sig Start Date End Date Taking? Authorizing Provider  aspirin 81 MG tablet Take 1 tablet (81 mg total) by mouth daily. 08/06/17   Malva Limes, MD  cholecalciferol (VITAMIN D3) 25 MCG (1000 UT) tablet Take 1,000 Units by mouth daily.    [provider]  Coenzyme Q10 (COQ10) 100 MG CAPS Take 100 mg by mouth daily.     [provider]  divalproex (DEPAKOTE ER) 500 MG 24 hr tablet Take 1,000 mg by mouth at bedtime.     [provider]  feeding supplement (ENSURE ENLIVE / ENSURE PLUS) LIQD Take 237 mLs by mouth 2 (two) times daily between meals. 11/30/22   Delfino Lovett, MD  omeprazole (PRILOSEC) 40 MG capsule TAKE 1 CAPSULE BY MOUTH EVERY DAY 04/27/22   Malva Limes, MD  simvastatin (ZOCOR) 40 MG tablet TAKE 1 TABLET BY MOUTH EVERY DAY AT 6PM Patient taking differently: Take 40 mg by mouth daily. 04/27/22   Malva Limes, MD  umeclidinium bromide (INCRUSE ELLIPTA) 62.5 MCG/ACT AEPB Inhale 1 puff into the lungs daily. 11/08/22   Malva Limes, MD  vitamin B-12 (CYANOCOBALAMIN) 1000 MCG tablet Take 1,000 mcg by mouth daily.    [provider]  ziprasidone (GEODON) 40 MG capsule Take 40 mg by mouth daily with supper.     [provider]    Inpatient Medications: Scheduled Meds:  aspirin  325 mg Oral Daily   Continuous Infusions:  heparin 900 Units/hr (02/05/23 1709)   PRN Meds:   Allergies:    Allergies  Allergen Reactions   Ropinirole Hcl Other (See Comments)    Confusion, agiitation, disorientation, Hallucinations   Tramadol     lethargy    Social History:   Social History   Socioeconomic History   Marital status: Divorced    Spouse name: Not on file   Number of children: 0   Years of education: 11   Highest education level: 11th grade  Occupational History   Occupation: Retired   Tobacco Use   Smoking status: Former    Current packs/day: 0.00    Average packs/day: 0.8 packs/day for 50.0 years (37.5 ttl pk-yrs)    Types: Cigarettes    Start date: 11/25/1968    Quit date: 11/26/2018    Years since quitting: 4.1   Smokeless tobacco: Never  Vaping Use   Vaping status: Never Used  Substance and Sexual Activity   Alcohol use: No   Drug use: No   Sexual activity: Not on file  Other Topics Concern   Not on file  Social History Narrative   Patient lives at home alone.   Patient is retired.    Patient has no children.    Patient has 11th grade education.    Social Drivers of Health   Financial Resource Strain: Low Risk  (01/21/2023)  Overall Financial Resource Strain (CARDIA)    Difficulty of Paying Living Expenses: Not hard at all  Food Insecurity: No Food Insecurity (01/21/2023)   Hunger Vital Sign    Worried About Running Out of Food in the Last Year: Never true    Ran Out of Food in the Last Year: Never true  Transportation Needs: No Transportation Needs (01/21/2023)   PRAPARE - Administrator, Civil Service (Medical): No    Lack of Transportation (Non-Medical): No  Physical Activity: Unknown (01/21/2023)   Exercise Vital Sign    Days of Exercise per Week: 0 days    Minutes of Exercise per Session: Not on file  Stress: No Stress Concern Present (01/21/2023)   [employer] of Occupational Health - Occupational Stress Questionnaire    Feeling of Stress : Not at all  Social Connections: Moderately Isolated (01/21/2023)   Social Connection and Isolation Panel [NHANES]    Frequency of Communication with Friends and Family: More than three times a week    Frequency of Social Gatherings with Friends and Family: More than three times a week    Attends Religious Services: More than 4 times per year    Active Member of [employer] or Organizations: No    Attends Engineer, structural: Not on file    Marital Status: Divorced  Intimate Partner  Violence: Not At Risk (12/15/2022)   Humiliation, Afraid, Rape, and Kick questionnaire    Fear of Current or Ex-Partner: No    Emotionally Abused: No    Physically Abused: No    Sexually Abused: No    Family History:    Family History  Problem Relation Age of Onset   Heart disease Mother    Cancer Father        lung   Heart disease Father    Diabetes Sister    Heart disease Sister    Seizures Paternal Grandfather      ROS:  Please see the history of present illness.   All other ROS reviewed and negative.     Physical Exam/Data:   Vitals:   02/05/23 1600 02/05/23 1628 02/05/23 1630 02/05/23 1715  BP: 119/68  138/71 119/78  Pulse: 60  85 89  Resp: 19  (!) 24 (!) 24  Temp:  98.9 F (37.2 C)    TempSrc:  Oral    SpO2: 95%  97% 96%   No intake or output data in the 24 hours ending 02/05/23 1720    01/24/2023    9:10 AM 01/19/2023   12:34 PM 12/04/2022   10:28 AM  Last 3 Weights  Weight (lbs) 129 lb 131 lb 9.6 oz 134 lb  Weight (kg) 58.514 kg 59.693 kg 60.782 kg     There is no height or weight on file to calculate BMI.  General:  Can't hear well, not communicating HEENT: normal Neck: no JVD Vascular: No carotid bruits; Distal pulses 2+ bilaterally Cardiac:  normal S1, S2; RRR; no murmur  Lungs:  nll wob, coarse BS BL Abd: soft, nontender, no hepatomegaly  Ext: no edema Musculoskeletal:  No deformities, BUE and BLE strength normal and equal Skin: warm and dry  Neuro:  CNs 2-12 intact, no focal abnormalities noted Psych:  Normal affect   EKG:  The EKG was personally reviewed and demonstrates:  ?AIVR. Atrial flutter with variable block/fib.  Telemetry:  Telemetry was personally reviewed and demonstrates:  ?AIVR  Relevant CV Studies: TTE 11/27/2022 1. Left ventricular ejection fraction, by  estimation, is 55 to 60%. The  left ventricle has normal function. The left ventricle has no regional  wall motion abnormalities. Left ventricular diastolic parameters are   consistent with Grade I diastolic  dysfunction (impaired relaxation).   2. Right ventricular systolic function is normal. The right ventricular  size is normal. Tricuspid regurgitation signal is inadequate for assessing  PA pressure.   3. The mitral valve is normal in structure. No evidence of mitral valve  regurgitation. No evidence of mitral stenosis.   4. The aortic valve is normal in structure. Aortic valve regurgitation is  not visualized. Aortic valve sclerosis/calcification is present, without  any evidence of aortic stenosis.    Laboratory Data:  High Sensitivity Troponin:   Recent Labs  Lab 02/05/23 1237 02/05/23 1419  TROPONINIHS 1,867* 2,607*     Chemistry Recent Labs  Lab 02/05/23 1237  NA 145  K 4.1  CL 108  CO2 16*  GLUCOSE 106*  BUN 26*  CREATININE 3.18*  CALCIUM 9.9  MG 2.6*  GFRNONAA 19*  ANIONGAP 21*    Recent Labs  Lab 02/05/23 1237  PROT 7.3  ALBUMIN 2.8*  AST 198*  ALT 37  ALKPHOS 80  BILITOT 0.8   Lipids No results for input(s): "CHOL", "TRIG", "HDL", "LABVLDL", "LDLCALC", "CHOLHDL" in the last 168 hours.  Hematology Recent Labs  Lab 02/05/23 1237  WBC 34.5*  RBC 3.51*  HGB 12.0*  HCT 36.5*  MCV 104.0*  MCH 34.2*  MCHC 32.9  RDW 14.7  PLT 352   Thyroid No results for input(s): "TSH", "FREET4" in the last 168 hours.  BNPNo results for input(s): "BNP", "PROBNP" in the last 168 hours.  DDimer  Recent Labs  Lab 02/05/23 1237  DDIMER 6.07*     Radiology/Studies:  CT Angio Chest/Abd/Pel for Dissection W and/or Wo Contrast Result Date: 02/05/2023 CLINICAL DATA:  Acute aortic syndrome suspected. Shortness of breath. EXAM: CT ANGIOGRAPHY CHEST, ABDOMEN AND PELVIS TECHNIQUE: Non-contrast CT of the chest was initially obtained. Multidetector CT imaging through the chest, abdomen and pelvis was performed using the standard protocol during bolus administration of intravenous contrast. Multiplanar reconstructed images and MIPs were  obtained and reviewed to evaluate the vascular anatomy. RADIATION DOSE REDUCTION: This exam was performed according to the departmental dose-optimization program which includes automated exposure control, adjustment of the mA and/or kV according to patient size and/or use of iterative reconstruction technique. CONTRAST:  OMNIPAQUE IOHEXOL 350 MG/ML SOLN COMPARISON:  Chest radiograph earlier today.  Chest CT 11/26/2022 FINDINGS: CTA CHEST FINDINGS Cardiovascular: No aortic hematoma on noncontrast exam. There is no dissection, aneurysm or acute aortic finding. Moderate aortic atherosclerosis and tortuosity. There is no central pulmonary embolus to the segmental level. The heart is normal in size. There are coronary artery calcifications. No pericardial effusion. Mediastinum/Nodes: 11 mm subcarinal node. No enlarged hilar lymph nodes. Axillary adenopathy. There is moderate diffuse esophageal wall thickening. Lungs/Pleura: Nodular area in the medial left lower lobe abutting the inferior hilum measuring 2.8 x 2 cm series 9, image 51. Mild soft tissue images there is central low-density series 7, image 57. This is increasing from prior exam. Stable irregular density in the left upper lobe adjacent to fiducial marker measuring 16 x 8 mm series 9, image 35. Posterior right apical scarring. Minimal patchy ground-glass in the anterior left upper lobe series 9, image 51 and right upper lobe series 9, image 40. This is new from prior exam. Increasing ground-glass and nodular opacities in the dependent  right lower lobe. Moderate emphysema. Lower lobe bronchial thickening with areas of mucoid impaction involving the segmental left lower lobe and subsegmental right lower lobe. No significant pleural effusion. Musculoskeletal: No suspicious bone lesion or acute osseous findings. Remote right rib fractures. Thoracic spondylosis. Review of the MIP images confirms the above findings. CTA ABDOMEN AND PELVIS FINDINGS VASCULAR  Aorta: Moderate atherosclerosis. Normal caliber aorta without aneurysm, dissection, vasculitis or significant stenosis. Celiac: Mild plaque at the origin with minor stenosis. The distal branches are patent. SMA: Noncalcified plaque in the proximal SMA spanning 15 mm causes approximately 50% luminal narrowing. The distal branches are patent. Replaced right hepatic artery arises from the SMA. . Renals: Single bilateral renal arteries. There is moderate plaque at the origin of the renal arteries. Mild stenosis on the right. The distal branches are patent. No dissection. IMA: Patent without evidence of aneurysm, dissection, vasculitis or significant stenosis. Inflow: Moderately calcified. Patent without evidence of aneurysm, dissection, vasculitis or significant stenosis. Lower extremity CT performed separately. Veins: No obvious venous abnormality within the limitations of this arterial phase study. Review of the MIP images confirms the above findings. NON-VASCULAR Hepatobiliary: No focal liver abnormality on this arterial phase exam. Unremarkable gallbladder. Pancreas: No ductal dilatation or inflammation. Spleen: Normal in size and arterial enhancement. Adrenals/Urinary Tract: No adrenal nodule. No hydronephrosis or suspicious renal abnormality on arterial phase imaging. Unremarkable urinary bladder. Stomach/Bowel: Wall thickening at the gastroesophageal junction. No bowel obstruction or inflammation. Normal appendix. Small to moderate colonic stool burden. There is moderate stool distending the rectum. Lymphatic: No lymphadenopathy. Reproductive: Large prostate spans 5.1 cm transverse. Other: No ascites or free air.  No abdominal wall hernia. Musculoskeletal: Postsurgical and degenerative change in the spine. No acute osseous findings or focal bone lesion. Review of the MIP images confirms the above findings. IMPRESSION: 1. No aortic dissection or aneurysm.  No central pulmonary embolus. 2. Increasing nodular area  in the medial left lower lobe abutting the inferior hilum measuring 2.8 x 2 cm. There is central low-density which may represent necrosis. This is suspicious for recurrent malignancy. Oncologic follow-up is recommended, consider PET. 3. Nonspecific 11 mm subcarinal lymph node. 4. Stable irregular density in the left upper lobe adjacent to fiducial marker measuring 16 x 8 mm. 5. Increasing ground-glass and nodular opacities in the dependent right lower lobe, new ground-glass in the anterior left upper lobe and right upper lobe. This is suspicious for multifocal pneumonia. 6. Lower lobe bronchial thickening with areas of mucoid impaction involving the segmental left lower lobe and subsegmental right lower lobe. 7. Moderate diffuse esophageal wall thickening, similar or minimally progressed from prior exam. Wall thickening at the gastroesophageal junction. Aortic Atherosclerosis (ICD10-I70.0) and Emphysema (ICD10-J43.9). Electronically Signed   By: Narda Rutherford M.D.   On: 02/05/2023 17:17   CT Head Wo Contrast Result Date: 02/05/2023 CLINICAL DATA:  Mental status change EXAM: CT HEAD WITHOUT CONTRAST TECHNIQUE: Contiguous axial images were obtained from the base of the skull through the vertex without intravenous contrast. RADIATION DOSE REDUCTION: This exam was performed according to the departmental dose-optimization program which includes automated exposure control, adjustment of the mA and/or kV according to patient size and/or use of iterative reconstruction technique. COMPARISON:  CT head 11/26/2022 FINDINGS: Brain: No intracranial hemorrhage, mass effect, or evidence of acute infarct. No hydrocephalus. No extra-axial fluid collection. Age-commensurate cerebral atrophy and chronic small vessel ischemic disease. Vascular: No hyperdense vessel. Intracranial arterial calcification. Skull: No fracture or focal lesion. Sinuses/Orbits: No acute finding.  Other: None. IMPRESSION: No acute intracranial abnormality.  Electronically Signed   By: Minerva Fester M.D.   On: 02/05/2023 15:42   DG Hand Complete Left Result Date: 02/05/2023 CLINICAL DATA:  Pain after fall EXAM: LEFT HAND - COMPLETE 3 VIEW COMPARISON:  None Available. FINDINGS: Osteopenia. No fracture or dislocation. Joint space loss and osteophytes seen of the interphalangeal joint of the thumb. Is also moderate joint space loss of the first carpometacarpal joint. Mild elsewhere. IMPRESSION: Osteopenia and degenerative changes. Electronically Signed   By: Karen Kays M.D.   On: 02/05/2023 14:43   DG Knee Complete 4 Views Left Result Date: 02/05/2023 CLINICAL DATA:  Fall and trauma to the left knee. EXAM: LEFT KNEE - COMPLETE 4+ VIEW COMPARISON:  None Available. FINDINGS: There is no acute fracture or dislocation. The bones are osteopenic. Mild arthritic changes of the knee. No joint effusion. The soft tissues are unremarkable. IMPRESSION: 1. No acute fracture or dislocation. 2. Mild arthritic changes. Electronically Signed   By: Elgie Collard M.D.   On: 02/05/2023 14:42   DG Chest Port 1 View Result Date: 02/05/2023 CLINICAL DATA:  Shortness of breath EXAM: PORTABLE CHEST 1 VIEW COMPARISON:  01/19/2023 FINDINGS: Hyperinflation. No consolidation, pneumothorax or effusion. No edema. Normal cardiopericardial silhouette. Calcified aorta. Overlapping cardiac leads. Surgical clips in the left hilum. Degenerative changes. IMPRESSION: Hyperinflation with chronic changes.  Surgical clips. Electronically Signed   By: Karen Kays M.D.   On: 02/05/2023 14:13   CT Cervical Spine Wo Contrast Result Date: 02/05/2023 CLINICAL DATA:  Neck trauma (Age >= 65y).  Mental status changes. EXAM: CT CERVICAL SPINE WITHOUT CONTRAST TECHNIQUE: Multidetector CT imaging of the cervical spine was performed without intravenous contrast. Multiplanar CT image reconstructions were also generated. RADIATION DOSE REDUCTION: This exam was performed according to the departmental  dose-optimization program which includes automated exposure control, adjustment of the mA and/or kV according to patient size and/or use of iterative reconstruction technique. COMPARISON:  None Available. FINDINGS: Alignment: Normal Skull base and vertebrae: No acute fracture. No primary bone lesion or focal pathologic process. Soft tissues and spinal canal: No prevertebral fluid or swelling. No visible canal hematoma. Disc levels: Degenerative disc disease changes in the mid to lower cervical spine with disc space narrowing and spurring. Moderate bilateral degenerative facet disease. Upper chest: Emphysema, biapical scarring. Other: None IMPRESSION: Degenerative disc and facet disease.  No acute bony abnormality. Electronically Signed   By: Charlett Nose M.D.   On: 02/05/2023 13:30        Signed,  Maisie Fus, MD    02/05/2023 5:46 PM      Assessment and Plan:   Elevated troponin Echo November 2024 showed LVEF of 60%, normal RV function and no valve disease. Suspect this is in the setting of rhabdo, acute renal failure.  He has no ischemic changes on his EKG. He was not having chest pain.  I have no plans to recommend an ischemic evaluation while he is inpatient.  Can consider outpatient nuclear study.  On heparin  RSV Supportive care per IM  Variable flutter/ Atrial Fibrillation Has Chads2vasc of 2; but CA can increase risk - cont heparin gtt  ?AIVR In the setting of acute renal failure   Time Spent Directly with Patient:  I have spent a total of 50 minutes with the patient reviewing hospital notes, telemetry, EKGs, labs and examining the patient as well as establishing an assessment and plan that was discussed personally with the patient.  Risk Assessment/Risk Scores:       For questions or updates, please contact [facility] Please consult [email or web address] for contact info under    Signed, Maisie Fus, MD  02/05/2023 5:20 PM

## 2023-02-05 NOTE — ED Notes (Signed)
Pt to CT with this RN, pt connected to cardiac monitor.

## 2023-02-05 NOTE — ED Notes (Signed)
Heparin restarted

## 2023-02-05 NOTE — Progress Notes (Addendum)
PHARMACY - ANTICOAGULATION CONSULT NOTE  Pharmacy Consult for heparin Indication: chest pain/ACS  Allergies  Allergen Reactions   Ropinirole Hcl Other (See Comments)    Confusion, agiitation, disorientation, Hallucinations   Tramadol     lethargy    Patient Measurements:   Heparin Dosing Weight: TBW  Vital Signs: Temp: 98.7 F (37.1 C) (01/27 1214) Temp Source: Oral (01/27 1214) BP: 101/64 (01/27 1330) Pulse Rate: 61 (01/27 1353)  Labs: Recent Labs    02/05/23 1237  HGB 12.0*  HCT 36.5*  PLT 352  CREATININE 3.18*  TROPONINIHS 1,867*    Estimated Creatinine Clearance: 16.1 mL/min (A) (by C-G formula based on SCr of 3.18 mg/dL (H)).   Medical History: Past Medical History:  Diagnosis Date   Arthritis    in his fingers   Bipolar disorder (HCC)    BPH (benign prostatic hyperplasia)    Cancer (HCC) 2020   LUNG CA   Depression    Emphysema    Episodic confusion    Possible bipolar disorder   GERD (gastroesophageal reflux disease)    History of chicken pox    History of hiatal hernia    HNP (herniated nucleus pulposus), lumbar    Hyperlipidemia    Lung abscess (HCC) 01/06/2019   Memory deficit 07/31/2012   OCD (obsessive compulsive disorder)    Peripheral neuropathy    Restless leg syndrome 01/10/2003   Unspecified psychosis 03/01/2012   Wears dentures    full upper and lower   Wears hearing aid in both ears     Assessment: 77 YOM presenting with SOB and fall at home, elevated troponin, he is not on anticoagulation PTA, chronic anemia stable, plts wnl  Goal of Therapy:  Heparin level 0.3-0.7 units/ml Monitor platelets by anticoagulation protocol: Yes   Plan:  Heparin 3500 units IV x 1, and gtt at 700 units/hr F/u 8 hour heparin level F/u cards eval and recs  Addendum: Now with concern for PE and V/Q ordered Will adj heparin gtt to 900 units/hr pending results  Daylene Posey, PharmD, [facility] Clinical Pharmacist ED Pharmacist Phone #  [phone] 02/05/2023 2:10 PM

## 2023-02-05 NOTE — ED Provider Notes (Addendum)
[facility] AT [facility] Provider Note   CSN: [id number] Arrival date & time: 02/05/23  1216     History  Chief Complaint  Patient presents with   Fall   Shortness of Breath    [name] is a 78 y.o. male.   Fall Associated symptoms include shortness of breath.  Shortness of Breath    78 year old male with medical history significant for dementia, episodic confusion, GERD, peripheral neuropathy, restless leg syndrome, HLD, depression, lung abscess, wedge resection of the lung presenting to the emergency department after being found down.  The patient had a reported ground-level fall at home and was lying on the floor for about 3 hours.  Family found him tachypneic on the floor and cyanotic with oxygen saturations 93% on room air.  The patient history is limited, when EMS arrived he was short of breath and EMS administered DuoNeb.  The patient was found to be in atrial flutter which is new.  The patient denied any chest pain.  He endorsed shortness of breath.  The patient was found to be pale and diaphoretic.  Unclear how long he was on the ground although her family stated about 3 hours.  On arrival, the patient's HPI was limited by his mental status, he complained of shortness of breath, denied chest pain or abdominal pain.  Home Medications Prior to Admission medications   Medication Sig Start Date End Date Taking? Authorizing Provider  aspirin 81 MG tablet Take 1 tablet (81 mg total) by mouth daily. 08/06/17   Malva Limes, MD  cholecalciferol (VITAMIN D3) 25 MCG (1000 UT) tablet Take 1,000 Units by mouth daily.    [provider]  Coenzyme Q10 (COQ10) 100 MG CAPS Take 100 mg by mouth daily.     [provider]  divalproex (DEPAKOTE ER) 500 MG 24 hr tablet Take 1,000 mg by mouth at bedtime.     [provider]  feeding supplement (ENSURE ENLIVE / ENSURE PLUS) LIQD Take 237 mLs by mouth 2 (two) times daily between  meals. 11/30/22   Delfino Lovett, MD  omeprazole (PRILOSEC) 40 MG capsule TAKE 1 CAPSULE BY MOUTH EVERY DAY 04/27/22   Malva Limes, MD  simvastatin (ZOCOR) 40 MG tablet TAKE 1 TABLET BY MOUTH EVERY DAY AT 6PM Patient taking differently: Take 40 mg by mouth daily. 04/27/22   Malva Limes, MD  umeclidinium bromide (INCRUSE ELLIPTA) 62.5 MCG/ACT AEPB Inhale 1 puff into the lungs daily. 11/08/22   Malva Limes, MD  vitamin B-12 (CYANOCOBALAMIN) 1000 MCG tablet Take 1,000 mcg by mouth daily.    [provider]  ziprasidone (GEODON) 40 MG capsule Take 40 mg by mouth daily with supper.     [provider]      Allergies    Ropinirole hcl and Tramadol    Review of Systems   Review of Systems  Unable to perform ROS: Dementia  Respiratory:  Positive for shortness of breath.     Physical Exam Updated Vital Signs BP 119/68   Pulse 60   Temp 98.9 F (37.2 C) (Oral)   Resp 19   SpO2 95%  Physical Exam Vitals and nursing note reviewed.  Constitutional:      Appearance: He is well-developed.     Comments: GCS 14, ABC intact  HENT:     Head: Normocephalic.  Eyes:     Conjunctiva/sclera: Conjunctivae normal.  Neck:     Comments: No midline tenderness  to palpation of the cervical spine. ROM intact. Cardiovascular:     Rate and Rhythm: Normal rate and regular rhythm.     Heart sounds: No murmur heard. Pulmonary:     Effort: Pulmonary effort is normal. No respiratory distress.     Breath sounds: Normal breath sounds.  Chest:     Comments: Chest wall stable and non-tender to AP and lateral compression. Clavicles stable and non-tender to AP compression Abdominal:     Palpations: Abdomen is soft.     Tenderness: There is no abdominal tenderness.     Comments: Pelvis stable to lateral compression.  Musculoskeletal:     Cervical back: Neck supple.     Comments: No midline tenderness to palpation of the thoracic or lumbar spine. Extremities atraumatic with intact ROM  with the exception of an abrasion to the left knee and hand  Skin:    General: Skin is warm and dry.  Neurological:     Mental Status: He is alert.     Comments: CN II-XII grossly intact. Moving all four extremities spontaneously and sensation grossly intact.     ED Results / Procedures / Treatments   Labs (all labs ordered are listed, but only abnormal results are displayed) Labs Reviewed  RESP PANEL BY RT-PCR (RSV, FLU A&B, COVID)  RVPGX2 - Abnormal; Notable for the following components:      Result Value   Resp Syncytial Virus by PCR POSITIVE (*)    All other components within normal limits  CBC WITH DIFFERENTIAL/PLATELET - Abnormal; Notable for the following components:   WBC 34.5 (*)    RBC 3.51 (*)    Hemoglobin 12.0 (*)    HCT 36.5 (*)    MCV 104.0 (*)    MCH 34.2 (*)    Neutro Abs 30.7 (*)    Monocytes Absolute 2.8 (*)    Basophils Absolute 0.3 (*)    All other components within normal limits  COMPREHENSIVE METABOLIC PANEL - Abnormal; Notable for the following components:   CO2 16 (*)    Glucose, Bld 106 (*)    BUN 26 (*)    Creatinine, Ser 3.18 (*)    Albumin 2.8 (*)    AST 198 (*)    GFR, Estimated 19 (*)    Anion gap 21 (*)    All other components within normal limits  D-DIMER, QUANTITATIVE - Abnormal; Notable for the following components:   D-Dimer, Quant 6.07 (*)    All other components within normal limits  MAGNESIUM - Abnormal; Notable for the following components:   Magnesium 2.6 (*)    All other components within normal limits  CK - Abnormal; Notable for the following components:   Total CK 7,533 (*)    All other components within normal limits  TROPONIN I (HIGH SENSITIVITY) - Abnormal; Notable for the following components:   Troponin I (High Sensitivity) 1,867 (*)    All other components within normal limits  TROPONIN I (HIGH SENSITIVITY) - Abnormal; Notable for the following components:   Troponin I (High Sensitivity) 2,607 (*)    All other  components within normal limits  HEPARIN LEVEL (UNFRACTIONATED)  HEPARIN LEVEL (UNFRACTIONATED)  CBC  TROPONIN I (HIGH SENSITIVITY)    EKG EKG Interpretation Date/Time:  Monday February 05 2023 12:18:15 EST Ventricular Rate:  64 PR Interval:    QRS Duration:  84 QT Interval:  446 QTC Calculation: 460 R Axis:   77  Text Interpretation: Atrial flutter Abnormal ECG When compared with  ECG of 26-Nov-2022 15:32, PREVIOUS ECG IS PRESENT Confirmed by Ernie Avena ([id number]) on 02/05/2023 1:00:47 PM  Radiology CT Head Wo Contrast Result Date: 02/05/2023 CLINICAL DATA:  Mental status change EXAM: CT HEAD WITHOUT CONTRAST TECHNIQUE: Contiguous axial images were obtained from the base of the skull through the vertex without intravenous contrast. RADIATION DOSE REDUCTION: This exam was performed according to the departmental dose-optimization program which includes automated exposure control, adjustment of the mA and/or kV according to patient size and/or use of iterative reconstruction technique. COMPARISON:  CT head 11/26/2022 FINDINGS: Brain: No intracranial hemorrhage, mass effect, or evidence of acute infarct. No hydrocephalus. No extra-axial fluid collection. Age-commensurate cerebral atrophy and chronic small vessel ischemic disease. Vascular: No hyperdense vessel. Intracranial arterial calcification. Skull: No fracture or focal lesion. Sinuses/Orbits: No acute finding. Other: None. IMPRESSION: No acute intracranial abnormality. Electronically Signed   By: Minerva Fester M.D.   On: 02/05/2023 15:42   DG Hand Complete Left Result Date: 02/05/2023 CLINICAL DATA:  Pain after fall EXAM: LEFT HAND - COMPLETE 3 VIEW COMPARISON:  None Available. FINDINGS: Osteopenia. No fracture or dislocation. Joint space loss and osteophytes seen of the interphalangeal joint of the thumb. Is also moderate joint space loss of the first carpometacarpal joint. Mild elsewhere. IMPRESSION: Osteopenia and degenerative changes.  Electronically Signed   By: Karen Kays M.D.   On: 02/05/2023 14:43   DG Knee Complete 4 Views Left Result Date: 02/05/2023 CLINICAL DATA:  Fall and trauma to the left knee. EXAM: LEFT KNEE - COMPLETE 4+ VIEW COMPARISON:  None Available. FINDINGS: There is no acute fracture or dislocation. The bones are osteopenic. Mild arthritic changes of the knee. No joint effusion. The soft tissues are unremarkable. IMPRESSION: 1. No acute fracture or dislocation. 2. Mild arthritic changes. Electronically Signed   By: Elgie Collard M.D.   On: 02/05/2023 14:42   DG Chest Port 1 View Result Date: 02/05/2023 CLINICAL DATA:  Shortness of breath EXAM: PORTABLE CHEST 1 VIEW COMPARISON:  01/19/2023 FINDINGS: Hyperinflation. No consolidation, pneumothorax or effusion. No edema. Normal cardiopericardial silhouette. Calcified aorta. Overlapping cardiac leads. Surgical clips in the left hilum. Degenerative changes. IMPRESSION: Hyperinflation with chronic changes.  Surgical clips. Electronically Signed   By: Karen Kays M.D.   On: 02/05/2023 14:13   CT Cervical Spine Wo Contrast Result Date: 02/05/2023 CLINICAL DATA:  Neck trauma (Age >= 65y).  Mental status changes. EXAM: CT CERVICAL SPINE WITHOUT CONTRAST TECHNIQUE: Multidetector CT imaging of the cervical spine was performed without intravenous contrast. Multiplanar CT image reconstructions were also generated. RADIATION DOSE REDUCTION: This exam was performed according to the departmental dose-optimization program which includes automated exposure control, adjustment of the mA and/or kV according to patient size and/or use of iterative reconstruction technique. COMPARISON:  None Available. FINDINGS: Alignment: Normal Skull base and vertebrae: No acute fracture. No primary bone lesion or focal pathologic process. Soft tissues and spinal canal: No prevertebral fluid or swelling. No visible canal hematoma. Disc levels: Degenerative disc disease changes in the mid to lower  cervical spine with disc space narrowing and spurring. Moderate bilateral degenerative facet disease. Upper chest: Emphysema, biapical scarring. Other: None IMPRESSION: Degenerative disc and facet disease.  No acute bony abnormality. Electronically Signed   By: Charlett Nose M.D.   On: 02/05/2023 13:30    Procedures .Critical Care  Performed by: Ernie Avena, MD Authorized by: Ernie Avena, MD   Critical care provider statement:    Critical care time (minutes):  30  Critical care was time spent personally by me on the following activities:  Development of treatment plan with patient or surrogate, discussions with consultants, evaluation of patient's response to treatment, examination of patient, ordering and review of laboratory studies, ordering and review of radiographic studies, ordering and performing treatments and interventions, pulse oximetry, re-evaluation of patient's condition and review of old charts     Medications Ordered in ED Medications  aspirin tablet 325 mg (325 mg Oral Given 02/05/23 1455)  heparin ADULT infusion 100 units/mL (25000 units/267mL) (0 Units/hr Intravenous Paused 02/05/23 1624)  Tdap (BOOSTRIX) injection 0.5 mL (0.5 mLs Intramuscular Given 02/05/23 1455)  heparin bolus via infusion 3,500 Units (3,500 Units Intravenous Bolus from Bag 02/05/23 1458)  sodium chloride 0.9 % bolus 1,000 mL (1,000 mLs Intravenous New Bag/Given 02/05/23 1605)    ED Course/ Medical Decision Making/ A&P Clinical Course as of 02/05/23 1705  Mon Feb 05, 2023  1342 WBC(!): 34.5 [JL]  1342 D-Dimer, Quant(!): 6.07 [JL]  1406 Troponin I (High Sensitivity)(!!): 1,867 [JL]  1639 Troponin I (High Sensitivity)(!!): 2,607 [JL]  1639 CK Total(!): 7,533 [JL]  1703 Respiratory Syncytial Virus by PCR(!): POSITIVE [JL]    Clinical Course User Index [JL] Ernie Avena, MD                                 Medical Decision Making Amount and/or Complexity of Data Reviewed Labs: ordered.  Decision-making details documented in ED Course. Radiology: ordered.  Risk OTC drugs. Prescription drug management. Decision regarding hospitalization.     78 year old male with medical history significant for dementia, episodic confusion, GERD, peripheral neuropathy, restless leg syndrome, HLD, depression, lung abscess, wedge resection of the lung presenting to the emergency department after being found down.  The patient had a reported ground-level fall at home and was lying on the floor for about 3 hours.  Family found him tachypneic on the floor and cyanotic with oxygen saturations 93% on room air.  The patient history is limited, when EMS arrived he was short of breath and EMS administered DuoNeb.  The patient was found to be in atrial flutter which is new.  The patient denied any chest pain.  He endorsed shortness of breath.  The patient was found to be pale and diaphoretic.  Unclear how long he was on the ground although her family stated about 3 hours.  On arrival, the patient's HPI was limited by his mental status, he complained of shortness of breath, denied chest pain or abdominal pain.  On arrival, the patient was afebrile, initially bradycardic, subsequently rate controlled atrial flutter seen on cardiac telemetry, saturating  100 percent on room air, BP 122/71.  Differential diagnose includes cardiac arrhythmia, syncopal episode, dehydration/hypotension, orthostatic hypotension, rhabdomyolysis, CVA, PE, ACS.  Patient denies any chest pain or abdominal pain, lower concern for aortic dissection.  Initial workup obtained to include EKG which revealed atrial flutter that was rate controlled, rate 64, no acute ischemic changes noted, laboratory evaluation revealed a magnesium of 2.6, CBC with a nonspecific leukocytosis to 34.5, hemoglobin 12, D-dimer elevated to 6.07, initial cardiac troponin elevated to 1867, and anion gap acidosis was noted with a bicarbonate of 16 and anion gap of 21 and  an AKI was noted with a serum creatinine of 3.18 from a baseline of 1, the patient was also found to have elevated LFTs with an AST of 198.  The patient remained fairly asymptomatic resting  comfortably in the emergency department with minimal complaints.  His family arrived bedside.  He was administered an NaCl bolus and his tetanus was updated given his abrasions noted on exam.  Given the patient's elevated cardiac troponin, concern for pulmonary embolism.  Patient cannot undergo CTA imaging due to his AKI, VQ scan initially ordered and the patient was started on heparin.   The patient's family arrived bedside and provided additional history.  They state that he been complaining of back pain yesterday.  The patient's pulses were checked and he was found to have asymmetric pulses with diminished DP pulses but palpable faintly and present by Doppler in the left lower extremity.  In the setting of the patient's back pain, questional syncopal episode being found down, elevated troponin D-dimer, considered aortic dissection. I have higher suspicion for PE/NSTEMI, however cannot fully rule out dissection. Heparin was stopped and CTA was ordered and family consented for CT. explained to the patient's sisters bedside the risk of worsening renal function in the setting of contrast administration however ruling out an emergent life-threatening medical condition is worth the risk of worsening renal function.  Repeat cardiac enzymes were pending, CK was pending, patient on repeat assessment was resting comfortably with minimal complaints.  Will obtain CT a of the chest and pelvis with runoff to the lower extremities bilaterally to rule out dissection. Repeat troponin climbing to 2607 concerning for NSTEMI. CK elevated to 7533 concerning for nontraumatic rhabdomyolysis.  Elevated troponin could be due to patient rhabdomyolysis.  Emergent CTA negative for dissection. Lung mass present per radiology wet read. Full read  pending.  CTA resulted negative for dissection, potential recurrence of lung mass/malignancy, no evidence of PE.  Patient cardiac troponins peaked and were downtrending on recheck.  Patient also found to have occlusive disease arterially in the left lower extremity however does have dopplerable pulse by PT and has two-vessel runoff.  Patient continued on heparin gtt. for admission.  Found to be RSV positive.  Signout given to Dr. Rosalia Hammers at 1600 pending results of diagnostic imaging, plan for likely admission.  Hospitalist medicine engaged for admission and cardiology consulted.  Final Clinical Impression(s) / ED Diagnoses Final diagnoses:  NSTEMI (non-ST elevated myocardial infarction) (HCC)  AKI (acute kidney injury) (HCC)  Non-traumatic rhabdomyolysis  RSV infection    Rx / DC Orders ED Discharge Orders     None         Ernie Avena, MD 02/05/23 1705    Ernie Avena, MD 02/05/23 2004    Ernie Avena, MD 02/05/23 2013

## 2023-02-05 NOTE — H&P (Signed)
[facility] History and Physical   Patient: [name] OZD:[id number]   PCP: Malva Limes, MD DOB: Dec 20, 1945   DOA: 02/05/2023   DOS: 02/05/2023   DOS: the patient was seen and examined on 02/05/2023  Patient coming from: The patient is coming from Home  Chief Complaint: Fall and weakness  HPI: [name] is a 78 y.o. male with PMH of COPD, bipolar, CVA, RLS, peripheral neuropathy, lumbar fusion, dementia, anxiety, fundoplication, BPH, lung cancer status post section in 2020, recently admitted from 11/17 to 11/30/2022 due to pneumonia as reviewed from EMR, presented with fall and weakness.  Patient is not a good historian, very hard of hearing and dementia.  HPI reviewed from EMR and patient's sister stated that patient is being following and unable to ambulate well due to weakness of bilateral lower extremities, patient is also complaining of backache.  Patient had mild cough, denies any chest pain or palpitations, no any other active issues except generalized weakness and unable to ambulate  ED Course: VS afebrile, HR 52-89, RR 17-26, BP 122/71, saturating well on room air, no hypoxia BMP: AKI, creatinine 3.18, metabolic acidosis CO2 16, AST 259 CK 7533 Troponin 2607 Procalcitonin 6.26 elevated WBC count 34K EKG atrial flutter HR 64 controlled CT head and CT C-spine negative CTA negative for PE, no aortic dissection.  Multifocal pneumonia and left lower lobe opacity possible reactivation of lung cancer.  Recommend PET scan as an outpatient   Review of Systems: as mentioned in the history of present illness.  All other systems reviewed and are negative.  Past Medical History:  Diagnosis Date   Arthritis    in his fingers   Bipolar disorder (HCC)    BPH (benign prostatic hyperplasia)    Cancer (HCC) 2020   LUNG CA   Depression    Emphysema    Episodic confusion    Possible bipolar disorder   GERD (gastroesophageal reflux disease)    History of chicken pox     History of hiatal hernia    HNP (herniated nucleus pulposus), lumbar    Hyperlipidemia    Lung abscess (HCC) 01/06/2019   Memory deficit 07/31/2012   OCD (obsessive compulsive disorder)    Peripheral neuropathy    Restless leg syndrome 01/10/2003   Unspecified psychosis 03/01/2012   Wears dentures    full upper and lower   Wears hearing aid in both ears    Past Surgical History:  Procedure Laterality Date   BACK SURGERY     COLONOSCOPY     ESOPHAGOGASTRODUODENOSCOPY     ESOPHAGOGASTRODUODENOSCOPY (EGD) WITH PROPOFOL [name] 02/28/2019   Procedure: ESOPHAGOGASTRODUODENOSCOPY (EGD) WITH DILATION;  Surgeon: Midge Minium, MD;  Location: [facility];  Service: Endoscopy;  Laterality: [name];  Priority 3   FLEXIBLE BRONCHOSCOPY [name] 01/06/2019   Procedure: FLEXIBLE BRONCHOSCOPY;  Surgeon: Hulda Marin, MD;  Location: [facility];  Service: Thoracic;  Laterality: [name];   liver excision     for non-cancerous mass   LUMBAR LAMINECTOMY/DECOMPRESSION MICRODISCECTOMY Left 06/22/2017   Procedure: Extraforaminal Microdiscectomy  - L4-L5 - left;  Surgeon: Tia Alert, MD;  Location: [facility] OR;  Service: Neurosurgery;  Laterality: Left;   LUNG SURGERY  2005   Lipoma Removal   MAXIMUM ACCESS (MAS) TRANSFORAMINAL LUMBAR INTERBODY FUSION (TLIF) 1 LEVEL [name] 02/18/2018   Procedure: Transforaminal Lumbar Interbody Fusion - Lumbar Four - Lumbar Five;  Surgeon: Tia Alert, MD;  Location: [facility] OR;  Service: Neurosurgery;  Laterality: [name];  Transforaminal  Lumbar Interbody Fusion - Lumbar Four - Lumbar Five   THORACOTOMY Right 01/06/2019   Procedure: THORACOTOMY MAJOR;  Surgeon: Hulda Marin, MD;  Location: [facility];  Service: Thoracic;  Laterality: Right;   VIDEO ASSISTED THORACOSCOPY (VATS)/WEDGE RESECTION Right 01/06/2019   Procedure: VIDEO ASSISTED THORACOSCOPY (VATS)/WEDGE RESECTION-RUL and RLL-lung resection;  Surgeon: Hulda Marin, MD;  Location: [facility] OR[facility];  Service: Thoracic;  Laterality: Right;   XI  ROBOTIC ASSISTED PARAESOPHAGEAL HERNIA REPAIR [name] 05/06/2019   Procedure: XI ROBOTIC ASSISTED PARAESOPHAGEAL HERNIA REPAIR;  Surgeon: Leafy Ro, MD;  Location: [facility];  Service: General;  Laterality: [name];   Social History:  reports that he quit smoking about 4 years ago. His smoking use included cigarettes. He started smoking about 54 years ago. He has a 37.5 pack-year smoking history. He has never used smokeless tobacco. He reports that he does not drink alcohol and does not use drugs.  Allergies  Allergen Reactions   Ropinirole Hcl Other (See Comments)    Confusion, agiitation, disorientation, Hallucinations   Tramadol     lethargy     Family history reviewed and not pertinent Family History  Problem Relation Age of Onset   Heart disease Mother    Cancer Father        lung   Heart disease Father    Diabetes Sister    Heart disease Sister    Seizures Paternal Grandfather      Prior to Admission medications   Medication Sig Start Date End Date Taking? Authorizing Provider  aspirin 81 MG tablet Take 1 tablet (81 mg total) by mouth daily. 08/06/17   Malva Limes, MD  cholecalciferol (VITAMIN D3) 25 MCG (1000 UT) tablet Take 1,000 Units by mouth daily.    [provider]  Coenzyme Q10 (COQ10) 100 MG CAPS Take 100 mg by mouth daily.     [provider]  divalproex (DEPAKOTE ER) 500 MG 24 hr tablet Take 1,000 mg by mouth at bedtime.     [provider]  feeding supplement (ENSURE ENLIVE / ENSURE PLUS) LIQD Take 237 mLs by mouth 2 (two) times daily between meals. 11/30/22   Delfino Lovett, MD  omeprazole (PRILOSEC) 40 MG capsule TAKE 1 CAPSULE BY MOUTH EVERY DAY 04/27/22   Malva Limes, MD  simvastatin (ZOCOR) 40 MG tablet TAKE 1 TABLET BY MOUTH EVERY DAY AT 6PM Patient taking differently: Take 40 mg by mouth daily. 04/27/22   Malva Limes, MD  umeclidinium bromide (INCRUSE ELLIPTA) 62.5 MCG/ACT AEPB Inhale 1 puff into the lungs daily. 11/08/22    Malva Limes, MD  vitamin B-12 (CYANOCOBALAMIN) 1000 MCG tablet Take 1,000 mcg by mouth daily.    [provider]  ziprasidone (GEODON) 40 MG capsule Take 40 mg by mouth daily with supper.     [provider]    Physical Exam: Vitals:   02/05/23 1730 02/05/23 1745 02/05/23 1800 02/05/23 1830  BP: 135/73     Pulse: 85 86 83 83  Resp: (!) 22 (!) 21 17 (!) 25  Temp:      TempSrc:      SpO2: 96% 94% 96% 97%    General: alert and oriented to time, place, and person. Appear in mild distress, affect appropriate Eyes: PERRLA, Conjunctiva normal ENT: Oral Mucosa Clear, moist  Neck: no JVD, no Abnormal Mass Or lumps Cardiovascular: S1 and S2 Present, no Murmur, peripheral pulses symmetrical Respiratory: good respiratory effort, Bilateral Air entry equal and Decreased, no signs  of accessory muscle use, Clear to Auscultation, no Crackles, no wheezes Abdomen: Bowel Sound present, Soft and no tenderness, no hernia Skin: no rashes  Extremities: no Pedal edema, no calf tenderness Neurologic: without any new focal findings Gait not checked due to patient safety concerns  Data Reviewed: I have personally reviewed and interpreted labs, imaging as discussed below.  CBC: Recent Labs  Lab 02/05/23 1237  WBC 34.5*  NEUTROABS 30.7*  HGB 12.0*  HCT 36.5*  MCV 104.0*  PLT 352   Basic Metabolic Panel: Recent Labs  Lab 02/05/23 1237  NA 145  K 4.1  CL 108  CO2 16*  GLUCOSE 106*  BUN 26*  CREATININE 3.18*  CALCIUM 9.9  MG 2.6*   GFR: Estimated Creatinine Clearance: 16.1 mL/min (A) (by C-G formula based on SCr of 3.18 mg/dL (H)). Liver Function Tests: Recent Labs  Lab 02/05/23 1237  AST 198*  ALT 37  ALKPHOS 80  BILITOT 0.8  PROT 7.3  ALBUMIN 2.8*   No results for input(s): "LIPASE", "AMYLASE" in the last 168 hours. No results for input(s): "AMMONIA" in the last 168 hours. Coagulation Profile: No results for input(s): "INR", "PROTIME" in the last 168  hours. Cardiac Enzymes: Recent Labs  Lab 02/05/23 1419  CKTOTAL 7,533*   BNP (last 3 results) No results for input(s): "PROBNP" in the last 8760 hours. HbA1C: No results for input(s): "HGBA1C" in the last 72 hours. CBG: No results for input(s): "GLUCAP" in the last 168 hours. Lipid Profile: No results for input(s): "CHOL", "HDL", "LDLCALC", "TRIG", "CHOLHDL", "LDLDIRECT" in the last 72 hours. Thyroid Function Tests: No results for input(s): "TSH", "T4TOTAL", "FREET4", "T3FREE", "THYROIDAB" in the last 72 hours. Anemia Panel: No results for input(s): "VITAMINB12", "FOLATE", "FERRITIN", "TIBC", "IRON", "RETICCTPCT" in the last 72 hours. Urine analysis:    Component Value Date/Time   COLORURINE YELLOW (A) 11/27/2022 0340   APPEARANCEUR HAZY (A) 11/27/2022 0340   APPEARANCEUR Hazy 07/20/2012 1840   LABSPEC 1.022 11/27/2022 0340   LABSPEC 1.024 07/20/2012 1840   PHURINE 5.0 11/27/2022 0340   GLUCOSEU >=500 (A) 11/27/2022 0340   GLUCOSEU Negative 07/20/2012 1840   HGBUR NEGATIVE 11/27/2022 0340   BILIRUBINUR Negative 01/19/2023 1314   BILIRUBINUR Negative 07/20/2012 1840   KETONESUR NEGATIVE 11/27/2022 0340   PROTEINUR Negative 01/19/2023 1314   PROTEINUR NEGATIVE 11/27/2022 0340   UROBILINOGEN 0.2 01/19/2023 1314   UROBILINOGEN 1.0 06/23/2008 2233   NITRITE Negative 01/19/2023 1314   NITRITE NEGATIVE 11/27/2022 0340   LEUKOCYTESUR Negative 01/19/2023 1314   LEUKOCYTESUR NEGATIVE 11/27/2022 0340   LEUKOCYTESUR Trace 07/20/2012 1840    Radiological Exams on Admission: CT ANGIO LOWER EXT BILAT W &/OR WO CONTRAST Result Date: 02/05/2023 CLINICAL DATA:  Claudication or leg ischemia Assymmetric pulses, hx of claudication in the left leg EXAM: CT ANGIOGRAPHY OF BILATERAL LOWER EXTREMITY TECHNIQUE: Multidetector CT imaging of the lower extremities was performed using the standard protocol during bolus administration of intravenous contrast. Multiplanar CT image reconstructions and  MIPs were obtained to evaluate the vascular anatomy. RADIATION DOSE REDUCTION: This exam was performed according to the departmental dose-optimization program which includes automated exposure control, adjustment of the mA and/or kV according to patient size and/or use of iterative reconstruction technique. CONTRAST:  OMNIPAQUE IOHEXOL 350 MG/ML SOLN COMPARISON:  None Available. FINDINGS: RIGHT Lower Extremity Inflow: Common, internal and external iliac arteries are patent without evidence of aneurysm, dissection, or vasculitis. The common and external iliac arteries are moderately calcified. Outflow: 1 calcified plaque causes  approximately high-grade the profundus femoral artery is patent. Proximal superficial femoral artery is narrowed and small in caliber. Moderate plaque throughout the superficial femoral artery with areas of intermittent narrowing. Moderate narrowing of the popliteal due to calcified plaque. Stenosis in the proximal common femoral artery. Runoff: The calf vessels are difficult to accurately assess due to calcifications and small caliber. There is 2 caliber runoff to the ankle. LEFT Lower Extremity Inflow: Common, internal and external iliac arteries are patent without evidence of aneurysm, dissection, or vasculitis. Moderate plaque involving the common and proximal external iliac artery. Outflow: Bulky plaque at the common femoral artery crosses high-grade stenosis. The profundus femoral artery is patent. The superficial femoral artery is densely calcified and diminutive. Superficial femoral artery is occluded over the course of 6 cm, series 3, image 119-149. There is 30 distal reconstitution within a diminutive superficial femoral artery distally. The popliteal artery is severely diseased and small in caliber. Runoff: Calf vessels are difficult to accurately assess due to calcifications and small caliber there is 2 vessel runoff to the ankle. Veins: No obvious venous abnormality within  the limitations of this arterial phase study. Review of the MIP images confirms the above findings. NON-VASCULAR No acute intramuscular findings. Acute osseous findings. Mild degenerative change of both knees. IMPRESSION: 1. Occlusion of the left superficial femoral artery over the course of 6 cm. There is distal reconstitution within a diminutive superficial femoral artery distally. The popliteal artery is severely diseased and small in caliber. 2. High-grade stenosis of the left common femoral artery. 3. High-grade stenosis of the proximal right common femoral artery. 4. Moderate plaque throughout the right superficial femoral artery with areas of intermittent narrowing. Moderate narrowing of the right popliteal artery. 5. Calf vessels are difficult to accurately assess due to calcifications and small caliber. There is 2 vessel runoff to the ankle bilaterally. These results were called by telephone at the time of interpretation on 02/05/2023 at 5:27 pm to provider RAY, who verbally acknowledged these results. Electronically Signed   By: Narda Rutherford M.D.   On: 02/05/2023 17:27   CT Angio Chest/Abd/Pel for Dissection W and/or Wo Contrast Result Date: 02/05/2023 CLINICAL DATA:  Acute aortic syndrome suspected. Shortness of breath. EXAM: CT ANGIOGRAPHY CHEST, ABDOMEN AND PELVIS TECHNIQUE: Non-contrast CT of the chest was initially obtained. Multidetector CT imaging through the chest, abdomen and pelvis was performed using the standard protocol during bolus administration of intravenous contrast. Multiplanar reconstructed images and MIPs were obtained and reviewed to evaluate the vascular anatomy. RADIATION DOSE REDUCTION: This exam was performed according to the departmental dose-optimization program which includes automated exposure control, adjustment of the mA and/or kV according to patient size and/or use of iterative reconstruction technique. CONTRAST:  OMNIPAQUE IOHEXOL 350 MG/ML SOLN COMPARISON:   Chest radiograph earlier today.  Chest CT 11/26/2022 FINDINGS: CTA CHEST FINDINGS Cardiovascular: No aortic hematoma on noncontrast exam. There is no dissection, aneurysm or acute aortic finding. Moderate aortic atherosclerosis and tortuosity. There is no central pulmonary embolus to the segmental level. The heart is normal in size. There are coronary artery calcifications. No pericardial effusion. Mediastinum/Nodes: 11 mm subcarinal node. No enlarged hilar lymph nodes. Axillary adenopathy. There is moderate diffuse esophageal wall thickening. Lungs/Pleura: Nodular area in the medial left lower lobe abutting the inferior hilum measuring 2.8 x 2 cm series 9, image 51. Mild soft tissue images there is central low-density series 7, image 57. This is increasing from prior exam. Stable irregular density in the left upper  lobe adjacent to fiducial marker measuring 16 x 8 mm series 9, image 35. Posterior right apical scarring. Minimal patchy ground-glass in the anterior left upper lobe series 9, image 51 and right upper lobe series 9, image 40. This is new from prior exam. Increasing ground-glass and nodular opacities in the dependent right lower lobe. Moderate emphysema. Lower lobe bronchial thickening with areas of mucoid impaction involving the segmental left lower lobe and subsegmental right lower lobe. No significant pleural effusion. Musculoskeletal: No suspicious bone lesion or acute osseous findings. Remote right rib fractures. Thoracic spondylosis. Review of the MIP images confirms the above findings. CTA ABDOMEN AND PELVIS FINDINGS VASCULAR Aorta: Moderate atherosclerosis. Normal caliber aorta without aneurysm, dissection, vasculitis or significant stenosis. Celiac: Mild plaque at the origin with minor stenosis. The distal branches are patent. SMA: Noncalcified plaque in the proximal SMA spanning 15 mm causes approximately 50% luminal narrowing. The distal branches are patent. Replaced right hepatic artery arises  from the SMA. . Renals: Single bilateral renal arteries. There is moderate plaque at the origin of the renal arteries. Mild stenosis on the right. The distal branches are patent. No dissection. IMA: Patent without evidence of aneurysm, dissection, vasculitis or significant stenosis. Inflow: Moderately calcified. Patent without evidence of aneurysm, dissection, vasculitis or significant stenosis. Lower extremity CT performed separately. Veins: No obvious venous abnormality within the limitations of this arterial phase study. Review of the MIP images confirms the above findings. NON-VASCULAR Hepatobiliary: No focal liver abnormality on this arterial phase exam. Unremarkable gallbladder. Pancreas: No ductal dilatation or inflammation. Spleen: Normal in size and arterial enhancement. Adrenals/Urinary Tract: No adrenal nodule. No hydronephrosis or suspicious renal abnormality on arterial phase imaging. Unremarkable urinary bladder. Stomach/Bowel: Wall thickening at the gastroesophageal junction. No bowel obstruction or inflammation. Normal appendix. Small to moderate colonic stool burden. There is moderate stool distending the rectum. Lymphatic: No lymphadenopathy. Reproductive: Large prostate spans 5.1 cm transverse. Other: No ascites or free air.  No abdominal wall hernia. Musculoskeletal: Postsurgical and degenerative change in the spine. No acute osseous findings or focal bone lesion. Review of the MIP images confirms the above findings. IMPRESSION: 1. No aortic dissection or aneurysm.  No central pulmonary embolus. 2. Increasing nodular area in the medial left lower lobe abutting the inferior hilum measuring 2.8 x 2 cm. There is central low-density which may represent necrosis. This is suspicious for recurrent malignancy. Oncologic follow-up is recommended, consider PET. 3. Nonspecific 11 mm subcarinal lymph node. 4. Stable irregular density in the left upper lobe adjacent to fiducial marker measuring 16 x 8 mm. 5.  Increasing ground-glass and nodular opacities in the dependent right lower lobe, new ground-glass in the anterior left upper lobe and right upper lobe. This is suspicious for multifocal pneumonia. 6. Lower lobe bronchial thickening with areas of mucoid impaction involving the segmental left lower lobe and subsegmental right lower lobe. 7. Moderate diffuse esophageal wall thickening, similar or minimally progressed from prior exam. Wall thickening at the gastroesophageal junction. Aortic Atherosclerosis (ICD10-I70.0) and Emphysema (ICD10-J43.9). Electronically Signed   By: Narda Rutherford M.D.   On: 02/05/2023 17:17   CT Head Wo Contrast Result Date: 02/05/2023 CLINICAL DATA:  Mental status change EXAM: CT HEAD WITHOUT CONTRAST TECHNIQUE: Contiguous axial images were obtained from the base of the skull through the vertex without intravenous contrast. RADIATION DOSE REDUCTION: This exam was performed according to the departmental dose-optimization program which includes automated exposure control, adjustment of the mA and/or kV according to patient size and/or use  of iterative reconstruction technique. COMPARISON:  CT head 11/26/2022 FINDINGS: Brain: No intracranial hemorrhage, mass effect, or evidence of acute infarct. No hydrocephalus. No extra-axial fluid collection. Age-commensurate cerebral atrophy and chronic small vessel ischemic disease. Vascular: No hyperdense vessel. Intracranial arterial calcification. Skull: No fracture or focal lesion. Sinuses/Orbits: No acute finding. Other: None. IMPRESSION: No acute intracranial abnormality. Electronically Signed   By: Minerva Fester M.D.   On: 02/05/2023 15:42   DG Hand Complete Left Result Date: 02/05/2023 CLINICAL DATA:  Pain after fall EXAM: LEFT HAND - COMPLETE 3 VIEW COMPARISON:  None Available. FINDINGS: Osteopenia. No fracture or dislocation. Joint space loss and osteophytes seen of the interphalangeal joint of the thumb. Is also moderate joint space  loss of the first carpometacarpal joint. Mild elsewhere. IMPRESSION: Osteopenia and degenerative changes. Electronically Signed   By: Karen Kays M.D.   On: 02/05/2023 14:43   DG Knee Complete 4 Views Left Result Date: 02/05/2023 CLINICAL DATA:  Fall and trauma to the left knee. EXAM: LEFT KNEE - COMPLETE 4+ VIEW COMPARISON:  None Available. FINDINGS: There is no acute fracture or dislocation. The bones are osteopenic. Mild arthritic changes of the knee. No joint effusion. The soft tissues are unremarkable. IMPRESSION: 1. No acute fracture or dislocation. 2. Mild arthritic changes. Electronically Signed   By: Elgie Collard M.D.   On: 02/05/2023 14:42   DG Chest Port 1 View Result Date: 02/05/2023 CLINICAL DATA:  Shortness of breath EXAM: PORTABLE CHEST 1 VIEW COMPARISON:  01/19/2023 FINDINGS: Hyperinflation. No consolidation, pneumothorax or effusion. No edema. Normal cardiopericardial silhouette. Calcified aorta. Overlapping cardiac leads. Surgical clips in the left hilum. Degenerative changes. IMPRESSION: Hyperinflation with chronic changes.  Surgical clips. Electronically Signed   By: Karen Kays M.D.   On: 02/05/2023 14:13   CT Cervical Spine Wo Contrast Result Date: 02/05/2023 CLINICAL DATA:  Neck trauma (Age >= 65y).  Mental status changes. EXAM: CT CERVICAL SPINE WITHOUT CONTRAST TECHNIQUE: Multidetector CT imaging of the cervical spine was performed without intravenous contrast. Multiplanar CT image reconstructions were also generated. RADIATION DOSE REDUCTION: This exam was performed according to the departmental dose-optimization program which includes automated exposure control, adjustment of the mA and/or kV according to patient size and/or use of iterative reconstruction technique. COMPARISON:  None Available. FINDINGS: Alignment: Normal Skull base and vertebrae: No acute fracture. No primary bone lesion or focal pathologic process. Soft tissues and spinal canal: No prevertebral fluid or  swelling. No visible canal hematoma. Disc levels: Degenerative disc disease changes in the mid to lower cervical spine with disc space narrowing and spurring. Moderate bilateral degenerative facet disease. Upper chest: Emphysema, biapical scarring. Other: None IMPRESSION: Degenerative disc and facet disease.  No acute bony abnormality. Electronically Signed   By: Charlett Nose M.D.   On: 02/05/2023 13:30   EKG: Independently reviewed.  Atrial flutter heart rate 64  Echocardiogram: 11/27/2022 TTE LVEF 55 to 60%, grade 1 diastolic dysfunction.  I reviewed all nursing notes, pharmacy notes, vitals, pertinent old records.  Assessment/Plan 1. NSTEMI (non-ST elevated myocardial infarction) (HCC)  Principal Problem:   NSTEMI (non-ST elevated myocardial infarction) (HCC) Active Problems:   Multifocal pneumonia   RSV (respiratory syncytial virus pneumonia)   AKI (acute kidney injury) (HCC)   Rhabdomyolysis   Metabolic acidosis   # Non-STEMI, most likely demand ischemia EKG nonischemic, Troponin peak 2607--trending down Continue aspirin 81 mg p.o. daily Continue heparin IV infusion Cardiology consulted, recommended no ischemic workup at this time.  Possible Lexiscan as  an outpatient Continue to monitor on telemetry  # Atrial flutter, paroxysmal, currently in sinus rhythm Continue to monitor on telemetry  # Sepsis due to Multifocal pneumonia, POA Sepsis criteria: Tachypneic RR 26 max, leukocytosis and pneumonia procalcitonin level Follow lactic acid level Started empiric antibiotics ceftriaxone and azithromycin Mucinex 600 mg p.o. twice daily, Tussionex as needed Follow blood cultures   # RSV positive No respiratory symptoms Continue symptomatic treatment  # AKI with metabolic acidosis most likely due to rhabdomyolysis Started bicarb IV infusion Monitor BMP daily Monitor urine output Avoid nephrotoxic medications, use renally dose medications  # Rhabdomyolysis CK level  7533 Continue IV bicarbonate infusion, switch to NS tomorrow a.m. Trend CK level Held statin for now  # History of bipolar disorder, dementia and anxiety Resumed home meds Geodon 40 mg p.o. nightly and Depakote 1000 mg at bedtime  # COPD, resumed Incruse Ellipta home inhaler DuoNeb as needed  # History of lung cancer, stable dissection in 2020 CTA chest shows possible recurrence of lung cancer Recommend to follow with oncology as an outpatient to repeat PET scan for further management.  # BPH, not on any medication. Check bladder scan as needed  # GERD on PPI  Nutrition: Regular diet DVT Prophylaxis: Therapeutic Anticoagulation with heparin IV infusion  Advance goals of care discussion: DNR   Consults: Cardiology  Family Communication: family was present at bedside, at the time of interview.  Opportunity was given to ask question and all questions were answered satisfactorily.  Disposition: Admitted as inpatient, cardiac telemetry unit. Likely to be discharged SNF, in 25-3 days when stable.  I have discussed plan of care as described above with RN and patient/family.  Severity of Illness: The appropriate patient status for this patient is INPATIENT. Inpatient status is judged to be reasonable and necessary in order to provide the required intensity of service to ensure the patient's safety. The patient's presenting symptoms, physical exam findings, and initial radiographic and laboratory data in the context of their chronic comorbidities is felt to place them at high risk for further clinical deterioration. Furthermore, it is not anticipated that the patient will be medically stable for discharge from the hospital within 2 midnights of admission.   * I certify that at the point of admission it is my clinical judgment that the patient will require inpatient hospital care spanning beyond 2 midnights from the point of admission due to high intensity of service, high risk for further  deterioration and high frequency of surveillance required.*   Total time spent: 80 minutes  Author: Gillis Santa, MD [facility] 02/05/2023 7:50 PM   To reach On-call, see care teams to locate the attending and reach out to them via [email or web address]. If 7PM-7AM, please contact night-coverage If you still have difficulty reaching the attending provider, please page the [facility] (Director on Call) for [facility] on amion for assistance.

## 2023-02-05 NOTE — ED Provider Notes (Signed)
  Physical Exam  BP 101/64   Pulse 61   Temp 98.7 F (37.1 C) (Oral)   Resp (!) 22   SpO2 98%   Physical Exam  Procedures  Procedures  ED Course / MDM   Clinical Course as of 02/06/23 1129  Mon Feb 05, 2023  1342 WBC(!): 34.5 [JL]  1342 D-Dimer, Quant(!): 6.07 [JL]  1406 Troponin I (High Sensitivity)(!!): 1,867 [JL]  1639 Troponin I (High Sensitivity)(!!): 2,607 [JL]  1639 CK Total(!): 7,533 [JL]  1703 Respiratory Syncytial Virus by PCR(!): POSITIVE [JL]    Clinical Course User Index [JL] Ernie Avena, MD   Medical Decision Making Amount and/or Complexity of Data Reviewed Labs: ordered. Decision-making details documented in ED Course. Radiology: ordered.  Risk OTC drugs. Prescription drug management. Decision regarding hospitalization.   78 yo male with syncopal episode found on ground for about 3 hours, cyanotic, tachypneic, atrial flutter rate controlled. Started on heparin for nstemi.  CT head pending.  CTA unable to get due to elevated creatinine. Leukocytosis 35,000 cxr clear, no fever, no hypotension. HR in 60s On reevaluation with Dr. Karene Fry, patient described pain radiating through to back and back pain yesterday.  Physical exam noted decreased pulses in both his feet but no signs of overt ischemia. Dr. Karene Fry subsequently ordered CT angio chest abdomen pelvis Patient with significantly elevated troponin and treated for NSTEMI Respiratory panel returned positive for RSV Here in ED patient remained hemodynamically stable with normal heart rate.  He did require 2 L of oxygen with sats down to 92% on room air.  He remained afebrile 1 fall versus syncope 2 elevated troponins consistent with NSTEMI 3 total CK elevated at 15,216 consistent with rhabdo 4 AKI likely secondary to volume depletion and consider secondary to rhabdo 5 leukocytosis question secondary to RSV.  CTA without any evidence of acute dissection Discussed with admitting hospitalist will admit  for ongoing evaluation and treatment  Admission to step down        Margarita Grizzle, MD 02/06/23 1136

## 2023-02-05 NOTE — ED Triage Notes (Signed)
Patient had ground level fall at home, lying on the floor for about three hours. Family found him tachypneic on the floor, cyanotic, SpO2 93% room air. Pt in aflutter with no hx of same.

## 2023-02-06 ENCOUNTER — Telehealth: Payer: Self-pay | Admitting: Cardiology

## 2023-02-06 DIAGNOSIS — I214 Non-ST elevation (NSTEMI) myocardial infarction: Secondary | ICD-10-CM | POA: Diagnosis not present

## 2023-02-06 DIAGNOSIS — R7989 Other specified abnormal findings of blood chemistry: Secondary | ICD-10-CM

## 2023-02-06 LAB — CK: Total CK: 15216 U/L — ABNORMAL HIGH (ref 49–397)

## 2023-02-06 LAB — TROPONIN I (HIGH SENSITIVITY): Troponin I (High Sensitivity): 5300 ng/L (ref <18)

## 2023-02-06 LAB — CBC
HCT: 30.4 % — ABNORMAL LOW (ref 39.0–52.0)
Hemoglobin: 10.4 g/dL — ABNORMAL LOW (ref 13.0–17.0)
MCH: 35 pg — ABNORMAL HIGH (ref 26.0–34.0)
MCHC: 34.2 g/dL (ref 30.0–36.0)
MCV: 102.4 fL — ABNORMAL HIGH (ref 80.0–100.0)
Platelets: 206 10*3/uL (ref 150–400)
RBC: 2.97 MIL/uL — ABNORMAL LOW (ref 4.22–5.81)
RDW: 14.8 % (ref 11.5–15.5)
WBC: 26.5 10*3/uL — ABNORMAL HIGH (ref 4.0–10.5)
nRBC: 0 % (ref 0.0–0.2)

## 2023-02-06 LAB — HEPATIC FUNCTION PANEL
ALT: 85 U/L — ABNORMAL HIGH (ref 0–44)
AST: 544 U/L — ABNORMAL HIGH (ref 15–41)
Albumin: 2.3 g/dL — ABNORMAL LOW (ref 3.5–5.0)
Alkaline Phosphatase: 64 U/L (ref 38–126)
Bilirubin, Direct: 0.2 mg/dL (ref 0.0–0.2)
Indirect Bilirubin: 0.3 mg/dL (ref 0.3–0.9)
Total Bilirubin: 0.5 mg/dL (ref 0.0–1.2)
Total Protein: 6 g/dL — ABNORMAL LOW (ref 6.5–8.1)

## 2023-02-06 LAB — BASIC METABOLIC PANEL
Anion gap: 10 (ref 5–15)
BUN: 47 mg/dL — ABNORMAL HIGH (ref 8–23)
CO2: 22 mmol/L (ref 22–32)
Calcium: 8.1 mg/dL — ABNORMAL LOW (ref 8.9–10.3)
Chloride: 105 mmol/L (ref 98–111)
Creatinine, Ser: 2.47 mg/dL — ABNORMAL HIGH (ref 0.61–1.24)
GFR, Estimated: 26 mL/min — ABNORMAL LOW (ref >60)
Glucose, Bld: 88 mg/dL (ref 70–99)
Potassium: 4.2 mmol/L (ref 3.5–5.1)
Sodium: 143 mmol/L (ref 135–145)

## 2023-02-06 LAB — HEPARIN LEVEL (UNFRACTIONATED): Heparin Unfractionated: 0.18 [IU]/mL — ABNORMAL LOW (ref 0.30–0.70)

## 2023-02-06 LAB — MAGNESIUM: Magnesium: 2.3 mg/dL (ref 1.7–2.4)

## 2023-02-06 LAB — PHOSPHORUS: Phosphorus: 5.3 mg/dL — ABNORMAL HIGH (ref 2.5–4.6)

## 2023-02-06 MED ORDER — HEPARIN (PORCINE) 25000 UT/250ML-% IV SOLN
INTRAVENOUS | Status: AC
  Filled 2023-02-06: qty 250

## 2023-02-06 MED ORDER — APIXABAN 2.5 MG PO TABS
2.5000 mg | ORAL_TABLET | Freq: Two times a day (BID) | ORAL | Status: DC
Start: 2023-02-06 — End: 2023-02-08
  Administered 2023-02-06 – 2023-02-08 (×5): 2.5 mg via ORAL
  Filled 2023-02-06 (×5): qty 1

## 2023-02-06 MED ORDER — HEPARIN BOLUS VIA INFUSION
1000.0000 [IU] | Freq: Once | INTRAVENOUS | Status: AC
  Administered 2023-02-06: 1000 [IU] via INTRAVENOUS
  Filled 2023-02-06: qty 1000

## 2023-02-06 NOTE — Consult Note (Addendum)
WOC Nurse Consult Note: Reason for Consult: Consult requested for left heel.  Performed remotely after review of progress notes and photo in the EMR.  Left heel with dark red-purple Deep tissue pressure injury, with yellow fluid filled intact blister to heel and surrounding left ankle. Deep tissue pressure injuries are high risk to evolve into full thickness tissue loss within 7-10 days.   Pressure Injury POA: Yes Dressing procedure/placement/frequency: Topical treatment orders provided for bedside nurses to perform as follows: Float heel in Prevalon boot to reduce pressure. Apply Xeroform gauze to left foot/heel Q day and cover with ABD pad and kerlex. Please re-consult if further assistance is needed.  Thank-you,  Cammie Mcgee MSN, RN, CWOCN, [place], CNS [phone]

## 2023-02-06 NOTE — Progress Notes (Signed)
[facility] night cross cover note:   I was notified by RN of the patient's most recent bladder scan showing greater than 650 cc.  The patient conveys some lower abdominal pressure and urgency, but has been unable to spontaneously urinate.  He had with straight cath x 1 now as well as existing order for acute 4-hour postvoid residual bladder scans with prn straight cath for ensuing postvoid residual greater than 400 cc.     Newton Pigg, DO Hospitalist

## 2023-02-06 NOTE — Hospital Course (Addendum)
[name] is a 78 y.o. male with PMH of COPD, bipolar disorder, CVA, peripheral neuropathy, dementia, BPH, lung cancer s/p resection who was recently admitted hospital from 11/17 to 11/30/2022 due to pneumonia presented this time to the hospital with fall and weakness with difficulty ambulation and backache.  In the ED patient had stable vitals.  Lab showed creatinine elevation at 3.1 with AST elevation at  198. CK 7533, troponin was 02/14/2005.  Procalcitonin elevated at 6.2.  WBC elevated at 34 K. EKG atrial flutter HR 64 controlled.  CT scan of the head and CT C-spine negative.  CT of the chest was negative for PE, no aortic dissection.  Multifocal pneumonia and left lower lobe opacity possible reactivation of lung cancer.  Recommend PET scan as an outpatient  Non-STEMI, most likely demand ischemia EKG did not show any ischemic changes.  With significant troponin elevation but trending down.  On aspirin. Cardiology consulted and recommend outpatient stress test.  Atrial flutter, paroxysmal, currently in sinus rhythm - continue Eliquis    Sepsis due to Multifocal pneumonia Was tachypneic with leukocytosis and imaging showed multifocal pneumonia.  Procalcitonin was significantly elevated at 6.2.  Respiratory viral panel was positive for respiratory syncytial virus.  Has been empirically treated with abx.    RSV positive Symptomatic care.   AKI with metabolic acidosis most likely due to rhabdomyolysis S/p bicarb infusion.   - slowly improving   PAD - severe B/L LE PAD noted on CTA - discussed with his sisters; they wish to see how the does on Eliquis but understand he's likely a poor invasive candidate   Rhabdomyolysis CK level 7533 on presentation.  S/p IV fluids. continue to hold statin for now. - trending down   History of bipolar disorder, dementia and anxiety Continue Geodon 40 mg p.o. nightly and Depakote 1000 mg at bedtime   COPD, resumed Incruse Ellipta home inhaler  continue DuoNebs as needed   History of lung cancer, history of resection in 2020. CTA chest shows possible recurrence of lung cancer Recommend to follow with oncology as an outpatient to repeat PET scan for further management.   BPH, not on any medication.  Check bladder scan if needed.   GERD continue PPI

## 2023-02-06 NOTE — Telephone Encounter (Signed)
Per Dr. Wyline Mood plan for outpatient lexiscan. Patient still admitted but can you please call him and get this arranged when he's DC please. Will schedule follow up in about 4 weeks so provider can review.

## 2023-02-06 NOTE — Progress Notes (Addendum)
PHARMACY - ANTICOAGULATION CONSULT NOTE  Pharmacy Consult for heparin Indication: chest pain/ACS  Allergies  Allergen Reactions   Ropinirole Hcl Other (See Comments)    Confusion, agiitation, disorientation, Hallucinations   Tramadol     lethargy    Patient Measurements:   Heparin Dosing Weight: 58.5 kg  Vital Signs: Temp: 98 F (36.7 C) (01/28 0115) Temp Source: Axillary (01/28 0115) BP: 119/79 (01/28 0115) Pulse Rate: 80 (01/28 0100)  Labs: Recent Labs    02/05/23 1237 02/05/23 1419 02/05/23 1716 02/05/23 2030 02/06/23 0103  HGB 12.0*  --   --   --   --   HCT 36.5*  --   --   --   --   PLT 352  --   --   --   --   HEPARINUNFRC  --   --   --   --  0.18*  CREATININE 3.18*  --   --   --   --   CKTOTAL  --  7,533*  --   --   --   TROPONINIHS 1,867* 2,607* 2,514* 3,931*  --     Estimated Creatinine Clearance: 16.1 mL/min (A) (by C-G formula based on SCr of 3.18 mg/dL (H)).   Medical History: Past Medical History:  Diagnosis Date   Arthritis    in his fingers   Bipolar disorder (HCC)    BPH (benign prostatic hyperplasia)    Cancer (HCC) 2020   LUNG CA   Depression    Emphysema    Episodic confusion    Possible bipolar disorder   GERD (gastroesophageal reflux disease)    History of chicken pox    History of hiatal hernia    HNP (herniated nucleus pulposus), lumbar    Hyperlipidemia    Lung abscess (HCC) 01/06/2019   Memory deficit 07/31/2012   OCD (obsessive compulsive disorder)    Peripheral neuropathy    Restless leg syndrome 01/10/2003   Unspecified psychosis 03/01/2012   Wears dentures    full upper and lower   Wears hearing aid in both ears     Assessment: [name] is a 78 y.o. year old male admitted on 02/05/2023 with ShOB and fall now concern for ACS. Troponin elevation 3931. No anticoagulation prior to admission. No PE per CTA ab/pelv. Pharmacy consulted to dose heparin.  Heparin level 0.18, subtherapeutic  No overt s/sx of  bleeding. No issues with infusion. Briefly paused after initial orders placed but restarted 1/27 1709.  Goal of Therapy: Heparin level 0.3-0.7 units/ml Monitor platelets by anticoagulation protocol: Yes   Plan:  Heparin 1000 units x 1 as bolus  Increase heparin infusion to 1050 units/hr 8h heparin level  Daily heparin level, CBC, and monitoring for bleeding F/u plans for anticoagulation and cards recs  Thank you for allowing pharmacy to participate in this patient's care.  Marja Kays, PharmD Emergency Medicine Clinical Pharmacist 02/06/2023,1:49 AM

## 2023-02-06 NOTE — Evaluation (Signed)
Physical Therapy Evaluation Patient Details Name: [name] MRN: [id number] DOB: 01-15-1945 Today's Date: 02/06/2023  History of Present Illness  [name] is a 78 y.o. male admitted 02/05/23 with weakness and fall at home.  Found to have NSTEMI, multifocal pneumonia, RSV, AKI, Rhabdomyolysis and metabolic acidosis.  PMH significant for bipolar disorder, BPH, depression, GERD, COPD, peripheral neuropathy, dyslipidemia, dementia, history of lung cancer status post surgery and lumbar fusion  Clinical Impression  Patient presents with decreased mobility due to generalized weakness, decreased activity tolerance, decreased balance and decreased safety awareness.  Apparently pt lives alone with sister next door helping some.  He states limited mobility as of late and recent falls.  Currently needing min to mod A for very short in room ambulation and moving bed to chair.  Patient will benefit from skilled PT in the acute setting.  Recommend inpatient rehab (<3 hours/day) prior to d/c home.       If plan is discharge home, recommend the following: A lot of help with walking and/or transfers;A lot of help with bathing/dressing/bathroom;Assistance with cooking/housework;Assist for transportation;Help with stairs or ramp for entrance   Can travel by private vehicle   No    Equipment Recommendations None recommended by PT  Recommendations for Other Services       Functional Status Assessment Patient has had a recent decline in their functional status and demonstrates the ability to make significant improvements in function in a reasonable and predictable amount of time.     Precautions / Restrictions Precautions Precautions: Fall      Mobility  Bed Mobility Overal bed mobility: Needs Assistance Bed Mobility: Rolling, Sidelying to Sit, Sit to Supine Rolling: Min assist Sidelying to sit: Mod assist   Sit to supine: Mod assist   General bed mobility comments: on stretcher in ED  assist to roll and lifting assist for trunk, to supine assist for positioning and legs into bed    Transfers Overall transfer level: Needs assistance Equipment used: Rolling walker (2 wheels) Transfers: Sit to/from Stand, Bed to chair/wheelchair/BSC Sit to Stand: Min assist   Step pivot transfers: Mod assist       General transfer comment: up from higher surface of stretcher with min A to RW pt already leaning forward, stepping to armchair in room with RW and A to keep walker close, assist for balance as pt leaning laterally and forward; to stand from armchair cues for UE placement on armrests and assist for anterior weight shift to stand    Ambulation/Gait Ambulation/Gait assistance: Min assist Gait Distance (Feet): 4 Feet Assistive device: Rolling walker (2 wheels) Gait Pattern/deviations: Step-to pattern, Trunk flexed, Narrow base of support, Decreased stride length, Shuffle       General Gait Details: flexed posture throughout and needing support for balance, walker safety and lines to step forward to enter stretcher closer to [facility].  Stairs            Wheelchair Mobility     Tilt Bed    Modified Rankin (Stroke Patients Only)       Balance Overall balance assessment: Needs assistance Sitting-balance support: Feet supported Sitting balance-Leahy Scale: Poor Sitting balance - Comments: some anterior leaning on edge of stretcher and CGA for safety   Standing balance support: Bilateral upper extremity supported Standing balance-Leahy Scale: Poor Standing balance comment: UE support and min A for balance  Pertinent Vitals/Pain Pain Assessment Pain Assessment: Faces Faces Pain Scale: Hurts little more Pain Location: generalized Pain Descriptors / Indicators: Discomfort, Aching Pain Intervention(s): Monitored during session, Repositioned    Home Living Family/patient expects to be discharged to:: Private  residence Living Arrangements: Alone Available Help at Discharge: Family;Available PRN/intermittently Type of Home: House Home Access: Stairs to enter   [employer] of Steps: 1   Home Layout: One level Home Equipment: Cane - single point;Standard Environmental consultant      Prior Function Prior Level of Function : Needs assist;History of Falls (last six months)             Mobility Comments: reports more difficulty getting around lately and sister who lives next door helping intermittently       Extremity/Trunk Assessment   Upper Extremity Assessment Upper Extremity Assessment: Generalized weakness    Lower Extremity Assessment Lower Extremity Assessment: Generalized weakness    Cervical / Trunk Assessment Cervical / Trunk Assessment: Kyphotic;Other exceptions Cervical / Trunk Exceptions: cachexia  Communication   Communication Communication: Hearing impairment;Difficulty following commands/understanding Following commands: Follows one step commands consistently;Follows one step commands with increased time  Cognition Arousal: Alert Behavior During Therapy: WFL for tasks assessed/performed Overall Cognitive Status: No family/caregiver present to determine baseline cognitive functioning                                 General Comments: oriented x 4 though some limited insight into deficits and living along        General Comments General comments (skin integrity, edema, etc.): VSS, O2 disconnected during transition to chair and SpO2 96%, replaced at rest on 2L as down to 92% at rest    Exercises     Assessment/Plan    PT Assessment Patient needs continued PT services  PT Problem List Decreased strength;Decreased balance;Decreased knowledge of use of DME;Decreased safety awareness;Decreased activity tolerance;Decreased mobility       PT Treatment Interventions DME instruction;Functional mobility training;Balance training;Patient/family  education;Gait training;Therapeutic exercise;Therapeutic activities    PT Goals (Current goals can be found in the Care Plan section)  Acute Rehab PT Goals Patient Stated Goal: get strength back PT Goal Formulation: With patient Time For Goal Achievement: 02/20/23 Potential to Achieve Goals: Fair    Frequency Min 1X/week     Co-evaluation               AM-PAC PT "6 Clicks" Mobility  Outcome Measure Help needed turning from your back to your side while in a flat bed without using bedrails?: A Little Help needed moving from lying on your back to sitting on the side of a flat bed without using bedrails?: A Lot Help needed moving to and from a bed to a chair (including a wheelchair)?: A Lot Help needed standing up from a chair using your arms (e.g., wheelchair or bedside chair)?: A Little Help needed to walk in hospital room?: Total Help needed climbing 3-5 steps with a railing? : Total 6 Click Score: 12    End of Session Equipment Utilized During Treatment: Gait belt;Oxygen Activity Tolerance: Patient limited by fatigue Patient left: in bed;with call bell/phone within reach   PT Visit Diagnosis: Muscle weakness (generalized) (M62.81);Other abnormalities of gait and mobility (R26.89);History of falling (Z91.81)    Time: 1610-9604 PT Time Calculation (min) (ACUTE ONLY): 21 min   Charges:   PT Evaluation $PT Eval Moderate Complexity: 1 Mod   PT General  Charges $$ ACUTE PT VISIT: 1 Visit         Sheran Lawless, PT Acute Rehabilitation Services Office:[phone] 02/06/2023   Elray Mcgregor 02/06/2023, 11:20 AM

## 2023-02-06 NOTE — TOC Initial Note (Addendum)
Transition of Care [facility]) - Initial/Assessment Note    Patient Details  Name: [name] MRN: [id number] Date of Birth: August 02, 1945  [facility]) CM/SW Contact:    Delilah Shan, LCSWA Phone Number: 02/06/2023, 5:03 PM  Clinical Narrative:                  CSW received consult for possible SNF placement at time of discharge. Due to patients current orientation CSW LVM for patients sister [name]. CSW awaiting call back  to discuss PT recommendation of SNF placement for patient at time of discharge. .CSW to continue to follow and assist with discharge planning needs.   Update- CSW received call back from patients sister [name]. CSW spoke with patients sister regarding PT recommendation of SNF placement for patient at time of discharge. Patients sister [name] reports PTA patient comes from home alone.Patients sister expressed understanding of PT recommendation and is agreeable to SNF placement for patient at time of discharge. Patients sister gave CSW permission to fax out initial referral for possible SNF placement.CSW discussed insurance authorization process. No further questions reported at this time. CSW to continue to follow and assist with discharge planning needs.        Patient Goals and CMS Choice            Expected Discharge Plan and Services                                              Prior Living Arrangements/Services                       Activities of Daily Living      Permission Sought/Granted                  Emotional Assessment              Admission diagnosis:  NSTEMI (non-ST elevated myocardial infarction) (HCC) [I21.4] AKI (acute kidney injury) (HCC) [N17.9] RSV infection [B33.8] Non-traumatic rhabdomyolysis [M62.82] Patient Active Problem List   Diagnosis Date Noted   NSTEMI (non-ST elevated myocardial infarction) (HCC) 02/05/2023   Multifocal pneumonia 02/05/2023   RSV (respiratory syncytial virus  pneumonia) 02/05/2023   AKI (acute kidney injury) (HCC) 02/05/2023   Rhabdomyolysis 02/05/2023   Metabolic acidosis 02/05/2023   Protein-calorie malnutrition, severe 11/29/2022   Dyslipidemia 11/27/2022   Pneumonia due to infectious organism 11/27/2022   GERD without esophagitis 11/27/2022   Chronic obstructive pulmonary disease (COPD) (HCC) 11/27/2022   Presbycusis of both ears 11/03/2022   Hyperuricemia 05/24/2021   S/P Nissen fundoplication (without gastrostomy tube) procedure 05/06/2019   Problems with swallowing and mastication    Stricture and stenosis of esophagus    History of lung cancer in adulthood 01/16/2019   Aortic atherosclerosis (HCC) 06/24/2018   S/P lumbar fusion 02/18/2018   Bipolar disorder (HCC) 08/06/2017   S/P lumbar laminectomy 06/22/2017   Coronary atherosclerosis 01/29/2017   Lung nodule seen on imaging study 01/29/2017   Maculopathy 12/28/2016   Patent foramen ovale    Abscess of upper lobe of left lung without pneumonia (HCC) 09/14/2015   Smoking greater than 30 pack years 07/19/2015   Subclinical hypothyroidism 07/17/2014   Anxiety 07/08/2014   Dyspnea on exertion 07/08/2014   Memory deficit 07/31/2012   Cerebrovascular disease, unspecified 03/01/2012   Restless legs syndrome (RLS) 03/01/2012  Hereditary and idiopathic peripheral neuropathy 03/01/2012   COPD (chronic obstructive pulmonary disease) (HCC) 06/14/2010   BPH (benign prostatic hyperplasia) 06/08/2009   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 [place] [address] Phone: [phone] Fax: [phone]     Social Drivers of Health (SDOH) Social  History: SDOH Screenings   Food Insecurity: No Food Insecurity (01/21/2023)  Housing: Unknown (01/21/2023)  Transportation Needs: No Transportation Needs (01/21/2023)  Utilities: Not At Risk (12/15/2022)  Alcohol Screen: Low Risk  (06/14/2022)  Depression (PHQ2-9): Low Risk  (01/19/2023)  Financial Resource Strain: Low Risk  (01/21/2023)  Physical Activity: Unknown (01/21/2023)  Social Connections: Moderately Isolated (01/21/2023)  Stress: No Stress Concern Present (01/21/2023)  Tobacco Use: Medium Risk (02/05/2023)   SDOH Interventions:     Readmission Risk Interventions     No data to display

## 2023-02-06 NOTE — Addendum Note (Signed)
Addended byYvonna Alanis on: 02/06/2023 10:26 AM   Modules accepted: Orders

## 2023-02-06 NOTE — ED Notes (Signed)
Per Pharm Heparin drip and NS bicarb drip compatible, drips combined, now infusing into right AC PIV

## 2023-02-06 NOTE — Telephone Encounter (Signed)
Noted  Per chart review PA [facility] placed order

## 2023-02-06 NOTE — Plan of Care (Signed)
His rhythm is stable.  I transitioned his heparin to eliquis for variable flutter/fib. Otherwise recommend continued treatment for PNA. We can arrange outpatient lexiscan and FU.  Otherwise cardiology will sign off.

## 2023-02-06 NOTE — Progress Notes (Signed)
PHARMACY - ANTICOAGULATION CONSULT NOTE  Pharmacy Consult for heparin>>apixban Indication: afib  Allergies  Allergen Reactions   Ropinirole Hcl Other (See Comments)    Confusion, agiitation, disorientation, Hallucinations   Tramadol     lethargy    Patient Measurements:   Heparin Dosing Weight: 58.5 kg  Vital Signs: Temp: 98.6 F (37 C) (01/28 0932) Temp Source: Axillary (01/28 0932) BP: 134/68 (01/28 0940) Pulse Rate: 77 (01/28 0940)  Labs: Recent Labs    02/05/23 1237 02/05/23 1419 02/05/23 1716 02/05/23 2030 02/06/23 0103 02/06/23 0450  HGB 12.0*  --   --   --   --  10.4*  HCT 36.5*  --   --   --   --  30.4*  PLT 352  --   --   --   --  206  HEPARINUNFRC  --   --   --   --  0.18*  --   CREATININE 3.18*  --   --   --   --  2.47*  CKTOTAL  --  7,533*  --   --   --  15,216*  TROPONINIHS 1,867* 2,607* 2,514* 3,931*  --  5,300*    Estimated Creatinine Clearance: 20.7 mL/min (A) (by C-G formula based on SCr of 2.47 mg/dL (H)).  Assessment: [name] is a 78 y.o. year old male admitted on 02/05/2023 with ShOB and fall now concern for ACS. Troponin elevation 3931. No anticoagulation prior to admission. No PE per CTA ab/pelv. Pharmacy consulted to dose heparin but now switching to apixaban for afib. Hgb is low at 10.4, ptls are WNL. Pt with AKI with Scr 2.47,   Goal of Therapy: Stroke prevention Monitor platelets by anticoagulation protocol: Yes   Plan:  Discontinue heparin gtt Start apixaban 2.5mg  PO BID F/u renal fxn, S&S of bleeding  [name], PharmD, BCPS, BCEMP Clinical Pharmacist Please see AMION for all pharmacy numbers 02/06/2023 11:11 AM

## 2023-02-06 NOTE — Progress Notes (Addendum)
PROGRESS NOTE  [name] ZOX:[id number] DOB: [birth year] DOA: 02/05/2023 PCP: Malva Limes, MD   LOS: 1 day   Brief narrative:   [name] is a 78 y.o. male with PMH of COPD, bipolar disorder, CVA, peripheral neuropathy, dementia, BPH, lung cancer s/p resection who was recently admitted hospital from 11/17 to 11/30/2022 due to pneumonia presented this time to the hospital with fall and weakness with difficulty ambulation and backache.  In the ED patient had stable vitals.  Lab showed creatinine elevation at 3.1 with AST elevation at  198. CK 7533, troponin was 02/14/2005.  Procalcitonin elevated at 6.2.  WBC elevated at 34 K. EKG atrial flutter HR 64 controlled.  CT scan of the head and CT C-spine negative.  CT of the chest was negative for PE, no aortic dissection.  Multifocal pneumonia and left lower lobe opacity possible reactivation of lung cancer.  Recommend PET scan as an outpatient. Patient was then admitted to the hospital for further evaluation and treatment.  Assessment/Plan: Principal Problem:   NSTEMI (non-ST elevated myocardial infarction) (HCC) Active Problems:   Multifocal pneumonia   RSV (respiratory syncytial virus pneumonia)   AKI (acute kidney injury) (HCC)   Rhabdomyolysis   Metabolic acidosis  Troponins likely demand ischemia EKG did not show any ischemic changes.  With significant troponin elevation but trending down.  On aspirin, was on heparin drip.  Cardiology consulted and recommend outpatient stress test.  2D echocardiogram from November 2024 with LV ejection fraction of 60% with normal RV function.  Has been started on Eliquis  Atrial flutter, paroxysmal, currently in sinus rhythm   Sepsis due to Multifocal pneumonia, Was tachypneic with leukocytosis and imaging showed multifocal pneumonia.  Procalcitonin was significantly elevated at 6.2.  Respiratory viral panel was positive for respiratory syncytial virus.  Has been empirically started on  Rocephin and doxycycline.  Blood cultures pending at this time.  Continue supportive care.   RSV positive Symptomatic care.   AKI with metabolic acidosis most likely due to rhabdomyolysis Started on IV bicarb infusion.  Latest bicarb of 16.  Continue to monitor urine output.  Creatinine today at 2.4 from 3.1.  Trending down.  Will continue to monitor creatinine levels.  Baseline creatinine between 0.8-1.  Strict intake and output charting, daily weights.  Patient is positive balance for 1156 mL.  Consult nephrology, if not improving.  Rhabdomyolysis CK level 7533 on presentation which has trended up to 15,216 today.  Continue with IV fluids. Trend CK level, continue to hold statins for now.   History of bipolar disorder, dementia and anxiety Continue Geodon 40 mg p.o. nightly and Depakote 1000 mg at bedtime   COPD, resumed Incruse Ellipta, home inhaler continue DuoNebs as needed   History of lung cancer, history of resection in 2020. CTA chest shows possible recurrence of lung cancer Recommend to follow with oncology as an outpatient to repeat PET scan for further management.   BPH, not on any medication.  Check bladder scan if needed.   GERD continue PPI   Deep tissue injury Left heel.  Present on admission continue pressure ulcer prevention protocol.  Wound care on board.  Debility, deconditioning, Having difficulty with ambulation at home. Will check PT.  DVT prophylaxis: apixaban (ELIQUIS) tablet 2.5 mg Start: 02/06/23 1115 apixaban (ELIQUIS) tablet 2.5 mg   Disposition:  Uncertain at this time.  Patient from home, lives alone, sister checks on him.  Will get PT, OT evaluation. Might need rehabilitation.  Was  getting physical therapy at home.  Status is: Inpatient  Remains inpatient appropriate because: multifocal pneumonia, IV antibiotic, pending clinical improvement    Code Status:     Code Status: Do not attempt resuscitation (DNR) PRE-ARREST INTERVENTIONS  DESIRED  Family Communication: I spoke with the patient's sister Ms Red Christians on the phone and updated her about the clinical condition of the patient.   Consultants: Cardiology  Procedures: None  Anti-infectives:  Rocephin and doxycycline IV  Anti-infectives (From admission, onward)    Start     Dose/Rate Route Frequency Ordered Stop   02/05/23 2030  doxycycline (VIBRAMYCIN) 100 mg in sodium chloride 0.9 % 250 mL IVPB        100 mg 125 mL/hr over 120 Minutes Intravenous Every 12 hours 02/05/23 2029     02/05/23 1945  cefTRIAXone (ROCEPHIN) 1 g in sodium chloride 0.9 % 100 mL IVPB        1 g 200 mL/hr over 30 Minutes Intravenous Every 24 hours 02/05/23 1941     02/05/23 1945  azithromycin (ZITHROMAX) 500 mg in sodium chloride 0.9 % 250 mL IVPB  Status:  Discontinued        500 mg 250 mL/hr over 60 Minutes Intravenous Every 24 hours 02/05/23 1941 02/05/23 2028       Subjective: Today, patient was seen and examined at bedside.  Patient states that he feels okay.  Denies any nausea vomiting.  Denies any shortness of breath or chest pain.  Objective: Vitals:   02/06/23 0932 02/06/23 0940  BP:  134/68  Pulse:  77  Resp:  18  Temp: 98.6 F (37 C)   SpO2:  96%    Intake/Output Summary (Last 24 hours) at 02/06/2023 1145 Last data filed at 02/06/2023 0340 Gross per 24 hour  Intake 1656.67 ml  Output 500 ml  Net 1156.67 ml   There were no vitals filed for this visit. There is no height or weight on file to calculate BMI.   Physical Exam:  GENERAL: Patient is alert awake and Communicative, oriented to month and place not in obvious distress. HENT: No scleral pallor or icterus. Pupils equally reactive to light. Oral mucosa is moist NECK: is supple, no gross swelling noted. CHEST: Diminished breath sounds, No crackles or wheezes.   CVS: S1 and S2 heard, no murmur. Regular rate and rhythm.  ABDOMEN: Soft, non-tender, bowel sounds are present. EXTREMITIES: No  edema. CNS: Cranial nerves are intact. Generalized weakness noted. SKIN: warm and dry without rashes.Deep tissue injury left hell.  Data Review: I have personally reviewed the following laboratory data and studies,  CBC: Recent Labs  Lab 02/05/23 1237 02/06/23 0450  WBC 34.5* 26.5*  NEUTROABS 30.7*  --   HGB 12.0* 10.4*  HCT 36.5* 30.4*  MCV 104.0* 102.4*  PLT 352 206   Basic Metabolic Panel: Recent Labs  Lab 02/05/23 1237 02/06/23 0450  NA 145 143  K 4.1 4.2  CL 108 105  CO2 16* 22  GLUCOSE 106* 88  BUN 26* 47*  CREATININE 3.18* 2.47*  CALCIUM 9.9 8.1*  MG 2.6* 2.3  PHOS  --  5.3*   Liver Function Tests: Recent Labs  Lab 02/05/23 1237 02/06/23 0450  AST 198* 544*  ALT 37 85*  ALKPHOS 80 64  BILITOT 0.8 0.5  PROT 7.3 6.0*  ALBUMIN 2.8* 2.3*   No results for input(s): "LIPASE", "AMYLASE" in the last 168 hours. No results for input(s): "AMMONIA" in the last 168 hours. Cardiac  Enzymes: Recent Labs  Lab 02/05/23 1419 02/06/23 0450  CKTOTAL 7,533* 15,216*   BNP (last 3 results) Recent Labs    11/03/22 1115 11/26/22 1520  BNP 87.4 77.0    ProBNP (last 3 results) No results for input(s): "PROBNP" in the last 8760 hours.  CBG: No results for input(s): "GLUCAP" in the last 168 hours. Recent Results (from the past 240 hours)  Resp panel by RT-PCR (RSV, Flu A&B, Covid) Anterior Nasal Swab     Status: Abnormal   Collection Time: 02/05/23  1:01 PM   Specimen: Anterior Nasal Swab  Result Value Ref Range Status   SARS Coronavirus 2 by RT PCR NEGATIVE NEGATIVE Final   Influenza A by PCR NEGATIVE NEGATIVE Final   Influenza B by PCR NEGATIVE NEGATIVE Final    Comment: (NOTE) The Xpert Xpress SARS-CoV-2/FLU/RSV plus assay is intended as an aid in the diagnosis of influenza from Nasopharyngeal swab specimens and should not be used as a sole basis for treatment. Nasal washings and aspirates are unacceptable for Xpert Xpress  SARS-CoV-2/FLU/RSV testing.  Fact Sheet for Patients: BloggerCourse.com  Fact Sheet for Healthcare Providers: SeriousBroker.it  This test is not yet approved or cleared by the [place] FDA and has been authorized for detection and/or diagnosis of SARS-CoV-2 by FDA under an Emergency Use Authorization (EUA). This EUA will remain in effect (meaning this test can be used) for the duration of the COVID-19 declaration under Section 564(b)(1) of the Act, 21 U.S.C. section 360bbb-3(b)(1), unless the authorization is terminated or revoked.     Resp Syncytial Virus by PCR POSITIVE (A) NEGATIVE Final    Comment: (NOTE) Fact Sheet for Patients: BloggerCourse.com  Fact Sheet for Healthcare Providers: SeriousBroker.it  This test is not yet approved or cleared by the [place] FDA and has been authorized for detection and/or diagnosis of SARS-CoV-2 by FDA under an Emergency Use Authorization (EUA). This EUA will remain in effect (meaning this test can be used) for the duration of the COVID-19 declaration under Section 564(b)(1) of the Act, 21 U.S.C. section 360bbb-3(b)(1), unless the authorization is terminated or revoked.  Performed at [facility], [address] [place], [place] [address]   Culture, blood (Routine X 2) w Reflex to ID Panel     Status: None (Preliminary result)   Collection Time: 02/05/23  8:30 PM   Specimen: BLOOD  Result Value Ref Range Status   Specimen Description BLOOD SITE NOT SPECIFIED  Final   Special Requests   Final    BOTTLES DRAWN AEROBIC AND ANAEROBIC Blood Culture results may not be optimal due to an inadequate volume of blood received in culture bottles   Culture   Final    NO GROWTH < 12 HOURS Performed at [facility], [address], [place], [place] [address]    Report Status PENDING  Incomplete  Culture, blood (Routine X 2) w  Reflex to ID Panel     Status: None (Preliminary result)   Collection Time: 02/05/23  8:32 PM   Specimen: BLOOD  Result Value Ref Range Status   Specimen Description BLOOD SITE NOT SPECIFIED  Final   Special Requests   Final    BOTTLES DRAWN AEROBIC AND ANAEROBIC Blood Culture results may not be optimal due to an inadequate volume of blood received in culture bottles   Culture   Final    NO GROWTH < 12 HOURS Performed at [facility], [address] [place], [place] [address]  Report Status PENDING  Incomplete     Studies: CT ANGIO LOWER EXT BILAT W &/OR WO CONTRAST Result Date: 02/05/2023 CLINICAL DATA:  Claudication or leg ischemia Assymmetric pulses, hx of claudication in the left leg EXAM: CT ANGIOGRAPHY OF BILATERAL LOWER EXTREMITY TECHNIQUE: Multidetector CT imaging of the lower extremities was performed using the standard protocol during bolus administration of intravenous contrast. Multiplanar CT image reconstructions and MIPs were obtained to evaluate the vascular anatomy. RADIATION DOSE REDUCTION: This exam was performed according to the departmental dose-optimization program which includes automated exposure control, adjustment of the mA and/or kV according to patient size and/or use of iterative reconstruction technique. CONTRAST:  OMNIPAQUE IOHEXOL 350 MG/ML SOLN COMPARISON:  None Available. FINDINGS: RIGHT Lower Extremity Inflow: Common, internal and external iliac arteries are patent without evidence of aneurysm, dissection, or vasculitis. The common and external iliac arteries are moderately calcified. Outflow: 1 calcified plaque causes approximately high-grade the profundus femoral artery is patent. Proximal superficial femoral artery is narrowed and small in caliber. Moderate plaque throughout the superficial femoral artery with areas of intermittent narrowing. Moderate narrowing of the popliteal due to calcified plaque. Stenosis in the proximal common femoral artery.  Runoff: The calf vessels are difficult to accurately assess due to calcifications and small caliber. There is 2 caliber runoff to the ankle. LEFT Lower Extremity Inflow: Common, internal and external iliac arteries are patent without evidence of aneurysm, dissection, or vasculitis. Moderate plaque involving the common and proximal external iliac artery. Outflow: Bulky plaque at the common femoral artery crosses high-grade stenosis. The profundus femoral artery is patent. The superficial femoral artery is densely calcified and diminutive. Superficial femoral artery is occluded over the course of 6 cm, series 3, image 119-149. There is 30 distal reconstitution within a diminutive superficial femoral artery distally. The popliteal artery is severely diseased and small in caliber. Runoff: Calf vessels are difficult to accurately assess due to calcifications and small caliber there is 2 vessel runoff to the ankle. Veins: No obvious venous abnormality within the limitations of this arterial phase study. Review of the MIP images confirms the above findings. NON-VASCULAR No acute intramuscular findings. Acute osseous findings. Mild degenerative change of both knees. IMPRESSION: 1. Occlusion of the left superficial femoral artery over the course of 6 cm. There is distal reconstitution within a diminutive superficial femoral artery distally. The popliteal artery is severely diseased and small in caliber. 2. High-grade stenosis of the left common femoral artery. 3. High-grade stenosis of the proximal right common femoral artery. 4. Moderate plaque throughout the right superficial femoral artery with areas of intermittent narrowing. Moderate narrowing of the right popliteal artery. 5. Calf vessels are difficult to accurately assess due to calcifications and small caliber. There is 2 vessel runoff to the ankle bilaterally. These results were called by telephone at the time of interpretation on 02/05/2023 at 5:27 pm to provider  RAY, who verbally acknowledged these results. Electronically Signed   By: Narda Rutherford M.D.   On: 02/05/2023 17:27   CT Angio Chest/Abd/Pel for Dissection W and/or Wo Contrast Result Date: 02/05/2023 CLINICAL DATA:  Acute aortic syndrome suspected. Shortness of breath. EXAM: CT ANGIOGRAPHY CHEST, ABDOMEN AND PELVIS TECHNIQUE: Non-contrast CT of the chest was initially obtained. Multidetector CT imaging through the chest, abdomen and pelvis was performed using the standard protocol during bolus administration of intravenous contrast. Multiplanar reconstructed images and MIPs were obtained and reviewed to evaluate the vascular anatomy. RADIATION DOSE REDUCTION: This exam was performed according to  the departmental dose-optimization program which includes automated exposure control, adjustment of the mA and/or kV according to patient size and/or use of iterative reconstruction technique. CONTRAST:  OMNIPAQUE IOHEXOL 350 MG/ML SOLN COMPARISON:  Chest radiograph earlier today.  Chest CT 11/26/2022 FINDINGS: CTA CHEST FINDINGS Cardiovascular: No aortic hematoma on noncontrast exam. There is no dissection, aneurysm or acute aortic finding. Moderate aortic atherosclerosis and tortuosity. There is no central pulmonary embolus to the segmental level. The heart is normal in size. There are coronary artery calcifications. No pericardial effusion. Mediastinum/Nodes: 11 mm subcarinal node. No enlarged hilar lymph nodes. Axillary adenopathy. There is moderate diffuse esophageal wall thickening. Lungs/Pleura: Nodular area in the medial left lower lobe abutting the inferior hilum measuring 2.8 x 2 cm series 9, image 51. Mild soft tissue images there is central low-density series 7, image 57. This is increasing from prior exam. Stable irregular density in the left upper lobe adjacent to fiducial marker measuring 16 x 8 mm series 9, image 35. Posterior right apical scarring. Minimal patchy ground-glass in the anterior  left upper lobe series 9, image 51 and right upper lobe series 9, image 40. This is new from prior exam. Increasing ground-glass and nodular opacities in the dependent right lower lobe. Moderate emphysema. Lower lobe bronchial thickening with areas of mucoid impaction involving the segmental left lower lobe and subsegmental right lower lobe. No significant pleural effusion. Musculoskeletal: No suspicious bone lesion or acute osseous findings. Remote right rib fractures. Thoracic spondylosis. Review of the MIP images confirms the above findings. CTA ABDOMEN AND PELVIS FINDINGS VASCULAR Aorta: Moderate atherosclerosis. Normal caliber aorta without aneurysm, dissection, vasculitis or significant stenosis. Celiac: Mild plaque at the origin with minor stenosis. The distal branches are patent. SMA: Noncalcified plaque in the proximal SMA spanning 15 mm causes approximately 50% luminal narrowing. The distal branches are patent. Replaced right hepatic artery arises from the SMA. . Renals: Single bilateral renal arteries. There is moderate plaque at the origin of the renal arteries. Mild stenosis on the right. The distal branches are patent. No dissection. IMA: Patent without evidence of aneurysm, dissection, vasculitis or significant stenosis. Inflow: Moderately calcified. Patent without evidence of aneurysm, dissection, vasculitis or significant stenosis. Lower extremity CT performed separately. Veins: No obvious venous abnormality within the limitations of this arterial phase study. Review of the MIP images confirms the above findings. NON-VASCULAR Hepatobiliary: No focal liver abnormality on this arterial phase exam. Unremarkable gallbladder. Pancreas: No ductal dilatation or inflammation. Spleen: Normal in size and arterial enhancement. Adrenals/Urinary Tract: No adrenal nodule. No hydronephrosis or suspicious renal abnormality on arterial phase imaging. Unremarkable urinary bladder. Stomach/Bowel: Wall thickening at  the gastroesophageal junction. No bowel obstruction or inflammation. Normal appendix. Small to moderate colonic stool burden. There is moderate stool distending the rectum. Lymphatic: No lymphadenopathy. Reproductive: Large prostate spans 5.1 cm transverse. Other: No ascites or free air.  No abdominal wall hernia. Musculoskeletal: Postsurgical and degenerative change in the spine. No acute osseous findings or focal bone lesion. Review of the MIP images confirms the above findings. IMPRESSION: 1. No aortic dissection or aneurysm.  No central pulmonary embolus. 2. Increasing nodular area in the medial left lower lobe abutting the inferior hilum measuring 2.8 x 2 cm. There is central low-density which may represent necrosis. This is suspicious for recurrent malignancy. Oncologic follow-up is recommended, consider PET. 3. Nonspecific 11 mm subcarinal lymph node. 4. Stable irregular density in the left upper lobe adjacent to fiducial marker measuring 16 x 8 mm.  5. Increasing ground-glass and nodular opacities in the dependent right lower lobe, new ground-glass in the anterior left upper lobe and right upper lobe. This is suspicious for multifocal pneumonia. 6. Lower lobe bronchial thickening with areas of mucoid impaction involving the segmental left lower lobe and subsegmental right lower lobe. 7. Moderate diffuse esophageal wall thickening, similar or minimally progressed from prior exam. Wall thickening at the gastroesophageal junction. Aortic Atherosclerosis (ICD10-I70.0) and Emphysema (ICD10-J43.9). Electronically Signed   By: Narda Rutherford M.D.   On: 02/05/2023 17:17   CT Head Wo Contrast Result Date: 02/05/2023 CLINICAL DATA:  Mental status change EXAM: CT HEAD WITHOUT CONTRAST TECHNIQUE: Contiguous axial images were obtained from the base of the skull through the vertex without intravenous contrast. RADIATION DOSE REDUCTION: This exam was performed according to the departmental dose-optimization program  which includes automated exposure control, adjustment of the mA and/or kV according to patient size and/or use of iterative reconstruction technique. COMPARISON:  CT head 11/26/2022 FINDINGS: Brain: No intracranial hemorrhage, mass effect, or evidence of acute infarct. No hydrocephalus. No extra-axial fluid collection. Age-commensurate cerebral atrophy and chronic small vessel ischemic disease. Vascular: No hyperdense vessel. Intracranial arterial calcification. Skull: No fracture or focal lesion. Sinuses/Orbits: No acute finding. Other: None. IMPRESSION: No acute intracranial abnormality. Electronically Signed   By: Minerva Fester M.D.   On: 02/05/2023 15:42   DG Hand Complete Left Result Date: 02/05/2023 CLINICAL DATA:  Pain after fall EXAM: LEFT HAND - COMPLETE 3 VIEW COMPARISON:  None Available. FINDINGS: Osteopenia. No fracture or dislocation. Joint space loss and osteophytes seen of the interphalangeal joint of the thumb. Is also moderate joint space loss of the first carpometacarpal joint. Mild elsewhere. IMPRESSION: Osteopenia and degenerative changes. Electronically Signed   By: Karen Kays M.D.   On: 02/05/2023 14:43   DG Knee Complete 4 Views Left Result Date: 02/05/2023 CLINICAL DATA:  Fall and trauma to the left knee. EXAM: LEFT KNEE - COMPLETE 4+ VIEW COMPARISON:  None Available. FINDINGS: There is no acute fracture or dislocation. The bones are osteopenic. Mild arthritic changes of the knee. No joint effusion. The soft tissues are unremarkable. IMPRESSION: 1. No acute fracture or dislocation. 2. Mild arthritic changes. Electronically Signed   By: Elgie Collard M.D.   On: 02/05/2023 14:42   DG Chest Port 1 View Result Date: 02/05/2023 CLINICAL DATA:  Shortness of breath EXAM: PORTABLE CHEST 1 VIEW COMPARISON:  01/19/2023 FINDINGS: Hyperinflation. No consolidation, pneumothorax or effusion. No edema. Normal cardiopericardial silhouette. Calcified aorta. Overlapping cardiac leads. Surgical  clips in the left hilum. Degenerative changes. IMPRESSION: Hyperinflation with chronic changes.  Surgical clips. Electronically Signed   By: Karen Kays M.D.   On: 02/05/2023 14:13   CT Cervical Spine Wo Contrast Result Date: 02/05/2023 CLINICAL DATA:  Neck trauma (Age >= 65y).  Mental status changes. EXAM: CT CERVICAL SPINE WITHOUT CONTRAST TECHNIQUE: Multidetector CT imaging of the cervical spine was performed without intravenous contrast. Multiplanar CT image reconstructions were also generated. RADIATION DOSE REDUCTION: This exam was performed according to the departmental dose-optimization program which includes automated exposure control, adjustment of the mA and/or kV according to patient size and/or use of iterative reconstruction technique. COMPARISON:  None Available. FINDINGS: Alignment: Normal Skull base and vertebrae: No acute fracture. No primary bone lesion or focal pathologic process. Soft tissues and spinal canal: No prevertebral fluid or swelling. No visible canal hematoma. Disc levels: Degenerative disc disease changes in the mid to lower cervical spine with disc space narrowing  and spurring. Moderate bilateral degenerative facet disease. Upper chest: Emphysema, biapical scarring. Other: None IMPRESSION: Degenerative disc and facet disease.  No acute bony abnormality. Electronically Signed   By: Charlett Nose M.D.   On: 02/05/2023 13:30      Joycelyn Das, MD  [facility] 02/06/2023  If 7PM-7AM, please contact night-coverage

## 2023-02-07 ENCOUNTER — Inpatient Hospital Stay: Admission: RE | Admit: 2023-02-07 | Payer: PPO | Source: Ambulatory Visit

## 2023-02-07 DIAGNOSIS — E872 Acidosis, unspecified: Secondary | ICD-10-CM | POA: Diagnosis not present

## 2023-02-07 DIAGNOSIS — J121 Respiratory syncytial virus pneumonia: Secondary | ICD-10-CM

## 2023-02-07 DIAGNOSIS — J189 Pneumonia, unspecified organism: Secondary | ICD-10-CM | POA: Diagnosis not present

## 2023-02-07 DIAGNOSIS — M6282 Rhabdomyolysis: Secondary | ICD-10-CM

## 2023-02-07 DIAGNOSIS — N179 Acute kidney failure, unspecified: Secondary | ICD-10-CM | POA: Diagnosis not present

## 2023-02-07 DIAGNOSIS — I214 Non-ST elevation (NSTEMI) myocardial infarction: Secondary | ICD-10-CM | POA: Diagnosis not present

## 2023-02-07 LAB — MAGNESIUM: Magnesium: 2.1 mg/dL (ref 1.7–2.4)

## 2023-02-07 LAB — COMPREHENSIVE METABOLIC PANEL
ALT: 109 U/L — ABNORMAL HIGH (ref 0–44)
AST: 487 U/L — ABNORMAL HIGH (ref 15–41)
Albumin: 2.2 g/dL — ABNORMAL LOW (ref 3.5–5.0)
Alkaline Phosphatase: 65 U/L (ref 38–126)
Anion gap: 10 (ref 5–15)
BUN: 35 mg/dL — ABNORMAL HIGH (ref 8–23)
CO2: 25 mmol/L (ref 22–32)
Calcium: 8.2 mg/dL — ABNORMAL LOW (ref 8.9–10.3)
Chloride: 102 mmol/L (ref 98–111)
Creatinine, Ser: 1.5 mg/dL — ABNORMAL HIGH (ref 0.61–1.24)
GFR, Estimated: 48 mL/min — ABNORMAL LOW (ref >60)
Glucose, Bld: 86 mg/dL (ref 70–99)
Potassium: 3.8 mmol/L (ref 3.5–5.1)
Sodium: 137 mmol/L (ref 135–145)
Total Bilirubin: 0.4 mg/dL (ref 0.0–1.2)
Total Protein: 5.7 g/dL — ABNORMAL LOW (ref 6.5–8.1)

## 2023-02-07 LAB — AMMONIA: Ammonia: 23 umol/L (ref 9–35)

## 2023-02-07 LAB — CBC
HCT: 29.8 % — ABNORMAL LOW (ref 39.0–52.0)
Hemoglobin: 10.3 g/dL — ABNORMAL LOW (ref 13.0–17.0)
MCH: 34.6 pg — ABNORMAL HIGH (ref 26.0–34.0)
MCHC: 34.6 g/dL (ref 30.0–36.0)
MCV: 100 fL (ref 80.0–100.0)
Platelets: 202 10*3/uL (ref 150–400)
RBC: 2.98 MIL/uL — ABNORMAL LOW (ref 4.22–5.81)
RDW: 14.7 % (ref 11.5–15.5)
WBC: 23.6 10*3/uL — ABNORMAL HIGH (ref 4.0–10.5)
nRBC: 0 % (ref 0.0–0.2)

## 2023-02-07 LAB — CK: Total CK: 7258 U/L — ABNORMAL HIGH (ref 49–397)

## 2023-02-07 LAB — PHOSPHORUS: Phosphorus: 3 mg/dL (ref 2.5–4.6)

## 2023-02-07 LAB — VALPROIC ACID LEVEL: Valproic Acid Lvl: 75 ug/mL (ref 50.0–100.0)

## 2023-02-07 NOTE — Progress Notes (Addendum)
   Re: [name] DOB: [birth year] Date: 02/07/2023  To Whom it may concern:  Please be advised that the above-named patient has a primary diagnosis of dementia which supersedes any psychiatric diagnosis.

## 2023-02-07 NOTE — Evaluation (Signed)
Occupational Therapy Evaluation Patient Details Name: MARQUAVIOUS NAZAR MRN: [id number] DOB: 1945-12-28 Today's Date: 02/07/2023   History of Present Illness [name] is a 78 y.o. male admitted 02/05/23 with weakness and fall at home.  Found to have NSTEMI, multifocal pneumonia, RSV, AKI, Rhabdomyolysis and metabolic acidosis.  PMH significant for bipolar disorder, BPH, depression, GERD, COPD, peripheral neuropathy, dyslipidemia, dementia, history of lung cancer status post surgery and lumbar fusion   Clinical Impression   Pt demonstrating some impaired cognition with ability to follow commands, sequence, and process. He also displays limited initiation with bed mobility and ADLs. Pt able to stand with min A and does great with +2 HHA as the RW seemed to a bit too much for him to take steps. Pt able to ambulated to sink with small shuffle steps to perform seated grooming but has poor safety awareness and makes efforts to sit when not safe. Pt would benefit from continued acute skilled OT services to address listed deficits and help transition to next level of care. Patient would benefit from post acute skilled rehab facility with <3 hours of therapy and 24/7 support       If plan is discharge home, recommend the following: Two people to help with walking and/or transfers;A lot of help with bathing/dressing/bathroom;Assistance with cooking/housework;Direct supervision/assist for medications management;Supervision due to cognitive status;Direct supervision/assist for financial management;Help with stairs or ramp for entrance;Assist for transportation    Functional Status Assessment  Patient has had a recent decline in their functional status and demonstrates the ability to make significant improvements in function in a reasonable and predictable amount of time.  Equipment Recommendations  None recommended by OT (TBD at next level of care)    Recommendations for Other Services        Precautions / Restrictions Precautions Precautions: Fall Restrictions Weight Bearing Restrictions Per Provider Order: No      Mobility Bed Mobility Overal bed mobility: Needs Assistance Bed Mobility: Supine to Sit, Sit to Supine Rolling: Mod assist Sidelying to sit: Mod assist   Sit to supine: Mod assist   General bed mobility comments: Pt with poor initaition of bed mobility    Transfers Overall transfer level: Needs assistance Equipment used: Rolling walker (2 wheels), 2 person hand held assist Transfers: Sit to/from Stand, Bed to chair/wheelchair/BSC Sit to Stand: Min assist     Step pivot transfers: Mod assist     General transfer comment: Min A to rise, pt needing Mod A during transfers to help guide hips and reduce early sit, cues provided for pt to reposition body for safety      Balance Overall balance assessment: Needs assistance Sitting-balance support: Feet supported Sitting balance-Leahy Scale: Fair     Standing balance support: Bilateral upper extremity supported, During functional activity Standing balance-Leahy Scale: Poor Standing balance comment: UE support                           ADL either performed or assessed with clinical judgement   ADL Overall ADL's : Needs assistance/impaired Eating/Feeding: Bed level;Set up   Grooming: Sitting;Oral care;Wash/dry face;Maximal assistance;Cueing for sequencing Grooming Details (indicate cue type and reason): Pt performed oral care with setup and cues to sequence task and recall him to spit out rinse. But was max A to wash face due to lack of initiation Upper Body Bathing: Sitting;Minimal assistance   Lower Body Bathing: Sitting/lateral leans;Moderate assistance   Upper Body Dressing : Sitting;Minimal  assistance   Lower Body Dressing: Sitting/lateral leans;Total assistance Lower Body Dressing Details (indicate cue type and reason): donn socks Toilet Transfer: Minimal assistance;Moderate  assistance;+2 for physical assistance;Stand-pivot;BSC/3in1 Toilet Transfer Details (indicate cue type and reason): Min to Mod A +2, intermittent downward force through RUE when ambulating Toileting- Clothing Manipulation and Hygiene: Moderate assistance;+2 for safety/equipment       Functional mobility during ADLs: Minimal assistance;Moderate assistance;+2 for safety/equipment;+2 for physical assistance (HHA)       Vision         Perception         Praxis         Pertinent Vitals/Pain Pain Assessment Pain Assessment: Faces Faces Pain Scale: Hurts little more Pain Location: LLE-blister on bottom Pain Descriptors / Indicators: Discomfort, Aching, Sore Pain Intervention(s): Monitored during session, Repositioned, Limited activity within patient's tolerance     Extremity/Trunk Assessment Upper Extremity Assessment Upper Extremity Assessment: Generalized weakness   Lower Extremity Assessment Lower Extremity Assessment: Generalized weakness   Cervical / Trunk Assessment Cervical / Trunk Assessment: Kyphotic;Other exceptions Cervical / Trunk Exceptions: cachexia   Communication Communication Communication: Hearing impairment;Difficulty following commands/understanding Following commands: Follows one step commands with increased time Cueing Techniques: Verbal cues;Gestural cues   Cognition Arousal: Alert Behavior During Therapy: WFL for tasks assessed/performed Overall Cognitive Status: No family/caregiver present to determine baseline cognitive functioning                                 General Comments: A &Ox4 but decreased safety awareness, impaired commands following, slower processing and lack of initiation     General Comments  VSS on RA    Exercises     Shoulder Instructions      Home Living Family/patient expects to be discharged to:: Private residence Living Arrangements: Alone Available Help at Discharge: Family;Available  PRN/intermittently Type of Home: House Home Access: Stairs to enter [employer] of Steps: 1   Home Layout: One level     Bathroom Shower/Tub: Producer, television/film/video: Standard     Home Equipment: Cane - single point;Standard Environmental consultant          Prior Functioning/Environment Prior Level of Function : Needs assist;History of Falls (last six months)             Mobility Comments: reports more difficulty getting around lately and sister who lives next door helping intermittently ADLs Comments: Reports he is generally independent for ADL management. Intermitently using his shower's soap dispenser as grab bar for stability in walk-in shower. Sister lives next door and can assist with IADL management when pt is not feeling well. Obtained from previous notes, pt not providing this info        OT Problem List: Decreased strength;Impaired balance (sitting and/or standing);Decreased cognition;Pain;Decreased activity tolerance;Decreased safety awareness      OT Treatment/Interventions: Self-care/ADL training;DME and/or AE instruction;Therapeutic activities;Balance training;Therapeutic exercise;Patient/family education    OT Goals(Current goals can be found in the care plan section) Acute Rehab OT Goals Patient Stated Goal: none stated Time For Goal Achievement: 02/21/23 Potential to Achieve Goals: Good ADL Goals Pt Will Perform Grooming: with contact guard assist;standing Pt Will Perform Lower Body Dressing: sit to/from stand;with contact guard assist Pt Will Transfer to Toilet: with contact guard assist;ambulating Pt/caregiver will Perform Home Exercise Program: Increased strength;Both right and left upper extremity;With theraband;With Supervision;With written HEP provided  OT Frequency: Min 1X/week    Co-evaluation  AM-PAC OT "6 Clicks" Daily Activity     Outcome Measure Help from another person eating meals?: A Little Help from another  person taking care of personal grooming?: A Lot Help from another person toileting, which includes using toliet, bedpan, or urinal?: A Lot Help from another person bathing (including washing, rinsing, drying)?: A Lot Help from another person to put on and taking off regular upper body clothing?: A Little Help from another person to put on and taking off regular lower body clothing?: A Lot 6 Click Score: 14   End of Session Equipment Utilized During Treatment: Gait belt;Rolling walker (2 wheels) Nurse Communication: Mobility status (+2 for safety)  Activity Tolerance: Patient tolerated treatment well Patient left: in bed;with call bell/phone within reach;with bed alarm set;with family/visitor present  OT Visit Diagnosis: Unsteadiness on feet (R26.81);Muscle weakness (generalized) (M62.81);History of falling (Z91.81)                Time: 1610-9604 OT Time Calculation (min): 30 min Charges:  OT General Charges $OT Visit: 1 Visit OT Evaluation $OT Eval Moderate Complexity: 1 Mod OT Treatments $Self Care/Home Management : 8-22 mins  02/07/2023  AB, OTR/L  Acute Rehabilitation Services  Office: [phone]   Tristan Schroeder 02/07/2023, 5:40 PM

## 2023-02-07 NOTE — Consult Note (Signed)
Value-Based Care [facility] Liaison Consult Note   02/07/2023  [name] [birth year] [id number]  Insurance: [employer]   Primary Care Provider: Malva Limes, MD with [facility]  this provider is listed for the transition of care follow up appointments  and VBCI [facility] calls   [facility] Liaison rounding note, patient on Droplet precautions, with RSV, PPE reserved for hospital staff. Patient is not fully oriented per staff.  The patient was screened for  unplanned readmission hospitalization with noted high risk score for unplanned readmission risk 2 hospital admissions in 6 months.  The patient was assessed for potential Community Care Coordination service needs for post hospital transition for care coordination. Review of patient's electronic medical record reveals patient had previously been followed by VBCI TOC RN in December, 2024. Admitted with NSTEMI  Plan: [facility] Liaison will continue to follow progress and disposition to asess for post hospital community care coordination/management needs.  Referral request for community care coordination: currently SNF is recommended and would need insurance auth.  Lived alone PTA.  If goes to SNF then post hospital needs are to be met at that LOC and no VBCI. Following.   VBCI Community Care, Population Health does not replace or interfere with any arrangements made by the Inpatient Transition of Care team.   For questions contact:   Charlesetta Shanks, RN, BSN, CCM [place]  [facility], [facility] Liaison Direct Dial: [phone] or secure chat Email: [name]

## 2023-02-07 NOTE — Plan of Care (Signed)
Problem: Education: Goal: Knowledge of General Education information will improve Description Including pain rating scale, medication(s)/side effects and non-pharmacologic comfort measures Outcome: Progressing

## 2023-02-07 NOTE — Plan of Care (Signed)

## 2023-02-07 NOTE — TOC Progression Note (Signed)
Transition of Care [facility]) - Progression Note    Patient Details  Name: [name] MRN: [id number] Date of Birth: July 09, 1945  Transition of Care [facility]) CM/SW Contact  Delilah Shan, LCSWA Phone Number: 02/07/2023, 3:14 PM  Clinical Narrative:     CSW faxed patient out for SNF placement. Patients passr pending. CSW submitted requested clinicals to [facility] must for review. CSW will continue to follow and assist with patients dc planning needs.  Expected Discharge Plan: Skilled Nursing Facility Barriers to Discharge: Continued Medical Work up  Expected Discharge Plan and Services In-house Referral: Clinical Social Work     Living arrangements for the past 2 months: Single Family Home                                       Social Determinants of Health (SDOH) Interventions SDOH Screenings   Food Insecurity: No Food Insecurity (02/06/2023)  Housing: Low Risk  (02/06/2023)  Transportation Needs: No Transportation Needs (02/06/2023)  Utilities: Not At Risk (02/06/2023)  Alcohol Screen: Low Risk  (06/14/2022)  Depression (PHQ2-9): Low Risk  (01/19/2023)  Financial Resource Strain: Low Risk  (01/21/2023)  Physical Activity: Unknown (01/21/2023)  Social Connections: Moderately Integrated (02/06/2023)  Recent Concern: Social Connections - Moderately Isolated (01/21/2023)  Stress: No Stress Concern Present (01/21/2023)  Tobacco Use: Medium Risk (02/05/2023)    Readmission Risk Interventions     No data to display

## 2023-02-07 NOTE — NC FL2 (Addendum)
[place] MEDICAID FL2 LEVEL OF CARE FORM     IDENTIFICATION  Patient Name: [name] Birthdate: January 16, 1945 Sex: male Admission Date (Current Location): 02/05/2023  [facility] and [place] Number:  Producer, television/film/video and Address:  [facility], [address] [place], [place] [address]      Provider Number: [id number]  Attending Physician Name and Address:  Lewie Chamber, MD  Relative Name and Phone Number:  Danella Sensing (sister) [phone]    Current Level of Care: Hospital Recommended Level of Care: Skilled Nursing Facility Prior Approval Number:    Date Approved/Denied:   PASRR Number: [id number] E Expires: 03/10/2023 Discharge Plan: SNF    Current Diagnoses: Patient Active Problem List   Diagnosis Date Noted   NSTEMI (non-ST elevated myocardial infarction) (HCC) 02/05/2023   Multifocal pneumonia 02/05/2023   RSV (respiratory syncytial virus pneumonia) 02/05/2023   AKI (acute kidney injury) (HCC) 02/05/2023   Rhabdomyolysis 02/05/2023   Metabolic acidosis 02/05/2023   Protein-calorie malnutrition, severe 11/29/2022   Dyslipidemia 11/27/2022   Pneumonia due to infectious organism 11/27/2022   GERD without esophagitis 11/27/2022   Chronic obstructive pulmonary disease (COPD) (HCC) 11/27/2022   Presbycusis of both ears 11/03/2022   Hyperuricemia 05/24/2021   S/P Nissen fundoplication (without gastrostomy tube) procedure 05/06/2019   Problems with swallowing and mastication    Stricture and stenosis of esophagus    History of lung cancer in adulthood 01/16/2019   Aortic atherosclerosis (HCC) 06/24/2018   S/P lumbar fusion 02/18/2018   Bipolar disorder (HCC) 08/06/2017   S/P lumbar laminectomy 06/22/2017   Coronary atherosclerosis 01/29/2017   Lung nodule seen on imaging study 01/29/2017   Maculopathy 12/28/2016   Patent foramen ovale    Abscess of upper lobe of left lung without pneumonia (HCC) 09/14/2015   Smoking greater than 30 pack  years 07/19/2015   Subclinical hypothyroidism 07/17/2014   Anxiety 07/08/2014   Dyspnea on exertion 07/08/2014   Memory deficit 07/31/2012   Cerebrovascular disease, unspecified 03/01/2012   Restless legs syndrome (RLS) 03/01/2012   Hereditary and idiopathic peripheral neuropathy 03/01/2012   COPD (chronic obstructive pulmonary disease) (HCC) 06/14/2010   BPH (benign prostatic hyperplasia) 06/08/2009   Late effects of cerebrovascular disease 07/19/2008   Bipolar I disorder, single manic episode, moderate (HCC) 07/14/2008   Fam hx-ischem heart disease 07/16/2006   Hyperlipidemia 01/10/2003   Herpesviral infection of perianal skin and rectum 01/10/2000    Orientation RESPIRATION BLADDER Height & Weight     Self, Place  Normal Continent, External catheter (External Urinary Catheter) Weight: 132 lb 15 oz (60.3 kg) Height:     BEHAVIORAL SYMPTOMS/MOOD NEUROLOGICAL BOWEL NUTRITION STATUS        Diet (Please see discharge summary)  AMBULATORY STATUS COMMUNICATION OF NEEDS Skin   Limited Assist Verbally Other (Comment) (Abrasion,knee,hip,elbow,hand,Bil.,blister,heel,L,ecchymosis,arm,leg,bil.,Wound/Incision LDAs,PI heel,L,deep tissue,Wound/Incision open or dehisced non-pressure wound hip,anterior,R,dark discolaration to R hip,Please see additional info)                       Personal Care Assistance Level of Assistance  Bathing, Feeding, Dressing Bathing Assistance: Maximum assistance   Dressing Assistance: Maximum assistance     Functional Limitations Info  Sight, Hearing, Speech   Hearing Info: Impaired (hearing aid)      SPECIAL CARE FACTORS FREQUENCY  PT (By licensed PT), OT (By licensed OT)     PT Frequency: 5x min weekly OT Frequency: 5x min weekly  Contractures Contractures Info: Not present    Additional Factors Info  Code Status, Allergies, Psychotropic, Isolation Precautions Code Status Info: DNR Allergies Info: Ropinirole  Hcl,Tramadol Psychotropic Info: ziprasidone (GEODON) capsule 40 mg daily at bedtime,divalproex (DEPAKOTE ER) 24 hr tablet 1,000 mg daily at bedtime   Isolation Precautions Info: Droplet precaution     Current Medications (02/07/2023):  This is the current hospital active medication list Current Facility-Administered Medications  Medication Dose Route Frequency Provider Last Rate Last Admin   acetaminophen (TYLENOL) tablet 650 mg  650 mg Oral Q6H PRN Gillis Santa, MD       Or   acetaminophen (TYLENOL) suppository 650 mg  650 mg Rectal Q6H PRN Gillis Santa, MD       apixaban Everlene Balls) tablet 2.5 mg  2.5 mg Oral BID Pokhrel, Laxman, MD   2.5 mg at 02/07/23 0936   bisacodyl (DULCOLAX) EC tablet 5 mg  5 mg Oral Daily PRN Gillis Santa, MD       cefTRIAXone (ROCEPHIN) 1 g in sodium chloride 0.9 % 100 mL IVPB  1 g Intravenous Q24H Gillis Santa, MD   Stopped at 02/06/23 2212   chlorpheniramine-HYDROcodone (TUSSIONEX) 10-8 MG/5ML suspension 5 mL  5 mL Oral Q12H PRN Gillis Santa, MD       divalproex (DEPAKOTE ER) 24 hr tablet 1,000 mg  1,000 mg Oral QHS Gillis Santa, MD   1,000 mg at 02/06/23 2143   doxycycline (VIBRAMYCIN) 100 mg in sodium chloride 0.9 % 250 mL IVPB  100 mg Intravenous Q12H Gillis Santa, MD 125 mL/hr at 02/07/23 0951 100 mg at 02/07/23 0951   guaiFENesin (MUCINEX) 12 hr tablet 600 mg  600 mg Oral BID Gillis Santa, MD   600 mg at 02/07/23 0936   ipratropium-albuterol (DUONEB) 0.5-2.5 (3) MG/3ML nebulizer solution 3 mL  3 mL Nebulization Q6H PRN Gillis Santa, MD       pantoprazole (PROTONIX) EC tablet 40 mg  40 mg Oral Daily Gillis Santa, MD   40 mg at 02/07/23 0936   polyethylene glycol (MIRALAX / GLYCOLAX) packet 17 g  17 g Oral Daily PRN Gillis Santa, MD       sodium chloride flush (NS) 0.9 % injection 3 mL  3 mL Intravenous Q12H Gillis Santa, MD   3 mL at 02/07/23 0952   sodium chloride flush (NS) 0.9 % injection 3 mL  3 mL Intravenous PRN Gillis Santa, MD        umeclidinium bromide (INCRUSE ELLIPTA) 62.5 MCG/ACT 1 puff  1 puff Inhalation Daily Gillis Santa, MD       ziprasidone (GEODON) capsule 40 mg  40 mg Oral QHS Gillis Santa, MD   40 mg at 02/06/23 2143     Discharge Medications: Please see discharge summary for a list of discharge medications.  Relevant Imaging Results:  Relevant Lab Results:   Additional Information [id number] Wound/Incision open or dehisced skin tear ,elbow,L,posterior,Wound/Incision open or dehisced skin tear,hand,L,posterior,Wound/Incision open or dehisced non-pressure wound,elbow,posterior,R,scabbed abrasion  Delilah Shan, LCSWA

## 2023-02-07 NOTE — Progress Notes (Signed)
Progress Note    [name]   ZOX:[id number]  DOB: 17-Jan-1945  DOA: 02/05/2023     2 PCP: Malva Limes, MD  Initial CC: weakness   Hospital Course: [name] is a 78 y.o. male with PMH of COPD, bipolar disorder, CVA, peripheral neuropathy, dementia, BPH, lung cancer s/p resection who was recently admitted hospital from 11/17 to 11/30/2022 due to pneumonia presented this time to the hospital with fall and weakness with difficulty ambulation and backache.  In the ED patient had stable vitals.  Lab showed creatinine elevation at 3.1 with AST elevation at  198. CK 7533, troponin was 02/14/2005.  Procalcitonin elevated at 6.2.  WBC elevated at 34 K. EKG atrial flutter HR 64 controlled.  CT scan of the head and CT C-spine negative.  CT of the chest was negative for PE, no aortic dissection.  Multifocal pneumonia and left lower lobe opacity possible reactivation of lung cancer.  Recommend PET scan as an outpatient  Non-STEMI, most likely demand ischemia EKG did not show any ischemic changes.  With significant troponin elevation but trending down.  On aspirin. Cardiology consulted and recommend outpatient stress test.  Atrial flutter, paroxysmal, currently in sinus rhythm - continue Eliquis    Sepsis due to Multifocal pneumonia Was tachypneic with leukocytosis and imaging showed multifocal pneumonia.  Procalcitonin was significantly elevated at 6.2.  Respiratory viral panel was positive for respiratory syncytial virus.  Has been empirically started on Rocephin and doxycycline..  Blood cultures pending at this time.  Continue supportive care.   RSV positive Symptomatic care.   AKI with metabolic acidosis most likely due to rhabdomyolysis Started on bicarb infusion.  Latest bicarb of 16.  Continue to monitor urine output.  - slowly improving   PAD - severe B/L LE PAD noted on CTA - discussed with his sisters; they wish to see how the does on Eliquis but understand he's likely a  poor invasive candidate   Rhabdomyolysis CK level 7533 on presentation.  S/p IV fluids. Trend CK level continue to hold statins for now.   History of bipolar disorder, dementia and anxiety Continue Geodon 40 mg p.o. nightly and Depakote 1000 mg at bedtime   COPD, resumed Incruse Ellipta home inhaler continue DuoNebs as needed   History of lung cancer, history of resection in 2020. CTA chest shows possible recurrence of lung cancer Recommend to follow with oncology as an outpatient to repeat PET scan for further management.   BPH, not on any medication.  Check bladder scan if needed.   GERD continue PPI  Interval History:  No events overnight.  Resting comfortably in bed.  Updated sisters bedside later in the afternoon as well.   Old records reviewed in assessment of this patient  Antimicrobials:   DVT prophylaxis:  apixaban (ELIQUIS) tablet 2.5 mg Start: 02/06/23 1115 apixaban (ELIQUIS) tablet 2.5 mg   Code Status:   Code Status: Do not attempt resuscitation (DNR) PRE-ARREST INTERVENTIONS DESIRED  Mobility Assessment (Last 72 Hours)     Mobility Assessment     Row Name 02/07/23 0900 02/07/23 0400 02/06/23 2205 02/06/23 1400 02/06/23 1118   Does patient have an order for bedrest or is patient medically unstable No - Continue assessment No - Continue assessment No - Continue assessment No - Continue assessment --   What is the highest level of mobility based on the progressive mobility assessment? Level 2 (Chairfast) - Balance while sitting on edge of bed and cannot stand Level 2 (  Chairfast) - Balance while sitting on edge of bed and cannot stand -- Level 2 (Chairfast) - Balance while sitting on edge of bed and cannot stand Level 4 (Walks with assist in room) - Balance while marching in place and cannot step forward and back - Complete   Is the above level different from baseline mobility prior to current illness? -- Yes - Recommend PT order -- Yes - Recommend PT order --             Barriers to discharge: none Disposition Plan:  SNF Status is: Inpt  Objective: Blood pressure 114/70, pulse 84, temperature 98.2 F (36.8 C), temperature source Oral, resp. rate 16, weight 60.3 kg, SpO2 93%.  Examination:  Physical Exam Constitutional:      General: He is not in acute distress.    Appearance: Normal appearance.     Comments: Chronically ill-appearing adult man lying in bed in no distress with slowed mentation and hard of hearing  HENT:     Head: Normocephalic and atraumatic.     Mouth/Throat:     Mouth: Mucous membranes are moist.  Eyes:     Extraocular Movements: Extraocular movements intact.  Cardiovascular:     Rate and Rhythm: Normal rate and regular rhythm.  Pulmonary:     Effort: Pulmonary effort is normal. No respiratory distress.     Breath sounds: Normal breath sounds. No wheezing.  Abdominal:     General: Bowel sounds are normal. There is no distension.     Palpations: Abdomen is soft.     Tenderness: There is no abdominal tenderness.  Musculoskeletal:        General: Normal range of motion.     Cervical back: Normal range of motion and neck supple.  Skin:    General: Skin is warm and dry.  Neurological:     Mental Status: He is disoriented.     Motor: Weakness present.  Psychiatric:        Mood and Affect: Mood normal.      Consultants:    Procedures:    Data Reviewed: Results for orders placed or performed during the hospital encounter of 02/05/23 (from the past 24 hours)  CK     Status: Abnormal   Collection Time: 02/07/23  5:23 AM  Result Value Ref Range   Total CK 7,258 (H) 49 - 397 U/L  CBC     Status: Abnormal   Collection Time: 02/07/23  5:23 AM  Result Value Ref Range   WBC 23.6 (H) 4.0 - 10.5 K/uL   RBC 2.98 (L) 4.22 - 5.81 MIL/uL   Hemoglobin 10.3 (L) 13.0 - 17.0 g/dL   HCT 56.2 (L) 13.0 - 86.5 %   MCV 100.0 80.0 - 100.0 fL   MCH 34.6 (H) 26.0 - 34.0 pg   MCHC 34.6 30.0 - 36.0 g/dL   RDW 78.4 69.6 -  29.5 %   Platelets 202 150 - 400 K/uL   nRBC 0.0 0.0 - 0.2 %  Magnesium     Status: None   Collection Time: 02/07/23  5:23 AM  Result Value Ref Range   Magnesium 2.1 1.7 - 2.4 mg/dL  Phosphorus     Status: None   Collection Time: 02/07/23  5:23 AM  Result Value Ref Range   Phosphorus 3.0 2.5 - 4.6 mg/dL  Comprehensive metabolic panel     Status: Abnormal   Collection Time: 02/07/23  5:23 AM  Result Value Ref Range   Sodium 137 135 -  145 mmol/L   Potassium 3.8 3.5 - 5.1 mmol/L   Chloride 102 98 - 111 mmol/L   CO2 25 22 - 32 mmol/L   Glucose, Bld 86 70 - 99 mg/dL   BUN 35 (H) 8 - 23 mg/dL   Creatinine, Ser 1.61 (H) 0.61 - 1.24 mg/dL   Calcium 8.2 (L) 8.9 - 10.3 mg/dL   Total Protein 5.7 (L) 6.5 - 8.1 g/dL   Albumin 2.2 (L) 3.5 - 5.0 g/dL   AST 096 (H) 15 - 41 U/L   ALT 109 (H) 0 - 44 U/L   Alkaline Phosphatase 65 38 - 126 U/L   Total Bilirubin 0.4 0.0 - 1.2 mg/dL   GFR, Estimated 48 (L) >60 mL/min   Anion gap 10 5 - 15    I have reviewed pertinent nursing notes, vitals, labs, and images as necessary. I have ordered labwork to follow up on as indicated.  I have reviewed the last notes from staff over past 24 hours. I have discussed patient's care plan and test results with nursing staff, CM/SW, and other staff as appropriate.  Time spent: Greater than 50% of the 55 minute visit was spent in counseling/coordination of care for the patient as laid out in the A&P.   LOS: 2 days   Lewie Chamber, MD [facility] 02/07/2023, 4:14 PM

## 2023-02-08 DIAGNOSIS — N179 Acute kidney failure, unspecified: Secondary | ICD-10-CM | POA: Diagnosis not present

## 2023-02-08 DIAGNOSIS — I214 Non-ST elevation (NSTEMI) myocardial infarction: Secondary | ICD-10-CM | POA: Diagnosis not present

## 2023-02-08 LAB — CBC WITH DIFFERENTIAL/PLATELET
Abs Immature Granulocytes: 0.19 10*3/uL — ABNORMAL HIGH (ref 0.00–0.07)
Basophils Absolute: 0 10*3/uL (ref 0.0–0.1)
Basophils Relative: 0 %
Eosinophils Absolute: 0 10*3/uL (ref 0.0–0.5)
Eosinophils Relative: 0 %
HCT: 26.6 % — ABNORMAL LOW (ref 39.0–52.0)
Hemoglobin: 9.1 g/dL — ABNORMAL LOW (ref 13.0–17.0)
Immature Granulocytes: 1 %
Lymphocytes Relative: 6 %
Lymphs Abs: 1 10*3/uL (ref 0.7–4.0)
MCH: 34.3 pg — ABNORMAL HIGH (ref 26.0–34.0)
MCHC: 34.2 g/dL (ref 30.0–36.0)
MCV: 100.4 fL — ABNORMAL HIGH (ref 80.0–100.0)
Monocytes Absolute: 1.3 10*3/uL — ABNORMAL HIGH (ref 0.1–1.0)
Monocytes Relative: 7 %
Neutro Abs: 14.5 10*3/uL — ABNORMAL HIGH (ref 1.7–7.7)
Neutrophils Relative %: 86 %
Platelets: 171 10*3/uL (ref 150–400)
RBC: 2.65 MIL/uL — ABNORMAL LOW (ref 4.22–5.81)
RDW: 14.6 % (ref 11.5–15.5)
WBC: 17.1 10*3/uL — ABNORMAL HIGH (ref 4.0–10.5)
nRBC: 0 % (ref 0.0–0.2)

## 2023-02-08 LAB — PHOSPHORUS: Phosphorus: 2.9 mg/dL (ref 2.5–4.6)

## 2023-02-08 LAB — COMPREHENSIVE METABOLIC PANEL
ALT: 96 U/L — ABNORMAL HIGH (ref 0–44)
AST: 294 U/L — ABNORMAL HIGH (ref 15–41)
Albumin: 2 g/dL — ABNORMAL LOW (ref 3.5–5.0)
Alkaline Phosphatase: 53 U/L (ref 38–126)
Anion gap: 10 (ref 5–15)
BUN: 23 mg/dL (ref 8–23)
CO2: 25 mmol/L (ref 22–32)
Calcium: 8.2 mg/dL — ABNORMAL LOW (ref 8.9–10.3)
Chloride: 102 mmol/L (ref 98–111)
Creatinine, Ser: 1.16 mg/dL (ref 0.61–1.24)
GFR, Estimated: >60 mL/min (ref >60)
Glucose, Bld: 88 mg/dL (ref 70–99)
Potassium: 3.8 mmol/L (ref 3.5–5.1)
Sodium: 137 mmol/L (ref 135–145)
Total Bilirubin: 0.5 mg/dL (ref 0.0–1.2)
Total Protein: 5.2 g/dL — ABNORMAL LOW (ref 6.5–8.1)

## 2023-02-08 LAB — CK: Total CK: 2891 U/L — ABNORMAL HIGH (ref 49–397)

## 2023-02-08 LAB — MAGNESIUM: Magnesium: 1.9 mg/dL (ref 1.7–2.4)

## 2023-02-08 MED ORDER — APIXABAN 5 MG PO TABS
5.0000 mg | ORAL_TABLET | Freq: Two times a day (BID) | ORAL | Status: DC
Start: 2023-02-08 — End: 2023-02-12
  Administered 2023-02-08 – 2023-02-12 (×8): 5 mg via ORAL
  Filled 2023-02-08 (×8): qty 1

## 2023-02-08 NOTE — Progress Notes (Signed)
Physical Therapy Treatment Patient Details Name: [name] MRN: [id number] DOB: [birth year] Today's Date: 02/08/2023   History of Present Illness [name] is a 78 y.o. male admitted 02/05/23 with weakness and fall at home.  Found to have NSTEMI, multifocal pneumonia, RSV, AKI, Rhabdomyolysis and metabolic acidosis.  PMH significant for bipolar disorder, BPH, depression, GERD, COPD, peripheral neuropathy, dyslipidemia, dementia, history of lung cancer status post surgery and lumbar fusion    PT Comments  Session focused on functional mobility and theract to promote improvement of transfers and gait. Pt required mod-max cueing (verbal, tactile, and hand over hand) with each intervention for proper sequencing, safety, and hand placement. Pt responded to hand over hand cueing with the most carryover for the task at hand, however he was unable to recall the correct positioning when asked later in the session. Pt would benefit from future sessions to promote functional mobility, activity tolerance,  gait, balance, and LE strength. Will continue to follow acutely.    If plan is discharge home, recommend the following: A lot of help with walking and/or transfers;A lot of help with bathing/dressing/bathroom;Assistance with cooking/housework;Assist for transportation;Help with stairs or ramp for entrance   Can travel by private vehicle     No  Equipment Recommendations  Other (comment) (defer to next venue of care)    Recommendations for Other Services       Precautions / Restrictions Precautions Precautions: Fall Restrictions Weight Bearing Restrictions Per Provider Order: No     Mobility  Bed Mobility Overal bed mobility: Needs Assistance Bed Mobility: Supine to Sit     Supine to sit: Mod assist, Used rails, HOB elevated     General bed mobility comments: Pt with poor initaition of bed mobility, required extra time, VC, TC, and hand over hand to guide hand placement and  proper sequencing to perform bed mobility    Transfers Overall transfer level: Needs assistance Equipment used: Rolling walker (2 wheels) Transfers: Sit to/from Stand Sit to Stand: Min assist, +2 safety/equipment           General transfer comment: Pt required VC and TC to rise with hand over hand for hand cueing for placing hand on bed and RW providing the most assistance to complete the transfer. Completed with increased forward lean and required  extra time. Cueing for maintaining upright trunk posture.    Ambulation/Gait Ambulation/Gait assistance: Mod assist, +2 safety/equipment   Assistive device: Rolling walker (2 wheels) Gait Pattern/deviations: Step-to pattern, Shuffle, Decreased stride length, Trunk flexed Gait velocity: reduced Gait velocity interpretation: <1.31 ft/sec, indicative of household ambulator   General Gait Details: PT ambulated with short, shuffling, step-to pattern. Increased truncal flexion that reduced with TC. Mod A for RW management. VC to remain inside RW   Stairs             Wheelchair Mobility     Tilt Bed    Modified Rankin (Stroke Patients Only)       Balance Overall balance assessment: Needs assistance Sitting-balance support: No upper extremity supported, Feet supported, Single extremity supported Sitting balance-Leahy Scale: Fair   Postural control: Left lateral lean Standing balance support: Reliant on assistive device for balance, Bilateral upper extremity supported, During functional activity Standing balance-Leahy Scale: Poor Standing balance comment: Pt reliant on RW. Stood with increased forward trunk flexion. No LOB  Cognition   Behavior During Therapy: Flat affect Overall Cognitive Status: No family/caregiver present to determine baseline cognitive functioning                                 General Comments: Pt not oriented to time or situation. Slow  processing, impaired command following (followed one step commands with tactile hand over hand cueing providing the most benefit), hard of hearing, garbled and low speech.        Exercises      General Comments General comments (skin integrity, edema, etc.): VSS throughout sesion.      Pertinent Vitals/Pain Pain Assessment Pain Assessment: Faces Faces Pain Scale: No hurt Pain Intervention(s): Monitored during session    Home Living                          Prior Function            PT Goals (current goals can now be found in the care plan section) Acute Rehab PT Goals Patient Stated Goal: get strength back PT Goal Formulation: With patient Time For Goal Achievement: 02/20/23 Potential to Achieve Goals: Fair Progress towards PT goals: Progressing toward goals    Frequency    Min 1X/week      PT Plan      Co-evaluation              AM-PAC PT "6 Clicks" Mobility   Outcome Measure  Help needed turning from your back to your side while in a flat bed without using bedrails?: A Little Help needed moving from lying on your back to sitting on the side of a flat bed without using bedrails?: A Lot Help needed moving to and from a bed to a chair (including a wheelchair)?: A Lot Help needed standing up from a chair using your arms (e.g., wheelchair or bedside chair)?: A Little Help needed to walk in hospital room?: A Lot Help needed climbing 3-5 steps with a railing? : Total 6 Click Score: 13    End of Session Equipment Utilized During Treatment: Gait belt Activity Tolerance: Patient limited by fatigue Patient left: in chair;with call bell/phone within reach;with chair alarm set Nurse Communication: Mobility status PT Visit Diagnosis: Muscle weakness (generalized) (M62.81);Other abnormalities of gait and mobility (R26.89);History of falling (Z91.81)     Time: 1523-1600 PT Time Calculation (min) (ACUTE ONLY): 37 min  Charges:    $Gait Training:  8-22 mins $Therapeutic Activity: 8-22 mins PT General Charges $$ ACUTE PT VISIT: 1 Visit                    [address], SPT   [place] 02/08/2023, 6:38 PM

## 2023-02-08 NOTE — Progress Notes (Signed)
Progress Note    [name]   ZHY:[id number]  DOB: [birth year]  DOA: 02/05/2023     3 PCP: Malva Limes, MD  Initial CC: weakness   Hospital Course: [name] is a 78 y.o. male with PMH of COPD, bipolar disorder, CVA, peripheral neuropathy, dementia, BPH, lung cancer s/p resection who was recently admitted hospital from 11/17 to 11/30/2022 due to pneumonia presented this time to the hospital with fall and weakness with difficulty ambulation and backache.  In the ED patient had stable vitals.  Lab showed creatinine elevation at 3.1 with AST elevation at  198. CK 7533, troponin was 02/14/2005.  Procalcitonin elevated at 6.2.  WBC elevated at 34 K. EKG atrial flutter HR 64 controlled.  CT scan of the head and CT C-spine negative.  CT of the chest was negative for PE, no aortic dissection.  Multifocal pneumonia and left lower lobe opacity possible reactivation of lung cancer.  Recommend PET scan as an outpatient  Non-STEMI, most likely demand ischemia EKG did not show any ischemic changes.  With significant troponin elevation but trending down.  On aspirin. Cardiology consulted and recommend outpatient stress test.  Atrial flutter, paroxysmal, currently in sinus rhythm - continue Eliquis    Sepsis due to Multifocal pneumonia Was tachypneic with leukocytosis and imaging showed multifocal pneumonia.  Procalcitonin was significantly elevated at 6.2.  Respiratory viral panel was positive for respiratory syncytial virus.  Has been empirically started on Rocephin and doxycycline..  Blood cultures pending at this time.  Continue supportive care.   RSV positive Symptomatic care.   AKI with metabolic acidosis most likely due to rhabdomyolysis Started on bicarb infusion.  Latest bicarb of 16.  Continue to monitor urine output.  - slowly improving   PAD - severe B/L LE PAD noted on CTA - discussed with his sisters; they wish to see how the does on Eliquis but understand he's likely a  poor invasive candidate   Rhabdomyolysis CK level 7533 on presentation.  S/p IV fluids. Trend CK level continue to hold statins for now. - trending down but still elevated - repeat CK in am again    History of bipolar disorder, dementia and anxiety Continue Geodon 40 mg p.o. nightly and Depakote 1000 mg at bedtime   COPD, resumed Incruse Ellipta home inhaler continue DuoNebs as needed   History of lung cancer, history of resection in 2020. CTA chest shows possible recurrence of lung cancer Recommend to follow with oncology as an outpatient to repeat PET scan for further management.   BPH, not on any medication.  Check bladder scan if needed.   GERD continue PPI  Interval History:  No events overnight.  Resting comfortably in bed.  Hopefully ready for d/c in 1-2 days as CK comes down further.    Old records reviewed in assessment of this patient  Antimicrobials:   DVT prophylaxis:   apixaban (ELIQUIS) tablet 5 mg   Code Status:   Code Status: Do not attempt resuscitation (DNR) PRE-ARREST INTERVENTIONS DESIRED  Mobility Assessment (Last 72 Hours)     Mobility Assessment     Row Name 02/08/23 1617 02/08/23 1116 02/08/23 0400 02/07/23 2141 02/07/23 1735   Does patient have an order for bedrest or is patient medically unstable No - Continue assessment No - Continue assessment No - Continue assessment No - Continue assessment --   What is the highest level of mobility based on the progressive mobility assessment? Level 4 (Walks with assist in  room) - Balance while marching in place and cannot step forward and back - Complete Level 1 (Bedfast) - Unable to balance while sitting on edge of bed Level 2 (Chairfast) - Balance while sitting on edge of bed and cannot stand -- Level 3 (Stands with assist) - Balance while standing  and cannot march in place   Is the above level different from baseline mobility prior to current illness? -- Yes - Recommend PT order Yes - Recommend PT order  -- --    Row Name 02/07/23 0900 02/07/23 0400 02/06/23 2205 02/06/23 1400 02/06/23 1118   Does patient have an order for bedrest or is patient medically unstable No - Continue assessment No - Continue assessment No - Continue assessment No - Continue assessment --   What is the highest level of mobility based on the progressive mobility assessment? Level 2 (Chairfast) - Balance while sitting on edge of bed and cannot stand Level 2 (Chairfast) - Balance while sitting on edge of bed and cannot stand -- Level 2 (Chairfast) - Balance while sitting on edge of bed and cannot stand Level 4 (Walks with assist in room) - Balance while marching in place and cannot step forward and back - Complete   Is the above level different from baseline mobility prior to current illness? -- Yes - Recommend PT order -- Yes - Recommend PT order --            Barriers to discharge: none Disposition Plan:  SNF Status is: Inpt  Objective: Blood pressure 139/70, pulse 80, temperature (!) 97.5 F (36.4 C), temperature source Oral, resp. rate 18, height 5\' 10"  (1.778 m), weight 60.2 kg, SpO2 93%.  Examination:  Physical Exam Constitutional:      General: He is not in acute distress.    Appearance: Normal appearance.     Comments: Chronically ill-appearing adult man lying in bed in no distress with slowed mentation and hard of hearing  HENT:     Head: Normocephalic and atraumatic.     Mouth/Throat:     Mouth: Mucous membranes are moist.  Eyes:     Extraocular Movements: Extraocular movements intact.  Cardiovascular:     Rate and Rhythm: Normal rate and regular rhythm.  Pulmonary:     Effort: Pulmonary effort is normal. No respiratory distress.     Breath sounds: Normal breath sounds. No wheezing.  Abdominal:     General: Bowel sounds are normal. There is no distension.     Palpations: Abdomen is soft.     Tenderness: There is no abdominal tenderness.  Musculoskeletal:        General: Normal range of  motion.     Cervical back: Normal range of motion and neck supple.  Skin:    General: Skin is warm and dry.  Neurological:     Mental Status: He is disoriented.     Motor: Weakness present.  Psychiatric:        Mood and Affect: Mood normal.      Consultants:    Procedures:    Data Reviewed: Results for orders placed or performed during the hospital encounter of 02/05/23 (from the past 24 hours)  Ammonia     Status: None   Collection Time: 02/07/23  8:31 PM  Result Value Ref Range   Ammonia 23 9 - 35 umol/L  Valproic acid level     Status: None   Collection Time: 02/07/23  8:34 PM  Result Value Ref Range   Valproic  Acid Lvl 75 50.0 - 100.0 ug/mL  CK     Status: Abnormal   Collection Time: 02/08/23  3:51 AM  Result Value Ref Range   Total CK 2,891 (H) 49 - 397 U/L  Magnesium     Status: None   Collection Time: 02/08/23  3:51 AM  Result Value Ref Range   Magnesium 1.9 1.7 - 2.4 mg/dL  Phosphorus     Status: None   Collection Time: 02/08/23  3:51 AM  Result Value Ref Range   Phosphorus 2.9 2.5 - 4.6 mg/dL  Comprehensive metabolic panel     Status: Abnormal   Collection Time: 02/08/23  3:51 AM  Result Value Ref Range   Sodium 137 135 - 145 mmol/L   Potassium 3.8 3.5 - 5.1 mmol/L   Chloride 102 98 - 111 mmol/L   CO2 25 22 - 32 mmol/L   Glucose, Bld 88 70 - 99 mg/dL   BUN 23 8 - 23 mg/dL   Creatinine, Ser 7.82 0.61 - 1.24 mg/dL   Calcium 8.2 (L) 8.9 - 10.3 mg/dL   Total Protein 5.2 (L) 6.5 - 8.1 g/dL   Albumin 2.0 (L) 3.5 - 5.0 g/dL   AST 956 (H) 15 - 41 U/L   ALT 96 (H) 0 - 44 U/L   Alkaline Phosphatase 53 38 - 126 U/L   Total Bilirubin 0.5 0.0 - 1.2 mg/dL   GFR, Estimated >21 >30 mL/min   Anion gap 10 5 - 15  CBC with Differential/Platelet     Status: Abnormal   Collection Time: 02/08/23  3:51 AM  Result Value Ref Range   WBC 17.1 (H) 4.0 - 10.5 K/uL   RBC 2.65 (L) 4.22 - 5.81 MIL/uL   Hemoglobin 9.1 (L) 13.0 - 17.0 g/dL   HCT 86.5 (L) 78.4 - 69.6 %   MCV  100.4 (H) 80.0 - 100.0 fL   MCH 34.3 (H) 26.0 - 34.0 pg   MCHC 34.2 30.0 - 36.0 g/dL   RDW 29.5 28.4 - 13.2 %   Platelets 171 150 - 400 K/uL   nRBC 0.0 0.0 - 0.2 %   Neutrophils Relative % 86 %   Neutro Abs 14.5 (H) 1.7 - 7.7 K/uL   Lymphocytes Relative 6 %   Lymphs Abs 1.0 0.7 - 4.0 K/uL   Monocytes Relative 7 %   Monocytes Absolute 1.3 (H) 0.1 - 1.0 K/uL   Eosinophils Relative 0 %   Eosinophils Absolute 0.0 0.0 - 0.5 K/uL   Basophils Relative 0 %   Basophils Absolute 0.0 0.0 - 0.1 K/uL   Immature Granulocytes 1 %   Abs Immature Granulocytes 0.19 (H) 0.00 - 0.07 K/uL    I have reviewed pertinent nursing notes, vitals, labs, and images as necessary. I have ordered labwork to follow up on as indicated.  I have reviewed the last notes from staff over past 24 hours. I have discussed patient's care plan and test results with nursing staff, CM/SW, and other staff as appropriate.     LOS: 3 days   Lewie Chamber, MD [facility] 02/08/2023, 5:46 PM

## 2023-02-08 NOTE — Plan of Care (Signed)
Problem: Activity: Goal: Risk for activity intolerance will decrease Outcome: Progressing

## 2023-02-08 NOTE — TOC Progression Note (Addendum)
Transition of Care [facility]) - Progression Note    Patient Details  Name: [name] MRN: [id number] Date of Birth: [birth year]  [facility] CM/SW Contact  Delilah Shan, LCSWA Phone Number: 02/08/2023, 1:28 PM  Clinical Narrative:     CSW spoke with Danella Sensing patients sister and provided SNF bed offers. Patients sister would like to review and will give CSW a call back with SNF choice. CSW LVM with Tammy with [facility]. CSW awaiting call back. CSW will continue to follow.  Update- CSW spoke with Tammy with [facility] and started insurance authorization for patient for SNF and PTAR.  Update- CSW spoke with patients spouse Danella Sensing. Danella Sensing accepted SNF bed offer with [facility]. CSW spoke with Dabe with [facility] who confirmed SNF bed for patient. CSW spoke with Tammy with [facility] and added patients facility choice to patients insurance authorization for SNF. CSW will continue to follow.    Expected Discharge Plan: Skilled Nursing Facility Barriers to Discharge: Continued Medical Work up  Expected Discharge Plan and Services In-house Referral: Clinical Social Work     Living arrangements for the past 2 months: Single Family Home                                       Social Determinants of Health (SDOH) Interventions SDOH Screenings   Food Insecurity: No Food Insecurity (02/06/2023)  Housing: Low Risk  (02/06/2023)  Transportation Needs: No Transportation Needs (02/06/2023)  Utilities: Not At Risk (02/06/2023)  Alcohol Screen: Low Risk  (06/14/2022)  Depression (PHQ2-9): Low Risk  (01/19/2023)  Financial Resource Strain: Low Risk  (01/21/2023)  Physical Activity: Unknown (01/21/2023)  Social Connections: Moderately Integrated (02/06/2023)  Recent Concern: Social Connections - Moderately Isolated (01/21/2023)  Stress: No Stress Concern Present (01/21/2023)  Tobacco Use: Medium Risk (02/05/2023)    Readmission Risk Interventions     No data to display

## 2023-02-08 NOTE — Telephone Encounter (Signed)
Patient's chart reviewed.  He is still currently admitted. Plans to released to SNF.  Admitted with NSTEMI/ RSV/PNA.

## 2023-02-08 NOTE — Progress Notes (Signed)
RE: [name] Date of Birth: 1945-04-06 Date: 02/08/2023  Please be advised that the above-named patient has a primary diagnosis of dementia which supersedes any psychiatric diagnosis. Patient will require a short-term nursing home stay - anticipated 30 days or less for rehabilitation and strengthening. The plan is for return home.

## 2023-02-08 NOTE — Plan of Care (Signed)

## 2023-02-09 DIAGNOSIS — N179 Acute kidney failure, unspecified: Secondary | ICD-10-CM | POA: Diagnosis not present

## 2023-02-09 DIAGNOSIS — I214 Non-ST elevation (NSTEMI) myocardial infarction: Secondary | ICD-10-CM | POA: Diagnosis not present

## 2023-02-09 DIAGNOSIS — M6282 Rhabdomyolysis: Secondary | ICD-10-CM | POA: Diagnosis not present

## 2023-02-09 LAB — CBC WITH DIFFERENTIAL/PLATELET
Abs Immature Granulocytes: 0.21 10*3/uL — ABNORMAL HIGH (ref 0.00–0.07)
Basophils Absolute: 0.1 10*3/uL (ref 0.0–0.1)
Basophils Relative: 0 %
Eosinophils Absolute: 0 10*3/uL (ref 0.0–0.5)
Eosinophils Relative: 0 %
HCT: 27.7 % — ABNORMAL LOW (ref 39.0–52.0)
Hemoglobin: 9.5 g/dL — ABNORMAL LOW (ref 13.0–17.0)
Immature Granulocytes: 1 %
Lymphocytes Relative: 7 %
Lymphs Abs: 1.2 10*3/uL (ref 0.7–4.0)
MCH: 34.2 pg — ABNORMAL HIGH (ref 26.0–34.0)
MCHC: 34.3 g/dL (ref 30.0–36.0)
MCV: 99.6 fL (ref 80.0–100.0)
Monocytes Absolute: 1.2 10*3/uL — ABNORMAL HIGH (ref 0.1–1.0)
Monocytes Relative: 7 %
Neutro Abs: 14.4 10*3/uL — ABNORMAL HIGH (ref 1.7–7.7)
Neutrophils Relative %: 85 %
Platelets: 167 10*3/uL (ref 150–400)
RBC: 2.78 MIL/uL — ABNORMAL LOW (ref 4.22–5.81)
RDW: 14.2 % (ref 11.5–15.5)
WBC: 17 10*3/uL — ABNORMAL HIGH (ref 4.0–10.5)
nRBC: 0 % (ref 0.0–0.2)

## 2023-02-09 LAB — COMPREHENSIVE METABOLIC PANEL
ALT: 82 U/L — ABNORMAL HIGH (ref 0–44)
AST: 195 U/L — ABNORMAL HIGH (ref 15–41)
Albumin: 1.8 g/dL — ABNORMAL LOW (ref 3.5–5.0)
Alkaline Phosphatase: 57 U/L (ref 38–126)
Anion gap: 5 (ref 5–15)
BUN: 25 mg/dL — ABNORMAL HIGH (ref 8–23)
CO2: 25 mmol/L (ref 22–32)
Calcium: 8.4 mg/dL — ABNORMAL LOW (ref 8.9–10.3)
Chloride: 105 mmol/L (ref 98–111)
Creatinine, Ser: 1.3 mg/dL — ABNORMAL HIGH (ref 0.61–1.24)
GFR, Estimated: 57 mL/min — ABNORMAL LOW (ref >60)
Glucose, Bld: 83 mg/dL (ref 70–99)
Potassium: 3.5 mmol/L (ref 3.5–5.1)
Sodium: 135 mmol/L (ref 135–145)
Total Bilirubin: 0.6 mg/dL (ref 0.0–1.2)
Total Protein: 5 g/dL — ABNORMAL LOW (ref 6.5–8.1)

## 2023-02-09 LAB — CK: Total CK: 1663 U/L — ABNORMAL HIGH (ref 49–397)

## 2023-02-09 MED ORDER — ORAL CARE MOUTH RINSE: 15.0000 mL | OROMUCOSAL | Status: DC | PRN

## 2023-02-09 MED ORDER — POTASSIUM CHLORIDE 20 MEQ PO PACK
40.0000 meq | PACK | Freq: Once | ORAL | Status: AC
  Administered 2023-02-09: 40 meq via ORAL
  Filled 2023-02-09: qty 2

## 2023-02-09 NOTE — Progress Notes (Signed)
Progress Note    [name]   ZOX:[id number]  DOB: 10-22-1945  DOA: 02/05/2023     4 PCP: Malva Limes, MD  Initial CC: weakness   Hospital Course: [name] is a 78 y.o. male with PMH of COPD, bipolar disorder, CVA, peripheral neuropathy, dementia, BPH, lung cancer s/p resection who was recently admitted hospital from 11/17 to 11/30/2022 due to pneumonia presented this time to the hospital with fall and weakness with difficulty ambulation and backache.  In the ED patient had stable vitals.  Lab showed creatinine elevation at 3.1 with AST elevation at  198. CK 7533, troponin was 02/14/2005.  Procalcitonin elevated at 6.2.  WBC elevated at 34 K. EKG atrial flutter HR 64 controlled.  CT scan of the head and CT C-spine negative.  CT of the chest was negative for PE, no aortic dissection.  Multifocal pneumonia and left lower lobe opacity possible reactivation of lung cancer.  Recommend PET scan as an outpatient  Non-STEMI, most likely demand ischemia EKG did not show any ischemic changes.  With significant troponin elevation but trending down.  On aspirin. Cardiology consulted and recommend outpatient stress test.  Atrial flutter, paroxysmal, currently in sinus rhythm - continue Eliquis    Sepsis due to Multifocal pneumonia Was tachypneic with leukocytosis and imaging showed multifocal pneumonia.  Procalcitonin was significantly elevated at 6.2.  Respiratory viral panel was positive for respiratory syncytial virus.  Has been empirically started on Rocephin and doxycycline..  Blood cultures pending at this time.  Continue supportive care.   RSV positive Symptomatic care.   AKI with metabolic acidosis most likely due to rhabdomyolysis Started on bicarb infusion.  Latest bicarb of 16.  Continue to monitor urine output.  - slowly improving   PAD - severe B/L LE PAD noted on CTA - discussed with his sisters; they wish to see how the does on Eliquis but understand he's likely a  poor invasive candidate   Rhabdomyolysis CK level 7533 on presentation.  S/p IV fluids. Trend CK level continue to hold statins for now. - trending down   History of bipolar disorder, dementia and anxiety Continue Geodon 40 mg p.o. nightly and Depakote 1000 mg at bedtime   COPD, resumed Incruse Ellipta home inhaler continue DuoNebs as needed   History of lung cancer, history of resection in 2020. CTA chest shows possible recurrence of lung cancer Recommend to follow with oncology as an outpatient to repeat PET scan for further management.   BPH, not on any medication.  Check bladder scan if needed.   GERD continue PPI  Interval History:  No events overnight.  Awaiting insurance auth. He's medically stable for discharge.    Old records reviewed in assessment of this patient  Antimicrobials:   DVT prophylaxis:   apixaban (ELIQUIS) tablet 5 mg   Code Status:   Code Status: Do not attempt resuscitation (DNR) PRE-ARREST INTERVENTIONS DESIRED  Mobility Assessment (Last 72 Hours)     Mobility Assessment     Row Name 02/09/23 [phone] 02/09/23 1478 02/08/23 1941 02/08/23 1736 02/08/23 1617   Does patient have an order for bedrest or is patient medically unstable No - Continue assessment No - Continue assessment No - Continue assessment -- No - Continue assessment   What is the highest level of mobility based on the progressive mobility assessment? Level 5 (Walks with assist in room/hall) - Balance while stepping forward/back and can walk in room with assist - Complete Level 5 (Walks with  assist in room/hall) - Balance while stepping forward/back and can walk in room with assist - Complete Level 5 (Walks with assist in room/hall) - Balance while stepping forward/back and can walk in room with assist - Complete Level 5 (Walks with assist in room/hall) - Balance while stepping forward/back and can walk in room with assist - Complete Level 4 (Walks with assist in room) - Balance while  marching in place and cannot step forward and back - Complete   Is the above level different from baseline mobility prior to current illness? Yes - Recommend PT order Yes - Recommend PT order Yes - Recommend PT order -- --    Row Name 02/08/23 1600 02/08/23 1116 02/08/23 0400 02/07/23 2141 02/07/23 1735   Does patient have an order for bedrest or is patient medically unstable No - Continue assessment No - Continue assessment No - Continue assessment No - Continue assessment --   What is the highest level of mobility based on the progressive mobility assessment? Level 1 (Bedfast) - Unable to balance while sitting on edge of bed Level 1 (Bedfast) - Unable to balance while sitting on edge of bed Level 2 (Chairfast) - Balance while sitting on edge of bed and cannot stand -- Level 3 (Stands with assist) - Balance while standing  and cannot march in place   Is the above level different from baseline mobility prior to current illness? Yes - Recommend PT order Yes - Recommend PT order Yes - Recommend PT order -- --    Row Name 02/07/23 0900 02/07/23 0400 02/06/23 2205       Does patient have an order for bedrest or is patient medically unstable No - Continue assessment No - Continue assessment No - Continue assessment     What is the highest level of mobility based on the progressive mobility assessment? Level 2 (Chairfast) - Balance while sitting on edge of bed and cannot stand Level 2 (Chairfast) - Balance while sitting on edge of bed and cannot stand --     Is the above level different from baseline mobility prior to current illness? -- Yes - Recommend PT order --              Barriers to discharge: none Disposition Plan:  SNF Status is: Inpt  Objective: Blood pressure 126/74, pulse 66, temperature 98 F (36.7 C), temperature source Axillary, resp. rate 14, height 5\' 10"  (1.778 m), weight 59.9 kg, SpO2 98%.  Examination:  Physical Exam Constitutional:      General: He is not in acute  distress.    Appearance: Normal appearance.     Comments: Chronically ill-appearing adult man lying in bed in no distress with slowed mentation and hard of hearing  HENT:     Head: Normocephalic and atraumatic.     Mouth/Throat:     Mouth: Mucous membranes are moist.  Eyes:     Extraocular Movements: Extraocular movements intact.  Cardiovascular:     Rate and Rhythm: Normal rate and regular rhythm.  Pulmonary:     Effort: Pulmonary effort is normal. No respiratory distress.     Breath sounds: Normal breath sounds. No wheezing.  Abdominal:     General: Bowel sounds are normal. There is no distension.     Palpations: Abdomen is soft.     Tenderness: There is no abdominal tenderness.  Musculoskeletal:        General: Normal range of motion.     Cervical back: Normal range of motion  and neck supple.  Skin:    General: Skin is warm and dry.  Neurological:     Mental Status: He is disoriented.     Motor: Weakness present.  Psychiatric:        Mood and Affect: Mood normal.      Consultants:    Procedures:    Data Reviewed: Results for orders placed or performed during the hospital encounter of 02/05/23 (from the past 24 hours)  CK     Status: Abnormal   Collection Time: 02/09/23  4:35 AM  Result Value Ref Range   Total CK 1,663 (H) 49 - 397 U/L  Comprehensive metabolic panel     Status: Abnormal   Collection Time: 02/09/23  4:35 AM  Result Value Ref Range   Sodium 135 135 - 145 mmol/L   Potassium 3.5 3.5 - 5.1 mmol/L   Chloride 105 98 - 111 mmol/L   CO2 25 22 - 32 mmol/L   Glucose, Bld 83 70 - 99 mg/dL   BUN 25 (H) 8 - 23 mg/dL   Creatinine, Ser 1.61 (H) 0.61 - 1.24 mg/dL   Calcium 8.4 (L) 8.9 - 10.3 mg/dL   Total Protein 5.0 (L) 6.5 - 8.1 g/dL   Albumin 1.8 (L) 3.5 - 5.0 g/dL   AST 096 (H) 15 - 41 U/L   ALT 82 (H) 0 - 44 U/L   Alkaline Phosphatase 57 38 - 126 U/L   Total Bilirubin 0.6 0.0 - 1.2 mg/dL   GFR, Estimated 57 (L) >60 mL/min   Anion gap 5 5 - 15  CBC  with Differential/Platelet     Status: Abnormal   Collection Time: 02/09/23  4:35 AM  Result Value Ref Range   WBC 17.0 (H) 4.0 - 10.5 K/uL   RBC 2.78 (L) 4.22 - 5.81 MIL/uL   Hemoglobin 9.5 (L) 13.0 - 17.0 g/dL   HCT 04.5 (L) 40.9 - 81.1 %   MCV 99.6 80.0 - 100.0 fL   MCH 34.2 (H) 26.0 - 34.0 pg   MCHC 34.3 30.0 - 36.0 g/dL   RDW 91.4 78.2 - 95.6 %   Platelets 167 150 - 400 K/uL   nRBC 0.0 0.0 - 0.2 %   Neutrophils Relative % 85 %   Neutro Abs 14.4 (H) 1.7 - 7.7 K/uL   Lymphocytes Relative 7 %   Lymphs Abs 1.2 0.7 - 4.0 K/uL   Monocytes Relative 7 %   Monocytes Absolute 1.2 (H) 0.1 - 1.0 K/uL   Eosinophils Relative 0 %   Eosinophils Absolute 0.0 0.0 - 0.5 K/uL   Basophils Relative 0 %   Basophils Absolute 0.1 0.0 - 0.1 K/uL   Immature Granulocytes 1 %   Abs Immature Granulocytes 0.21 (H) 0.00 - 0.07 K/uL    I have reviewed pertinent nursing notes, vitals, labs, and images as necessary. I have ordered labwork to follow up on as indicated.  I have reviewed the last notes from staff over past 24 hours. I have discussed patient's care plan and test results with nursing staff, CM/SW, and other staff as appropriate.     LOS: 4 days   Lewie Chamber, MD [facility] 02/09/2023, 2:49 PM

## 2023-02-09 NOTE — Plan of Care (Signed)
  Problem: Clinical Measurements: Goal: Ability to maintain clinical measurements within normal limits will improve Outcome: Progressing Goal: Respiratory complications will improve Outcome: Progressing Goal: Cardiovascular complication will be avoided Outcome: Progressing   Problem: Safety: Goal: Ability to remain free from injury will improve Outcome: Progressing

## 2023-02-09 NOTE — TOC Progression Note (Addendum)
Transition of Care [facility]) - Progression Note    Patient Details  Name: [name] MRN: [id number] Date of Birth: [birth year]  [facility] CM/SW Contact  Isahi Godwin A [place], [place] Phone Number: 02/09/2023, 10:18 AM  Clinical Narrative:     Update 1046: CSW spoke with Junious Dresser at [facility], pt is still in clinical review, requested contact back to this CSW when decision made for auth.   CSW contacted [facility] and left VM with Tammy at regarding status of pt's insurance authorization and to follow up with this CSW on decision for authorization. Pt is stable for DC and bed is available at facility.    TOC will continue to follow.   Expected Discharge Plan: Skilled Nursing Facility Barriers to Discharge: Continued Medical Work up  Expected Discharge Plan and Services In-house Referral: Clinical Social Work     Living arrangements for the past 2 months: Single Family Home                                       Social Determinants of Health (SDOH) Interventions SDOH Screenings   Food Insecurity: No Food Insecurity (02/06/2023)  Housing: Low Risk  (02/06/2023)  Transportation Needs: No Transportation Needs (02/06/2023)  Utilities: Not At Risk (02/06/2023)  Alcohol Screen: Low Risk  (06/14/2022)  Depression (PHQ2-9): Low Risk  (01/19/2023)  Financial Resource Strain: Low Risk  (01/21/2023)  Physical Activity: Unknown (01/21/2023)  Social Connections: Moderately Integrated (02/06/2023)  Recent Concern: Social Connections - Moderately Isolated (01/21/2023)  Stress: No Stress Concern Present (01/21/2023)  Tobacco Use: Medium Risk (02/05/2023)    Readmission Risk Interventions     No data to display

## 2023-02-09 NOTE — Progress Notes (Signed)
Mobility Specialist Progress Note;   02/09/23 1140  Mobility  Activity Transferred from bed to chair  Level of Assistance +2 (takes two people) (ModA)  Assistive Device Other (Comment) (HHA)  Distance Ambulated (ft) 3 ft  Activity Response Tolerated well  Mobility Referral Yes  Mobility visit 1 Mobility  Mobility Specialist Start Time (ACUTE ONLY) 1140  Mobility Specialist Stop Time (ACUTE ONLY) 1150  Mobility Specialist Time Calculation (min) (ACUTE ONLY) 10 min   Pt agreeable to mobility. Required ModA +2 for all mobility and to transfer pt safely from bed to chair. Bed soiled upon standing, requiring linen change. Pt displayed some cognitive deficits per RN since this morning, requiring verbal cues for safety. VSS throughout and no c/o when asked. Pt left in chair with all needs met. RN and sister in room.   Caesar Bookman Mobility Specialist Please contact via SecureChat or [employer] [phone]

## 2023-02-09 NOTE — Care Management Important Message (Signed)
Important Message  Patient Details  Name: [name] MRN: [id number] Date of Birth: November 17, 1945   Important Message Given:  Yes - Medicare IM     [name] 02/09/2023, 10:57 AM

## 2023-02-10 ENCOUNTER — Inpatient Hospital Stay (HOSPITAL_COMMUNITY): Payer: PPO

## 2023-02-10 DIAGNOSIS — R413 Other amnesia: Secondary | ICD-10-CM | POA: Diagnosis not present

## 2023-02-10 DIAGNOSIS — N179 Acute kidney failure, unspecified: Secondary | ICD-10-CM | POA: Diagnosis not present

## 2023-02-10 DIAGNOSIS — I214 Non-ST elevation (NSTEMI) myocardial infarction: Secondary | ICD-10-CM | POA: Diagnosis not present

## 2023-02-10 LAB — COMPREHENSIVE METABOLIC PANEL
ALT: 78 U/L — ABNORMAL HIGH (ref 0–44)
AST: 155 U/L — ABNORMAL HIGH (ref 15–41)
Albumin: 1.9 g/dL — ABNORMAL LOW (ref 3.5–5.0)
Alkaline Phosphatase: 60 U/L (ref 38–126)
Anion gap: 11 (ref 5–15)
BUN: 22 mg/dL (ref 8–23)
CO2: 22 mmol/L (ref 22–32)
Calcium: 8.7 mg/dL — ABNORMAL LOW (ref 8.9–10.3)
Chloride: 106 mmol/L (ref 98–111)
Creatinine, Ser: 1.03 mg/dL (ref 0.61–1.24)
GFR, Estimated: >60 mL/min (ref >60)
Glucose, Bld: 95 mg/dL (ref 70–99)
Potassium: 3.7 mmol/L (ref 3.5–5.1)
Sodium: 139 mmol/L (ref 135–145)
Total Bilirubin: 0.5 mg/dL (ref 0.0–1.2)
Total Protein: 5 g/dL — ABNORMAL LOW (ref 6.5–8.1)

## 2023-02-10 LAB — CBC WITH DIFFERENTIAL/PLATELET
Abs Immature Granulocytes: 0.26 10*3/uL — ABNORMAL HIGH (ref 0.00–0.07)
Basophils Absolute: 0.1 10*3/uL (ref 0.0–0.1)
Basophils Relative: 0 %
Eosinophils Absolute: 0 10*3/uL (ref 0.0–0.5)
Eosinophils Relative: 0 %
HCT: 28.8 % — ABNORMAL LOW (ref 39.0–52.0)
Hemoglobin: 9.9 g/dL — ABNORMAL LOW (ref 13.0–17.0)
Immature Granulocytes: 1 %
Lymphocytes Relative: 7 %
Lymphs Abs: 1.3 10*3/uL (ref 0.7–4.0)
MCH: 34.5 pg — ABNORMAL HIGH (ref 26.0–34.0)
MCHC: 34.4 g/dL (ref 30.0–36.0)
MCV: 100.3 fL — ABNORMAL HIGH (ref 80.0–100.0)
Monocytes Absolute: 1.3 10*3/uL — ABNORMAL HIGH (ref 0.1–1.0)
Monocytes Relative: 7 %
Neutro Abs: 16.1 10*3/uL — ABNORMAL HIGH (ref 1.7–7.7)
Neutrophils Relative %: 85 %
Platelets: 168 10*3/uL (ref 150–400)
RBC: 2.87 MIL/uL — ABNORMAL LOW (ref 4.22–5.81)
RDW: 14.4 % (ref 11.5–15.5)
WBC: 19.1 10*3/uL — ABNORMAL HIGH (ref 4.0–10.5)
nRBC: 0 % (ref 0.0–0.2)

## 2023-02-10 LAB — C-REACTIVE PROTEIN: CRP: 9.8 mg/dL — ABNORMAL HIGH (ref <1.0)

## 2023-02-10 LAB — CULTURE, BLOOD (ROUTINE X 2)
Culture: NO GROWTH
Culture: NO GROWTH

## 2023-02-10 LAB — SEDIMENTATION RATE: Sed Rate: 58 mm/h — ABNORMAL HIGH (ref 0–16)

## 2023-02-10 LAB — CK: Total CK: 764 U/L — ABNORMAL HIGH (ref 49–397)

## 2023-02-10 LAB — URIC ACID: Uric Acid, Serum: 5.2 mg/dL (ref 3.7–8.6)

## 2023-02-10 MED ORDER — OXYCODONE HCL 5 MG PO TABS: 2.5000 mg | ORAL_TABLET | ORAL | Status: DC | PRN

## 2023-02-10 NOTE — Progress Notes (Addendum)
Patient is complaining about numbness and the pain of the left foot and calf.  Stated that it never had happened before.  Obtaining x-ray of the left ankle.   -X-ray of the ankle showed lateral swelling of the left foot.  No fracture or dislocation.  Patient's RN reported that patient has a left foot blister and there dressing in place for chronic blister. Will check uric acid, CRP and ESR. Continue leg elevation, ice pack and weightbearing as tolerated.   Tereasa Coop, MD [facility] 02/10/2023, 1:59 AM

## 2023-02-10 NOTE — Plan of Care (Signed)
  Problem: Education: Goal: Knowledge of General Education information will improve Description: Including pain rating scale, medication(s)/side effects and non-pharmacologic comfort measures Outcome: Progressing   Problem: Health Behavior/Discharge Planning: Goal: Ability to manage health-related needs will improve Outcome: Progressing   Problem: Clinical Measurements: Goal: Ability to maintain clinical measurements within normal limits will improve Outcome: Progressing Goal: Will remain free from infection Outcome: Progressing Goal: Diagnostic test results will improve Outcome: Progressing Goal: Respiratory complications will improve Outcome: Progressing Goal: Cardiovascular complication will be avoided Outcome: Progressing   Problem: Activity: Goal: Risk for activity intolerance will decrease Outcome: Progressing   Problem: Nutrition: Goal: Adequate nutrition will be maintained Outcome: Progressing   Problem: Coping: Goal: Level of anxiety will decrease Outcome: Progressing   Problem: Elimination: Goal: Will not experience complications related to bowel motility Outcome: Progressing Goal: Will not experience complications related to urinary retention Outcome: Progressing   Problem: Pain Managment: Goal: General experience of comfort will improve and/or be controlled Outcome: Progressing   Problem: Safety: Goal: Ability to remain free from injury will improve Outcome: Progressing   Problem: Skin Integrity: Goal: Risk for impaired skin integrity will decrease Outcome: Progressing   Problem: Education: Goal: Knowledge of disease and its progression will improve Outcome: Progressing   Problem: Health Behavior/Discharge Planning: Goal: Ability to manage health-related needs will improve Outcome: Progressing   Problem: Respiratory: Goal: Respiratory symptoms related to disease process will be avoided Outcome: Progressing   Problem: Urinary  Elimination: Goal: Progression of disease will be identified and treated Outcome: Progressing

## 2023-02-10 NOTE — Progress Notes (Signed)
During mobility, patient complained of numbness in is left foot and pain in his left ankle and the top of th foot. Patient states this has never happened to him before.

## 2023-02-10 NOTE — Progress Notes (Signed)
Progress Note    [name]   WUJ:[id number]  DOB: 1945-10-07  DOA: 02/05/2023     5 PCP: Malva Limes, MD  Initial CC: weakness   Hospital Course: [name] is a 78 y.o. male with PMH of COPD, bipolar disorder, CVA, peripheral neuropathy, dementia, BPH, lung cancer s/p resection who was recently admitted hospital from 11/17 to 11/30/2022 due to pneumonia presented this time to the hospital with fall and weakness with difficulty ambulation and backache.  In the ED patient had stable vitals.  Lab showed creatinine elevation at 3.1 with AST elevation at  198. CK 7533, troponin was 02/14/2005.  Procalcitonin elevated at 6.2.  WBC elevated at 34 K. EKG atrial flutter HR 64 controlled.  CT scan of the head and CT C-spine negative.  CT of the chest was negative for PE, no aortic dissection.  Multifocal pneumonia and left lower lobe opacity possible reactivation of lung cancer.  Recommend PET scan as an outpatient  Non-STEMI, most likely demand ischemia EKG did not show any ischemic changes.  With significant troponin elevation but trending down.  On aspirin. Cardiology consulted and recommend outpatient stress test.  Atrial flutter, paroxysmal, currently in sinus rhythm - continue Eliquis    Sepsis due to Multifocal pneumonia Was tachypneic with leukocytosis and imaging showed multifocal pneumonia.  Procalcitonin was significantly elevated at 6.2.  Respiratory viral panel was positive for respiratory syncytial virus.  Has been empirically treated with abx.    RSV positive Symptomatic care.   AKI with metabolic acidosis most likely due to rhabdomyolysis S/p bicarb infusion.   - slowly improving   PAD - severe B/L LE PAD noted on CTA - discussed with his sisters; they wish to see how the does on Eliquis but understand he's likely a poor invasive candidate   Rhabdomyolysis CK level 7533 on presentation.  S/p IV fluids. continue to hold statin for now. - trending down    History of bipolar disorder, dementia and anxiety Continue Geodon 40 mg p.o. nightly and Depakote 1000 mg at bedtime   COPD, resumed Incruse Ellipta home inhaler continue DuoNebs as needed   History of lung cancer, history of resection in 2020. CTA chest shows possible recurrence of lung cancer Recommend to follow with oncology as an outpatient to repeat PET scan for further management.   BPH, not on any medication.  Check bladder scan if needed.   GERD continue PPI  Interval History:  No events overnight.  Awaiting insurance auth and facility to be able to accept him. He's medically stable for discharge.    Old records reviewed in assessment of this patient  Antimicrobials:   DVT prophylaxis:   apixaban (ELIQUIS) tablet 5 mg   Code Status:   Code Status: Do not attempt resuscitation (DNR) PRE-ARREST INTERVENTIONS DESIRED  Mobility Assessment (Last 72 Hours)     Mobility Assessment     Row Name 02/10/23 0815 02/09/23 2056 02/09/23 0836 02/09/23 0638 02/08/23 1941   Does patient have an order for bedrest or is patient medically unstable No - Continue assessment No - Continue assessment No - Continue assessment No - Continue assessment No - Continue assessment   What is the highest level of mobility based on the progressive mobility assessment? Level 5 (Walks with assist in room/hall) - Balance while stepping forward/back and can walk in room with assist - Complete Level 5 (Walks with assist in room/hall) - Balance while stepping forward/back and can walk in room with assist -  Complete Level 5 (Walks with assist in room/hall) - Balance while stepping forward/back and can walk in room with assist - Complete Level 5 (Walks with assist in room/hall) - Balance while stepping forward/back and can walk in room with assist - Complete Level 5 (Walks with assist in room/hall) - Balance while stepping forward/back and can walk in room with assist - Complete   Is the above level different  from baseline mobility prior to current illness? Yes - Recommend PT order Yes - Recommend PT order Yes - Recommend PT order Yes - Recommend PT order Yes - Recommend PT order    Row Name 02/08/23 1736 02/08/23 1617 02/08/23 1600 02/08/23 1116 02/08/23 0400   Does patient have an order for bedrest or is patient medically unstable -- No - Continue assessment No - Continue assessment No - Continue assessment No - Continue assessment   What is the highest level of mobility based on the progressive mobility assessment? Level 5 (Walks with assist in room/hall) - Balance while stepping forward/back and can walk in room with assist - Complete Level 4 (Walks with assist in room) - Balance while marching in place and cannot step forward and back - Complete Level 1 (Bedfast) - Unable to balance while sitting on edge of bed Level 1 (Bedfast) - Unable to balance while sitting on edge of bed Level 2 (Chairfast) - Balance while sitting on edge of bed and cannot stand   Is the above level different from baseline mobility prior to current illness? -- -- Yes - Recommend PT order Yes - Recommend PT order Yes - Recommend PT order    Row Name 02/07/23 2141 02/07/23 1735         Does patient have an order for bedrest or is patient medically unstable No - Continue assessment --      What is the highest level of mobility based on the progressive mobility assessment? -- Level 3 (Stands with assist) - Balance while standing  and cannot march in place               Barriers to discharge: none Disposition Plan:  SNF Status is: Inpt  Objective: Blood pressure 114/71, pulse 66, temperature 97.8 F (36.6 C), temperature source Oral, resp. rate 12, height 5\' 10"  (1.778 m), weight 58.7 kg, SpO2 98%.  Examination:  Physical Exam Constitutional:      General: He is not in acute distress.    Appearance: Normal appearance.     Comments: Chronically ill-appearing adult man lying in bed in no distress with slowed mentation  and hard of hearing  HENT:     Head: Normocephalic and atraumatic.     Mouth/Throat:     Mouth: Mucous membranes are moist.  Eyes:     Extraocular Movements: Extraocular movements intact.  Cardiovascular:     Rate and Rhythm: Normal rate and regular rhythm.  Pulmonary:     Effort: Pulmonary effort is normal. No respiratory distress.     Breath sounds: Normal breath sounds. No wheezing.  Abdominal:     General: Bowel sounds are normal. There is no distension.     Palpations: Abdomen is soft.     Tenderness: There is no abdominal tenderness.  Musculoskeletal:        General: Normal range of motion.     Cervical back: Normal range of motion and neck supple.  Skin:    General: Skin is warm and dry.  Neurological:     Mental Status: He  is disoriented.     Motor: Weakness present.  Psychiatric:        Mood and Affect: Mood normal.      Consultants:    Procedures:    Data Reviewed: Results for orders placed or performed during the hospital encounter of 02/05/23 (from the past 24 hours)  CK     Status: Abnormal   Collection Time: 02/10/23  3:44 AM  Result Value Ref Range   Total CK 764 (H) 49 - 397 U/L  CBC with Differential/Platelet     Status: Abnormal   Collection Time: 02/10/23  3:44 AM  Result Value Ref Range   WBC 19.1 (H) 4.0 - 10.5 K/uL   RBC 2.87 (L) 4.22 - 5.81 MIL/uL   Hemoglobin 9.9 (L) 13.0 - 17.0 g/dL   HCT 14.7 (L) 82.9 - 56.2 %   MCV 100.3 (H) 80.0 - 100.0 fL   MCH 34.5 (H) 26.0 - 34.0 pg   MCHC 34.4 30.0 - 36.0 g/dL   RDW 13.0 86.5 - 78.4 %   Platelets 168 150 - 400 K/uL   nRBC 0.0 0.0 - 0.2 %   Neutrophils Relative % 85 %   Neutro Abs 16.1 (H) 1.7 - 7.7 K/uL   Lymphocytes Relative 7 %   Lymphs Abs 1.3 0.7 - 4.0 K/uL   Monocytes Relative 7 %   Monocytes Absolute 1.3 (H) 0.1 - 1.0 K/uL   Eosinophils Relative 0 %   Eosinophils Absolute 0.0 0.0 - 0.5 K/uL   Basophils Relative 0 %   Basophils Absolute 0.1 0.0 - 0.1 K/uL   Immature Granulocytes 1 %    Abs Immature Granulocytes 0.26 (H) 0.00 - 0.07 K/uL  Comprehensive metabolic panel     Status: Abnormal   Collection Time: 02/10/23  3:44 AM  Result Value Ref Range   Sodium 139 135 - 145 mmol/L   Potassium 3.7 3.5 - 5.1 mmol/L   Chloride 106 98 - 111 mmol/L   CO2 22 22 - 32 mmol/L   Glucose, Bld 95 70 - 99 mg/dL   BUN 22 8 - 23 mg/dL   Creatinine, Ser 6.96 0.61 - 1.24 mg/dL   Calcium 8.7 (L) 8.9 - 10.3 mg/dL   Total Protein 5.0 (L) 6.5 - 8.1 g/dL   Albumin 1.9 (L) 3.5 - 5.0 g/dL   AST 295 (H) 15 - 41 U/L   ALT 78 (H) 0 - 44 U/L   Alkaline Phosphatase 60 38 - 126 U/L   Total Bilirubin 0.5 0.0 - 1.2 mg/dL   GFR, Estimated >28 >41 mL/min   Anion gap 11 5 - 15  Uric acid     Status: None   Collection Time: 02/10/23  7:39 AM  Result Value Ref Range   Uric Acid, Serum 5.2 3.7 - 8.6 mg/dL  C-reactive protein     Status: Abnormal   Collection Time: 02/10/23  7:39 AM  Result Value Ref Range   CRP 9.8 (H) <1.0 mg/dL  Sedimentation rate     Status: Abnormal   Collection Time: 02/10/23  7:39 AM  Result Value Ref Range   Sed Rate 58 (H) 0 - 16 mm/hr    I have reviewed pertinent nursing notes, vitals, labs, and images as necessary. I have ordered labwork to follow up on as indicated.  I have reviewed the last notes from staff over past 24 hours. I have discussed patient's care plan and test results with nursing staff, CM/SW, and other staff as appropriate.  LOS: 5 days   Lewie Chamber, MD [facility] 02/10/2023, 4:20 PM

## 2023-02-11 DIAGNOSIS — N179 Acute kidney failure, unspecified: Secondary | ICD-10-CM | POA: Diagnosis not present

## 2023-02-11 DIAGNOSIS — R413 Other amnesia: Secondary | ICD-10-CM | POA: Diagnosis not present

## 2023-02-11 DIAGNOSIS — I214 Non-ST elevation (NSTEMI) myocardial infarction: Secondary | ICD-10-CM | POA: Diagnosis not present

## 2023-02-11 DIAGNOSIS — J121 Respiratory syncytial virus pneumonia: Secondary | ICD-10-CM | POA: Diagnosis not present

## 2023-02-11 NOTE — Plan of Care (Signed)

## 2023-02-11 NOTE — Progress Notes (Signed)
Progress Note    [name]   WUJ:[id number]  DOB: [birth year]  DOA: 02/05/2023     6 PCP: Malva Limes, MD  Initial CC: weakness   Hospital Course: [name] is a 78 y.o. male with PMH of COPD, bipolar disorder, CVA, peripheral neuropathy, dementia, BPH, lung cancer s/p resection who was recently admitted hospital from 11/17 to 11/30/2022 due to pneumonia presented this time to the hospital with fall and weakness with difficulty ambulation and backache.  In the ED patient had stable vitals.  Lab showed creatinine elevation at 3.1 with AST elevation at  198. CK 7533, troponin was 02/14/2005.  Procalcitonin elevated at 6.2.  WBC elevated at 34 K. EKG atrial flutter HR 64 controlled.  CT scan of the head and CT C-spine negative.  CT of the chest was negative for PE, no aortic dissection.  Multifocal pneumonia and left lower lobe opacity possible reactivation of lung cancer.  Recommend PET scan as an outpatient  Non-STEMI, most likely demand ischemia EKG did not show any ischemic changes.  With significant troponin elevation but trending down.  On aspirin. Cardiology consulted and recommend outpatient stress test.  Atrial flutter, paroxysmal, currently in sinus rhythm - continue Eliquis    Sepsis due to Multifocal pneumonia Was tachypneic with leukocytosis and imaging showed multifocal pneumonia.  Procalcitonin was significantly elevated at 6.2.  Respiratory viral panel was positive for respiratory syncytial virus.  Has been empirically treated with abx.    RSV positive Symptomatic care.   AKI with metabolic acidosis most likely due to rhabdomyolysis S/p bicarb infusion.   - slowly improving   PAD - severe B/L LE PAD noted on CTA - discussed with his sisters; they wish to see how the does on Eliquis but understand he's likely a poor invasive candidate   Rhabdomyolysis CK level 7533 on presentation.  S/p IV fluids. continue to hold statin for now. - trending down    History of bipolar disorder, dementia and anxiety Continue Geodon 40 mg p.o. nightly and Depakote 1000 mg at bedtime   COPD, resumed Incruse Ellipta home inhaler continue DuoNebs as needed   History of lung cancer, history of resection in 2020. CTA chest shows possible recurrence of lung cancer Recommend to follow with oncology as an outpatient to repeat PET scan for further management.   BPH, not on any medication.  Check bladder scan if needed.   GERD continue PPI  Interval History:  No events overnight.  Awaiting insurance auth and facility to be able to accept him. He's medically stable for discharge.  D/c to SNF on Monday.    Old records reviewed in assessment of this patient  Antimicrobials:   DVT prophylaxis:   apixaban (ELIQUIS) tablet 5 mg   Code Status:   Code Status: Do not attempt resuscitation (DNR) PRE-ARREST INTERVENTIONS DESIRED  Mobility Assessment (Last 72 Hours)     Mobility Assessment     Row Name 02/11/23 0715 02/10/23 2030 02/10/23 0815 02/09/23 2056 02/09/23 0836   Does patient have an order for bedrest or is patient medically unstable No - Continue assessment No - Continue assessment No - Continue assessment No - Continue assessment No - Continue assessment   What is the highest level of mobility based on the progressive mobility assessment? Level 5 (Walks with assist in room/hall) - Balance while stepping forward/back and can walk in room with assist - Complete Level 5 (Walks with assist in room/hall) - Balance while stepping forward/back and  can walk in room with assist - Complete Level 5 (Walks with assist in room/hall) - Balance while stepping forward/back and can walk in room with assist - Complete Level 5 (Walks with assist in room/hall) - Balance while stepping forward/back and can walk in room with assist - Complete Level 5 (Walks with assist in room/hall) - Balance while stepping forward/back and can walk in room with assist - Complete   Is the  above level different from baseline mobility prior to current illness? Yes - Recommend PT order Yes - Recommend PT order Yes - Recommend PT order Yes - Recommend PT order Yes - Recommend PT order    Row Name 02/09/23 1610 02/08/23 1941 02/08/23 1736 02/08/23 1617 02/08/23 1600   Does patient have an order for bedrest or is patient medically unstable No - Continue assessment No - Continue assessment -- No - Continue assessment No - Continue assessment   What is the highest level of mobility based on the progressive mobility assessment? Level 5 (Walks with assist in room/hall) - Balance while stepping forward/back and can walk in room with assist - Complete Level 5 (Walks with assist in room/hall) - Balance while stepping forward/back and can walk in room with assist - Complete Level 5 (Walks with assist in room/hall) - Balance while stepping forward/back and can walk in room with assist - Complete Level 4 (Walks with assist in room) - Balance while marching in place and cannot step forward and back - Complete Level 1 (Bedfast) - Unable to balance while sitting on edge of bed   Is the above level different from baseline mobility prior to current illness? Yes - Recommend PT order Yes - Recommend PT order -- -- Yes - Recommend PT order            Barriers to discharge: none Disposition Plan:  SNF Status is: Inpt  Objective: Blood pressure 136/72, pulse 66, temperature 97.7 F (36.5 C), temperature source Oral, resp. rate 18, height 5\' 10"  (1.778 m), weight 57.2 kg, SpO2 100%.  Examination:  Physical Exam Constitutional:      General: He is not in acute distress.    Appearance: Normal appearance.     Comments: Chronically ill-appearing adult man lying in bed in no distress with slowed mentation and hard of hearing  HENT:     Head: Normocephalic and atraumatic.     Mouth/Throat:     Mouth: Mucous membranes are moist.  Eyes:     Extraocular Movements: Extraocular movements intact.   Cardiovascular:     Rate and Rhythm: Normal rate and regular rhythm.  Pulmonary:     Effort: Pulmonary effort is normal. No respiratory distress.     Breath sounds: Normal breath sounds. No wheezing.  Abdominal:     General: Bowel sounds are normal. There is no distension.     Palpations: Abdomen is soft.     Tenderness: There is no abdominal tenderness.  Musculoskeletal:        General: Normal range of motion.     Cervical back: Normal range of motion and neck supple.  Skin:    General: Skin is warm and dry.  Neurological:     Mental Status: He is disoriented.     Motor: Weakness present.  Psychiatric:        Mood and Affect: Mood normal.      Consultants:    Procedures:    Data Reviewed: No results found for this or any previous visit (from the  past 24 hours).   I have reviewed pertinent nursing notes, vitals, labs, and images as necessary. I have ordered labwork to follow up on as indicated.  I have reviewed the last notes from staff over past 24 hours. I have discussed patient's care plan and test results with nursing staff, CM/SW, and other staff as appropriate.     LOS: 6 days   Lewie Chamber, MD [facility] 02/11/2023, 1:39 PM

## 2023-02-12 ENCOUNTER — Encounter (HOSPITAL_COMMUNITY): Payer: Self-pay | Admitting: Student

## 2023-02-12 DIAGNOSIS — Z85118 Personal history of other malignant neoplasm of bronchus and lung: Secondary | ICD-10-CM | POA: Diagnosis not present

## 2023-02-12 DIAGNOSIS — M6282 Rhabdomyolysis: Secondary | ICD-10-CM | POA: Diagnosis not present

## 2023-02-12 DIAGNOSIS — Z23 Encounter for immunization: Secondary | ICD-10-CM | POA: Diagnosis present

## 2023-02-12 DIAGNOSIS — I639 Cerebral infarction, unspecified: Secondary | ICD-10-CM | POA: Diagnosis not present

## 2023-02-12 DIAGNOSIS — N400 Enlarged prostate without lower urinary tract symptoms: Secondary | ICD-10-CM | POA: Diagnosis not present

## 2023-02-12 DIAGNOSIS — Z7901 Long term (current) use of anticoagulants: Secondary | ICD-10-CM | POA: Diagnosis not present

## 2023-02-12 DIAGNOSIS — J189 Pneumonia, unspecified organism: Secondary | ICD-10-CM | POA: Diagnosis not present

## 2023-02-12 DIAGNOSIS — I214 Non-ST elevation (NSTEMI) myocardial infarction: Secondary | ICD-10-CM | POA: Diagnosis not present

## 2023-02-12 DIAGNOSIS — F0390 Unspecified dementia without behavioral disturbance: Secondary | ICD-10-CM | POA: Diagnosis not present

## 2023-02-12 DIAGNOSIS — L97421 Non-pressure chronic ulcer of left heel and midfoot limited to breakdown of skin: Secondary | ICD-10-CM | POA: Diagnosis not present

## 2023-02-12 DIAGNOSIS — I480 Paroxysmal atrial fibrillation: Secondary | ICD-10-CM | POA: Diagnosis not present

## 2023-02-12 DIAGNOSIS — F03A4 Unspecified dementia, mild, with anxiety: Secondary | ICD-10-CM | POA: Diagnosis not present

## 2023-02-12 DIAGNOSIS — Y92009 Unspecified place in unspecified non-institutional (private) residence as the place of occurrence of the external cause: Secondary | ICD-10-CM | POA: Diagnosis not present

## 2023-02-12 DIAGNOSIS — L97422 Non-pressure chronic ulcer of left heel and midfoot with fat layer exposed: Secondary | ICD-10-CM | POA: Diagnosis not present

## 2023-02-12 DIAGNOSIS — Z743 Need for continuous supervision: Secondary | ICD-10-CM | POA: Diagnosis not present

## 2023-02-12 DIAGNOSIS — E43 Unspecified severe protein-calorie malnutrition: Secondary | ICD-10-CM | POA: Diagnosis not present

## 2023-02-12 DIAGNOSIS — R531 Weakness: Secondary | ICD-10-CM | POA: Diagnosis not present

## 2023-02-12 DIAGNOSIS — R413 Other amnesia: Secondary | ICD-10-CM | POA: Diagnosis not present

## 2023-02-12 DIAGNOSIS — E559 Vitamin D deficiency, unspecified: Secondary | ICD-10-CM | POA: Diagnosis not present

## 2023-02-12 DIAGNOSIS — I4892 Unspecified atrial flutter: Secondary | ICD-10-CM | POA: Diagnosis not present

## 2023-02-12 DIAGNOSIS — E7800 Pure hypercholesterolemia, unspecified: Secondary | ICD-10-CM | POA: Diagnosis not present

## 2023-02-12 DIAGNOSIS — I70244 Atherosclerosis of native arteries of left leg with ulceration of heel and midfoot: Secondary | ICD-10-CM | POA: Diagnosis not present

## 2023-02-12 DIAGNOSIS — H353231 Exudative age-related macular degeneration, bilateral, with active choroidal neovascularization: Secondary | ICD-10-CM | POA: Diagnosis not present

## 2023-02-12 DIAGNOSIS — F419 Anxiety disorder, unspecified: Secondary | ICD-10-CM | POA: Diagnosis not present

## 2023-02-12 DIAGNOSIS — N182 Chronic kidney disease, stage 2 (mild): Secondary | ICD-10-CM | POA: Diagnosis not present

## 2023-02-12 DIAGNOSIS — F319 Bipolar disorder, unspecified: Secondary | ICD-10-CM | POA: Diagnosis not present

## 2023-02-12 DIAGNOSIS — B338 Other specified viral diseases: Secondary | ICD-10-CM | POA: Diagnosis not present

## 2023-02-12 DIAGNOSIS — E538 Deficiency of other specified B group vitamins: Secondary | ICD-10-CM | POA: Diagnosis not present

## 2023-02-12 DIAGNOSIS — J121 Respiratory syncytial virus pneumonia: Secondary | ICD-10-CM | POA: Diagnosis not present

## 2023-02-12 DIAGNOSIS — J449 Chronic obstructive pulmonary disease, unspecified: Secondary | ICD-10-CM | POA: Diagnosis not present

## 2023-02-12 DIAGNOSIS — L89013 Pressure ulcer of right elbow, stage 3: Secondary | ICD-10-CM | POA: Diagnosis not present

## 2023-02-12 DIAGNOSIS — E872 Acidosis, unspecified: Secondary | ICD-10-CM | POA: Diagnosis not present

## 2023-02-12 DIAGNOSIS — W1830XA Fall on same level, unspecified, initial encounter: Secondary | ICD-10-CM | POA: Diagnosis not present

## 2023-02-12 DIAGNOSIS — K219 Gastro-esophageal reflux disease without esophagitis: Secondary | ICD-10-CM | POA: Diagnosis not present

## 2023-02-12 DIAGNOSIS — L89213 Pressure ulcer of right hip, stage 3: Secondary | ICD-10-CM | POA: Diagnosis not present

## 2023-02-12 DIAGNOSIS — N179 Acute kidney failure, unspecified: Secondary | ICD-10-CM | POA: Diagnosis not present

## 2023-02-12 DIAGNOSIS — L97321 Non-pressure chronic ulcer of left ankle limited to breakdown of skin: Secondary | ICD-10-CM | POA: Diagnosis not present

## 2023-02-12 LAB — COMPREHENSIVE METABOLIC PANEL
ALT: 58 U/L — ABNORMAL HIGH (ref 0–44)
AST: 70 U/L — ABNORMAL HIGH (ref 15–41)
Albumin: 2.2 g/dL — ABNORMAL LOW (ref 3.5–5.0)
Alkaline Phosphatase: 71 U/L (ref 38–126)
Anion gap: 8 (ref 5–15)
BUN: 24 mg/dL — ABNORMAL HIGH (ref 8–23)
CO2: 25 mmol/L (ref 22–32)
Calcium: 8.9 mg/dL (ref 8.9–10.3)
Chloride: 105 mmol/L (ref 98–111)
Creatinine, Ser: 1.17 mg/dL (ref 0.61–1.24)
GFR, Estimated: >60 mL/min (ref >60)
Glucose, Bld: 92 mg/dL (ref 70–99)
Potassium: 3.9 mmol/L (ref 3.5–5.1)
Sodium: 138 mmol/L (ref 135–145)
Total Bilirubin: 0.3 mg/dL (ref 0.0–1.2)
Total Protein: 5.6 g/dL — ABNORMAL LOW (ref 6.5–8.1)

## 2023-02-12 LAB — CBC
HCT: 32.5 % — ABNORMAL LOW (ref 39.0–52.0)
Hemoglobin: 10.9 g/dL — ABNORMAL LOW (ref 13.0–17.0)
MCH: 33.7 pg (ref 26.0–34.0)
MCHC: 33.5 g/dL (ref 30.0–36.0)
MCV: 100.6 fL — ABNORMAL HIGH (ref 80.0–100.0)
Platelets: 258 10*3/uL (ref 150–400)
RBC: 3.23 MIL/uL — ABNORMAL LOW (ref 4.22–5.81)
RDW: 14.4 % (ref 11.5–15.5)
WBC: 20.6 10*3/uL — ABNORMAL HIGH (ref 4.0–10.5)
nRBC: 0 % (ref 0.0–0.2)

## 2023-02-12 MED ORDER — APIXABAN 5 MG PO TABS: 5.0000 mg | ORAL_TABLET | Freq: Two times a day (BID) | ORAL | Status: DC

## 2023-02-12 MED ORDER — SODIUM CHLORIDE 0.9 % IV BOLUS
500.0000 mL | Freq: Once | INTRAVENOUS | Status: AC
  Administered 2023-02-12: 500 mL via INTRAVENOUS

## 2023-02-12 NOTE — Progress Notes (Addendum)
Physical Therapy Treatment Patient Details Name: [name] MRN: [id number] DOB: 10/12/1945 Today's Date: 02/12/2023   History of Present Illness [name] is a 78 y.o. male admitted 02/05/23 with weakness and fall at home.  Found to have NSTEMI, multifocal pneumonia, RSV, AKI, Rhabdomyolysis and metabolic acidosis.  PMH significant for bipolar disorder, BPH, depression, GERD, COPD, peripheral neuropathy, dyslipidemia, dementia, history of lung cancer status post surgery and lumbar fusion    PT Comments  The pt was agreeable to session with focus on progressing mobility, but was limited by 3/4 DOE, pt fatigue, and soft BP. Pt able to complete bed mobility with less assistance this morning, but continues to need modA and max multimodal cues to complete pivotal steps and manage RW. Ambulation limited by fatigue, increased SOB, and poor adherence to cues for safety, use of RW, and posture.   HR: 89-120bpm BP: 80s/60-70s   If plan is discharge home, recommend the following: A lot of help with walking and/or transfers;A lot of help with bathing/dressing/bathroom;Assistance with cooking/housework;Assist for transportation;Help with stairs or ramp for entrance   Can travel by private vehicle     No  Equipment Recommendations  Other (comment) (defer to next venue of care)    Recommendations for Other Services       Precautions / Restrictions Precautions Precautions: Fall Precaution Comments: soft BP Restrictions Weight Bearing Restrictions Per Provider Order: No     Mobility  Bed Mobility Overal bed mobility: Needs Assistance Bed Mobility: Supine to Sit     Supine to sit: Used rails, HOB elevated, Min assist     General bed mobility comments: minA wiht increased time, pt using rail but also reaching for PTto pull trunk to elevate. able to scoot without assistance to reposition at EOB    Transfers Overall transfer level: Needs assistance Equipment used: Rolling walker  (2 wheels) Transfers: Sit to/from Stand Sit to Stand: Mod assist   Step pivot transfers: Mod assist       General transfer comment: modA for initial stand due to posterior lean. pt able to steady in stance then cued to step to chair. pt pushing RW further away, unable to reposition despite max cues. very small pivotal steps with modA to steady    Ambulation/Gait Ambulation/Gait assistance: Mod assist, +2 safety/equipment Gait Distance (Feet): 3 Feet Assistive device: Rolling walker (2 wheels) Gait Pattern/deviations: Step-to pattern, Shuffle, Decreased stride length, Trunk flexed Gait velocity: reduced     General Gait Details: PT ambulated with short, shuffling, step-to pattern. Increased truncal flexion despite cues and poor position in RW. limited by fatige and pt with 3/4 DOE. SpO2 92% with good reading. BP soft but stable      Balance Overall balance assessment: Needs assistance Sitting-balance support: No upper extremity supported, Feet supported, Single extremity supported Sitting balance-Leahy Scale: Fair Sitting balance - Comments: Mod anterior lean and resisted correction. No LOB. Able to reach off BOS better to his L than R. Postural control: Left lateral lean Standing balance support: Reliant on assistive device for balance, Bilateral upper extremity supported, During functional activity Standing balance-Leahy Scale: Poor Standing balance comment: Pt reliant on RW. Stood with increased forward trunk flexion. No LOB                            Cognition Arousal: Alert Behavior During Therapy: Flat affect Overall Cognitive Status: No family/caregiver present to determine baseline cognitive functioning  General Comments: Pt not oriented to situation. Slow processing, impaired command following (followed one step commands with tactile hand over hand cueing providing the most benefit), hard of hearing, garbled and  low speech.        Exercises      General Comments General comments (skin integrity, edema, etc.): Bp soft (80s/60-70) throughout, SpO2 >90% with good reading but pt with 3/4 DOE. bandage on L heel      Pertinent Vitals/Pain Pain Assessment Pain Assessment: No/denies pain Faces Pain Scale: No hurt Pain Intervention(s): Limited activity within patient's tolerance, Monitored during session, Repositioned     PT Goals (current goals can now be found in the care plan section) Acute Rehab PT Goals Patient Stated Goal: get strength back PT Goal Formulation: With patient Time For Goal Achievement: 02/20/23 Potential to Achieve Goals: Fair Progress towards PT goals: Progressing toward goals    Frequency    Min 1X/week       AM-PAC PT "6 Clicks" Mobility   Outcome Measure  Help needed turning from your back to your side while in a flat bed without using bedrails?: A Little Help needed moving from lying on your back to sitting on the side of a flat bed without using bedrails?: A Lot Help needed moving to and from a bed to a chair (including a wheelchair)?: A Lot Help needed standing up from a chair using your arms (e.g., wheelchair or bedside chair)?: A Little Help needed to walk in hospital room?: A Lot Help needed climbing 3-5 steps with a railing? : Total 6 Click Score: 13    End of Session Equipment Utilized During Treatment: Gait belt Activity Tolerance: Patient limited by fatigue Patient left: in chair;with call bell/phone within reach;with chair alarm set Nurse Communication: Mobility status PT Visit Diagnosis: Muscle weakness (generalized) (M62.81);Other abnormalities of gait and mobility (R26.89);History of falling (Z91.81)     Time: 5784-6962 PT Time Calculation (min) (ACUTE ONLY): 38 min  Charges:    $Therapeutic Exercise: 8-22 mins $Therapeutic Activity: 8-22 mins PT General Charges $$ ACUTE PT VISIT: 1 Visit                     Vickki Muff, PT, DPT    Acute Rehabilitation Department Office [phone] Secure Chat Communication Preferred   Ronnie Derby 02/12/2023, 11:56 AM

## 2023-02-12 NOTE — TOC Progression Note (Addendum)
Transition of Care [facility]) - Progression Note    Patient Details  Name: [name] MRN: [id number] Date of Birth: 1945-12-28  [facility] CM/SW Contact  Delilah Shan, LCSWA Phone Number: 02/12/2023, 9:58 AM  Clinical Narrative:     CSW spoke with Tammy with [facility] who informed CSW that patients insurance authorization for SNF has been approved [phone]. PTAR has been approved # [id number]. MD informed CSW patient medically stable for dc. CSW awaiting call back from D[place]re with [facility] to see if they have ben availability today for patient. CSW will continue to follow.   Patients passr was approved [id number] E. Expected Discharge Plan: Skilled Nursing Facility Barriers to Discharge: Continued Medical Work up  Expected Discharge Plan and Services In-house Referral: Clinical Social Work     Living arrangements for the past 2 months: Single Family Home                                       Social Determinants of Health (SDOH) Interventions SDOH Screenings   Food Insecurity: No Food Insecurity (02/06/2023)  Housing: Low Risk  (02/06/2023)  Transportation Needs: No Transportation Needs (02/06/2023)  Utilities: Not At Risk (02/06/2023)  Alcohol Screen: Low Risk  (06/14/2022)  Depression (PHQ2-9): Low Risk  (01/19/2023)  Financial Resource Strain: Low Risk  (01/21/2023)  Physical Activity: Unknown (01/21/2023)  Social Connections: Moderately Integrated (02/06/2023)  Recent Concern: Social Connections - Moderately Isolated (01/21/2023)  Stress: No Stress Concern Present (01/21/2023)  Tobacco Use: Medium Risk (02/05/2023)    Readmission Risk Interventions     No data to display

## 2023-02-12 NOTE — Progress Notes (Signed)
Discharge instructions (including medications) discussed with receiving facility nurse, Victorino Dike, and copy provided in discharge packet sent to [facility] facility.

## 2023-02-12 NOTE — TOC Transition Note (Signed)
Transition of Care [facility]) - Discharge Note   Patient Details  Name: [name] MRN: [id number] Date of Birth: [birth year]  [facility] CM/SW Contact:  Delilah Shan, LCSWA Phone Number: 02/12/2023, 1:21 PM   Clinical Narrative:     Patient will DC to: [facility]   Anticipated DC date: 02/12/2023  Family notified: Danella Sensing  Transport by: Sharin Mons  ?  Per MD patient ready for DC to [facility] . RN, patient, patient's family, and facility notified of DC. Discharge Summary sent to facility. RN given number for report [phone] RM# 601. DC packet on chart.DNR signed by MD attached to patients DC packet. Ambulance transport requested for patient.  CSW signing off.   Final next level of care: Skilled Nursing Facility Barriers to Discharge: No Barriers Identified   Patient Goals and CMS Choice Patient states their goals for this hospitalization and ongoing recovery are:: SNF   Choice offered to / list presented to : Sibling      Discharge Placement              Patient chooses bed at: [facility] Patient to be transferred to facility by: [facility] Name of family member notified: Danella Sensing Patient and family notified of of transfer: 02/12/23  Discharge Plan and Services Additional resources added to the After Visit Summary for   In-house Referral: Clinical Social Work                                   Social Drivers of Health (SDOH) Interventions SDOH Screenings   Food Insecurity: No Food Insecurity (02/06/2023)  Housing: Low Risk  (02/06/2023)  Transportation Needs: No Transportation Needs (02/06/2023)  Utilities: Not At Risk (02/06/2023)  Alcohol Screen: Low Risk  (06/14/2022)  Depression (PHQ2-9): Low Risk  (01/19/2023)  Financial Resource Strain: Low Risk  (01/21/2023)  Physical Activity: Unknown (01/21/2023)  Social Connections: Moderately Integrated (02/06/2023)  Recent Concern: Social Connections - Moderately Isolated  (01/21/2023)  Stress: No Stress Concern Present (01/21/2023)  Tobacco Use: Medium Risk (02/05/2023)     Readmission Risk Interventions     No data to display

## 2023-02-12 NOTE — Care Management Important Message (Signed)
Important Message  Patient Details  Name: [name] MRN: [id number] Date of Birth: 15-Dec-1945   Important Message Given:  Yes - Medicare IM     [name] 02/12/2023, 10:26 AM

## 2023-02-12 NOTE — Progress Notes (Signed)
Sister, [place], called and updated on pt status.

## 2023-02-12 NOTE — Progress Notes (Addendum)
   02/12/23 1226  Vitals  BP (!) 86/55  MAP (mmHg) 66  BP Location Left Arm  BP Method Automatic  Patient Position (if appropriate) Sitting   MD, Lewie Chamber, notified of soft BP.

## 2023-02-12 NOTE — Discharge Summary (Addendum)
Physician Discharge Summary   [name]:[id number] DOB: [birth year] DOA: 02/05/2023  PCP: Malva Limes, MD  Admit date: 02/05/2023 Discharge date: 02/12/2023  Admitted From: Home Disposition:  [facility] Discharging physician: Lewie Chamber, MD Barriers to discharge: awaiting SNF  Recommendations at discharge: Consider referral to palliative care if patient declines further after rehab trial Follow up outpatient with oncology due to abnormal CTA chest    Discharge Condition: stable CODE STATUS: DNR Diet recommendation:  Diet Orders (From admission, onward)     Start     Ordered   02/12/23 0000  Diet general        02/12/23 1232   02/05/23 2001  Diet regular Room service appropriate? Yes; Fluid consistency: Thin  Diet effective now       Question Answer Comment  Room service appropriate? Yes   Fluid consistency: Thin      02/05/23 2000            Hospital Course: [name] is a 78 y.o. male with PMH of COPD, bipolar disorder, CVA, peripheral neuropathy, dementia, BPH, lung cancer s/p resection who was recently admitted hospital from 11/17 to 11/30/2022 due to pneumonia presented this time to the hospital with fall and weakness with difficulty ambulation and backache.  In the ED patient had stable vitals.  Lab showed creatinine elevation at 3.1 with AST elevation at  198. CK 7533, troponin was 02/14/2005.  Procalcitonin elevated at 6.2.  WBC elevated at 34 K. EKG atrial flutter HR 64 controlled.  CT scan of the head and CT C-spine negative.  CT of the chest was negative for PE, no aortic dissection.  Multifocal pneumonia and left lower lobe opacity possible reactivation of lung cancer.  Recommend PET scan as an outpatient  Non-STEMI, most likely demand ischemia EKG did not show any ischemic changes.  With significant troponin elevation but trending down.  On aspirin. Cardiology consulted and recommend outpatient stress test.  Atrial flutter, paroxysmal,  currently in sinus rhythm - continue Eliquis    Sepsis due to Multifocal pneumonia Was tachypneic with leukocytosis and imaging showed multifocal pneumonia.  Procalcitonin was significantly elevated at 6.2.  Respiratory viral panel was positive for respiratory syncytial virus.  Has been empirically treated with abx.    RSV positive Symptomatic care.   AKI with metabolic acidosis most likely due to rhabdomyolysis S/p bicarb infusion.   - normal renal function, creat 1.17 on 2/3  PAD - severe B/L LE PAD noted on CTA - discussed with his sisters; they wish to see how the does on Eliquis but understand he's likely a poor invasive candidate   Rhabdomyolysis CK level 7533 on presentation.  S/p IV fluids. continue to hold statin for now. - trended down - eating/drinking well   History of bipolar disorder, dementia and anxiety Continue Geodon 40 mg p.o. nightly and Depakote 1000 mg at bedtime   COPD, resumed Incruse Ellipta home inhaler continue DuoNebs as needed   History of lung cancer, history of resection in 2020. CTA chest shows possible recurrence of lung cancer Recommend to follow with oncology as an outpatient to repeat PET scan for further management.   BPH, not on any medication; voiding well    GERD continue PPI   Principal Diagnosis: NSTEMI (non-ST elevated myocardial infarction) [facility])  Discharge Diagnoses: Active Hospital Problems   Diagnosis Date Noted   NSTEMI (non-ST elevated myocardial infarction) (HCC) 02/05/2023   Memory deficit 07/31/2012    Priority: High  Multifocal pneumonia 02/05/2023   RSV (respiratory syncytial virus pneumonia) 02/05/2023   AKI (acute kidney injury) (HCC) 02/05/2023   Rhabdomyolysis 02/05/2023   Metabolic acidosis 02/05/2023    Resolved Hospital Problems  No resolved problems to display.     Discharge Instructions     Diet general   Complete by: As directed    Discharge wound care:   Complete by: As directed    Apply  Xeroform gauze to left foot/heel Q day and cover with ABD pad and kerlex   Increase activity slowly   Complete by: As directed       Allergies as of 02/12/2023       Reactions   Ropinirole Hcl Other (See Comments)   Confusion, agiitation, disorientation, Hallucinations   Tramadol    lethargy        Medication List     TAKE these medications    apixaban 5 MG Tabs tablet Commonly known as: ELIQUIS Take 1 tablet (5 mg total) by mouth 2 (two) times daily.   aspirin 81 MG tablet Take 1 tablet (81 mg total) by mouth daily.   cholecalciferol 25 MCG (1000 UNIT) tablet Commonly known as: VITAMIN D3 Take 1,000 Units by mouth daily.   CoQ10 100 MG Caps Take 100 mg by mouth daily.   cyanocobalamin 1000 MCG tablet Commonly known as: VITAMIN B12 Take 1,000 mcg by mouth daily.   divalproex 500 MG 24 hr tablet Commonly known as: DEPAKOTE ER Take 1,000 mg by mouth at bedtime.   feeding supplement Liqd Take 237 mLs by mouth 2 (two) times daily between meals.   omeprazole 40 MG capsule Commonly known as: PRILOSEC TAKE 1 CAPSULE BY MOUTH EVERY DAY   simvastatin 40 MG tablet Commonly known as: ZOCOR TAKE 1 TABLET BY MOUTH EVERY DAY AT 6PM What changed: See the new instructions.   umeclidinium bromide 62.5 MCG/ACT Aepb Commonly known as: INCRUSE ELLIPTA Inhale 1 puff into the lungs daily.   ziprasidone 40 MG capsule Commonly known as: GEODON Take 40 mg by mouth at bedtime.               Discharge Care Instructions  (From admission, onward)           Start     Ordered   02/12/23 0000  Discharge wound care:       Comments: Apply Xeroform gauze to left foot/heel Q day and cover with ABD pad and kerlex   02/12/23 1232            Contact information for follow-up providers     [facility] Follow up.   Specialty: Cardiology Why: Monday Mar 12, 2023 Appt at 8:25 AM (25 min) Contact information: [address] [place] [address] [phone]             Contact information for after-discharge care     Destination     [facility] Preferred SNF .   Service: Skilled Nursing Contact information: [address] [place] [address] [phone]                    Allergies  Allergen Reactions   Ropinirole Hcl Other (See Comments)    Confusion, agiitation, disorientation, Hallucinations   Tramadol     lethargy    Consultations:   Procedures:   Discharge Exam: BP (!) 86/55 (BP Location: Left Arm)  Pulse 63   Temp (!) 97.5 F (36.4 C) (Oral)   Resp 16   Ht 5\' 10"  (1.778 m)   Wt 56.7 kg   SpO2 92%   BMI 17.94 kg/m  Physical Exam Constitutional:      General: He is not in acute distress.    Appearance: Normal appearance.     Comments: Chronically ill-appearing adult man lying in bed in no distress with slowed mentation and hard of hearing  HENT:     Head: Normocephalic and atraumatic.     Mouth/Throat:     Mouth: Mucous membranes are moist.  Eyes:     Extraocular Movements: Extraocular movements intact.  Cardiovascular:     Rate and Rhythm: Normal rate and regular rhythm.  Pulmonary:     Effort: Pulmonary effort is normal. No respiratory distress.     Breath sounds: Normal breath sounds. No wheezing.  Abdominal:     General: Bowel sounds are normal. There is no distension.     Palpations: Abdomen is soft.     Tenderness: There is no abdominal tenderness.  Musculoskeletal:        General: Normal range of motion.     Cervical back: Normal range of motion and neck supple.  Skin:    General: Skin is warm and dry.  Neurological:     Mental Status: He is disoriented.     Motor: Weakness present.  Psychiatric:        Mood and Affect: Mood normal.      The results of significant diagnostics from this hospitalization (including imaging,  microbiology, ancillary and laboratory) are listed below for reference.   Microbiology: Recent Results (from the past 240 hours)  Resp panel by RT-PCR (RSV, Flu A&B, Covid) Anterior Nasal Swab     Status: Abnormal   Collection Time: 02/05/23  1:01 PM   Specimen: Anterior Nasal Swab  Result Value Ref Range Status   SARS Coronavirus 2 by RT PCR NEGATIVE NEGATIVE Final   Influenza A by PCR NEGATIVE NEGATIVE Final   Influenza B by PCR NEGATIVE NEGATIVE Final    Comment: (NOTE) The Xpert Xpress SARS-CoV-2/FLU/RSV plus assay is intended as an aid in the diagnosis of influenza from Nasopharyngeal swab specimens and should not be used as a sole basis for treatment. Nasal washings and aspirates are unacceptable for Xpert Xpress SARS-CoV-2/FLU/RSV testing.  Fact Sheet for Patients: BloggerCourse.com  Fact Sheet for Healthcare Providers: SeriousBroker.it  This test is not yet approved or cleared by the [place] FDA and has been authorized for detection and/or diagnosis of SARS-CoV-2 by FDA under an Emergency Use Authorization (EUA). This EUA will remain in effect (meaning this test can be used) for the duration of the COVID-19 declaration under Section 564(b)(1) of the Act, 21 U.S.C. section 360bbb-3(b)(1), unless the authorization is terminated or revoked.     Resp Syncytial Virus by PCR POSITIVE (A) NEGATIVE Final    Comment: (NOTE) Fact Sheet for Patients: BloggerCourse.com  Fact Sheet for Healthcare Providers: SeriousBroker.it  This test is not yet approved or cleared by the [place] FDA and has been authorized for detection and/or diagnosis of SARS-CoV-2 by FDA under an Emergency Use Authorization (EUA). This EUA will remain in effect (meaning this test can be used) for the duration of the COVID-19 declaration under Section 564(b)(1) of the Act, 21 U.S.C. section  360bbb-3(b)(1), unless the authorization is terminated or revoked.  Performed at [facility], [address], [place], [place] [address]  Culture, blood (Routine X 2) w Reflex to ID Panel     Status: None   Collection Time: 02/05/23  8:30 PM   Specimen: BLOOD  Result Value Ref Range Status   Specimen Description BLOOD SITE NOT SPECIFIED  Final   Special Requests   Final    BOTTLES DRAWN AEROBIC AND ANAEROBIC Blood Culture results may not be optimal due to an inadequate volume of blood received in culture bottles   Culture   Final    NO GROWTH 5 DAYS Performed at [facility], [address] [place], [place] [address]    Report Status 02/10/2023 FINAL  Final  Culture, blood (Routine X 2) w Reflex to ID Panel     Status: None   Collection Time: 02/05/23  8:32 PM   Specimen: BLOOD  Result Value Ref Range Status   Specimen Description BLOOD SITE NOT SPECIFIED  Final   Special Requests   Final    BOTTLES DRAWN AEROBIC AND ANAEROBIC Blood Culture results may not be optimal due to an inadequate volume of blood received in culture bottles   Culture   Final    NO GROWTH 5 DAYS Performed at [facility], [address] [place], [place] [address]    Report Status 02/10/2023 FINAL  Final     Labs: BNP (last 3 results) Recent Labs    11/03/22 1115 11/26/22 1520  BNP 87.4 77.0   Basic Metabolic Panel: Recent Labs  Lab 02/05/23 1237 02/06/23 0450 02/07/23 0523 02/08/23 0351 02/09/23 0435 02/10/23 0344 02/12/23 0409  NA 145 143 137 137 135 139 138  K 4.1 4.2 3.8 3.8 3.5 3.7 3.9  CL 108 105 102 102 105 106 105  CO2 16* 22 25 25 25 22 25   GLUCOSE 106* 88 86 88 83 95 92  BUN 26* 47* 35* 23 25* 22 24*  CREATININE 3.18* 2.47* 1.50* 1.16 1.30* 1.03 1.17  CALCIUM 9.9 8.1* 8.2* 8.2* 8.4* 8.7* 8.9  MG 2.6* 2.3 2.1 1.9  --   --   --   PHOS  --  5.3* 3.0 2.9  --   --   --    Liver Function Tests: Recent Labs  Lab 02/07/23 0523 02/08/23 0351 02/09/23 0435  02/10/23 0344 02/12/23 0409  AST 487* 294* 195* 155* 70*  ALT 109* 96* 82* 78* 58*  ALKPHOS 65 53 57 60 71  BILITOT 0.4 0.5 0.6 0.5 0.3  PROT 5.7* 5.2* 5.0* 5.0* 5.6*  ALBUMIN 2.2* 2.0* 1.8* 1.9* 2.2*   No results for input(s): "LIPASE", "AMYLASE" in the last 168 hours. Recent Labs  Lab 02/07/23 2031  AMMONIA 23   CBC: Recent Labs  Lab 02/05/23 1237 02/06/23 0450 02/07/23 0523 02/08/23 0351 02/09/23 0435 02/10/23 0344 02/12/23 0409  WBC 34.5*   < > 23.6* 17.1* 17.0* 19.1* 20.6*  NEUTROABS 30.7*  --   --  14.5* 14.4* 16.1*  --   HGB 12.0*   < > 10.3* 9.1* 9.5* 9.9* 10.9*  HCT 36.5*   < > 29.8* 26.6* 27.7* 28.8* 32.5*  MCV 104.0*   < > 100.0 100.4* 99.6 100.3* 100.6*  PLT 352   < > 202 171 167 168 258   < > = values in this interval not displayed.   Cardiac Enzymes: Recent Labs  Lab 02/06/23 0450 02/07/23 0523 02/08/23 0351 02/09/23 0435 02/10/23 0344  CKTOTAL 15,216* 7,258* 2,891* 1,663* 764*   BNP: Invalid input(s): "POCBNP" CBG: No results for  input(s): "GLUCAP" in the last 168 hours. D-Dimer No results for input(s): "DDIMER" in the last 72 hours. Hgb A1c No results for input(s): "HGBA1C" in the last 72 hours. Lipid Profile No results for input(s): "CHOL", "HDL", "LDLCALC", "TRIG", "CHOLHDL", "LDLDIRECT" in the last 72 hours. Thyroid function studies No results for input(s): "TSH", "T4TOTAL", "T3FREE", "THYROIDAB" in the last 72 hours.  Invalid input(s): "FREET3" Anemia work up No results for input(s): "VITAMINB12", "FOLATE", "FERRITIN", "TIBC", "IRON", "RETICCTPCT" in the last 72 hours. Urinalysis    Component Value Date/Time   COLORURINE YELLOW (A) 11/27/2022 0340   APPEARANCEUR HAZY (A) 11/27/2022 0340   APPEARANCEUR Hazy 07/20/2012 1840   LABSPEC 1.022 11/27/2022 0340   LABSPEC 1.024 07/20/2012 1840   PHURINE 5.0 11/27/2022 0340   GLUCOSEU >=500 (A) 11/27/2022 0340   GLUCOSEU Negative 07/20/2012 1840   HGBUR NEGATIVE 11/27/2022 0340    BILIRUBINUR Negative 01/19/2023 1314   BILIRUBINUR Negative 07/20/2012 1840   KETONESUR NEGATIVE 11/27/2022 0340   PROTEINUR Negative 01/19/2023 1314   PROTEINUR NEGATIVE 11/27/2022 0340   UROBILINOGEN 0.2 01/19/2023 1314   UROBILINOGEN 1.0 06/23/2008 2233   NITRITE Negative 01/19/2023 1314   NITRITE NEGATIVE 11/27/2022 0340   LEUKOCYTESUR Negative 01/19/2023 1314   LEUKOCYTESUR NEGATIVE 11/27/2022 0340   LEUKOCYTESUR Trace 07/20/2012 1840   Sepsis Labs Recent Labs  Lab 02/08/23 0351 02/09/23 0435 02/10/23 0344 02/12/23 0409  WBC 17.1* 17.0* 19.1* 20.6*   Microbiology Recent Results (from the past 240 hours)  Resp panel by RT-PCR (RSV, Flu A&B, Covid) Anterior Nasal Swab     Status: Abnormal   Collection Time: 02/05/23  1:01 PM   Specimen: Anterior Nasal Swab  Result Value Ref Range Status   SARS Coronavirus 2 by RT PCR NEGATIVE NEGATIVE Final   Influenza A by PCR NEGATIVE NEGATIVE Final   Influenza B by PCR NEGATIVE NEGATIVE Final    Comment: (NOTE) The Xpert Xpress SARS-CoV-2/FLU/RSV plus assay is intended as an aid in the diagnosis of influenza from Nasopharyngeal swab specimens and should not be used as a sole basis for treatment. Nasal washings and aspirates are unacceptable for Xpert Xpress SARS-CoV-2/FLU/RSV testing.  Fact Sheet for Patients: BloggerCourse.com  Fact Sheet for Healthcare Providers: SeriousBroker.it  This test is not yet approved or cleared by the [place] FDA and has been authorized for detection and/or diagnosis of SARS-CoV-2 by FDA under an Emergency Use Authorization (EUA). This EUA will remain in effect (meaning this test can be used) for the duration of the COVID-19 declaration under Section 564(b)(1) of the Act, 21 U.S.C. section 360bbb-3(b)(1), unless the authorization is terminated or revoked.     Resp Syncytial Virus by PCR POSITIVE (A) NEGATIVE Final    Comment: (NOTE) Fact  Sheet for Patients: BloggerCourse.com  Fact Sheet for Healthcare Providers: SeriousBroker.it  This test is not yet approved or cleared by the [place] FDA and has been authorized for detection and/or diagnosis of SARS-CoV-2 by FDA under an Emergency Use Authorization (EUA). This EUA will remain in effect (meaning this test can be used) for the duration of the COVID-19 declaration under Section 564(b)(1) of the Act, 21 U.S.C. section 360bbb-3(b)(1), unless the authorization is terminated or revoked.  Performed at [facility], [address], [place], [place] [address]   Culture, blood (Routine X 2) w Reflex to ID Panel     Status: None   Collection Time: 02/05/23  8:30 PM   Specimen: BLOOD  Result Value Ref Range Status  Specimen Description BLOOD SITE NOT SPECIFIED  Final   Special Requests   Final    BOTTLES DRAWN AEROBIC AND ANAEROBIC Blood Culture results may not be optimal due to an inadequate volume of blood received in culture bottles   Culture   Final    NO GROWTH 5 DAYS Performed at [facility], [address], [place], [place] [address]    Report Status 02/10/2023 FINAL  Final  Culture, blood (Routine X 2) w Reflex to ID Panel     Status: None   Collection Time: 02/05/23  8:32 PM   Specimen: BLOOD  Result Value Ref Range Status   Specimen Description BLOOD SITE NOT SPECIFIED  Final   Special Requests   Final    BOTTLES DRAWN AEROBIC AND ANAEROBIC Blood Culture results may not be optimal due to an inadequate volume of blood received in culture bottles   Culture   Final    NO GROWTH 5 DAYS Performed at [facility], [address] [place], [place] [address]    Report Status 02/10/2023 FINAL  Final    Procedures/Studies: DG Ankle Complete Left Result Date: 02/10/2023 CLINICAL DATA:  Left ankle pain EXAM: LEFT ANKLE COMPLETE - 3+ VIEW COMPARISON:  None Available. FINDINGS: There is no evidence  of fracture, dislocation, or joint effusion. There is no evidence of arthropathy or other focal bone abnormality. Moderate soft tissue swelling superficial to the lateral malleolus. IMPRESSION: 1. Lateral soft tissue swelling. No fracture or dislocation. Electronically Signed   By: Helyn Numbers M.D.   On: 02/10/2023 03:29   CT ANGIO LOWER EXT BILAT W &/OR WO CONTRAST Result Date: 02/05/2023 CLINICAL DATA:  Claudication or leg ischemia Assymmetric pulses, hx of claudication in the left leg EXAM: CT ANGIOGRAPHY OF BILATERAL LOWER EXTREMITY TECHNIQUE: Multidetector CT imaging of the lower extremities was performed using the standard protocol during bolus administration of intravenous contrast. Multiplanar CT image reconstructions and MIPs were obtained to evaluate the vascular anatomy. RADIATION DOSE REDUCTION: This exam was performed according to the departmental dose-optimization program which includes automated exposure control, adjustment of the mA and/or kV according to patient size and/or use of iterative reconstruction technique. CONTRAST:  OMNIPAQUE IOHEXOL 350 MG/ML SOLN COMPARISON:  None Available. FINDINGS: RIGHT Lower Extremity Inflow: Common, internal and external iliac arteries are patent without evidence of aneurysm, dissection, or vasculitis. The common and external iliac arteries are moderately calcified. Outflow: 1 calcified plaque causes approximately high-grade the profundus femoral artery is patent. Proximal superficial femoral artery is narrowed and small in caliber. Moderate plaque throughout the superficial femoral artery with areas of intermittent narrowing. Moderate narrowing of the popliteal due to calcified plaque. Stenosis in the proximal common femoral artery. Runoff: The calf vessels are difficult to accurately assess due to calcifications and small caliber. There is 2 caliber runoff to the ankle. LEFT Lower Extremity Inflow: Common, internal and external iliac arteries are  patent without evidence of aneurysm, dissection, or vasculitis. Moderate plaque involving the common and proximal external iliac artery. Outflow: Bulky plaque at the common femoral artery crosses high-grade stenosis. The profundus femoral artery is patent. The superficial femoral artery is densely calcified and diminutive. Superficial femoral artery is occluded over the course of 6 cm, series 3, image 119-149. There is 30 distal reconstitution within a diminutive superficial femoral artery distally. The popliteal artery is severely diseased and small in caliber. Runoff: Calf vessels are difficult to accurately assess due to calcifications and small caliber there  is 2 vessel runoff to the ankle. Veins: No obvious venous abnormality within the limitations of this arterial phase study. Review of the MIP images confirms the above findings. NON-VASCULAR No acute intramuscular findings. Acute osseous findings. Mild degenerative change of both knees. IMPRESSION: 1. Occlusion of the left superficial femoral artery over the course of 6 cm. There is distal reconstitution within a diminutive superficial femoral artery distally. The popliteal artery is severely diseased and small in caliber. 2. High-grade stenosis of the left common femoral artery. 3. High-grade stenosis of the proximal right common femoral artery. 4. Moderate plaque throughout the right superficial femoral artery with areas of intermittent narrowing. Moderate narrowing of the right popliteal artery. 5. Calf vessels are difficult to accurately assess due to calcifications and small caliber. There is 2 vessel runoff to the ankle bilaterally. These results were called by telephone at the time of interpretation on 02/05/2023 at 5:27 pm to provider RAY, who verbally acknowledged these results. Electronically Signed   By: Narda Rutherford M.D.   On: 02/05/2023 17:27   CT Angio Chest/Abd/Pel for Dissection W and/or Wo Contrast Result Date: 02/05/2023 CLINICAL DATA:   Acute aortic syndrome suspected. Shortness of breath. EXAM: CT ANGIOGRAPHY CHEST, ABDOMEN AND PELVIS TECHNIQUE: Non-contrast CT of the chest was initially obtained. Multidetector CT imaging through the chest, abdomen and pelvis was performed using the standard protocol during bolus administration of intravenous contrast. Multiplanar reconstructed images and MIPs were obtained and reviewed to evaluate the vascular anatomy. RADIATION DOSE REDUCTION: This exam was performed according to the departmental dose-optimization program which includes automated exposure control, adjustment of the mA and/or kV according to patient size and/or use of iterative reconstruction technique. CONTRAST:  OMNIPAQUE IOHEXOL 350 MG/ML SOLN COMPARISON:  Chest radiograph earlier today.  Chest CT 11/26/2022 FINDINGS: CTA CHEST FINDINGS Cardiovascular: No aortic hematoma on noncontrast exam. There is no dissection, aneurysm or acute aortic finding. Moderate aortic atherosclerosis and tortuosity. There is no central pulmonary embolus to the segmental level. The heart is normal in size. There are coronary artery calcifications. No pericardial effusion. Mediastinum/Nodes: 11 mm subcarinal node. No enlarged hilar lymph nodes. Axillary adenopathy. There is moderate diffuse esophageal wall thickening. Lungs/Pleura: Nodular area in the medial left lower lobe abutting the inferior hilum measuring 2.8 x 2 cm series 9, image 51. Mild soft tissue images there is central low-density series 7, image 57. This is increasing from prior exam. Stable irregular density in the left upper lobe adjacent to fiducial marker measuring 16 x 8 mm series 9, image 35. Posterior right apical scarring. Minimal patchy ground-glass in the anterior left upper lobe series 9, image 51 and right upper lobe series 9, image 40. This is new from prior exam. Increasing ground-glass and nodular opacities in the dependent right lower lobe. Moderate emphysema. Lower lobe  bronchial thickening with areas of mucoid impaction involving the segmental left lower lobe and subsegmental right lower lobe. No significant pleural effusion. Musculoskeletal: No suspicious bone lesion or acute osseous findings. Remote right rib fractures. Thoracic spondylosis. Review of the MIP images confirms the above findings. CTA ABDOMEN AND PELVIS FINDINGS VASCULAR Aorta: Moderate atherosclerosis. Normal caliber aorta without aneurysm, dissection, vasculitis or significant stenosis. Celiac: Mild plaque at the origin with minor stenosis. The distal branches are patent. SMA: Noncalcified plaque in the proximal SMA spanning 15 mm causes approximately 50% luminal narrowing. The distal branches are patent. Replaced right hepatic artery arises from the SMA. . Renals: Single bilateral renal arteries. There is moderate  plaque at the origin of the renal arteries. Mild stenosis on the right. The distal branches are patent. No dissection. IMA: Patent without evidence of aneurysm, dissection, vasculitis or significant stenosis. Inflow: Moderately calcified. Patent without evidence of aneurysm, dissection, vasculitis or significant stenosis. Lower extremity CT performed separately. Veins: No obvious venous abnormality within the limitations of this arterial phase study. Review of the MIP images confirms the above findings. NON-VASCULAR Hepatobiliary: No focal liver abnormality on this arterial phase exam. Unremarkable gallbladder. Pancreas: No ductal dilatation or inflammation. Spleen: Normal in size and arterial enhancement. Adrenals/Urinary Tract: No adrenal nodule. No hydronephrosis or suspicious renal abnormality on arterial phase imaging. Unremarkable urinary bladder. Stomach/Bowel: Wall thickening at the gastroesophageal junction. No bowel obstruction or inflammation. Normal appendix. Small to moderate colonic stool burden. There is moderate stool distending the rectum. Lymphatic: No lymphadenopathy. Reproductive:  Large prostate spans 5.1 cm transverse. Other: No ascites or free air.  No abdominal wall hernia. Musculoskeletal: Postsurgical and degenerative change in the spine. No acute osseous findings or focal bone lesion. Review of the MIP images confirms the above findings. IMPRESSION: 1. No aortic dissection or aneurysm.  No central pulmonary embolus. 2. Increasing nodular area in the medial left lower lobe abutting the inferior hilum measuring 2.8 x 2 cm. There is central low-density which may represent necrosis. This is suspicious for recurrent malignancy. Oncologic follow-up is recommended, consider PET. 3. Nonspecific 11 mm subcarinal lymph node. 4. Stable irregular density in the left upper lobe adjacent to fiducial marker measuring 16 x 8 mm. 5. Increasing ground-glass and nodular opacities in the dependent right lower lobe, new ground-glass in the anterior left upper lobe and right upper lobe. This is suspicious for multifocal pneumonia. 6. Lower lobe bronchial thickening with areas of mucoid impaction involving the segmental left lower lobe and subsegmental right lower lobe. 7. Moderate diffuse esophageal wall thickening, similar or minimally progressed from prior exam. Wall thickening at the gastroesophageal junction. Aortic Atherosclerosis (ICD10-I70.0) and Emphysema (ICD10-J43.9). Electronically Signed   By: Narda Rutherford M.D.   On: 02/05/2023 17:17   CT Head Wo Contrast Result Date: 02/05/2023 CLINICAL DATA:  Mental status change EXAM: CT HEAD WITHOUT CONTRAST TECHNIQUE: Contiguous axial images were obtained from the base of the skull through the vertex without intravenous contrast. RADIATION DOSE REDUCTION: This exam was performed according to the departmental dose-optimization program which includes automated exposure control, adjustment of the mA and/or kV according to patient size and/or use of iterative reconstruction technique. COMPARISON:  CT head 11/26/2022 FINDINGS: Brain: No intracranial  hemorrhage, mass effect, or evidence of acute infarct. No hydrocephalus. No extra-axial fluid collection. Age-commensurate cerebral atrophy and chronic small vessel ischemic disease. Vascular: No hyperdense vessel. Intracranial arterial calcification. Skull: No fracture or focal lesion. Sinuses/Orbits: No acute finding. Other: None. IMPRESSION: No acute intracranial abnormality. Electronically Signed   By: Minerva Fester M.D.   On: 02/05/2023 15:42   DG Hand Complete Left Result Date: 02/05/2023 CLINICAL DATA:  Pain after fall EXAM: LEFT HAND - COMPLETE 3 VIEW COMPARISON:  None Available. FINDINGS: Osteopenia. No fracture or dislocation. Joint space loss and osteophytes seen of the interphalangeal joint of the thumb. Is also moderate joint space loss of the first carpometacarpal joint. Mild elsewhere. IMPRESSION: Osteopenia and degenerative changes. Electronically Signed   By: Karen Kays M.D.   On: 02/05/2023 14:43   DG Knee Complete 4 Views Left Result Date: 02/05/2023 CLINICAL DATA:  Fall and trauma to the left knee. EXAM: LEFT KNEE -  COMPLETE 4+ VIEW COMPARISON:  None Available. FINDINGS: There is no acute fracture or dislocation. The bones are osteopenic. Mild arthritic changes of the knee. No joint effusion. The soft tissues are unremarkable. IMPRESSION: 1. No acute fracture or dislocation. 2. Mild arthritic changes. Electronically Signed   By: Elgie Collard M.D.   On: 02/05/2023 14:42   DG Chest Port 1 View Result Date: 02/05/2023 CLINICAL DATA:  Shortness of breath EXAM: PORTABLE CHEST 1 VIEW COMPARISON:  01/19/2023 FINDINGS: Hyperinflation. No consolidation, pneumothorax or effusion. No edema. Normal cardiopericardial silhouette. Calcified aorta. Overlapping cardiac leads. Surgical clips in the left hilum. Degenerative changes. IMPRESSION: Hyperinflation with chronic changes.  Surgical clips. Electronically Signed   By: Karen Kays M.D.   On: 02/05/2023 14:13   CT Cervical Spine Wo  Contrast Result Date: 02/05/2023 CLINICAL DATA:  Neck trauma (Age >= 65y).  Mental status changes. EXAM: CT CERVICAL SPINE WITHOUT CONTRAST TECHNIQUE: Multidetector CT imaging of the cervical spine was performed without intravenous contrast. Multiplanar CT image reconstructions were also generated. RADIATION DOSE REDUCTION: This exam was performed according to the departmental dose-optimization program which includes automated exposure control, adjustment of the mA and/or kV according to patient size and/or use of iterative reconstruction technique. COMPARISON:  None Available. FINDINGS: Alignment: Normal Skull base and vertebrae: No acute fracture. No primary bone lesion or focal pathologic process. Soft tissues and spinal canal: No prevertebral fluid or swelling. No visible canal hematoma. Disc levels: Degenerative disc disease changes in the mid to lower cervical spine with disc space narrowing and spurring. Moderate bilateral degenerative facet disease. Upper chest: Emphysema, biapical scarring. Other: None IMPRESSION: Degenerative disc and facet disease.  No acute bony abnormality. Electronically Signed   By: Charlett Nose M.D.   On: 02/05/2023 13:30   DG Chest 2 View Result Date: 01/20/2023 CLINICAL DATA:  Shortness of breath EXAM: CHEST - 2 VIEW COMPARISON:  Chest radiograph 12/17/2022 FINDINGS: Stable cardiac and mediastinal contours. Lungs are hyperinflated. Postsurgical changes left hilar region. No large area of pulmonary consolidation. No pleural effusion or pneumothorax. Thoracic spine degenerative changes. Chronic scarring and emphysema. IMPRESSION: No active cardiopulmonary disease. Electronically Signed   By: Annia Belt M.D.   On: 01/20/2023 18:43     Time coordinating discharge: Over 30 minutes    Lewie Chamber, MD  [facility] 02/12/2023, 12:36 PM

## 2023-02-12 NOTE — Plan of Care (Signed)
  Problem: Education: Goal: Knowledge of General Education information will improve Description: Including pain rating scale, medication(s)/side effects and non-pharmacologic comfort measures Outcome: Adequate for Discharge   Problem: Health Behavior/Discharge Planning: Goal: Ability to manage health-related needs will improve Outcome: Adequate for Discharge   Problem: Clinical Measurements: Goal: Ability to maintain clinical measurements within normal limits will improve Outcome: Adequate for Discharge Goal: Will remain free from infection Outcome: Adequate for Discharge Goal: Diagnostic test results will improve Outcome: Adequate for Discharge Goal: Respiratory complications will improve Outcome: Adequate for Discharge Goal: Cardiovascular complication will be avoided Outcome: Adequate for Discharge   Problem: Activity: Goal: Risk for activity intolerance will decrease Outcome: Adequate for Discharge   Problem: Nutrition: Goal: Adequate nutrition will be maintained Outcome: Adequate for Discharge   Problem: Coping: Goal: Level of anxiety will decrease Outcome: Adequate for Discharge   Problem: Elimination: Goal: Will not experience complications related to bowel motility Outcome: Adequate for Discharge Goal: Will not experience complications related to urinary retention Outcome: Adequate for Discharge   Problem: Pain Managment: Goal: General experience of comfort will improve and/or be controlled Outcome: Adequate for Discharge   Problem: Safety: Goal: Ability to remain free from injury will improve Outcome: Adequate for Discharge   Problem: Skin Integrity: Goal: Risk for impaired skin integrity will decrease Outcome: Adequate for Discharge   Problem: Acute Rehab OT Goals (only OT should resolve) Goal: Pt. Will Perform Grooming Outcome: Adequate for Discharge Goal: Pt. Will Perform Lower Body Dressing Outcome: Adequate for Discharge Goal: Pt. Will Transfer To  Toilet Outcome: Adequate for Discharge Goal: Pt/Caregiver Will Perform Home Exercise Program Outcome: Adequate for Discharge   Problem: Education: Goal: Knowledge of disease and its progression will improve Outcome: Adequate for Discharge   Problem: Health Behavior/Discharge Planning: Goal: Ability to manage health-related needs will improve Outcome: Adequate for Discharge   Problem: Respiratory: Goal: Respiratory symptoms related to disease process will be avoided Outcome: Adequate for Discharge   Problem: Urinary Elimination: Goal: Progression of disease will be identified and treated Outcome: Adequate for Discharge

## 2023-02-14 DIAGNOSIS — M6282 Rhabdomyolysis: Secondary | ICD-10-CM | POA: Diagnosis not present

## 2023-02-14 DIAGNOSIS — F419 Anxiety disorder, unspecified: Secondary | ICD-10-CM | POA: Diagnosis not present

## 2023-02-14 DIAGNOSIS — J449 Chronic obstructive pulmonary disease, unspecified: Secondary | ICD-10-CM | POA: Diagnosis not present

## 2023-02-14 DIAGNOSIS — I4892 Unspecified atrial flutter: Secondary | ICD-10-CM | POA: Diagnosis not present

## 2023-02-14 DIAGNOSIS — J189 Pneumonia, unspecified organism: Secondary | ICD-10-CM | POA: Diagnosis not present

## 2023-02-14 DIAGNOSIS — F319 Bipolar disorder, unspecified: Secondary | ICD-10-CM | POA: Diagnosis not present

## 2023-02-14 DIAGNOSIS — F0390 Unspecified dementia without behavioral disturbance: Secondary | ICD-10-CM | POA: Diagnosis not present

## 2023-02-14 DIAGNOSIS — Z85118 Personal history of other malignant neoplasm of bronchus and lung: Secondary | ICD-10-CM | POA: Diagnosis not present

## 2023-02-14 DIAGNOSIS — N179 Acute kidney failure, unspecified: Secondary | ICD-10-CM | POA: Diagnosis not present

## 2023-02-14 DIAGNOSIS — L97421 Non-pressure chronic ulcer of left heel and midfoot limited to breakdown of skin: Secondary | ICD-10-CM | POA: Diagnosis not present

## 2023-02-14 DIAGNOSIS — I639 Cerebral infarction, unspecified: Secondary | ICD-10-CM | POA: Diagnosis not present

## 2023-02-14 DIAGNOSIS — L89013 Pressure ulcer of right elbow, stage 3: Secondary | ICD-10-CM | POA: Diagnosis not present

## 2023-02-14 DIAGNOSIS — N182 Chronic kidney disease, stage 2 (mild): Secondary | ICD-10-CM | POA: Diagnosis not present

## 2023-02-14 DIAGNOSIS — I70244 Atherosclerosis of native arteries of left leg with ulceration of heel and midfoot: Secondary | ICD-10-CM | POA: Diagnosis not present

## 2023-02-14 DIAGNOSIS — N400 Enlarged prostate without lower urinary tract symptoms: Secondary | ICD-10-CM | POA: Diagnosis not present

## 2023-02-14 DIAGNOSIS — B338 Other specified viral diseases: Secondary | ICD-10-CM | POA: Diagnosis not present

## 2023-02-14 NOTE — Telephone Encounter (Signed)
 Left a message for the patient or his sister to call back.

## 2023-02-15 NOTE — Telephone Encounter (Signed)
 Left a message for the patient to call back.

## 2023-02-16 DIAGNOSIS — L89213 Pressure ulcer of right hip, stage 3: Secondary | ICD-10-CM | POA: Diagnosis not present

## 2023-02-16 DIAGNOSIS — I4892 Unspecified atrial flutter: Secondary | ICD-10-CM | POA: Diagnosis not present

## 2023-02-16 DIAGNOSIS — F319 Bipolar disorder, unspecified: Secondary | ICD-10-CM | POA: Diagnosis not present

## 2023-02-16 DIAGNOSIS — J189 Pneumonia, unspecified organism: Secondary | ICD-10-CM | POA: Diagnosis not present

## 2023-02-16 DIAGNOSIS — N182 Chronic kidney disease, stage 2 (mild): Secondary | ICD-10-CM | POA: Diagnosis not present

## 2023-02-16 DIAGNOSIS — L89013 Pressure ulcer of right elbow, stage 3: Secondary | ICD-10-CM | POA: Diagnosis not present

## 2023-02-16 DIAGNOSIS — J449 Chronic obstructive pulmonary disease, unspecified: Secondary | ICD-10-CM | POA: Diagnosis not present

## 2023-02-16 DIAGNOSIS — L97321 Non-pressure chronic ulcer of left ankle limited to breakdown of skin: Secondary | ICD-10-CM | POA: Diagnosis not present

## 2023-02-16 DIAGNOSIS — N400 Enlarged prostate without lower urinary tract symptoms: Secondary | ICD-10-CM | POA: Diagnosis not present

## 2023-02-16 DIAGNOSIS — B338 Other specified viral diseases: Secondary | ICD-10-CM | POA: Diagnosis not present

## 2023-02-16 DIAGNOSIS — L97421 Non-pressure chronic ulcer of left heel and midfoot limited to breakdown of skin: Secondary | ICD-10-CM | POA: Diagnosis not present

## 2023-02-16 DIAGNOSIS — F0390 Unspecified dementia without behavioral disturbance: Secondary | ICD-10-CM | POA: Diagnosis not present

## 2023-02-16 NOTE — Telephone Encounter (Signed)
 Left a message for the patient to call back.  Third attempt to reach the patient or his family.

## 2023-02-19 DIAGNOSIS — N400 Enlarged prostate without lower urinary tract symptoms: Secondary | ICD-10-CM | POA: Diagnosis not present

## 2023-02-19 DIAGNOSIS — L97421 Non-pressure chronic ulcer of left heel and midfoot limited to breakdown of skin: Secondary | ICD-10-CM | POA: Diagnosis not present

## 2023-02-19 DIAGNOSIS — B338 Other specified viral diseases: Secondary | ICD-10-CM | POA: Diagnosis not present

## 2023-02-19 DIAGNOSIS — L97321 Non-pressure chronic ulcer of left ankle limited to breakdown of skin: Secondary | ICD-10-CM | POA: Diagnosis not present

## 2023-02-19 DIAGNOSIS — J189 Pneumonia, unspecified organism: Secondary | ICD-10-CM | POA: Diagnosis not present

## 2023-02-19 DIAGNOSIS — J449 Chronic obstructive pulmonary disease, unspecified: Secondary | ICD-10-CM | POA: Diagnosis not present

## 2023-02-19 DIAGNOSIS — L89213 Pressure ulcer of right hip, stage 3: Secondary | ICD-10-CM | POA: Diagnosis not present

## 2023-02-19 DIAGNOSIS — I4892 Unspecified atrial flutter: Secondary | ICD-10-CM | POA: Diagnosis not present

## 2023-02-19 DIAGNOSIS — L89013 Pressure ulcer of right elbow, stage 3: Secondary | ICD-10-CM | POA: Diagnosis not present

## 2023-02-19 DIAGNOSIS — F319 Bipolar disorder, unspecified: Secondary | ICD-10-CM | POA: Diagnosis not present

## 2023-02-19 DIAGNOSIS — F0390 Unspecified dementia without behavioral disturbance: Secondary | ICD-10-CM | POA: Diagnosis not present

## 2023-02-20 DIAGNOSIS — L89213 Pressure ulcer of right hip, stage 3: Secondary | ICD-10-CM | POA: Diagnosis not present

## 2023-02-20 DIAGNOSIS — I4892 Unspecified atrial flutter: Secondary | ICD-10-CM | POA: Diagnosis not present

## 2023-02-20 DIAGNOSIS — B338 Other specified viral diseases: Secondary | ICD-10-CM | POA: Diagnosis not present

## 2023-02-20 DIAGNOSIS — J189 Pneumonia, unspecified organism: Secondary | ICD-10-CM | POA: Diagnosis not present

## 2023-02-20 DIAGNOSIS — L97321 Non-pressure chronic ulcer of left ankle limited to breakdown of skin: Secondary | ICD-10-CM | POA: Diagnosis not present

## 2023-02-20 DIAGNOSIS — J449 Chronic obstructive pulmonary disease, unspecified: Secondary | ICD-10-CM | POA: Diagnosis not present

## 2023-02-20 DIAGNOSIS — L89013 Pressure ulcer of right elbow, stage 3: Secondary | ICD-10-CM | POA: Diagnosis not present

## 2023-02-20 DIAGNOSIS — N400 Enlarged prostate without lower urinary tract symptoms: Secondary | ICD-10-CM | POA: Diagnosis not present

## 2023-02-20 DIAGNOSIS — F0390 Unspecified dementia without behavioral disturbance: Secondary | ICD-10-CM | POA: Diagnosis not present

## 2023-02-20 DIAGNOSIS — L97421 Non-pressure chronic ulcer of left heel and midfoot limited to breakdown of skin: Secondary | ICD-10-CM | POA: Diagnosis not present

## 2023-02-20 DIAGNOSIS — F319 Bipolar disorder, unspecified: Secondary | ICD-10-CM | POA: Diagnosis not present

## 2023-02-21 DIAGNOSIS — N400 Enlarged prostate without lower urinary tract symptoms: Secondary | ICD-10-CM | POA: Diagnosis not present

## 2023-02-21 DIAGNOSIS — L97321 Non-pressure chronic ulcer of left ankle limited to breakdown of skin: Secondary | ICD-10-CM | POA: Diagnosis not present

## 2023-02-21 DIAGNOSIS — F0390 Unspecified dementia without behavioral disturbance: Secondary | ICD-10-CM | POA: Diagnosis not present

## 2023-02-21 DIAGNOSIS — J449 Chronic obstructive pulmonary disease, unspecified: Secondary | ICD-10-CM | POA: Diagnosis not present

## 2023-02-21 DIAGNOSIS — L89013 Pressure ulcer of right elbow, stage 3: Secondary | ICD-10-CM | POA: Diagnosis not present

## 2023-02-21 DIAGNOSIS — I70244 Atherosclerosis of native arteries of left leg with ulceration of heel and midfoot: Secondary | ICD-10-CM | POA: Diagnosis not present

## 2023-02-21 DIAGNOSIS — J189 Pneumonia, unspecified organism: Secondary | ICD-10-CM | POA: Diagnosis not present

## 2023-02-21 DIAGNOSIS — F419 Anxiety disorder, unspecified: Secondary | ICD-10-CM | POA: Diagnosis not present

## 2023-02-21 DIAGNOSIS — B338 Other specified viral diseases: Secondary | ICD-10-CM | POA: Diagnosis not present

## 2023-02-21 DIAGNOSIS — L97421 Non-pressure chronic ulcer of left heel and midfoot limited to breakdown of skin: Secondary | ICD-10-CM | POA: Diagnosis not present

## 2023-02-21 DIAGNOSIS — L89213 Pressure ulcer of right hip, stage 3: Secondary | ICD-10-CM | POA: Diagnosis not present

## 2023-02-21 DIAGNOSIS — F319 Bipolar disorder, unspecified: Secondary | ICD-10-CM | POA: Diagnosis not present

## 2023-02-21 DIAGNOSIS — I4892 Unspecified atrial flutter: Secondary | ICD-10-CM | POA: Diagnosis not present

## 2023-02-21 DIAGNOSIS — I639 Cerebral infarction, unspecified: Secondary | ICD-10-CM | POA: Diagnosis not present

## 2023-02-23 DIAGNOSIS — H353231 Exudative age-related macular degeneration, bilateral, with active choroidal neovascularization: Secondary | ICD-10-CM | POA: Diagnosis not present

## 2023-03-01 DIAGNOSIS — F0390 Unspecified dementia without behavioral disturbance: Secondary | ICD-10-CM | POA: Diagnosis not present

## 2023-03-01 DIAGNOSIS — L89013 Pressure ulcer of right elbow, stage 3: Secondary | ICD-10-CM | POA: Diagnosis not present

## 2023-03-01 DIAGNOSIS — J449 Chronic obstructive pulmonary disease, unspecified: Secondary | ICD-10-CM | POA: Diagnosis not present

## 2023-03-01 DIAGNOSIS — I4892 Unspecified atrial flutter: Secondary | ICD-10-CM | POA: Diagnosis not present

## 2023-03-01 DIAGNOSIS — L89213 Pressure ulcer of right hip, stage 3: Secondary | ICD-10-CM | POA: Diagnosis not present

## 2023-03-01 DIAGNOSIS — N400 Enlarged prostate without lower urinary tract symptoms: Secondary | ICD-10-CM | POA: Diagnosis not present

## 2023-03-01 DIAGNOSIS — L97321 Non-pressure chronic ulcer of left ankle limited to breakdown of skin: Secondary | ICD-10-CM | POA: Diagnosis not present

## 2023-03-01 DIAGNOSIS — B338 Other specified viral diseases: Secondary | ICD-10-CM | POA: Diagnosis not present

## 2023-03-01 DIAGNOSIS — J189 Pneumonia, unspecified organism: Secondary | ICD-10-CM | POA: Diagnosis not present

## 2023-03-01 DIAGNOSIS — F419 Anxiety disorder, unspecified: Secondary | ICD-10-CM | POA: Diagnosis not present

## 2023-03-01 DIAGNOSIS — F319 Bipolar disorder, unspecified: Secondary | ICD-10-CM | POA: Diagnosis not present

## 2023-03-01 DIAGNOSIS — L97421 Non-pressure chronic ulcer of left heel and midfoot limited to breakdown of skin: Secondary | ICD-10-CM | POA: Diagnosis not present

## 2023-03-07 DIAGNOSIS — I70244 Atherosclerosis of native arteries of left leg with ulceration of heel and midfoot: Secondary | ICD-10-CM | POA: Diagnosis not present

## 2023-03-07 DIAGNOSIS — L89213 Pressure ulcer of right hip, stage 3: Secondary | ICD-10-CM | POA: Diagnosis not present

## 2023-03-07 DIAGNOSIS — I639 Cerebral infarction, unspecified: Secondary | ICD-10-CM | POA: Diagnosis not present

## 2023-03-07 DIAGNOSIS — L97422 Non-pressure chronic ulcer of left heel and midfoot with fat layer exposed: Secondary | ICD-10-CM | POA: Diagnosis not present

## 2023-03-07 DIAGNOSIS — F0390 Unspecified dementia without behavioral disturbance: Secondary | ICD-10-CM | POA: Diagnosis not present

## 2023-03-09 ENCOUNTER — Telehealth: Payer: Self-pay | Admitting: Cardiovascular Disease

## 2023-03-09 DIAGNOSIS — I4891 Unspecified atrial fibrillation: Secondary | ICD-10-CM

## 2023-03-09 MED ORDER — APIXABAN 5 MG PO TABS: 5.0000 mg | ORAL_TABLET | Freq: Two times a day (BID) | ORAL | 0 refills | Status: DC

## 2023-03-09 NOTE — Telephone Encounter (Addendum)
 Eliquis 5mg  refill request received. Patient is 78 years old, weight-56.7kg, Crea-1.17 on 02/12/23, Diagnosis-Afib/flutter, and last seen by Dr. Wyline Mood in the [facility] for Consult and has an appt pending with Cadence Furth on 03/22/23. Dose is appropriate based on dosing criteria.   Will need to get approval to send refill since has not seen Cardiology (only as Hospital consult). Spoke with Thayer Ohm Pharmd at 1213pm and he verbalized  that since pt has an upcoming appt and saw Dr. Wyline Mood in the Hospital approved to send the eliquis refill.

## 2023-03-09 NOTE — Telephone Encounter (Signed)
*  STAT* If patient is at the pharmacy, call can be transferred to refill team.   1. Which medications need to be refilled? (please list name of each medication and dose if known) apixaban (ELIQUIS) 5 MG TABS tablet   2. Which pharmacy/location (including street and city if local pharmacy) is medication to be sent to? [id number] - [place], [place] - [address]   3. Do they need a 30 day or 90 day supply? 90  Patient has only two days of this medication left.

## 2023-03-12 ENCOUNTER — Ambulatory Visit: Payer: PPO | Admitting: Medical

## 2023-03-14 DIAGNOSIS — F3172 Bipolar disorder, in full remission, most recent episode hypomanic: Secondary | ICD-10-CM | POA: Diagnosis not present

## 2023-03-16 ENCOUNTER — Telehealth: Payer: Self-pay | Admitting: Family Medicine

## 2023-03-16 DIAGNOSIS — J449 Chronic obstructive pulmonary disease, unspecified: Secondary | ICD-10-CM

## 2023-03-16 DIAGNOSIS — R627 Adult failure to thrive: Secondary | ICD-10-CM

## 2023-03-16 NOTE — Telephone Encounter (Signed)
 Copied from CRM [id number]. Topic: Clinical - Medical Advice >> Mar 16, 2023  1:50 PM Haroldine Laws wrote: Reason for CRM: pt's wife is asking if Dr. Sherrie Mustache to put an order for her brother to be in [facility]  [phone]

## 2023-03-17 ENCOUNTER — Emergency Department
Admission: EM | Admit: 2023-03-17 | Discharge: 2023-04-10 | Disposition: E | Attending: Emergency Medicine | Admitting: Emergency Medicine

## 2023-03-17 DIAGNOSIS — R627 Adult failure to thrive: Secondary | ICD-10-CM | POA: Insufficient documentation

## 2023-03-17 DIAGNOSIS — I469 Cardiac arrest, cause unspecified: Secondary | ICD-10-CM | POA: Diagnosis not present

## 2023-03-17 MED ORDER — EPINEPHRINE 0.1 MG/10ML (10 MCG/ML) SYRINGE FOR IV PUSH (FOR BLOOD PRESSURE SUPPORT)
PREFILLED_SYRINGE | INTRAVENOUS | Status: DC | PRN
  Administered 2023-03-17: 10 ug via INTRAVENOUS

## 2023-03-17 MED ORDER — LIDOCAINE HCL (CARDIAC) PF 100 MG/5ML IV SOSY
PREFILLED_SYRINGE | INTRAVENOUS | Status: DC | PRN
  Administered 2023-03-17 (×2): 100 mg via INTRAVENOUS

## 2023-03-17 MED ORDER — MORPHINE SULFATE (PF) 2 MG/ML IV SOLN
2.0000 mg | Freq: Once | INTRAVENOUS | Status: AC
  Administered 2023-03-17: 2 mg via INTRAVENOUS
  Filled 2023-03-17: qty 1

## 2023-03-17 MED ORDER — CALCIUM CHLORIDE 10 % IV SOLN
INTRAVENOUS | Status: DC | PRN
  Administered 2023-03-17: 1 g via INTRAVENOUS

## 2023-03-17 MED ORDER — SODIUM BICARBONATE 8.4 % IV SOLN
INTRAVENOUS | Status: DC | PRN
  Administered 2023-03-17: 100 meq via INTRAVENOUS

## 2023-03-17 MED ORDER — MAGNESIUM SULFATE 50 % IJ SOLN
INTRAMUSCULAR | Status: DC | PRN
  Administered 2023-03-17: 1 g via INTRAVENOUS

## 2023-03-17 NOTE — Code Documentation (Signed)
Family at beside. Family given emotional support. 

## 2023-03-17 NOTE — Code Documentation (Signed)
 Family updated as to patient's status. Family at bedside and requesting comfort care only.  CPR stopped by Dr. Fuller Plan.  Comfort measures began.  Morphine given as ordered for pain and comfort.  O2 via [place] @ 2L for comfort.

## 2023-03-17 NOTE — Code Documentation (Signed)
Patient time of death occurred at 23:01. 

## 2023-03-17 NOTE — ED Provider Notes (Signed)
 [facility] Provider Note    Event Date/Time   First MD Initiated Contact with Patient 03/23/2023 2253     (approximate)   History   No chief complaint on file.   HPI  [name] is a 78 y.o. male  who comes in with cardiac arrest.  Patient had witnessed arrest.  Patient was coded for an hour and 15 minutes prior to arrival.  Multiple episodes of V-fib.  Glucose is normal patient given amnio 450 total, epi,   Physical Exam   Triage Vital Signs: ED Triage Vitals [04/04/2023 2244]  Encounter Vitals Group     BP (!) 82/44     Systolic BP Percentile      Diastolic BP Percentile      Pulse Rate 61     Resp      Temp      Temp src      SpO2 100 %     Weight      Height      Head Circumference      Peak Flow      Pain Score      Pain Loc      Pain Education      Exclude from Growth Chart     Most recent vital signs: Vitals:   04/04/2023 2244  BP: (!) 82/44  Pulse: 61  SpO2: 100%     General: Obtunded.  ET tube in place CV:  No pulse Resp:  Normal effort.  With bagging Abd:  No distention.  Other:  Pupils unreactive bilaterally   ED Results / Procedures / Treatments   Labs (all labs ordered are listed, but only abnormal results are displayed) Labs Reviewed - No data to display   EKG  My interpretation of EKG:    RADIOLOGY I have reviewed the xray personally and interpreted    PROCEDURES:  Critical Care performed: Yes, see critical care procedure note(s)  .Critical Care  Performed by: Concha Se, MD Authorized by: Concha Se, MD   Critical care provider statement:    Critical care time (minutes):  30   Critical care was necessary to treat or prevent imminent or life-threatening deterioration of the following conditions:  Cardiac failure   Critical care was time spent personally by me on the following activities:  Development of treatment plan with patient or surrogate, discussions with consultants,  evaluation of patient's response to treatment, examination of patient, ordering and review of laboratory studies, ordering and review of radiographic studies, ordering and performing treatments and interventions, pulse oximetry, re-evaluation of patient's condition and review of old charts .1-3 Lead EKG Interpretation  Performed by: Concha Se, MD Authorized by: Concha Se, MD     Interpretation: abnormal     ECG rate:  30   ECG rate assessment: bradycardic     Rhythm: sinus bradycardia     Ectopy: none     Conduction: normal      MEDICATIONS ORDERED IN ED: Medications  lidocaine (cardiac) 100 mg/76mL (XYLOCAINE) injection 2% (100 mg Intravenous Given 03/11/2023 2245)  sodium bicarbonate injection (100 mEq Intravenous Given 03/24/2023 2249)  calcium chloride injection (1 g Intravenous Given 03/25/2023 2233)  EPINEPhrine 10 mcg/mL Adult IV Push Syringe (For Blood Pressure Support) (10 mcg Intravenous Given 03/31/2023 2250)  magnesium sulfate (IV Push/IM) injection (1 g Intravenous Given 03/21/2023 2234)  morphine (PF) 2 MG/ML injection 2 mg (2 mg Intravenous Given 03/20/2023 2247)  IMPRESSION / MDM / ASSESSMENT AND PLAN / ED COURSE  I reviewed the triage vital signs and the nursing notes.   Patient's presentation is most consistent with acute presentation with potential threat to life or bodily function.   Patient comes in with refractory V-fib with prolonged arrest with no evidence of signs of life.  Bedside ultrasound without evidence of effusion.  Discussed with patient's sister who is POA [place].  They had asked EMS to stop as well but they decided to transport patient to the emergency room.  They state that he would not have wanted to continue this and would want him to die peacefully.  Decision was made to end resuscitation based on POA conversation but patient was on comfort. Both sisters at bedside.  They wanted a compassionate withdrawal of the ET tube.  Morphine was given ET tube was removed  patient was monitored.  Pt still had heart rate.   I reevaluated patient and patient had no heart rate.  No pulse was felthe has been eating pupils were unreactive bilaterally.  Time of death was called at 1101 on 04-08-23  Discussed with ME megan not a case   The patient is on the cardiac monitor to evaluate for evidence of arrhythmia and/or significant heart rate changes.      FINAL CLINICAL IMPRESSION(S) / ED DIAGNOSES   Final diagnoses:  Cardiac arrest [facility])     Rx / DC Orders   ED Discharge Orders     None        Note:  This document was prepared using Dragon voice recognition software and may include unintentional dictation errors.   Concha Se, MD 2023-04-08 [phone]

## 2023-03-17 NOTE — Progress Notes (Signed)
 Paged for pt end of life care-family support. Met with family (two sisters) bedside after staff finished cpr. Offered prayer of pt and family per their request. Stayed with them as we waited for pt to pass. Ensured funeral home paperwork was filled out. Offered compassionate presence and listening ear,

## 2023-03-17 NOTE — Addendum Note (Signed)
 Addended by: Malva Limes on: 04/05/2023 08:04 AM   Modules accepted: Orders

## 2023-03-17 NOTE — ED Triage Notes (Signed)
 2228-Patient to room with [facility].  CPR in progress with the [facility] device.  Per EMS patient was a witnessed arrest beginning at approx. 2116.  EMS intubated with a 7.0 tube and held in place with a lock device.  Verified with Capnography.  1-IO placed on right leg.  1 16g EJ placed at left neck.  Per EMS patient was shocked a minimum of 14 times.  Patient was given Amino 450mg , Magnesium 2 gm, EPI X 6, NS 2OOO ml.  Patient remains in Vfib upon arrival.  ACLS continued.

## 2023-03-17 NOTE — Code Documentation (Signed)
 Family updated as to patient's status.

## 2023-03-17 NOTE — Telephone Encounter (Signed)
 Pt deceased

## 2023-03-19 ENCOUNTER — Telehealth: Payer: Self-pay | Admitting: Family Medicine

## 2023-03-19 LAB — CBG MONITORING, ED: Glucose-Capillary: 145 mg/dL — ABNORMAL HIGH (ref 70–99)

## 2023-03-19 NOTE — Telephone Encounter (Signed)
 done

## 2023-03-19 NOTE — Telephone Encounter (Signed)
 Copied from CRM [id number]. Topic: General - Other >> Mar 19, 2023  4:35 PM Higinio Roger wrote: Reason for CRM: Melynda Ripple called to let Dr. Sherrie Mustache know that a death certificate need to be signed for cremation on [place] [facility] system.   Callback #: [id number]

## 2023-03-22 ENCOUNTER — Ambulatory Visit: Payer: PPO | Admitting: Medical

## 2023-04-02 ENCOUNTER — Inpatient Hospital Stay: Payer: PPO | Admitting: Family Medicine

## 2023-04-03 ENCOUNTER — Ambulatory Visit: Admitting: Medical

## 2023-04-10 NOTE — Addendum Note (Signed)
 Addended by: Malva Limes on: 03/18/2023 09:33 AM   Modules accepted: Orders

## 2023-04-10 NOTE — ED Notes (Signed)
 Patient taken to the morgue by Maisie Fus, [employer].

## 2023-04-10 DEATH — deceased

## 2023-05-24 ENCOUNTER — Other Ambulatory Visit: Payer: PPO

## 2023-06-07 ENCOUNTER — Ambulatory Visit: Payer: PPO | Admitting: Internal Medicine

## 2023-06-07 ENCOUNTER — Other Ambulatory Visit: Payer: PPO

## 2023-06-15 ENCOUNTER — Encounter: Payer: Self-pay | Admitting: Family Medicine
